# Patient Record
Sex: Male | Born: 1940 | Race: White | Hispanic: No | Marital: Single | Smoking: Former smoker
Health system: Southern US, Community
[De-identification: ages and names within clinical notes are randomized; demographics above are authoritative.]

## PROBLEM LIST (undated history)

## (undated) DIAGNOSIS — R1310 Dysphagia, unspecified: Secondary | ICD-10-CM

## (undated) DIAGNOSIS — H53039 Strabismic amblyopia, unspecified eye: Secondary | ICD-10-CM

## (undated) DIAGNOSIS — G2581 Restless legs syndrome: Secondary | ICD-10-CM

## (undated) DIAGNOSIS — G904 Autonomic dysreflexia: Secondary | ICD-10-CM

## (undated) DIAGNOSIS — Z8679 Personal history of other diseases of the circulatory system: Secondary | ICD-10-CM

## (undated) DIAGNOSIS — I209 Angina pectoris, unspecified: Secondary | ICD-10-CM

## (undated) DIAGNOSIS — E119 Type 2 diabetes mellitus without complications: Secondary | ICD-10-CM

## (undated) DIAGNOSIS — R609 Edema, unspecified: Secondary | ICD-10-CM

## (undated) DIAGNOSIS — N189 Chronic kidney disease, unspecified: Secondary | ICD-10-CM

## (undated) DIAGNOSIS — G20A1 Parkinson's disease without dyskinesia, without mention of fluctuations: Secondary | ICD-10-CM

## (undated) DIAGNOSIS — J309 Allergic rhinitis, unspecified: Secondary | ICD-10-CM

## (undated) DIAGNOSIS — I2510 Atherosclerotic heart disease of native coronary artery without angina pectoris: Secondary | ICD-10-CM

## (undated) DIAGNOSIS — R569 Unspecified convulsions: Secondary | ICD-10-CM

## (undated) DIAGNOSIS — J449 Chronic obstructive pulmonary disease, unspecified: Secondary | ICD-10-CM

## (undated) DIAGNOSIS — I739 Peripheral vascular disease, unspecified: Secondary | ICD-10-CM

## (undated) DIAGNOSIS — G4730 Sleep apnea, unspecified: Secondary | ICD-10-CM

## (undated) DIAGNOSIS — M549 Dorsalgia, unspecified: Secondary | ICD-10-CM

## (undated) DIAGNOSIS — I499 Cardiac arrhythmia, unspecified: Secondary | ICD-10-CM

## (undated) DIAGNOSIS — M1990 Unspecified osteoarthritis, unspecified site: Secondary | ICD-10-CM

## (undated) DIAGNOSIS — E7800 Pure hypercholesterolemia, unspecified: Secondary | ICD-10-CM

## (undated) DIAGNOSIS — G20 Parkinson's disease: Secondary | ICD-10-CM

## (undated) DIAGNOSIS — G629 Polyneuropathy, unspecified: Secondary | ICD-10-CM

## (undated) DIAGNOSIS — H409 Unspecified glaucoma: Secondary | ICD-10-CM

## (undated) DIAGNOSIS — F32A Depression, unspecified: Secondary | ICD-10-CM

## (undated) DIAGNOSIS — K219 Gastro-esophageal reflux disease without esophagitis: Secondary | ICD-10-CM

## (undated) DIAGNOSIS — M4800 Spinal stenosis, site unspecified: Secondary | ICD-10-CM

## (undated) DIAGNOSIS — F17200 Nicotine dependence, unspecified, uncomplicated: Secondary | ICD-10-CM

## (undated) DIAGNOSIS — H9319 Tinnitus, unspecified ear: Secondary | ICD-10-CM

## (undated) DIAGNOSIS — I219 Acute myocardial infarction, unspecified: Secondary | ICD-10-CM

## (undated) DIAGNOSIS — J189 Pneumonia, unspecified organism: Secondary | ICD-10-CM

## (undated) DIAGNOSIS — I509 Heart failure, unspecified: Secondary | ICD-10-CM

## (undated) DIAGNOSIS — F329 Major depressive disorder, single episode, unspecified: Secondary | ICD-10-CM

## (undated) HISTORY — PX: FRACTURE SURGERY: SHX138

## (undated) HISTORY — PX: OTHER SURGICAL HISTORY: SHX169

## (undated) HISTORY — PX: APPENDECTOMY: SHX54

## (undated) HISTORY — PX: EYE SURGERY: SHX253

## (undated) HISTORY — PX: BACK SURGERY: SHX140

## (undated) HISTORY — PX: CORONARY ANGIOPLASTY: SHX604

## (undated) HISTORY — PX: ANKLE SURGERY: SHX546

---

## 1992-10-23 DIAGNOSIS — L301 Dyshidrosis [pompholyx]: Secondary | ICD-10-CM | POA: Insufficient documentation

## 1998-05-19 DIAGNOSIS — I219 Acute myocardial infarction, unspecified: Secondary | ICD-10-CM

## 1998-05-19 HISTORY — DX: Acute myocardial infarction, unspecified: I21.9

## 2004-03-21 DIAGNOSIS — E7800 Pure hypercholesterolemia, unspecified: Secondary | ICD-10-CM | POA: Insufficient documentation

## 2005-01-17 DIAGNOSIS — G250 Essential tremor: Secondary | ICD-10-CM | POA: Insufficient documentation

## 2005-03-20 DIAGNOSIS — M4802 Spinal stenosis, cervical region: Secondary | ICD-10-CM | POA: Insufficient documentation

## 2008-05-05 DIAGNOSIS — E559 Vitamin D deficiency, unspecified: Secondary | ICD-10-CM | POA: Insufficient documentation

## 2009-08-01 ENCOUNTER — Encounter: Payer: Self-pay | Admitting: Internal Medicine

## 2009-08-13 ENCOUNTER — Inpatient Hospital Stay: Payer: Self-pay | Admitting: Student

## 2009-08-17 ENCOUNTER — Encounter: Payer: Self-pay | Admitting: Internal Medicine

## 2009-09-12 DIAGNOSIS — G9340 Encephalopathy, unspecified: Secondary | ICD-10-CM | POA: Insufficient documentation

## 2010-05-27 ENCOUNTER — Ambulatory Visit: Payer: Self-pay | Admitting: Family Medicine

## 2010-07-24 ENCOUNTER — Ambulatory Visit: Payer: Self-pay | Admitting: Ophthalmology

## 2010-09-15 ENCOUNTER — Inpatient Hospital Stay: Payer: Self-pay | Admitting: Specialist

## 2011-02-25 ENCOUNTER — Ambulatory Visit: Payer: Self-pay | Admitting: Pain Medicine

## 2011-03-05 ENCOUNTER — Ambulatory Visit: Payer: Self-pay | Admitting: Pain Medicine

## 2011-03-12 ENCOUNTER — Ambulatory Visit: Payer: Self-pay | Admitting: Pain Medicine

## 2011-04-08 ENCOUNTER — Ambulatory Visit: Payer: Self-pay | Admitting: Pain Medicine

## 2011-04-16 ENCOUNTER — Ambulatory Visit: Payer: Self-pay | Admitting: Pain Medicine

## 2011-04-28 ENCOUNTER — Inpatient Hospital Stay: Payer: Self-pay | Admitting: Internal Medicine

## 2011-05-27 ENCOUNTER — Ambulatory Visit: Payer: Self-pay | Admitting: Pain Medicine

## 2011-06-16 ENCOUNTER — Ambulatory Visit: Payer: Self-pay | Admitting: Pain Medicine

## 2011-07-15 ENCOUNTER — Ambulatory Visit: Payer: Self-pay | Admitting: Pain Medicine

## 2011-07-24 ENCOUNTER — Ambulatory Visit: Payer: Self-pay | Admitting: Pain Medicine

## 2011-08-11 ENCOUNTER — Ambulatory Visit: Payer: Self-pay | Admitting: Pain Medicine

## 2011-09-11 ENCOUNTER — Ambulatory Visit: Payer: Self-pay | Admitting: Pain Medicine

## 2011-09-29 ENCOUNTER — Ambulatory Visit: Payer: Self-pay | Admitting: Pain Medicine

## 2011-10-03 ENCOUNTER — Ambulatory Visit: Payer: Self-pay | Admitting: Orthopedic Surgery

## 2011-10-03 DIAGNOSIS — E119 Type 2 diabetes mellitus without complications: Secondary | ICD-10-CM

## 2011-10-03 LAB — CBC WITH DIFFERENTIAL/PLATELET
Basophil %: 0.8 %
HCT: 37.8 % — ABNORMAL LOW (ref 40.0–52.0)
HGB: 12.3 g/dL — ABNORMAL LOW (ref 13.0–18.0)
Lymphocyte #: 2.7 10*3/uL (ref 1.0–3.6)
MCH: 28.4 pg (ref 26.0–34.0)
MCV: 88 fL (ref 80–100)
Neutrophil #: 4.8 10*3/uL (ref 1.4–6.5)
Neutrophil %: 54.7 %
Platelet: 240 10*3/uL (ref 150–440)
RBC: 4.31 10*6/uL — ABNORMAL LOW (ref 4.40–5.90)
RDW: 15.4 % — ABNORMAL HIGH (ref 11.5–14.5)
WBC: 8.7 10*3/uL (ref 3.8–10.6)

## 2011-10-03 LAB — BASIC METABOLIC PANEL
BUN: 25 mg/dL — ABNORMAL HIGH (ref 7–18)
Calcium, Total: 8.3 mg/dL — ABNORMAL LOW (ref 8.5–10.1)
Co2: 26 mmol/L (ref 21–32)
Creatinine: 1.25 mg/dL (ref 0.60–1.30)
EGFR (African American): >60
EGFR (Non-African Amer.): 58 — ABNORMAL LOW
Osmolality: 286 (ref 275–301)
Potassium: 4.5 mmol/L (ref 3.5–5.1)

## 2011-10-28 ENCOUNTER — Ambulatory Visit: Payer: Self-pay | Admitting: Orthopedic Surgery

## 2011-11-18 ENCOUNTER — Inpatient Hospital Stay: Payer: Self-pay | Admitting: Internal Medicine

## 2011-11-18 LAB — BASIC METABOLIC PANEL
Anion Gap: 10 (ref 7–16)
Creatinine: 1.48 mg/dL — ABNORMAL HIGH (ref 0.60–1.30)
EGFR (African American): 54 — ABNORMAL LOW
EGFR (Non-African Amer.): 47 — ABNORMAL LOW
Glucose: 173 mg/dL — ABNORMAL HIGH (ref 65–99)
Sodium: 134 mmol/L — ABNORMAL LOW (ref 136–145)

## 2011-11-18 LAB — CBC
HCT: 39.1 % — ABNORMAL LOW (ref 40.0–52.0)
HGB: 12.6 g/dL — ABNORMAL LOW (ref 13.0–18.0)
MCH: 28.3 pg (ref 26.0–34.0)
MCV: 88 fL (ref 80–100)
RBC: 4.44 10*6/uL (ref 4.40–5.90)
WBC: 13.7 10*3/uL — ABNORMAL HIGH (ref 3.8–10.6)

## 2011-11-18 LAB — TROPONIN I: Troponin-I: <0.02 ng/mL

## 2011-11-19 LAB — LIPID PANEL
Cholesterol: 88 mg/dL (ref 0–200)
Ldl Cholesterol, Calc: 10 mg/dL (ref 0–100)
Triglycerides: 260 mg/dL — ABNORMAL HIGH (ref 0–200)
VLDL Cholesterol, Calc: 52 mg/dL — ABNORMAL HIGH (ref 5–40)

## 2011-11-19 LAB — BASIC METABOLIC PANEL
Anion Gap: 6 — ABNORMAL LOW (ref 7–16)
BUN: 16 mg/dL (ref 7–18)
Calcium, Total: 8.5 mg/dL (ref 8.5–10.1)
Co2: 30 mmol/L (ref 21–32)
Creatinine: 1.28 mg/dL (ref 0.60–1.30)
EGFR (Non-African Amer.): 56 — ABNORMAL LOW
Potassium: 3.9 mmol/L (ref 3.5–5.1)
Sodium: 138 mmol/L (ref 136–145)

## 2011-11-19 LAB — CBC WITH DIFFERENTIAL/PLATELET
Basophil %: 0.8 %
Eosinophil %: 1.8 %
Lymphocyte %: 27.6 %
MCH: 28.5 pg (ref 26.0–34.0)
MCV: 87 fL (ref 80–100)
Monocyte #: 1.3 x10 3/mm — ABNORMAL HIGH (ref 0.2–1.0)
Monocyte %: 15 %
Neutrophil %: 54.8 %
Platelet: 189 10*3/uL (ref 150–440)

## 2011-11-19 LAB — TSH: Thyroid Stimulating Horm: 2.65 u[IU]/mL

## 2011-11-20 LAB — BASIC METABOLIC PANEL
Anion Gap: 7 (ref 7–16)
BUN: 15 mg/dL (ref 7–18)
Calcium, Total: 8.6 mg/dL (ref 8.5–10.1)
Chloride: 105 mmol/L (ref 98–107)
Co2: 29 mmol/L (ref 21–32)
Creatinine: 1.31 mg/dL — ABNORMAL HIGH (ref 0.60–1.30)
EGFR (African American): >60
EGFR (Non-African Amer.): 54 — ABNORMAL LOW
Osmolality: 283 (ref 275–301)

## 2011-11-20 LAB — HEMOGLOBIN A1C: Hemoglobin A1C: 6.9 % — ABNORMAL HIGH (ref 4.2–6.3)

## 2011-11-20 LAB — HEMOGLOBIN: HGB: 11.6 g/dL — ABNORMAL LOW (ref 13.0–18.0)

## 2011-11-22 LAB — BASIC METABOLIC PANEL
Calcium, Total: 9.3 mg/dL (ref 8.5–10.1)
Co2: 24 mmol/L (ref 21–32)
EGFR (Non-African Amer.): 56 — ABNORMAL LOW
Glucose: 243 mg/dL — ABNORMAL HIGH (ref 65–99)
Osmolality: 286 (ref 275–301)
Potassium: 4.2 mmol/L (ref 3.5–5.1)

## 2011-11-22 LAB — EXPECTORATED SPUTUM ASSESSMENT W GRAM STAIN, RFLX TO RESP C

## 2011-11-24 LAB — CULTURE, BLOOD (SINGLE)

## 2011-11-27 ENCOUNTER — Ambulatory Visit: Payer: Self-pay | Admitting: Pain Medicine

## 2011-12-22 ENCOUNTER — Ambulatory Visit: Payer: Self-pay | Admitting: Pain Medicine

## 2012-01-22 ENCOUNTER — Ambulatory Visit: Payer: Self-pay | Admitting: Pain Medicine

## 2012-02-02 ENCOUNTER — Ambulatory Visit: Payer: Self-pay | Admitting: Pain Medicine

## 2012-10-01 ENCOUNTER — Ambulatory Visit: Payer: Self-pay | Admitting: Family

## 2013-03-08 ENCOUNTER — Ambulatory Visit: Payer: Self-pay | Admitting: Pain Medicine

## 2013-03-28 ENCOUNTER — Ambulatory Visit: Payer: Self-pay | Admitting: Pain Medicine

## 2013-04-05 ENCOUNTER — Observation Stay: Payer: Self-pay | Admitting: Internal Medicine

## 2013-04-05 LAB — TROPONIN I: Troponin-I: <0.02 ng/mL

## 2013-04-05 LAB — BASIC METABOLIC PANEL
Anion Gap: 5 — ABNORMAL LOW (ref 7–16)
BUN: 20 mg/dL — ABNORMAL HIGH (ref 7–18)
Calcium, Total: 8.3 mg/dL — ABNORMAL LOW (ref 8.5–10.1)
Chloride: 102 mmol/L (ref 98–107)
Co2: 27 mmol/L (ref 21–32)
Creatinine: 1.22 mg/dL (ref 0.60–1.30)
Osmolality: 276 (ref 275–301)

## 2013-04-05 LAB — CBC
HGB: 12.5 g/dL — ABNORMAL LOW (ref 13.0–18.0)
MCH: 27.3 pg (ref 26.0–34.0)
MCV: 83 fL (ref 80–100)
RBC: 4.58 10*6/uL (ref 4.40–5.90)

## 2013-04-05 LAB — PRO B NATRIURETIC PEPTIDE: B-Type Natriuretic Peptide: 82 pg/mL (ref 0–125)

## 2013-04-06 LAB — TROPONIN I: Troponin-I: <0.02 ng/mL

## 2013-04-22 ENCOUNTER — Emergency Department: Payer: Self-pay | Admitting: Emergency Medicine

## 2013-04-22 LAB — BASIC METABOLIC PANEL
BUN: 18 mg/dL (ref 7–18)
Calcium, Total: 8.8 mg/dL (ref 8.5–10.1)
Chloride: 102 mmol/L (ref 98–107)
Co2: 28 mmol/L (ref 21–32)
EGFR (African American): >60
Glucose: 215 mg/dL — ABNORMAL HIGH (ref 65–99)

## 2013-04-22 LAB — CBC
HCT: 39.7 % — ABNORMAL LOW (ref 40.0–52.0)
HGB: 13 g/dL (ref 13.0–18.0)
MCH: 27.3 pg (ref 26.0–34.0)
MCHC: 32.7 g/dL (ref 32.0–36.0)
MCV: 84 fL (ref 80–100)
Platelet: 218 10*3/uL (ref 150–440)
RBC: 4.75 10*6/uL (ref 4.40–5.90)
WBC: 8.6 10*3/uL (ref 3.8–10.6)

## 2013-04-22 LAB — CK: CK, Total: 105 U/L (ref 35–232)

## 2013-04-22 LAB — TROPONIN I: Troponin-I: <0.02 ng/mL

## 2013-04-28 ENCOUNTER — Ambulatory Visit: Payer: Self-pay | Admitting: Pain Medicine

## 2013-05-16 ENCOUNTER — Ambulatory Visit: Payer: Self-pay | Admitting: Pain Medicine

## 2013-07-11 DIAGNOSIS — Z955 Presence of coronary angioplasty implant and graft: Secondary | ICD-10-CM | POA: Insufficient documentation

## 2013-07-26 ENCOUNTER — Encounter: Payer: Self-pay | Admitting: Cardiology

## 2013-08-15 ENCOUNTER — Inpatient Hospital Stay: Payer: Self-pay | Admitting: Specialist

## 2013-08-15 LAB — CBC
HCT: 43.3 % (ref 40.0–52.0)
HGB: 13.6 g/dL (ref 13.0–18.0)
MCH: 26.5 pg (ref 26.0–34.0)
MCHC: 31.5 g/dL — AB (ref 32.0–36.0)
MCV: 84 fL (ref 80–100)
Platelet: 270 10*3/uL (ref 150–440)
RBC: 5.14 10*6/uL (ref 4.40–5.90)
RDW: 15.4 % — ABNORMAL HIGH (ref 11.5–14.5)
WBC: 11.7 10*3/uL — ABNORMAL HIGH (ref 3.8–10.6)

## 2013-08-15 LAB — BASIC METABOLIC PANEL
Anion Gap: 6 — ABNORMAL LOW (ref 7–16)
BUN: 14 mg/dL (ref 7–18)
CHLORIDE: 100 mmol/L (ref 98–107)
CO2: 28 mmol/L (ref 21–32)
CREATININE: 1.27 mg/dL (ref 0.60–1.30)
Calcium, Total: 9 mg/dL (ref 8.5–10.1)
EGFR (Non-African Amer.): 56 — ABNORMAL LOW
GLUCOSE: 297 mg/dL — AB (ref 65–99)
Osmolality: 280 (ref 275–301)
Potassium: 4.5 mmol/L (ref 3.5–5.1)
SODIUM: 134 mmol/L — AB (ref 136–145)

## 2013-08-15 LAB — TROPONIN I
TROPONIN-I: 0.05 ng/mL
Troponin-I: <0.02 ng/mL

## 2013-08-16 LAB — CBC WITH DIFFERENTIAL/PLATELET
BASOS ABS: 0.1 10*3/uL (ref 0.0–0.1)
Basophil %: 1.3 %
EOS PCT: 3.9 %
Eosinophil #: 0.4 10*3/uL (ref 0.0–0.7)
HCT: 35.6 % — AB (ref 40.0–52.0)
HGB: 11.6 g/dL — ABNORMAL LOW (ref 13.0–18.0)
Lymphocyte #: 3.8 10*3/uL — ABNORMAL HIGH (ref 1.0–3.6)
Lymphocyte %: 39.5 %
MCH: 27 pg (ref 26.0–34.0)
MCHC: 32.5 g/dL (ref 32.0–36.0)
MCV: 83 fL (ref 80–100)
MONO ABS: 1 x10 3/mm (ref 0.2–1.0)
Monocyte %: 10 %
NEUTROS ABS: 4.4 10*3/uL (ref 1.4–6.5)
Neutrophil %: 45.3 %
Platelet: 233 10*3/uL (ref 150–440)
RBC: 4.29 10*6/uL — ABNORMAL LOW (ref 4.40–5.90)
RDW: 15.5 % — AB (ref 11.5–14.5)
WBC: 9.6 10*3/uL (ref 3.8–10.6)

## 2013-08-16 LAB — BASIC METABOLIC PANEL
Anion Gap: 6 — ABNORMAL LOW (ref 7–16)
BUN: 18 mg/dL (ref 7–18)
CALCIUM: 8.3 mg/dL — AB (ref 8.5–10.1)
Chloride: 102 mmol/L (ref 98–107)
Co2: 27 mmol/L (ref 21–32)
Creatinine: 1.28 mg/dL (ref 0.60–1.30)
EGFR (African American): >60
EGFR (Non-African Amer.): 55 — ABNORMAL LOW
Glucose: 244 mg/dL — ABNORMAL HIGH (ref 65–99)
OSMOLALITY: 280 (ref 275–301)
POTASSIUM: 3.6 mmol/L (ref 3.5–5.1)
Sodium: 135 mmol/L — ABNORMAL LOW (ref 136–145)

## 2013-08-16 LAB — LIPID PANEL
Cholesterol: 131 mg/dL (ref 0–200)
HDL Cholesterol: 19 mg/dL — ABNORMAL LOW (ref 40–60)
TRIGLYCERIDES: 597 mg/dL — AB (ref 0–200)

## 2013-08-17 ENCOUNTER — Encounter: Payer: Self-pay | Admitting: Cardiology

## 2013-09-16 ENCOUNTER — Encounter: Payer: Self-pay | Admitting: Cardiology

## 2013-10-17 ENCOUNTER — Encounter: Payer: Self-pay | Admitting: Cardiology

## 2013-12-07 ENCOUNTER — Inpatient Hospital Stay: Payer: Self-pay | Admitting: Specialist

## 2013-12-07 LAB — URINALYSIS, COMPLETE
BACTERIA: NONE SEEN
BILIRUBIN, UR: NEGATIVE
BLOOD: NEGATIVE
Glucose,UR: >=500 mg/dL (ref 0–75)
LEUKOCYTE ESTERASE: NEGATIVE
Nitrite: NEGATIVE
Ph: 7 (ref 4.5–8.0)
Protein: NEGATIVE
RBC,UR: 1 /HPF (ref 0–5)
Specific Gravity: 1.015 (ref 1.003–1.030)
Squamous Epithelial: NONE SEEN

## 2013-12-07 LAB — CBC WITH DIFFERENTIAL/PLATELET
BASOS ABS: 0.1 10*3/uL (ref 0.0–0.1)
Basophil %: 0.5 %
Eosinophil #: 0.1 10*3/uL (ref 0.0–0.7)
Eosinophil %: 0.4 %
HCT: 35.5 % — AB (ref 40.0–52.0)
HGB: 11.1 g/dL — ABNORMAL LOW (ref 13.0–18.0)
LYMPHS ABS: 2.5 10*3/uL (ref 1.0–3.6)
Lymphocyte %: 17.5 %
MCH: 25.8 pg — ABNORMAL LOW (ref 26.0–34.0)
MCHC: 31.4 g/dL — AB (ref 32.0–36.0)
MCV: 82 fL (ref 80–100)
MONO ABS: 1.7 x10 3/mm — AB (ref 0.2–1.0)
MONOS PCT: 11.8 %
Neutrophil #: 10.1 10*3/uL — ABNORMAL HIGH (ref 1.4–6.5)
Neutrophil %: 69.8 %
PLATELETS: 218 10*3/uL (ref 150–440)
RBC: 4.32 10*6/uL — AB (ref 4.40–5.90)
RDW: 15.9 % — AB (ref 11.5–14.5)
WBC: 14.5 10*3/uL — ABNORMAL HIGH (ref 3.8–10.6)

## 2013-12-07 LAB — BASIC METABOLIC PANEL
Anion Gap: 8 (ref 7–16)
BUN: 8 mg/dL (ref 7–18)
CHLORIDE: 106 mmol/L (ref 98–107)
CO2: 26 mmol/L (ref 21–32)
Calcium, Total: 8.3 mg/dL — ABNORMAL LOW (ref 8.5–10.1)
Creatinine: 0.98 mg/dL (ref 0.60–1.30)
EGFR (African American): >60
EGFR (Non-African Amer.): >60
Glucose: 100 mg/dL — ABNORMAL HIGH (ref 65–99)
Osmolality: 278 (ref 275–301)
POTASSIUM: 3.7 mmol/L (ref 3.5–5.1)
SODIUM: 140 mmol/L (ref 136–145)

## 2013-12-07 LAB — HEPATIC FUNCTION PANEL A (ARMC)
ALK PHOS: 57 U/L
AST: 23 U/L (ref 15–37)
Albumin: 3.1 g/dL — ABNORMAL LOW (ref 3.4–5.0)
Bilirubin, Direct: 0.1 mg/dL (ref 0.00–0.20)
Bilirubin,Total: 0.7 mg/dL (ref 0.2–1.0)
SGPT (ALT): 28 U/L
TOTAL PROTEIN: 6.9 g/dL (ref 6.4–8.2)

## 2013-12-07 LAB — D-DIMER(ARMC): D-Dimer: 535 ng/ml

## 2013-12-07 LAB — TROPONIN I: Troponin-I: <0.02 ng/mL

## 2013-12-08 LAB — CBC WITH DIFFERENTIAL/PLATELET
Basophil #: 0.1 10*3/uL (ref 0.0–0.1)
Basophil %: 0.7 %
EOS PCT: 0.6 %
Eosinophil #: 0.1 10*3/uL (ref 0.0–0.7)
HCT: 35.4 % — ABNORMAL LOW (ref 40.0–52.0)
HGB: 11 g/dL — AB (ref 13.0–18.0)
Lymphocyte #: 3.1 10*3/uL (ref 1.0–3.6)
Lymphocyte %: 20.9 %
MCH: 25.5 pg — ABNORMAL LOW (ref 26.0–34.0)
MCHC: 31.1 g/dL — AB (ref 32.0–36.0)
MCV: 82 fL (ref 80–100)
MONOS PCT: 12.7 %
Monocyte #: 1.9 x10 3/mm — ABNORMAL HIGH (ref 0.2–1.0)
NEUTROS ABS: 9.6 10*3/uL — AB (ref 1.4–6.5)
Neutrophil %: 65.1 %
PLATELETS: 210 10*3/uL (ref 150–440)
RBC: 4.31 10*6/uL — ABNORMAL LOW (ref 4.40–5.90)
RDW: 16.2 % — ABNORMAL HIGH (ref 11.5–14.5)
WBC: 14.7 10*3/uL — AB (ref 3.8–10.6)

## 2013-12-08 LAB — BASIC METABOLIC PANEL
ANION GAP: 8 (ref 7–16)
BUN: 12 mg/dL (ref 7–18)
CHLORIDE: 103 mmol/L (ref 98–107)
CREATININE: 1.16 mg/dL (ref 0.60–1.30)
Calcium, Total: 8.2 mg/dL — ABNORMAL LOW (ref 8.5–10.1)
Co2: 27 mmol/L (ref 21–32)
EGFR (Non-African Amer.): >60
GLUCOSE: 203 mg/dL — AB (ref 65–99)
OSMOLALITY: 281 (ref 275–301)
Potassium: 3.9 mmol/L (ref 3.5–5.1)
Sodium: 138 mmol/L (ref 136–145)

## 2013-12-09 LAB — CBC WITH DIFFERENTIAL/PLATELET
BASOS PCT: 0.8 %
Basophil #: 0.1 10*3/uL (ref 0.0–0.1)
EOS ABS: 0.1 10*3/uL (ref 0.0–0.7)
Eosinophil %: 1.1 %
HCT: 35.9 % — ABNORMAL LOW (ref 40.0–52.0)
HGB: 11.2 g/dL — ABNORMAL LOW (ref 13.0–18.0)
LYMPHS ABS: 2.7 10*3/uL (ref 1.0–3.6)
Lymphocyte %: 22.1 %
MCH: 25.7 pg — ABNORMAL LOW (ref 26.0–34.0)
MCHC: 31.1 g/dL — AB (ref 32.0–36.0)
MCV: 83 fL (ref 80–100)
MONOS PCT: 13.2 %
Monocyte #: 1.6 x10 3/mm — ABNORMAL HIGH (ref 0.2–1.0)
NEUTROS ABS: 7.7 10*3/uL — AB (ref 1.4–6.5)
Neutrophil %: 62.8 %
Platelet: 221 10*3/uL (ref 150–440)
RBC: 4.34 10*6/uL — ABNORMAL LOW (ref 4.40–5.90)
RDW: 16.7 % — ABNORMAL HIGH (ref 11.5–14.5)
WBC: 12.3 10*3/uL — ABNORMAL HIGH (ref 3.8–10.6)

## 2013-12-12 LAB — CULTURE, BLOOD (SINGLE)

## 2014-06-13 ENCOUNTER — Ambulatory Visit: Payer: Self-pay | Admitting: Family

## 2014-08-07 ENCOUNTER — Ambulatory Visit: Payer: Self-pay | Admitting: Ophthalmology

## 2014-08-29 ENCOUNTER — Ambulatory Visit
Admit: 2014-08-29 | Disposition: A | Discharge status: Home / Self Care | Payer: Self-pay | Attending: Family | Admitting: Family

## 2014-09-08 NOTE — Discharge Summary (Signed)
PATIENT NAME:  Jimmy Hill, Jimmy MR#:  [id number] DATE OF BIRTH:  Jan 04, 1941  DATE OF ADMISSION:  04/05/2013 DATE OF DISCHARGE:  04/06/2013  DISCHARGE DIAGNOSIS: Chest pain, likely noncardiac with negative serial cardiac enzymes. It had more pleuritic nature and/or muscular etiology. Will require outpatient stress test if felt appropriate by his cardiologist.   SECONDARY DIAGNOSES: 1.  Coronary artery disease status post stenting. 2.  Diabetes.  3.  Hypertension. 4.  Chronic obstructive pulmonary disease.  5.  Parkinson disease.  6.  Depression. 7.  Hyperlipidemia.  8.  Glaucoma.  9.  Chronic back pain.   CONSULTATIONS: None.   PROCEDURES AND RADIOLOGY: CT scan of the chest with PE protocol on 11/18 showed no evidence of PE. Coronary calcification. Possible chronic scarring at the bases at the right lower lobe.   Chest x-ray on 11/18 showed no acute cardiopulmonary disease.   HISTORY AND SHORT HOSPITAL COURSE: The patient is a 74 year old male with the above-mentioned medical problems, who was admitted for chest pain. Please see Dr. Edward Jolly dictated history and physical for further details. The patient was ruled out with 3 negative sets of troponin. His chest pain was more consistent with more muscular or pleuritic etiology/pleurisy, and was instructed to follow up with his outpatient cardiology at [facility], when he could be considered for stress test. He was also instructed to use nonsteroidal anti-inflammatory medication if okay with his primary care physician, for his pain. The patient's chest pain was significantly improved on the date of discharge, and he was discharged home in stable condition. On the date of discharge, his vital signs were as follows: Temperature 97.7, heart rate 84 per minute, respirations 20 per minute, blood pressure 111/72 mmHg. He was saturating 93% to 94% on room air.   PERTINENT PHYSICAL EXAMINATION ON THE DATE OF DISCHARGE:  CARDIOVASCULAR: S1, S2 normal. No  murmurs, rubs or gallops.  LUNGS: Clear to auscultation bilaterally. No wheezing, rales, rhonchi or crepitation.  ABDOMEN: Soft, benign.  NEUROLOGIC: Nonfocal examination.  All other physical examination remained at baseline.   DISCHARGE MEDICATIONS: 1.  Glipizide 5 mg p.o. daily. 2.  Neurontin 800 mg p.o. 3 times a day.  3.  Lipitor 80 mg p.o. at bedtime.  4.  Hydrocortisone topical 2.5% to affected area twice a day as needed.  5.  Isosorbide dinitrate 10 mg 1/2 tablet p.o. b.i.d.  6.  Optive eye drops 1 to 2 drops to each eye 3 times a day as needed.  7.  Metformin 1000 mg 2 tablets p.o. daily.  8.  Wellbutrin 300 mg p.o. daily. 9.  Nystatin/triamcinolone topically to affected area twice a day.  10.  Chloraseptic spray to throat area every two hours as needed.  11.  Lumigan 0.01% ophthalmic solution to each eye at bedtime.  12.  Mucinex 600 mg p.o. b.i.d. as needed.  13.  DuoNeb inhaled 4 times a day.  14.  Sinemet 25/100, 1 tablet p.o. 4 times a day.  15.  Imodium 2 tablets p.o. after each loose stool and then 1 tablet every six hours as needed.  16.  Ibuprofen 400 mg p.o. every six hours as needed.   DISCHARGE DIET: Low sodium.   DISCHARGE ACTIVITY: As tolerated.   DISCHARGE INSTRUCTIONS AND FOLLOW-UP: The patient was instructed to follow up with his primary care physician at [facility] in one to two days. He will need follow-up with [facility] cardiology in 1 to 2 weeks.   I did discuss this discharge plan  with his primary care physician at [facility], and they were in agreement with the discharge plan.   TOTAL TIME DISCHARGING THIS PATIENT: 45 minutes.   ____________________________ Lucina Mellow. Manuella Ghazi, MD vss:cg D: 04/06/2013 21:51:10 ET T: 04/06/2013 22:22:15 ET JOB#: [id number]  cc: Adylynn Hertenstein S. Manuella Ghazi, MD, <Dictator> [facility] Lucina Mellow [facility] MD ELECTRONICALLY SIGNED 04/07/2013 16:24

## 2014-09-08 NOTE — H&P (Signed)
PATIENT NAME:  Jimmy Hill, Jimmy Hill MR#:  [id number] DATE OF BIRTH:  March 05, 1941  DATE OF ADMISSION:  04/05/2013  PRIMARY CARE PHYSICIAN: Danelle Berry. Derrek Monaco, MD, at [employer].   CHIEF COMPLAINT: Chest pain.   HISTORY OF PRESENT ILLNESS: This is a 74 year old male who presents to the hospital with substernal chest pain that began earlier this evening. The patient describes the pain as being sharp but pressure-like in nature, located in the left side of his chest, nonradiating, associated with some nausea and some shortness of breath and diaphoresis. The patient says his pain was very similar to when he had his heart attack about 10+ years ago. The patient, therefore, came to the ER for further evaluation. The patient does have a history of chronic pain, is followed by the pain clinic by Dr. Primus Bravo but that is more for his back and shoulder pain but not chest pain. The patient does have significant risk factors given previous history of coronary artery disease, diabetes, hyperlipidemia, history of long-term tobacco abuse. His symptoms were suspicious for angina and, therefore, hospitalist services were contacted for further treatment and evaluation.   REVIEW OF SYSTEMS:  CONSTITUTIONAL: No documented fever. No weight gain, no weight loss.  EYES: No blurred or double vision.  ENT: No tinnitus. No postnasal drip. No redness of the oropharynx.  RESPIRATORY: No cough, no wheeze, no hemoptysis, no dyspnea. Positive COPD.  CARDIOVASCULAR: Positive chest pain. No orthopnea. No palpitations. No syncope.  GASTROINTESTINAL: No nausea, no vomiting, no diarrhea. No abdominal pain. No melena or hematochezia.  GENITOURINARY: No dysuria or hematuria.  ENDOCRINE: No polyuria, nocturia, heat or cold intolerance.  HEMATOLOGIC: No anemia. No bruising. No bleeding.  INTEGUMENTARY: No rashes. No lesions.  MUSCULOSKELETAL: No arthritis, no swelling, no gout.  NEUROLOGIC: No numbness or tingling. No ataxia. No  seizure-type activity. Positive Parkinson's disease.  PSYCHIATRIC: Positive depression. No anxiety. No ADD.   PAST MEDICAL HISTORY: Consistent with a history of coronary artery disease, status post stent placement, diabetes, hypertension, COPD, history of Parkinson's disease, depression, hyperlipidemia, glaucoma, chronic back pain.   ALLERGIES: No known drug allergies.   SOCIAL HISTORY: Does have a long-term tobacco abuse history, about 40 to 50 years but quit about a year or so ago. No alcohol abuse. No illicit drug abuse. Lives at home with his wife.   FAMILY HISTORY: Both mother and father are deceased. Father died from complications of heart disease. Mother died from old age.   CURRENT MEDICATIONS: Advair 250/50 one puff b.i.d.,  cetirizine in 10 mg daily as needed, Chloraseptic spray 4 times daily as needed, DuoNebs 4 times daily as needed, Flonase 1 spray to each nostril daily, glipizide 5 mg daily, hydrocortisone topical 2.5% to be applied b.i.d. as needed, Imodium AD 2 tabs 4 times daily as needed for diarrhea, isosorbide dinitrate 10 mg 1/2 tab b.i.d., Lipitor 80 mg daily, Lumigan 0.01% ophthalmic solution at bedtime, metformin 2000 mg daily, Mucinex 600 mg b.i.d., Neurontin 800 mg t.i.d., nystatin/triamcinolone topical ointment to be applied b.i.d., Sinemet 25/100 one tab 4 times daily, Spiriva 1 puff daily and Wellbutrin 300 mg daily.   PHYSICAL EXAMINATION: Presently is as follows:  VITAL SIGNS:  Temperature is 97.8, pulse 83, respirations 16, blood pressure 140/79, sats 96% on room air.  GENERAL: He is a pleasant-appearing male in no apparent distress.  HEAD, EYES, EARS, NOSE THROAT: He is atraumatic, normocephalic. His extraocular muscles are intact. His pupils are equal and reactive and reactive to light. His sclerae  is anicteric. No conjunctival injection. No oropharyngeal erythema.  NECK: Supple. There is no jugular venous distention. No bruits. No lymphadenopathy or thyromegaly.   HEART: Regular rate and rhythm. No murmurs. No rubs. No clicks.  LUNGS: He has prolonged inspiratory and expiratory phase. Positive end-expiratory wheezing. No rales, no rhonchi. Negative use of accessory muscles.  ABDOMEN: Soft, flat, nontender, nondistended. Has good bowel sounds. No hepatosplenomegaly appreciated.  EXTREMITIES: No evidence of any cyanosis, clubbing or peripheral edema. Has +2 pedal and radial pulses bilaterally.  NEUROLOGICAL: The patient is alert, awake, and oriented x 3 with no focal motor or sensory deficits appreciated bilaterally.  SKIN: Moist and warm with no rashes appreciated.  LYMPHATIC: There is no cervical or axillary adenopathy.   LABORATORY DATA: Serum glucose 185, BUN 20, creatinine 1.2, sodium 134, potassium 4.2, chloride 102, bicarbonate 27. Troponin less than 0.02. White cell count 10.9, hemoglobin 12.5, hematocrit 38.2, platelet count 229.   IMAGING:  The patient did have a chest x-ray done which showed no acute cardiopulmonary disease. The patient also underwent a CT scan of the chest with contrast, showed no evidence of any acute pulmonary embolism. The patient also had a pleural-based opacity in the posterior aspect of the right lower lobe representing a small area of airspace consolidation, atherosclerosis and 3-vessel coronary disease.   ASSESSMENT AND PLAN: This is a 74 year old male with a history of coronary artery disease, status post stent, diabetes, hypertension, chronic obstructive pulmonary disease, history of Parkinson's disease, depression, hyperlipidemia, glaucoma and chronic back pain, who presents to the hospital with chest pain.  1.  Chest pain. The patient does have significant risk factors given his history of diabetes, hypertension, history of coronary artery disease and long-term tobacco abuse. I will, therefore, observe the patient overnight on telemetry, follow serial cardiac markers. His first set of markers are negative. His EKG is  nonspecific I will continue his aspirin, statin and isosorbide. The patient follows up with cardiology at [facility]. If his cardiac markers are negative and he is asymptomatic, he can get a stress test done as an outpatient through his cardiologist at [facility].  2.  Diabetes. The patient's blood sugars are stable. Continue his metformin and glipizide, and place him on a carb-controlled diet.  3.  Chronic obstructive pulmonary disease. No evidence of an acute chronic obstructive pulmonary disease exacerbation. Continue his Advair and Spiriva.  3.  History of Parkinson's. Continue Sinemet.   5.  Hyperlipidemia. Continue Lipitor.  6.  Depression. Continue Wellbutrin.  7.  Glaucoma. Continue Lumigan eye drops.  8.  Diabetic neuropathy. Continue Neurontin.  9.  CODE STATUS: The patient is a full code.   TIME SPENT ON ADMISSION: 45 minutes.     ____________________________ Belia Heman. Verdell Carmine, MD vjs:cs D: 04/05/2013 19:24:32 ET T: 04/05/2013 19:48:04 ET JOB#: [id number]  cc: Belia Heman. Verdell Carmine, MD, <Dictator> Henreitta Leber MD ELECTRONICALLY SIGNED 04/06/2013 13:49

## 2014-09-09 NOTE — Discharge Summary (Signed)
PATIENT NAME:  Jimmy Hill, Jimmy Hill MR#:  [id number] DATE OF BIRTH:  [birth year]  DATE OF ADMISSION:  12/07/2013 DATE OF DISCHARGE:  12/09/2013   For a detailed note, please look at the history and physical done on admission by Dr. Lunette Stands.   DIAGNOSES AT DISCHARGE: As follows: 1. Pneumonia, likely community acquired.  2. Generalized weakness and frequent falls.  3. Parkinson's disease.  4. Diabetes.  5. Hypertension.  6. Hyperlipidemia.  7. History of coronary artery disease.   DIET: The patient is being discharged on a low-sodium, low-fat, American Diabetic Association diet.   ACTIVITY: As tolerated.   FOLLOWUP: With Dr. Dorthea Cove at the [facility] in the next 1 to 2 weeks.   DISCHARGE MEDICATIONS:  1. Lipitor 80 mg at bedtime.  2. Metformin 1000 mg 2 tabs daily. 3. Wellbutrin 300 mg daily.  4. Sinemet 25/100 one tab 4 times daily.  5. Neurontin 800 mg in the morning and 1600 mg in the evening. 6. Protonix 40 mg daily. 7. Plavix 75 mg daily.  8. Vitamin B12 1000 mcg 1 tab daily.  9. Aspirin 81 mg daily. 10. Calcium with vitamin D 1 tab b.i.d. 11. Glipizide 10 mg b.i.d.  12. Levaquin 500 mg daily for 5 days.   PERTINENT STUDIES DONE DURING THE HOSPITAL COURSE: Are as follows:  A CT scan of the head done without contrast showing no acute intracranial process. 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  A chest x-ray done on admission showing minimal right basilar airspace opacity, may reflect atelectasis or possible mild pneumonia.  Blood cultures noted to be negative.   HOSPITAL COURSE: This is a 74 year old male who presented to the hospital due to generalized weakness, frequent falls and also noted to have a cough with suspected pneumonia.   1. Pneumonia. This was likely the cause of the patient's cough and generalized weakness. The patient was treated with IV ceftriaxone and Zithromax and has clinically improved. He is currently being  discharged on oral Levaquin. His blood cultures have remained negative, and he is clinically feeling much better.  2. Generalized weakness and frequent falls. This was likely secondary to underlying pneumonia combined with deconditioning and possible underlying Parkinson's disease. The patient has been treated adequately for his pneumonia, but he continues to be weak. Physical therapy consult was obtained. They thought he would benefit from short-term rehab, which is where presently he is being discharged.  3. History of coronary artery disease. The patient has no chest pain, no acute cardiac issues. He will continue his aspirin, Plavix and statin.  4. Diabetes. The patient was maintained on some sliding scale insulin, but will resume his glipizide and metformin upon discharge.  5. Diabetic neuropathy. The patient was maintained on his Neurontin. He will resume that upon discharge.   CODE STATUS: The patient is a full code.   TIME SPENT ON DISCHARGE: 40 minutes.   ____________________________ Belia Heman. Verdell Carmine, MD vjs:lb D: 12/09/2013 13:58:56 ET T: 12/09/2013 14:19:47 ET JOB#: [id number]  cc: Belia Heman. Verdell Carmine, MD, <Dictator> Danelle Berry. Derrek Monaco, MD Henreitta Leber MD ELECTRONICALLY SIGNED 12/15/2013 14:07

## 2014-09-09 NOTE — H&P (Signed)
PATIENT NAME:  Jimmy Hill, Jimmy Hill MR#:  [id number] DATE OF BIRTH:  Jan 11, 1941  DATE OF ADMISSION:  08/15/2013  PRIMARY CARE PHYSICIAN: Dr. Dorthea Cove at [facility].   REFERRING EMERGENCY ROOM PHYSICIAN: Dr. Karma Greaser.   CHIEF COMPLAINT: Chest pain.   HISTORY OF PRESENT ILLNESS: The patient is a 74 year old male who has past medical history of coronary artery disease and status post stent placement. He had some chest pain while working on snow in February 2015 and went to [facility] where his cardiologist is, and he had angiogram done over there and found one of his stents was collapsed and so it was replaced. Initially, he had 4 stents placed many years ago, and since then he has been following with cardiologist at [facility]. Other history: He has diabetes, hypertension, COPD, Parkinson disease, hyperlipidemia. He has been taking his medications regularly. Today morning, when he was doing some exercise moving some stuff from his truck, he started having chest pain, which was on the left side of his chest, pressure-like and tightness, was 8 out of 10, nonradiating,  so he came to the Emergency Room, received some nitroglycerin tablets and with that his pain came down to 3 out of 10, but that also dropped his blood pressure to 81/50, and so he was given some IV fluids by ER physician and given for admission to hospitalist team. Before I saw the patient,  his blood pressure responded to IV fluid bolus, and so the ER physician, because the pain was still 3 out of 10, started him on nitroglycerin drip. Did the nitroglycerin drip just for a few minutes, helped, and his pain totally resolved, so drip was discontinued, so when I saw the patient, the patient did not have any chest pain. On further questioning, he denied any complaint of cough or sputum production or any fever or palpitation.   REVIEW OF SYSTEMS:  CONSTITUTIONAL: No fever or weight gain, weight loss or fatigue.  EYES: No blurring  or double vision.  EARS, NOSE, THROAT: No tinnitus, ear pain or hearing loss.  RESPIRATORY: No cough, wheezing, hemoptysis or shortness of breath.  CARDIOVASCULAR: The patient had chest pain which responded to nitroglycerin tablet and then nitroglycerin IV drip. No palpitation or dizziness or vertigo. GASTROINTESTINAL: No nausea, vomiting, diarrhea or abdominal pain.  GENITOURINARY: No dysuria, hematuria or increased frequency.  ENDOCRINE: No heat or cold intolerance. No increased sweating.  SKIN: No acne, rashes or lesions.  MUSCULOSKELETAL: No pain or swelling in the joints.  NEUROLOGICAL: No numbness, weakness, tremor or vertigo.  PSYCHIATRIC: No anxiety, insomnia or bipolar disorder.   PAST MEDICAL HISTORY:   1.  Coronary artery disease and 4 stent placement many years ago, and then recently in February 2015, he had chest pain again and found having 1 stent was collapsed, which was replaced again with angioplasty at [facility].  2.  Diabetes.  3.  Hypertension.  4.  Chronic obstructive pulmonary disease.  5.  History of Parkinson disease.  6.  Depression.  7.  Hyperlipidemia.  8.  Glaucoma.  9.  Chronic back pain.   SOCIAL HISTORY: Long-term smoking history about 40 to 50 years, but quit more than a year ago. No alcohol abuse. No illicit drug use. Lives at home with his wife.   FAMILY HISTORY: Both parents are dead. His father died from complications of heart disease. Mother died from old age.   CURRENT MEDICATIONS AT HOME:   1.  Wellbutrin 300 mg 24-hour extended  release tablet once a day.  2.  Vitamin B12, 1000 mcg oral tablet once a day.  3.  Spiriva 18 mcg inhalation capsule 2 times a day.  4.  Sinemet 25/100 mg oral tablet 4 times a day.  5.  Plavix 75 mg oral once a day.  6.  Pantoprazole 40 mg oral once a day.  7.  Neurontin 800 mg oral tablet 1 in the morning and 2 in the afternoon.  8.  Metformin 1000 mg oral tablet extended-release 2 tablets once a day.  9.  Lipitor  80 mg oral tablet once a day.  10.  Isosorbide dinitrate 10 mg oral tablet 3 times a day.  11.  Ibuprofen 400 mg oral tablet every 6 hours as needed.  12.  Glipizide 10 mg oral tablet once a day.  13.  Combigan ophthalmic solution 1 drop each eye every 12 hours.  14.  Calcium 600 plus vitamin D 600, 2 times a day.  15.  Aspirin 81 mg once a day.  16.  Advair 1 puff inhalation 2 times a day.   PHYSICAL EXAMINATION: VITAL SIGNS: In the Emergency Room, temperature 97.6, pulse 90, respirations 18, blood pressure 166/78, pulse ox 95% on room air. Blood pressure dropped after receiving nitroglycerin tablet up to 81/54 and heart rate went up to 104 and gradually after IV fluid, blood pressure came up to 119/61 and heart rate was 104.  GENERAL: The patient is fully alert and oriented to time, place and person. Does not appear in any acute distress.  HEENT: Head and neck atraumatic. Conjunctivae pink. Oral mucosa moist.  NECK: Supple. No JVD.  RESPIRATORY: Bilateral clear and equal air entry.  CARDIOVASCULAR: S1, S2 present, regular. No murmur.  ABDOMEN: Soft, nontender. Bowel sounds present. No organomegaly.  SKIN: No rashes.  EXTREMITIES:  Legs: No edema.  NEUROLOGICAL: Power 5/5. Follows commands. No gross abnormality.  SKIN: No acne or rashes.  NEUROLOGICAL:  No tremor. Power 5/5. Follows commands. No gross abnormality.  MUSCULOSKELETAL  Legs:  No edema. Joints: No swelling or tenderness.  PSYCHIATRIC: Does not appear in any acute psychiatric illness.   IMPORTANT LABORATORY RESULTS: Glucose 297, BUN 14, creatinine 1.27, sodium 134, potassium 4.5, chloride 100, CO2 of 28. Calcium level is 9. Troponin less than 0.02. WBC 11.7, hemoglobin 13.6, platelet count 270. MCV is 80. Chest x-ray, portable single view, showed no acute cardiopulmonary disease.   ASSESSMENT AND PLAN: A 74 year old male who has past medical history of coronary artery disease and recently diagnosed having blockage in one of the  stents, and so in February 2015, had repeated angioplasty and stent placement was done, came after having chest pain while moving some heavy stuff from his truck, doing physical activity. The pain was 8 out of 10, which got relieved by nitroglycerin down to 3 out of 10, but developed hypotension after that. He was given IV fluid, blood pressure came up, and started on nitroglycerin IV drip for a few minutes, which helped to relieve the pain totally and pain free at time of admission.  1.  Unstable angina. The patient has strong cardiac history and the pain is very typical for having some blockages, so we will admit to telemetry. We will give nitroglycerin oral as-needed basis and we will continue cardiac medication for now and we will call cardiology consult. We will do serial troponin monitoring.  2.  Coronary artery disease. The patient is already taking aspirin, Plavix, statin, isosorbide dinitrate. We will continue  the same. He is not taking any beta blocker. I would like to start him on low dose carvedilol and we will call cardiology consult for further management of this issue. We will also get echocardiogram for this angina episode.  3.  Diabetes. We would like to hold his metformin at this time, but we will continue insulin sliding scale coverage after fingerstick. 4.  Hyperlipidemia, continue atorvastatin.  5. History of chronic obstructive pulmonary disease. Currently, there are no exacerbation symptoms. Continue his Spiriva and Advair.  6.  Parkinson disease. Continue carbidopa and levodopa.   CODE STATUS: FULL CODE.   Total time spent on this admission is 50 minutes.    ____________________________ Ceasar Lund Anselm Jungling, MD vgv:dmm D: 08/15/2013 21:43:26 ET T: 08/15/2013 22:09:38 ET JOB#: [id number]  cc: Ceasar Lund. Anselm Jungling, MD, <Dictator> Vaughan Basta MD ELECTRONICALLY SIGNED 08/16/2013 18:32

## 2014-09-09 NOTE — H&P (Signed)
PATIENT NAME:  Jimmy Hill, CROMWELL MR#:  [id number] DATE OF BIRTH:  01-Jun-1940  DATE OF ADMISSION:  12/07/2013  PRIMARY CARE PHYSICIAN:  Dr. Dorthea Cove at [facility].   REFERRING PHYSICIAN:  Dr. Reita Cliche.   CHIEF COMPLAINT:  Generalized weakness, cough.   HISTORY OF PRESENT ILLNESS:  Mr. Dilone is a 74 year old male with a history of coronary artery disease, status post stent placement, COPD, hypertension, hyperlipidemia, Parkinson's disease, was started to feel severe generalized weakness since this morning.  The patient has been having cough with productive sputum of yellowish sputum.  This afternoon around 12 noon the patient went to the bathroom and fell down to the floor, could not get up.  Since then, the patient remained until 4:00 when the patient's wife returned from the [facility] and found him on the floor.  Called EMS and was brought to the Emergency Department.  Work-up in the Emergency Department, the patient is found to have right basilar air space opacity, possibly mild pneumonia.  The patient is also found to have elevated white blood cell count of 14.5.  The patient states also had an episode of syncope.  The patient was orthostatic positive, initially came in with systolic blood pressure of 102, dropped to 70s.  The patient received Rocephin and Zithromax in the Emergency Department.  Denies having any sick contacts.   PAST MEDICAL HISTORY: 1.  Coronary artery disease, status post four stents placement many years back.  Recent heart catheterization in February 2015 found to have one of the stents collapsed which was replaced again with angioplasty at [facility].  2.  Diabetes mellitus.  3.  Hypertension.  4.  COPD.  5.  Parkinson's disease.  6.  Depression.  7.  Hyperlipidemia.  8.  Glaucoma.  9.  Chronic back pain.   PAST SURGICAL HISTORY: 1.  Right leg fracture.  2.  Laminectomy of the lumbar spine.  3.  Left leg surgery.   ALLERGIES:  No known drug  allergies.   HOME MEDICATIONS: 1.  Wellbutrin 300 mg 1 tablet once a day.  2.  Vitamin B12 1000 mcg once a day.  3.  Sinemet 25/500 mg 4 times a day.  4.  Plavix 75 mg once a day.  5.  Protonix 40 mg once a day.  6.  Neurontin 800 mg 1 tablet in the morning and two in the evening.  7.  Metformin 1000 mg 2 tablets once a day.  8.  Lipitor 80 mg once a day.  9.  Glipizide 10 mg 2 times a day.  10.  Calcarb with vitamin D 2 times a day.  11.  Aspirin 81 mg daily.   SOCIAL HISTORY:  Former smoker, has 40 to 50 year history of smoking, quit more than a year back.  Denies drinking alcohol or using illicit drugs.  Married, lives with his wife, both participate in the [employer] program.   FAMILY HISTORY:  Father died from heart disease.  Mother died from old age.   REVIEW OF SYSTEMS: CONSTITUTIONAL:  Experiencing generalized weakness.  EYES:  No change in vision.  EARS, NOSE, THROAT:  No change in hearing.  RESPIRATORY:  Has cough, shortness.  CARDIOVASCULAR:  No chest pain, palpitations.  GENITOURINARY:  No dysuria or hematuria.  HEMATOLOGIC:  No easy bruising or bleeding.  SKIN:  No rash or lesions.  MUSCULOSKELETAL:  Has been experiencing generalized joint pains and aches.  NEUROLOGIC:  No weakness or numbness in any part  of the body.   PHYSICAL EXAMINATION: GENERAL:  This is a well-built, well-nourished, age-appropriate male lying down in the bed, not in distress.  VITAL SIGNS:  Temperature 98.4, pulse 97, blood pressure 102/56, respiratory rate of 19, oxygen saturation is 97% on 2 liters of oxygen.  Initially the patient's oxygen saturations were 87% on room air.  HEENT:  Head normocephalic, atraumatic.  Eyes, no scleral icterus.  Conjunctivae normal.  Pupils equal and react to light.  Extraocular movements are intact.  Mucous membranes moist.  No pharyngeal erythema.  NECK:  Supple.  No lymphadenopathy.  No JVD.  No carotid bruit.  CHEST:  Has no focal tenderness.  Bilateral coarse  breath sounds, prolonged expiratory phase.  HEART:  S1, S2 regular.  Tachycardia.  ABDOMEN:  Bowel sounds plus.  Soft, nontender, nondistended.  No hepatosplenomegaly.  EXTREMITIES:  No pedal edema.  Pulses 2+.  SKIN:  No rash or lesions.  MUSCULOSKELETAL:  Good range of motion.   NEUROLOGIC:  The patient is alert, oriented to place, person, and time.  Cranial nerves II through XII intact.  Motor 5 by 5 in upper and lower extremities.   LABORATORY DATA:  UA negative for nitrites and leukocyte esterase.  BMP is completely within normal limits.  CBC:  WBC of 14.5, hemoglobin 11, platelet count of 218.  Troponin less than 0.02.   ASSESSMENT AND PLAN:  Mr. Weatherall is a 74 year old male who comes with pneumonia, an episode of syncope with orthostatic hypotension.  1.  Pneumonia.  We will treat it as a community-acquired pneumonia with Rocephin and Zithromax.  Continue with intravenous fluids.  2.  Orthostatic hypotension, could be from the sepsis.  Continue with intravenous fluids and follow up.  3.  Syncope.  No further work-up is needed.  Currently, the patient has a cause of orthostatic hypotension.  Continue with intravenous fluids.  4.  Diabetes mellitus.  Continue with the glipizide.  Hold the metformin.  5.  Keep the patient on deep vein thrombosis prophylaxis with Lovenox.   TIME SPENT:  50 minutes.     ____________________________ Monica Becton, MD pv:ea D: 12/07/2013 23:50:49 ET T: 12/08/2013 00:37:00 ET JOB#: [id number]  cc: Monica Becton, MD, <Dictator> Danelle Berry. Derrek Monaco, MD Grier Mitts Trenace Coughlin MD ELECTRONICALLY SIGNED 12/11/2013 21:14

## 2014-09-09 NOTE — Discharge Summary (Signed)
PATIENT NAME:  Jimmy Hill, CAAMANO MR#:  [id number] DATE OF BIRTH:  [birth year]  DATE OF ADMISSION:  08/15/2013 DATE OF DISCHARGE:  08/17/2013  For a detailed note, please see the history and physical done on admission by Dr. Anselm Jungling.   DIAGNOSES AT DISCHARGE: As follows:  1.  Chest pain, likely noncardiac in nature.  2.  History of coronary artery disease, status post stent placement. 3.  Diabetes. 4.  Hypertension. 5.  Hyperlipidemia. 6.  Parkinson disease.  7.  Glaucoma.   DIET: The patient is being discharged on a low-sodium, low-fat, carb-controlled diet.   ACTIVITY: As tolerated.  FOLLOWUP:  With Dr. Serafina Royals in the next 1 to 2 days. Also follow up with Dr. Derrek Monaco at the [facility] in the next 1 to 2 weeks.  DISCHARGE MEDICATIONS:  Lipitor 80 mg at bedtime, hydrocortisone topical to be applied b.i.d. as needed, metformin 1000 mg 2 tabs daily, Wellbutrin XL 300 mg daily, Chloraseptic spray to be applied q. 2 hours as needed, Sinemet 25/100, 1 tab q.i.d., ibuprofen 4 mg q. 6 hours as needed basis, isosorbide dinitrate 10 mg t.i.d., Neurontin 800 mg 1 tab in the morning and 2 tabs in the p.m., Refresh ophthalmic solution to be applied t.i.d. as needed, Protonix 40 mg daily, Plavix 75 mg daily, Spiriva 1 puff daily, vitamin B12 1000 mcg daily, Combivent 0.2/0.5% ophthalmic solution 1 drop to the affected eye b.i.d., aspirin 81 mg daily, calcium vitamin D 1 tab b.i.d., Advair 250 one puff b.i.d., glipizide 10 mg b.i.d.   CONSULTANTS DURING HOSPITAL COURSE:  Dr. Serafina Royals from cardiology.   PERTINENT STUDIES DONE DURING THE HOSPITAL COURSE: Are as follows: A chest x-ray done on admission showing no acute disease. A 2-dimensional echocardiogram done showing ejection fraction to be 55% to 60%, mild to moderate mitral valve regurgitation, severely increased left ventricular posterior wall thickness, mild tricuspid regurgitation.   HOSPITAL COURSE: This is a 74 year old  male with medical problems as mentioned above, presented to the hospital with chest pain.   PROBLEM: 1.  Chest pain. The patient had significant risk factors for having underlying angina and coronary artery disease given the fact that he had a recent catheter with a stent placement a month ago along with underlying diabetes and hypertension. The patient was, therefore, observed overnight on telemetry, had 3 sets of cardiac markers checked which were negative. A cardiology consult was obtained. The patient was seen by Dr. Nehemiah Massed. As per him, given the fact that his chest pain has now resolved and his markers are negative. We are to continue medical management with aspirin, Plavix, beta blocker, statin and the Isordil. The patient will have close followup  with cardiology as an outpatient. At this point, cardiology did not feel like he needed acute intervention, as he is asymptomatic.  2.  Diabetes as per the patient, the patient's blood sugars have been significantly uncontrolled. I did increase his glipizide from 10 mg daily to 10 mg b.i.d., maintain him on his metformin, further titrations to his diabetic medications should be done by his primary care physician.  3.  Hyperlipidemia. The patient was maintained on his atorvastatin. He will resume that.  4.  Parkinson disease. The patient was maintained on his Sinemet. He will resume that. 5.  History of chronic obstructive pulmonary disease. The patient had no acute exacerbation. He will continue his Spiriva. 6.  Depression. The patient was maintained on his Wellbutrin. He will also resume that upon discharge.  The  patient is a full code.   TIME SPENT: 35 minutes.  ____________________________ Belia Heman. Verdell Carmine, MD vjs:ce D: 08/17/2013 14:57:14 ET T: 08/17/2013 19:14:43 ET JOB#: [id number]  cc: Belia Heman. Verdell Carmine, MD, <Dictator> Danelle Berry. Derrek Monaco, MD Henreitta Leber MD ELECTRONICALLY SIGNED 08/28/2013 18:42

## 2014-09-09 NOTE — Consult Note (Signed)
PATIENT NAME:  Jimmy Hill, Jimmy Hill MR#:  [id number] DATE OF BIRTH:  April 01, 1941  DATE OF CONSULTATION:  08/16/2013  REFERRING PHYSICIAN:  Ceasar Lund. Anselm Jungling, MD CONSULTING PHYSICIAN:  Corey Skains, MD  REASON FOR CONSULTATION: Coronary artery disease status post stent placement, hypertension, hyperlipidemia, with chest pain responding to nitroglycerin.   CHIEF COMPLAINT: "I have chest pain."   HISTORY OF PRESENT ILLNESS: A 74 year old male with known coronary artery disease, recently having, in February, chest discomfort and multiple stents placed. At that time, the patient had some significant improvement of symptoms and was placed on appropriate medication management. Since then, he has done relatively well, with no evidence of heart failure or other issues. He has had acute onset of substernal chest discomfort radiating into the back that responded to nitroglycerin. Currently, stable at this time with a normal troponin and an EKG showing normal sinus rhythm with nonspecific ST changes. There is no evidence of significant myocardial infarction, and the patient has had a recent echocardiogram showing normal LV systolic function without evidence of significant valvular heart disease. Ejection fraction was 55%.   REVIEW OF SYSTEMS: The remainder of review of systems negative for weight gain, weight loss, syncope, dizziness, nausea, diaphoresis, abdominal pain, frequent urination, urination at night, muscle weakness, numbness, anxiety, depression, skin lesions or skin rashes.   PAST MEDICAL HISTORY:  1. Coronary artery disease.  2. Hypertension. 3. Hyperlipidemia. 4. Stent placements.   FAMILY HISTORY: No family members with early onset of cardiovascular disease or hypertension.   SOCIAL HISTORY: Currently denies alcohol or tobacco use.   ALLERGIES: As listed.   MEDICATIONS: As listed.   PHYSICAL EXAMINATION:  VITAL SIGNS: Blood pressure is 136/68 bilaterally, heart rate 72 upright,  reclining, and regular.  GENERAL: He is a well-appearing male in no acute distress.  HEAD, EYES, EARS, NOSE AND THROAT: No icterus, thyromegaly, ulcers, hemorrhage or xanthelasma.  CARDIOVASCULAR: Regular rate and rhythm, normal S1 and S2, without murmur, gallop or rub. PMI is diffuse. Carotid upstroke normal without bruit. Jugular venous pressure is normal.  LUNGS: Clear to auscultation with normal respirations.  ABDOMEN: Soft, nontender, without hepatosplenomegaly or masses. Abdominal aorta is normal size, without bruit.  EXTREMITIES: Showed 2+ radial, femoral and dorsal pedal pulses, with trace lower extremity edema. No cyanosis, clubbing or ulcers.  NEUROLOGIC: He is oriented to time, place and person, with normal mood and affect.   ASSESSMENT: A 74 year old male with coronary artery disease, hypertension and hyperlipidemia, with previous stent placement, with chest pain with no current evidence of acute myocardial infarction or congestive heart failure.   RECOMMENDATIONS:  1. Continue current medical regimen, including beta blocker, ACE inhibitor for hypertension control and risk reduction. 2. Nitrates, including isosorbide, for further chest discomfort.  3. Plavix and aspirin for further risk reduction and in-stent thrombosis.  4. Ambulate and follow for any further significant symptoms and need for adjustments of medications, including above, including the possibility of Ranexa as an outpatient. 5. If chest pain recurs, would further evaluate with stress test, although if no recurrence, may be able to ambulate, discharge to home. Will follow up in 1 week.   ____________________________ Corey Skains, MD bjk:lb D: 08/16/2013 13:56:09 ET T: 08/16/2013 14:05:18 ET JOB#: [id number]  cc: Corey Skains, MD, <Dictator> Corey Skains MD ELECTRONICALLY SIGNED 08/22/2013 13:00

## 2014-09-10 NOTE — H&P (Signed)
PATIENT NAME:  Jimmy Hill, Jimmy Hill MR#:  [id number] DATE OF BIRTH:  15-Jan-1941  DATE OF ADMISSION:  11/18/2011  PRIMARY CARE PHYSICIAN: Dorthea Cove, MD   ER REFERRING PHYSICIAN: Beaulah Dinning, MD     CHIEF COMPLAINT: Cough, sputum, shortness of breath for two days.   HISTORY OF PRESENT ILLNESS: The patient is a 74 year old Caucasian male with a history of chronic obstructive pulmonary disease, coronary artery disease status post stent, hypertension, diabetes, acute renal failure, gastroesophageal reflux disease, Parkinson's disease with dementia, depression, hyperlipidemia, presented to the ED with the above chief complaint. The patient is alert, awake, oriented. He said he has had a cough, sputum, with shortness of breath for about two days. He also has chest pains exacerbated by cough on both sides. In addition, he has weakness and a little bit of fever, but he denies any other symptoms. In the ED, Dr. Prince Rome did a chest CAT scan which showed right posterior pneumonia. He was treated with Zithromax and Rocephin and admitted for pneumonia.   PAST MEDICAL HISTORY: As mentioned above:  1. Hypertension. 2. Diabetes. 3. Coronary artery disease. 4. Chronic obstructive pulmonary disease. 5. Parkinson's disease.  6. Hyperlipidemia.  7. Gastroesophageal reflux disease.  8. Depression.   SOCIAL HISTORY: The patient is a long-time smoker. He decreased smoking to 1 to 2 cigarettes per day, but he has been smoking for about 50 years. No alcohol drinking or illicit drugs.  PAST SURGICAL HISTORY:  1. Back surgery.  2. Right ankle surgery.   FAMILY HISTORY: Hypertension, diabetes.   ALLERGIES: No known drug allergies.   MEDICATIONS:  1. Bupropion 100 mg p.o. t.i.d.  2. Caltrate with D 600 mg/400 international units, 1 tablet p.o. b.i.d.  3. Carbidopa/levodopa 25 mg/100 mg, 1 tablet p.o. t.i.d. at 12:00 p.m. and 4:30 p.m. and 10:00 p.m., 1.5 tablets once daily at 8:00 a.m.  4. Gabapentin 400  mg p.o. t.i.d.   5. Glipizide 5, 0.5 tablet p.o. daily.  6. Isosorbide mononitrate 30 mg p.o. once daily.   7. Lyrica 75 mg p.o. t.i.d.  8. Meloxicam 7.5 mg p.o. once daily.  9. Metformin 500 mg p.o. 2 tablets b.i.d.  10. Toprol-XL 25 mg p.o., 0.5 tablet p.o. daily.  11. Vitamin B12 1000 mcg p.o. daily.    REVIEW OF SYSTEMS: CONSTITUTIONAL: The patient has fever but no chills. HEENT: No headache, dizziness but has weakness. EYES: No double vision, double vision. ENT: No postnasal drip, slurred speech or dysphagia. No epistaxis. PULMONARY:  for cough, sputum, shortness of breath, but no hemoptysis. CARDIOVASCULAR: Positive for pain but no palpitations, orthopnea, nocturnal dyspnea. No leg edema. GASTROINTESTINAL: No abdominal pain, nausea, vomiting, or diarrhea. No melena, or bloody stool. GU: No dysuria, hematuria, or incontinence. SKIN: No rash or jaundice. NEUROLOGY: No syncope, loss of consciousness or seizure. HEMATOLOGY: No easy bruising or bleeding. ENDOCRINE: No polyuria, polydipsia, or heat or cold intolerance.   PHYSICAL EXAMINATION:  VITAL SIGNS: Temperature 99.5, blood pressure 100/56 and now blood pressure is 95/55, pulse 106, respirations 20, oxygen saturation 94% on room air.   GENERAL: The patient is alert, awake, oriented, in no acute distress.   HEENT: Pupils are round, equal, reactive to light, accommodation. Moist oral mucosa. Clear oropharynx.   NECK: Supple. No JVD or carotid bruit. No lymphadenopathy. No thyromegaly.   CARDIOVASCULAR: S1, S2 regular rate, rhythm. No murmurs, gallops.   PULMONARY: Bilateral air entry. Decreased lung sounds but no wheezing, rales, or crackles.    ABDOMEN: Soft, obese.  No distention. No tenderness. No organomegaly.  Bowel sounds are present.  EXTREMITIES: No edema, clubbing, or cyanosis. No calf tenderness. Strong bilateral pedal pulses.   SKIN: No rash or jaundice.   NEUROLOGY: Alert and oriented x3. No focal deficit. Power 5/5.  Sensation intact. Deep tendon reflexes 2+.   LABORATORY, DIAGNOSTIC AND RADIOLOGICAL DATA:  ABG showed pH 7.44, pCO2 39, pO2 76.  WBC 13.7, hemoglobin 12.6, platelets 211.  Glucose 173, BUN 19, creatinine 1.48, sodium 134, potassium 4.3, bicarbonate 25, chloride 99. Troponin less than 0.02.  Chest x-ray: No definite evidence of acute cardiopulmonary abnormality. CAT scan of the chest showed right posterior infiltrate.  EKG shows sinus tachycardia with 114 beats per minute.   IMPRESSION:  1. Community-acquired pneumonia.  2. Leukocytosis.  3. Acute renal failure.  4. Chronic obstructive pulmonary disease.  5. Coronary artery disease.  6. Diabetes.  7. Hypertension but with a low blood pressure.  8. Hyperlipidemia.  9. Obesity.  10. Tobacco use.   PLAN OF TREATMENT:  1. The patient will be admitted to Medical floor with telemonitor.  2. We will give O2 by nasal cannula, Levaquin IV, and give a nebulizer p.r.n. and follow-up CBC, blood culture, sputum culture.  3. For acute renal failure with renal insufficiency, we will give IV fluid normal saline and follow-up BMP.  4. For coronary artery disease, the patient said that he is taking aspirin 81 mg p.o. daily. Continue aspirin, and we will add Zocor and check lipid panel.  5. Since the patient's high blood pressure is lower side, we will hold Toprol and isosorbide mononitrate.   6. For diabetes, we will start a sliding scale but hold metformin and glipizide, and also we will hold Meloxicam due to renal insufficiency.  7. Smoking cessation was counseled.  8. GI and deep vein thrombosis prophylaxis.   I discussed the patient's situation and the plan of treatment with the patient and the patient's wife.    TIME SPENT: About 60 minutes.    ____________________________ Demetrios Loll, MD qc:cbb D: 11/18/2011 17:20:00 ET T: 11/18/2011 18:20:36 ET JOB#: [id number]  cc: Danelle Berry. Derrek Monaco, MD Demetrios Loll, MD, <Dictator>  Demetrios Loll  MD ELECTRONICALLY SIGNED 11/19/2011 14:50

## 2014-09-10 NOTE — Op Note (Signed)
PATIENT NAME:  Jimmy Hill, Jimmy Hill MR#:  [id number] DATE OF BIRTH:  1940/10/26  DATE OF PROCEDURE:  10/28/2011  PREOPERATIVE DIAGNOSIS:  1. Right knee medial and lateral meniscus tears.  2. Osteoarthritis.   POSTOPERATIVE DIAGNOSIS:  1. Right knee medial and lateral meniscus tears with loose body. 2. Osteoarthritis.   PROCEDURES:  1. Arthroscopy, right knee, with partial medial and lateral meniscectomy.  2. Removal of loose body.   SURGEON: Laurene Footman, MD   ANESTHESIA: General.    DESCRIPTION OF PROCEDURE: The patient was brought to the Operating Room, and after adequate anesthesia was obtained the right leg was prepped and draped in the usual sterile fashion. After application of a tourniquet and the arthroscopic legholder, the leg was prepped and draped in the usual sterile fashion. After prepping and draping in the usual sterile fashion, appropriate patient identification and timeout procedures were carried out. An inferolateral portal was made and the arthroscope was introduced. Initial inspection revealed some central patellar chondromalacia with good patellofemoral tracking. This was the most normal portion of the knee with minimal femoral trochlear wear, just some grade I changes. Next, coming around the medial compartment an inferomedial portal was made and a probe introduced. On probing, there was a small posterior horn tear involving 2 or 3 mm of the inner edge of the medial meniscus. There were extensive changes to the medial compartment on femoral and tibial sides with extensive partial thickness loss. There was no fissuring and no exposed bone. The anterior cruciate ligament was intact. Going to the notch, the anterior cruciate ligament was normal in appearance and to probing. Going to the lateral compartment, there was a significant posterior horn tear with a portion that would flip into the joint. There was also a small loose body approximately 5 mm in diameter which was removed  at this point. An ArthroCare wand was then used to address the posterior lateral and posterior medial meniscus tears with pre- and postprocedure pictures being obtained. The gutters were free of any loose bodies The knee was thoroughly irrigated, and then all instrumentation was withdrawn. The wounds were closed with a Dermabond and the wound dressed with Telfa, 4 x 4's, Webril, and Ace wrap. The patient was sent to recovery in stable condition.   ESTIMATED BLOOD LOSS: Minimal.   COMPLICATIONS: None.   SPECIMEN: None.   ____________________________ Laurene Footman, MD mjm:cbb D: 10/28/2011 21:19:03 ET T: 10/29/2011 09:37:58 ET JOB#: [id number]  cc: Laurene Footman, MD, <Dictator> Laurene Footman MD ELECTRONICALLY SIGNED 10/29/2011 13:03

## 2014-09-10 NOTE — Discharge Summary (Signed)
PATIENT NAME:  Jimmy Hill, Jimmy Hill MR#:  [id number] DATE OF BIRTH:  1941/03/13  DATE OF ADMISSION:  11/18/2011 DATE OF DISCHARGE:  11/22/2011  PRIMARY CARE PHYSICIAN:  Dorthea Cove, MD  DISCHARGE DIAGNOSES: 1. Right lower lobe community-acquired pneumonia. 2. Acute respiratory failure. 3. Chronic obstructive pulmonary disease exacerbation. 4. Chronic kidney disease stage III. 5. Well controlled diabetes. 6. Well controlled hypertension. 7. Parkinson's disease with high risk of falls.  CONSULTS:  None.  IMAGING STUDIES:   1. Chest x-ray showed no acute abnormalities. 2. CT of the chest done on 11/18/2011 showed right lower lobe airspace disease most concerning for pneumonia.  ADMITTING HISTORY AND PHYSICAL:  Please see detailed History and Physical dictated on 11/18/2011 by Dr. Bridgett Larsson.  In brief, 74 year old Caucasian male patient with history of chronic obstructive pulmonary disease, coronary artery disease, hypertension, and diabetes along with Parkinson's who presented to the emergency room complaining of cough, sputum production, and shortness of breath for two days.  The patient was found to have right lower lobe pneumonia along with acute respiratory failure and was admitted to the hospitalist service for further management.  HOSPITAL COURSE:  The patient was admitted on pneumonia core measures, started on IV antibiotics, blood and sputum cultures sent.  Oxygen for hypoxic respiratory failure.  The patient was started on Zithromax and Rocephin to start with.  This was later transitioned to levofloxacin.  His cultures have been negative to date.  The patient improved well with his pneumonia.  He was on oral steroids for his chronic obstructive pulmonary disease.  On the day of discharge he does not have any shortness of breath. He is saturating 96% on room air.  Lung examination shows minimal wheezing.  The patient is being discharged home in stable condition to follow up with his primary  care physician.  The patient will be on levofloxacin for five more days after discharge and steroid dose for one week.  The patient was added on albuterol, Advair, and Spiriva during the hospital stay for his chronic obstructive pulmonary disease.  DISCHARGE MEDICATIONS: 1. Levofloxacin 500 mg oral once a day for five days. 2. Bupropion 100 mg oral three times a day. 3. Caltrate with vitamin D 600/400 international units, 1 tablet oral two times a day. 4. Carbidopa/levodopa 25/100, 1 tablet oral three times a day at 12 p.m., 4:30 p.m., and 10 p.m., 1.5 tablets daily at 8 a.m. 5. Gabapentin 400 mg oral three times a day. 6. Glipizide 2.5 mg oral daily. 7. Isosorbide mononitrate 30 mg oral once a day. 8. Lyrica 75 mg oral three times a day. 9. Meloxicam 7.5 mg oral once a day. 10. Metformin 500 mg oral, 2 tablets twice a day. 11. Toprol XL 25 mg oral, 0.5 tablet oral daily. 12. Vitamin B12 1000 mcg oral daily. 13. Albuterol inhaler 2 puffs inhalation every 4 hours as needed. 14. Spiriva 18 mcg, one capsule inhalation daily. 15. Advair 250/50 inhalation twice a day.  DISCHARGE INSTRUCTIONS:    1. Follow up with primary care physician Dr. Dorthea Cove in one to two weeks.   2. The patient will be on a diabetic diet. 3. Activity as tolerated using a walker and with assistance at all times. 4. The patient will [facility]. 5. The patient is to return to the emergency room if he has any fever or worsening shortness of breath.  This plan was discussed with the patient who verbalized understanding and is okay with  the plan.        TIME SPENT:  40 minutes.  ____________________________ Leia Alf Kandas Oliveto, MD srs:bjt D: 11/22/2011 12:55:31 ET T: 11/24/2011 12:04:16 ET JOB#: [id number]  cc: Alveta Heimlich R. Lanyiah Brix, MD, <Dictator> Danelle Berry. Derrek Monaco, MD Neita Carp MD ELECTRONICALLY SIGNED 11/28/2011 14:12

## 2014-12-09 ENCOUNTER — Emergency Department: Payer: Medicare (Managed Care)

## 2014-12-09 ENCOUNTER — Observation Stay
Admission: EM | Admit: 2014-12-09 | Discharge: 2014-12-10 | Disposition: A | Discharge status: Home / Self Care | Payer: Medicare (Managed Care) | Attending: Specialist | Admitting: Specialist

## 2014-12-09 DIAGNOSIS — I10 Essential (primary) hypertension: Secondary | ICD-10-CM | POA: Diagnosis not present

## 2014-12-09 DIAGNOSIS — I340 Nonrheumatic mitral (valve) insufficiency: Secondary | ICD-10-CM | POA: Insufficient documentation

## 2014-12-09 DIAGNOSIS — R0789 Other chest pain: Secondary | ICD-10-CM | POA: Diagnosis not present

## 2014-12-09 DIAGNOSIS — M47814 Spondylosis without myelopathy or radiculopathy, thoracic region: Secondary | ICD-10-CM | POA: Diagnosis not present

## 2014-12-09 DIAGNOSIS — I071 Rheumatic tricuspid insufficiency: Secondary | ICD-10-CM | POA: Insufficient documentation

## 2014-12-09 DIAGNOSIS — K219 Gastro-esophageal reflux disease without esophagitis: Secondary | ICD-10-CM | POA: Insufficient documentation

## 2014-12-09 DIAGNOSIS — G20 Parkinson's disease: Secondary | ICD-10-CM | POA: Insufficient documentation

## 2014-12-09 DIAGNOSIS — Z7951 Long term (current) use of inhaled steroids: Secondary | ICD-10-CM | POA: Diagnosis not present

## 2014-12-09 DIAGNOSIS — I2510 Atherosclerotic heart disease of native coronary artery without angina pectoris: Secondary | ICD-10-CM | POA: Diagnosis not present

## 2014-12-09 DIAGNOSIS — Z833 Family history of diabetes mellitus: Secondary | ICD-10-CM | POA: Insufficient documentation

## 2014-12-09 DIAGNOSIS — R079 Chest pain, unspecified: Secondary | ICD-10-CM

## 2014-12-09 DIAGNOSIS — E1140 Type 2 diabetes mellitus with diabetic neuropathy, unspecified: Secondary | ICD-10-CM | POA: Diagnosis not present

## 2014-12-09 DIAGNOSIS — H409 Unspecified glaucoma: Secondary | ICD-10-CM | POA: Insufficient documentation

## 2014-12-09 DIAGNOSIS — I252 Old myocardial infarction: Secondary | ICD-10-CM | POA: Insufficient documentation

## 2014-12-09 DIAGNOSIS — M79602 Pain in left arm: Secondary | ICD-10-CM | POA: Diagnosis not present

## 2014-12-09 DIAGNOSIS — Z8249 Family history of ischemic heart disease and other diseases of the circulatory system: Secondary | ICD-10-CM | POA: Diagnosis not present

## 2014-12-09 DIAGNOSIS — F17200 Nicotine dependence, unspecified, uncomplicated: Secondary | ICD-10-CM | POA: Diagnosis not present

## 2014-12-09 DIAGNOSIS — Z7982 Long term (current) use of aspirin: Secondary | ICD-10-CM | POA: Insufficient documentation

## 2014-12-09 DIAGNOSIS — Z79899 Other long term (current) drug therapy: Secondary | ICD-10-CM | POA: Diagnosis not present

## 2014-12-09 DIAGNOSIS — Z794 Long term (current) use of insulin: Secondary | ICD-10-CM | POA: Diagnosis not present

## 2014-12-09 DIAGNOSIS — N179 Acute kidney failure, unspecified: Secondary | ICD-10-CM | POA: Insufficient documentation

## 2014-12-09 DIAGNOSIS — Z955 Presence of coronary angioplasty implant and graft: Secondary | ICD-10-CM | POA: Diagnosis not present

## 2014-12-09 HISTORY — DX: Acute myocardial infarction, unspecified: I21.9

## 2014-12-09 HISTORY — DX: Angina pectoris, unspecified: I20.9

## 2014-12-09 HISTORY — DX: Atherosclerotic heart disease of native coronary artery without angina pectoris: I25.10

## 2014-12-09 LAB — CBC WITH DIFFERENTIAL/PLATELET
Basophils Absolute: 0 10*3/uL (ref 0–0.1)
Basophils Relative: 1 %
EOS ABS: 0 10*3/uL (ref 0–0.7)
EOS PCT: 0 %
HCT: 41.4 % (ref 40.0–52.0)
Hemoglobin: 13.5 g/dL (ref 13.0–18.0)
LYMPHS PCT: 23 %
Lymphs Abs: 1.4 10*3/uL (ref 1.0–3.6)
MCH: 28.4 pg (ref 26.0–34.0)
MCHC: 32.7 g/dL (ref 32.0–36.0)
MCV: 86.8 fL (ref 80.0–100.0)
MONOS PCT: 2 %
Monocytes Absolute: 0.1 10*3/uL — ABNORMAL LOW (ref 0.2–1.0)
Neutro Abs: 4.5 10*3/uL (ref 1.4–6.5)
Neutrophils Relative %: 74 %
PLATELETS: 218 10*3/uL (ref 150–440)
RBC: 4.77 MIL/uL (ref 4.40–5.90)
RDW: 15.3 % — ABNORMAL HIGH (ref 11.5–14.5)
WBC: 6.1 10*3/uL (ref 3.8–10.6)

## 2014-12-09 LAB — BASIC METABOLIC PANEL
Anion gap: 14 (ref 5–15)
BUN: 20 mg/dL (ref 6–20)
CALCIUM: 8.4 mg/dL — AB (ref 8.9–10.3)
CO2: 21 mmol/L — AB (ref 22–32)
Chloride: 97 mmol/L — ABNORMAL LOW (ref 101–111)
Creatinine, Ser: 1.47 mg/dL — ABNORMAL HIGH (ref 0.61–1.24)
GFR calc Af Amer: 52 mL/min — ABNORMAL LOW (ref >60)
GFR calc non Af Amer: 45 mL/min — ABNORMAL LOW (ref >60)
GLUCOSE: 461 mg/dL — AB (ref 65–99)
POTASSIUM: 4.8 mmol/L (ref 3.5–5.1)
SODIUM: 132 mmol/L — AB (ref 135–145)

## 2014-12-09 LAB — TROPONIN I

## 2014-12-09 MED ORDER — IPRATROPIUM-ALBUTEROL 0.5-2.5 (3) MG/3ML IN SOLN
RESPIRATORY_TRACT | Status: AC
  Administered 2014-12-09: 3 mL via RESPIRATORY_TRACT
  Filled 2014-12-09: qty 3

## 2014-12-09 MED ORDER — IPRATROPIUM-ALBUTEROL 0.5-2.5 (3) MG/3ML IN SOLN
3.0000 mL | Freq: Once | RESPIRATORY_TRACT | Status: AC
  Administered 2014-12-09: 3 mL via RESPIRATORY_TRACT

## 2014-12-09 MED ORDER — ASPIRIN 81 MG PO CHEW
324.0000 mg | CHEWABLE_TABLET | Freq: Once | ORAL | Status: DC
  Filled 2014-12-09: qty 4

## 2014-12-09 NOTE — ED Notes (Signed)
Pt reports that he was sitting on his porch this evening and began to have chest pain in center of chest that radiated toward left arm. He states that he took 3 nitro before calling 911 and that EMS personnel gave him aspirin on the truck. Pt is tachycardic and shaky during assessment.

## 2014-12-09 NOTE — ED Notes (Addendum)
sats 91% on room air;  Pt placed on 2L oxygen via [place]; sats up to 95%

## 2014-12-09 NOTE — ED Provider Notes (Signed)
[facility] Emergency Department Provider Note    ____________________________________________  Time seen: 2015  I have reviewed the triage vital signs and the nursing notes.   HISTORY  Chief Complaint Chest Pain   History limited by: Not Limited   HPI Jimmy Hill is a 74 y.o. male who reports to the emergency department today because of chest pain. He states it started suddenly 2 hours ago lost T was sitting down. He describes it as sharp. It is located in the central and left chest. It is associated with some shortness of breath. He states that the pain does radiate into his left arm. He tried taking nitroglycerin without any relief. Denies any recent fevers. He does state that he has been recently treated for an upper respiratory infection with a Z-Pak and steroids.   No past medical history on file.  There are no active problems to display for this patient.   No past surgical history on file.  No current outpatient prescriptions on file.  Allergies Review of patient's allergies indicates not on file.  No family history on file.  Social History Former smoker Lives with wife  Review of Systems  Constitutional: Negative for fever. Cardiovascular: Positive for chest pain. Respiratory: Positive for shortness of breath. Gastrointestinal: Negative for abdominal pain, vomiting and diarrhea. Genitourinary: Negative for dysuria. Musculoskeletal: Negative for back pain. Skin: Negative for rash. Neurological: Negative for headaches, focal weakness or numbness.   10-point ROS otherwise negative.  ____________________________________________   PHYSICAL EXAM:  VITAL SIGNS: ED Triage Vitals  Enc Vitals Group     BP 12/09/14 2009 129/78 mmHg     Pulse --      Resp 12/09/14 2009 18     Temp --      Temp src --      SpO2 12/09/14 2009 91 %   Constitutional: Alert and oriented. Well appearing and in no distress. Eyes: Conjunctivae  are normal. PERRL. Normal extraocular movements. ENT   Head: Normocephalic and atraumatic.   Nose: No congestion/rhinnorhea.   Mouth/Throat: Mucous membranes are moist.   Neck: No stridor. Hematological/Lymphatic/Immunilogical: No cervical lymphadenopathy. Cardiovascular: Tachycardic, regular rhythm.  No murmurs, rubs, or gallops. Respiratory: Normal respiratory effort without tachypnea nor retractions. Diffuse expiratory wheeze Gastrointestinal: Soft and nontender. No distention. There is no CVA tenderness. Genitourinary: Deferred Musculoskeletal: Normal range of motion in all extremities. No joint effusions.  No lower extremity tenderness nor edema. Neurologic:  Normal speech and language. No gross focal neurologic deficits are appreciated. Speech is normal.  Skin:  Skin is warm, dry and intact. No rash noted. Psychiatric: Mood and affect are normal. Speech and behavior are normal. Patient exhibits appropriate insight and judgment.  ____________________________________________    LABS (pertinent positives/negatives)  Labs Reviewed  CBC WITH DIFFERENTIAL/PLATELET - Abnormal; Notable for the following:    RDW 15.3 (*)    Monocytes Absolute 0.1 (*)    All other components within normal limits  BASIC METABOLIC PANEL - Abnormal; Notable for the following:    Sodium 132 (*)    Chloride 97 (*)    CO2 21 (*)    Glucose, Bld 461 (*)    Creatinine, Ser 1.47 (*)    Calcium 8.4 (*)    GFR calc non Af Amer 45 (*)    GFR calc Af Amer 52 (*)    All other components within normal limits  TROPONIN I     ____________________________________________   EKG  I, Nance Pear, attending physician,  personally viewed and interpreted this EKG  EKG Time: 2013 Rate: 116 Rhythm: Accelerated junctional rhythm Axis: Normal Intervals: qtc 503 QRS: Narrow ST changes: No ST elevation ____________________________________________    RADIOLOGY  Chest x-ray IMPRESSION: No  active disease. ____________________________________________   PROCEDURES  Procedure(s) performed: None  Critical Care performed: No  ____________________________________________   INITIAL IMPRESSION / ASSESSMENT AND PLAN / ED COURSE  Pertinent labs & imaging results that were available during my care of the patient were reviewed by me and considered in my medical decision making (see chart for details).  Patient presented to the emergency department with acute onset of central chest pain. On exam patient in no acute distress. EKG shows an axillary junctional rhythm however no T-wave inversion or ST elevation. Chest x-ray was normal. Blood work with negative troponin. Given that patient is high risk given history of coronary artery disease will plan on admission for further cardiac observation and workup.  ____________________________________________   FINAL CLINICAL IMPRESSION(S) / ED DIAGNOSES  Final diagnoses:  Chest pain, unspecified chest pain type     Nance Pear, MD 12/09/14 2131

## 2014-12-09 NOTE — ED Notes (Signed)
MD at bedside. 

## 2014-12-09 NOTE — ED Notes (Signed)
Pt reports chest pain starting 2 hours ago. Pt received 3 nitros without any relief. Pt reports elevated bs starting today with new rx of prednisone for a cough.

## 2014-12-10 ENCOUNTER — Observation Stay
Admit: 2014-12-10 | Discharge: 2014-12-10 | Disposition: A | Discharge status: Home / Self Care | Payer: Medicare (Managed Care) | Attending: Internal Medicine | Admitting: Internal Medicine

## 2014-12-10 LAB — LIPID PANEL
Cholesterol: 96 mg/dL (ref 0–200)
HDL: 26 mg/dL — ABNORMAL LOW (ref >40)
LDL CALC: 31 mg/dL (ref 0–99)
Total CHOL/HDL Ratio: 3.7 RATIO
Triglycerides: 195 mg/dL — ABNORMAL HIGH (ref <150)
VLDL: 39 mg/dL (ref 0–40)

## 2014-12-10 LAB — COMPREHENSIVE METABOLIC PANEL
ALBUMIN: 3.1 g/dL — AB (ref 3.5–5.0)
ALT: 23 U/L (ref 17–63)
AST: 28 U/L (ref 15–41)
Alkaline Phosphatase: 55 U/L (ref 38–126)
Anion gap: 7 (ref 5–15)
BUN: 19 mg/dL (ref 6–20)
CHLORIDE: 108 mmol/L (ref 101–111)
CO2: 26 mmol/L (ref 22–32)
Calcium: 8.3 mg/dL — ABNORMAL LOW (ref 8.9–10.3)
Creatinine, Ser: 1.09 mg/dL (ref 0.61–1.24)
GFR calc Af Amer: >60 mL/min (ref >60)
GFR calc non Af Amer: >60 mL/min (ref >60)
Glucose, Bld: 182 mg/dL — ABNORMAL HIGH (ref 65–99)
POTASSIUM: 4 mmol/L (ref 3.5–5.1)
Sodium: 141 mmol/L (ref 135–145)
TOTAL PROTEIN: 6.3 g/dL — AB (ref 6.5–8.1)
Total Bilirubin: 0.6 mg/dL (ref 0.3–1.2)

## 2014-12-10 LAB — TROPONIN I: Troponin I: <0.03 ng/mL (ref <0.031)

## 2014-12-10 LAB — CBC
HCT: 38.5 % — ABNORMAL LOW (ref 40.0–52.0)
Hemoglobin: 12.5 g/dL — ABNORMAL LOW (ref 13.0–18.0)
MCH: 27.8 pg (ref 26.0–34.0)
MCHC: 32.5 g/dL (ref 32.0–36.0)
MCV: 85.6 fL (ref 80.0–100.0)
PLATELETS: 202 10*3/uL (ref 150–440)
RBC: 4.5 MIL/uL (ref 4.40–5.90)
RDW: 15.4 % — AB (ref 11.5–14.5)
WBC: 13.8 10*3/uL — ABNORMAL HIGH (ref 3.8–10.6)

## 2014-12-10 LAB — GLUCOSE, CAPILLARY
Glucose-Capillary: 165 mg/dL — ABNORMAL HIGH (ref 65–99)
Glucose-Capillary: 301 mg/dL — ABNORMAL HIGH (ref 65–99)

## 2014-12-10 MED ORDER — ALBUTEROL SULFATE (2.5 MG/3ML) 0.083% IN NEBU
2.5000 mg | INHALATION_SOLUTION | RESPIRATORY_TRACT | Status: DC
  Filled 2014-12-10: qty 3

## 2014-12-10 MED ORDER — ATORVASTATIN CALCIUM 20 MG PO TABS
80.0000 mg | ORAL_TABLET | Freq: Every day | ORAL | Status: DC
  Administered 2014-12-10: 80 mg via ORAL
  Filled 2014-12-10: qty 4

## 2014-12-10 MED ORDER — ONDANSETRON HCL 4 MG PO TABS: 4.0000 mg | ORAL_TABLET | Freq: Four times a day (QID) | ORAL | Status: DC | PRN

## 2014-12-10 MED ORDER — CLOPIDOGREL BISULFATE 75 MG PO TABS
75.0000 mg | ORAL_TABLET | Freq: Every day | ORAL | Status: DC
  Administered 2014-12-10: 75 mg via ORAL
  Filled 2014-12-10: qty 1

## 2014-12-10 MED ORDER — LIDOCAINE 5 % EX PTCH
1.0000 | MEDICATED_PATCH | Freq: Two times a day (BID) | CUTANEOUS | Status: DC
  Filled 2014-12-10 (×3): qty 1

## 2014-12-10 MED ORDER — ACETAMINOPHEN 325 MG PO TABS: 650.0000 mg | ORAL_TABLET | Freq: Four times a day (QID) | ORAL | Status: DC | PRN

## 2014-12-10 MED ORDER — NITROGLYCERIN 0.4 MG SL SUBL: 0.4000 mg | SUBLINGUAL_TABLET | SUBLINGUAL | Status: DC | PRN

## 2014-12-10 MED ORDER — ONDANSETRON HCL 4 MG/2ML IJ SOLN: 4.0000 mg | Freq: Four times a day (QID) | INTRAMUSCULAR | Status: DC | PRN

## 2014-12-10 MED ORDER — VITAMIN B-12 1000 MCG PO TABS
1000.0000 ug | ORAL_TABLET | Freq: Every day | ORAL | Status: DC
  Administered 2014-12-10: 1000 ug via ORAL
  Filled 2014-12-10: qty 1

## 2014-12-10 MED ORDER — ENOXAPARIN SODIUM 30 MG/0.3ML ~~LOC~~ SOLN: 30.0000 mg | SUBCUTANEOUS | Status: DC

## 2014-12-10 MED ORDER — GABAPENTIN 400 MG PO CAPS
800.0000 mg | ORAL_CAPSULE | Freq: Three times a day (TID) | ORAL | Status: DC
  Administered 2014-12-10: 800 mg via ORAL
  Filled 2014-12-10: qty 2

## 2014-12-10 MED ORDER — GUAIFENESIN ER 600 MG PO TB12
600.0000 mg | ORAL_TABLET | Freq: Every day | ORAL | Status: DC
  Administered 2014-12-10: 600 mg via ORAL
  Filled 2014-12-10: qty 1

## 2014-12-10 MED ORDER — ENOXAPARIN SODIUM 40 MG/0.4ML ~~LOC~~ SOLN: 40.0000 mg | SUBCUTANEOUS | Status: DC

## 2014-12-10 MED ORDER — ACETAMINOPHEN 650 MG RE SUPP: 650.0000 mg | Freq: Four times a day (QID) | RECTAL | Status: DC | PRN

## 2014-12-10 MED ORDER — SODIUM CHLORIDE 0.9 % IV SOLN
INTRAVENOUS | Status: DC
  Administered 2014-12-10: 04:00:00 via INTRAVENOUS

## 2014-12-10 MED ORDER — BUPROPION HCL ER (XL) 150 MG PO TB24
300.0000 mg | ORAL_TABLET | Freq: Every day | ORAL | Status: DC
  Administered 2014-12-10: 300 mg via ORAL
  Filled 2014-12-10: qty 2

## 2014-12-10 MED ORDER — METOPROLOL SUCCINATE ER 25 MG PO TB24
25.0000 mg | ORAL_TABLET | Freq: Every day | ORAL | Status: DC
  Administered 2014-12-10: 25 mg via ORAL
  Filled 2014-12-10: qty 1

## 2014-12-10 MED ORDER — ENOXAPARIN SODIUM 40 MG/0.4ML ~~LOC~~ SOLN
40.0000 mg | Freq: Once | SUBCUTANEOUS | Status: AC
  Administered 2014-12-10: 40 mg via SUBCUTANEOUS
  Filled 2014-12-10: qty 0.4

## 2014-12-10 MED ORDER — DOCUSATE SODIUM 100 MG PO CAPS
100.0000 mg | ORAL_CAPSULE | Freq: Two times a day (BID) | ORAL | Status: DC
  Administered 2014-12-10: 100 mg via ORAL
  Filled 2014-12-10: qty 1

## 2014-12-10 MED ORDER — PANTOPRAZOLE SODIUM 40 MG PO TBEC
40.0000 mg | DELAYED_RELEASE_TABLET | Freq: Every day | ORAL | Status: DC
  Administered 2014-12-10: 40 mg via ORAL
  Filled 2014-12-10: qty 1

## 2014-12-10 MED ORDER — FLUTICASONE PROPIONATE 50 MCG/ACT NA SUSP
2.0000 | Freq: Every day | NASAL | Status: DC
  Filled 2014-12-10: qty 16

## 2014-12-10 MED ORDER — PREDNISONE 10 MG PO TABS
10.0000 mg | ORAL_TABLET | Freq: Every day | ORAL | Status: DC
  Administered 2014-12-10: 10 mg via ORAL
  Filled 2014-12-10: qty 1

## 2014-12-10 MED ORDER — CARBIDOPA-LEVODOPA 25-100 MG PO TABS
1.0000 | ORAL_TABLET | Freq: Four times a day (QID) | ORAL | Status: DC
  Administered 2014-12-10: 1 via ORAL
  Filled 2014-12-10: qty 1

## 2014-12-10 MED ORDER — HYDROCODONE-ACETAMINOPHEN 5-325 MG PO TABS: 1.0000 | ORAL_TABLET | Freq: Every day | ORAL | Status: DC | PRN

## 2014-12-10 MED ORDER — ACETAMINOPHEN 325 MG PO TABS: 325.0000 mg | ORAL_TABLET | Freq: Every day | ORAL | Status: DC | PRN

## 2014-12-10 MED ORDER — SODIUM CHLORIDE 0.9 % IJ SOLN
3.0000 mL | Freq: Two times a day (BID) | INTRAMUSCULAR | Status: DC
  Administered 2014-12-10: 3 mL via INTRAVENOUS

## 2014-12-10 MED ORDER — TRAMADOL HCL 50 MG PO TABS
50.0000 mg | ORAL_TABLET | Freq: Every day | ORAL | Status: DC
  Administered 2014-12-10: 50 mg via ORAL
  Filled 2014-12-10: qty 1

## 2014-12-10 MED ORDER — MORPHINE SULFATE 2 MG/ML IJ SOLN
2.0000 mg | INTRAMUSCULAR | Status: DC | PRN
  Administered 2014-12-10: 2 mg via INTRAVENOUS
  Filled 2014-12-10: qty 1

## 2014-12-10 MED ORDER — GLIPIZIDE 5 MG PO TABS
10.0000 mg | ORAL_TABLET | Freq: Two times a day (BID) | ORAL | Status: DC
  Administered 2014-12-10: 10 mg via ORAL
  Filled 2014-12-10: qty 2

## 2014-12-10 MED ORDER — MOMETASONE FURO-FORMOTEROL FUM 100-5 MCG/ACT IN AERO
2.0000 | INHALATION_SPRAY | Freq: Two times a day (BID) | RESPIRATORY_TRACT | Status: DC
  Filled 2014-12-10: qty 8.8

## 2014-12-10 MED ORDER — PANTOPRAZOLE SODIUM 40 MG PO TBEC: 40.0000 mg | DELAYED_RELEASE_TABLET | Freq: Every day | ORAL | Status: DC

## 2014-12-10 MED ORDER — FAMOTIDINE 20 MG PO TABS: 20.0000 mg | ORAL_TABLET | Freq: Every day | ORAL | Status: DC

## 2014-12-10 MED ORDER — IPRATROPIUM-ALBUTEROL 0.5-2.5 (3) MG/3ML IN SOLN: 3.0000 mL | Freq: Four times a day (QID) | RESPIRATORY_TRACT | Status: DC | PRN

## 2014-12-10 MED ORDER — LORATADINE 10 MG PO TABS
10.0000 mg | ORAL_TABLET | Freq: Every day | ORAL | Status: DC
  Administered 2014-12-10: 10 mg via ORAL
  Filled 2014-12-10: qty 1

## 2014-12-10 MED ORDER — OXYCODONE HCL 5 MG PO TABS
5.0000 mg | ORAL_TABLET | ORAL | Status: DC | PRN
  Administered 2014-12-10: 5 mg via ORAL
  Filled 2014-12-10: qty 1

## 2014-12-10 MED ORDER — INSULIN GLARGINE 100 UNIT/ML ~~LOC~~ SOLN
42.0000 [IU] | Freq: Every day | SUBCUTANEOUS | Status: DC
  Filled 2014-12-10 (×2): qty 0.42

## 2014-12-10 MED ORDER — SENNOSIDES-DOCUSATE SODIUM 8.6-50 MG PO TABS: 1.0000 | ORAL_TABLET | Freq: Every evening | ORAL | Status: DC | PRN

## 2014-12-10 MED ORDER — INSULIN ASPART 100 UNIT/ML ~~LOC~~ SOLN
0.0000 [IU] | Freq: Three times a day (TID) | SUBCUTANEOUS | Status: DC
  Administered 2014-12-10: 2 [IU] via SUBCUTANEOUS
  Filled 2014-12-10: qty 2

## 2014-12-10 MED ORDER — BRIMONIDINE TARTRATE-TIMOLOL 0.2-0.5 % OP SOLN
1.0000 [drp] | Freq: Two times a day (BID) | OPHTHALMIC | Status: DC
  Filled 2014-12-10: qty 5

## 2014-12-10 NOTE — H&P (Addendum)
[place] [facility] [place] NAME: Jimmy Hill    MR#:  [id number]  DATE OF BIRTH:  Mar 16, 1941  DATE OF ADMISSION:  12/09/2014  PRIMARY CARE PHYSICIAN: Out of town Primary cardiologist-Dr. Sabra Heck at [place] Dr. Archie Balboa:   CHIEF COMPLAINT:   Chest pain HISTORY OF PRESENT ILLNESS:  Jimmy Hill  is a 74 y.o. male with a known history of coronary artery disease status post 5 stents, diabetes mellitus and Parkinson's disease is present into the ED with a chief complaint of chest pain which was started at 5:30 PM today. Patient was resting at the time and he started having chest pain on the left side of the chest. He called 911 and took sublingual nitroglycerin with no improvement. EMS gave him 4 tablets of 81 mg of aspirin and nitroglycerin spray following which he got some relief of his chest pain but not completely. Nitropaste was addressed to the anterior chest wall in the ED. He states that the pain does radiate into his left arm. Denies any recent fevers. He does state that he has been recently treated for an upper respiratory infection with a Z-Pak and steroids  PAST MEDICAL HISTORY:  Coronary artery disease status post 5 stents Parkinson's disease Insulin requiring diabetes mellitus  PAST SURGICAL HISTOIRY:  Coronary artery stents-5  SOCIAL HISTORY:   Smokes less than half pack a day. Occasional intake of alcohol but denies any illicit drug usage Lives with wife  FAMILY HISTORY:  Diabetes and heart disease runs in his family  DRUG ALLERGIES:  No known drug allergies  REVIEW OF SYSTEMS:  CONSTITUTIONAL: No fever, fatigue or weakness.  EYES: No blurred or double vision.  EARS, NOSE, AND THROAT: No tinnitus or ear pain.  RESPIRATORY: No cough, shortness of breath, wheezing or hemoptysis.  CARDIOVASCULAR: some chest pain on the left side of the chest , denies  orthopnea, edema.  GASTROINTESTINAL: No  nausea, vomiting, diarrhea or abdominal pain.  GENITOURINARY: No dysuria, hematuria.  ENDOCRINE: No polyuria, nocturia,  HEMATOLOGY: No anemia, easy bruising or bleeding SKIN: No rash or lesion. MUSCULOSKELETAL: No joint pain or arthritis.   NEUROLOGIC: No tingling, numbness, weakness.  PSYCHIATRY: No anxiety or depression.   MEDICATIONS AT HOME:   Prior to Admission medications   Medication Sig Start Date End Date Taking? Authorizing Provider  acetaminophen (TYLENOL) 325 MG tablet Take 325 mg by mouth daily as needed. 09/03/11  Yes Historical Provider, MD  albuterol (PROVENTIL) (2.5 MG/3ML) 0.083% nebulizer solution Inhale 2.5 mg into the lungs every 4 (four) hours.   Yes Historical Provider, MD  aspirin 81 MG tablet Take 81 mg by mouth daily. 09/03/11  Yes Historical Provider, MD  atorvastatin (LIPITOR) 80 MG tablet Take 80 mg by mouth daily.   Yes Historical Provider, MD  brimonidine-timolol (COMBIGAN) 0.2-0.5 % ophthalmic solution Place 1 drop into both eyes 2 (two) times daily.   Yes Historical Provider, MD  buPROPion (BUDEPRION XL) 300 MG 24 hr tablet Take 300 mg by mouth daily.   Yes Historical Provider, MD  Calcium Carb-Cholecalciferol (CALCIUM-VITAMIN D3) 600-400 MG-UNIT CAPS Take 1 capsule by mouth daily.   Yes Historical Provider, MD  carbidopa-levodopa (SINEMET IR) 25-100 MG per tablet Take 1 tablet by mouth 4 (four) times daily.   Yes Historical Provider, MD  Carboxymethylcellul-Glycerin (OPTIVE) 0.5-0.9 % SOLN Place 2 drops into both eyes 3 (three) times daily.   Yes Historical Provider, MD  cetirizine (ZYRTEC) 10 MG tablet  Take 10 mg by mouth daily.   Yes Historical Provider, MD  clopidogrel (PLAVIX) 75 MG tablet Take 75 mg by mouth daily.   Yes Historical Provider, MD  dextrose ([employer] GLUCOSE) 40 % GEL Take 15 g by mouth daily as needed.   Yes Historical Provider, MD  esomeprazole (NEXIUM) 40 MG capsule Take 40 mg by mouth daily. 06/13/10  Yes Historical Provider, MD  fluticasone  (FLONASE) 50 MCG/ACT nasal spray Place 2 sprays into both nostrils daily. 09/03/11  Yes Historical Provider, MD  Fluticasone-Salmeterol (ADVAIR DISKUS) 250-50 MCG/DOSE AEPB Inhale 1 puff into the lungs 2 (two) times daily. 02/05/12  Yes Historical Provider, MD  gabapentin (NEURONTIN) 800 MG tablet Take 800 mg by mouth 3 (three) times daily.   Yes Historical Provider, MD  glipiZIDE (GLUCOTROL) 10 MG tablet Take 10 mg by mouth 2 (two) times daily.   Yes Historical Provider, MD  guaiFENesin (MUCINEX) 600 MG 12 hr tablet Take 600 mg by mouth daily. 02/05/12  Yes Historical Provider, MD  HYDROcodone-acetaminophen (NORCO/VICODIN) 5-325 MG per tablet Take 1 tablet by mouth daily as needed.   Yes Historical Provider, MD  hydrocortisone 2.5 % ointment Apply 1 application topically daily as needed.   Yes Historical Provider, MD  insulin glargine (LANTUS) 100 UNIT/ML injection Inject 42 Units into the skin at bedtime.   Yes Historical Provider, MD  ipratropium-albuterol (DUONEB) 0.5-2.5 (3) MG/3ML SOLN Inhale 3 mLs into the lungs daily as needed. 02/05/12  Yes Historical Provider, MD  lidocaine (LIDODERM) 5 % Place 1 patch onto the skin every 12 (twelve) hours.   Yes Historical Provider, MD  metFORMIN (GLUCOPHAGE) 1000 MG tablet Take 2,000 mg by mouth every morning.   Yes Historical Provider, MD  metoprolol succinate (TOPROL-XL) 25 MG 24 hr tablet Take 25 mg by mouth daily. 10/15/13  Yes Historical Provider, MD  nitroGLYCERIN (NITROSTAT) 0.4 MG SL tablet Place 0.4 mg under the tongue as needed. 07/23/09  Yes Historical Provider, MD  pantoprazole (PROTONIX) 40 MG tablet Take 40 mg by mouth daily.   Yes Historical Provider, MD  predniSONE (STERAPRED UNI-PAK 21 TAB) 10 MG (21) TBPK tablet Take 10 mg by mouth daily.   Yes Historical Provider, MD  ranitidine (ZANTAC) 150 MG tablet Take 150 mg by mouth at bedtime.   Yes Historical Provider, MD  tiotropium (SPIRIVA HANDIHALER) 18 MCG inhalation capsule Place 1 puff into  inhaler and inhale daily. 09/03/11  Yes Historical Provider, MD  traMADol (ULTRAM) 50 MG tablet Take 50 mg by mouth daily. 02/14/12  Yes Historical Provider, MD  vitamin B-12 (CYANOCOBALAMIN) 1000 MCG tablet Take 1,000 mcg by mouth daily. 09/12/09  Yes Historical Provider, MD      VITAL SIGNS:  Blood pressure 129/76, pulse 105, temperature 97.7 F (36.5 C), temperature source Oral, resp. rate 19, height 5\' 8"  (1.727 m), weight 90.719 kg (200 lb), SpO2 94 %.  PHYSICAL EXAMINATION:  GENERAL:  74 y.o.-year-old patient lying in the bed with no acute distress.  EYES: Pupils equal, round, reactive to light and accommodation. No scleral icterus. Extraocular muscles intact.  HEENT: Head atraumatic, normocephalic. Oropharynx and nasopharynx clear.  NECK:  Supple, no jugular venous distention. No thyroid enlargement, no tenderness.  LUNGS: Normal breath sounds bilaterally, no wheezing, rales,rhonchi or crepitation. No use of accessory muscles of respiration.  CARDIOVASCULAR: S1, S2 normal. No murmurs, rubs, or gallops.  reproducible chest pain on the left anterior chest wall  ABDOMEN: Soft, nontender, nondistended. Bowel sounds present. No organomegaly or  mass.  EXTREMITIES: No pedal edema, cyanosis, or clubbing.  NEUROLOGIC: Cranial nerves II through XII are intact. Muscle strength 5/5 in all extremities. Sensation intact. Gait not checked.  resting tremors  PSYCHIATRIC: The patient is alert and oriented x 3.  SKIN: No obvious rash, lesion, or ulcer.   LABORATORY PANEL:   CBC  Recent Labs Lab 12/09/14 2013  WBC 6.1  HGB 13.5  HCT 41.4  PLT 218   ------------------------------------------------------------------------------------------------------------------  Chemistries   Recent Labs Lab 12/09/14 2013  NA 132*  K 4.8  CL 97*  CO2 21*  GLUCOSE 461*  BUN 20  CREATININE 1.47*  CALCIUM 8.4*    ------------------------------------------------------------------------------------------------------------------  Cardiac Enzymes  Recent Labs Lab 12/09/14 2013  TROPONINI <0.03   ------------------------------------------------------------------------------------------------------------------  EKG with accelerated junctional rhythm at 1 16 bpm RADIOLOGY:  Dg Chest Portable 1 View  12/09/2014   CLINICAL DATA:  Left-sided chest pain beginning 2 hr ago.  EXAM: PORTABLE CHEST - 1 VIEW  COMPARISON:  12/07/2013  FINDINGS: Cardiomediastinal silhouette is unchanged and within normal limits. The lungs are well inflated with interval clearing of minimal right basilar opacity described on the prior study. Minimal scarring or atelectasis is noted in the left lung base, likely not significantly changed. No segmental airspace consolidation, edema, pleural effusion, or pneumothorax is identified. Thoracic spondylosis is noted.  IMPRESSION: No active disease.   Electronically Signed   By: Logan Bores   On: 12/09/2014 21:13    EKG:   Orders placed or performed in visit on 12/07/13  . EKG 12-Lead    IMPRESSION AND PLAN:   Jimmy Hill  is a 74 y.o. male with a known history of coronary artery disease status post 5 stents, diabetes mellitus and Parkinson's disease is present into the ED with a chief complaint of chest pain which was started at 5:30 PM today,while he was resting at home. Patient's chest pain was somewhat relieved after Nitropaste was better after applying Nitropaste to  the anterior chest wall in the ED  1. Atypical chest pain with history of coronary artery disease and stents  chest pain is reproducible but patient has significant history of coronary artery disease status post 5 stents, diabetic mellitus and hypertension. Given his risk factors will admit him under observation status to rule out acute coronary syndrome Tele monitor Cycle cardiac biomarkers Provide aspirin, beta  blocker and statin. Cardiology consult is placed to Dr. Saralyn Pilar  2. History of Parkinson's disease Continue Sinemet  3. Insulin requiring diabetes mellitus Continue Lantus Hold oral antidiabetic agents Accu-Cheks and sliding scale insulin Diabetic diet  4. Chronic hypertension Continue home medications  metoprolol and titrate as needed basis  5. Acute kidney injury Gentle hydration with IV fluids Hold metformin BMP a.m.  6. Tobacco abuse disorder Consultation to quit smoking for 3-5 minutes. We will provide nicotine patch after he gets ruled out.  Provide GI and DVT prophylaxis   All the records are reviewed and case discussed with ED provider. Management plans discussed with the patient, family and they are in agreement.  CODE STATUS:   TOTAL TIME TAKING CARE OF THIS PATIENT, reviewing medical records, labs, history and physical, admission orders and coordination of care: 45 minutes.    Nicholes Mango M.D on 12/10/2014 at 12:23 AM  Between 7am to 6pm - Pager - [phone]  After 6pm go to [email or web address] - password [email or web address] [facility]  [facility]  Office  [phone]  CC: Primary care physician; No primary care provider on  file.

## 2014-12-10 NOTE — Discharge Instructions (Signed)

## 2014-12-10 NOTE — Clinical Social Work Note (Signed)
CSW consulted for transportation home at discharge. Pt is an [facility]. CSW called [facility] and they do not have transportation on the weekends. CSW provided cab vouchure as pt does not have transportation home and does not require EMS. CSW is signing off as no further needs identified.   Darden Dates, MSW, LCSW Clinical Social Worker  [phone]

## 2014-12-10 NOTE — Progress Notes (Signed)
Skin verified on admission with Cristopher Peru., RN

## 2014-12-10 NOTE — Discharge Summary (Signed)
[facility] NAME: Jimmy Hill    MR#:  [id number]  DATE OF BIRTH:  1940-10-10  DATE OF ADMISSION:  12/09/2014 ADMITTING PHYSICIAN: Nicholes Mango, MD  DATE OF DISCHARGE: 12/10/2014  1:35 PM  PRIMARY CARE PHYSICIAN: Gareth Morgan, MD    ADMISSION DIAGNOSIS:  Chest pain, unspecified chest pain type [R07.9]  DISCHARGE DIAGNOSIS:  Active Problems:   Chest pain   SECONDARY DIAGNOSIS:   Past Medical History  Diagnosis Date  . Myocardial infarction   . Coronary artery disease   . Anginal pain     HOSPITAL COURSE:   74 year old male with past medical history of diabetes, diabetic neuropathy, history of coronary disease status post stent, history of previous MI, GERD, depression, glaucoma who presented to the hospital with chest pain.  #1 chest pain-patient's chest pain was quite atypical and reproducible. Although given his risk factors he was observed overnight on telemetry. He had 3 sets of cardiac markers checked which are negative. -His pain is somewhat atypical and not consistent with anginal pain and likely musculoskeletal pain and therefore is being discharged home.  #2 diabetes without complication-patient had no evidence of hypoglycemia. -Patient will continue his glipizide, Lantus, metformin.  #3 diabetic neuropathy-patient will continue his Neurontin.  #4 history of chronic disease status post stent placement-he had 3 sets of cardiac markers checked which were negative for any acute ischemia. -He will continue his aspirin, Plavix, statin, beta blocker.  #5 GERD-patient will continue his ranitidine, Protonix.  #6 glaucoma-patient will continue his brimonidine and timolol eyedrops  #7 Parkinson's disease-patient will continue his Sinemet.  Patient is followed by the pace program and he will continue that.  DISCHARGE CONDITIONS:   Stable  CONSULTS OBTAINED:  Treatment Team:  Nicholes Mango, MD  DRUG  ALLERGIES:  No Known Allergies  DISCHARGE MEDICATIONS:   Discharge Medication List as of 12/10/2014 10:53 AM    CONTINUE these medications which have NOT CHANGED   Details  acetaminophen (TYLENOL) 325 MG tablet Take 325 mg by mouth daily as needed., Starting 09/03/2011, Until Discontinued, Historical Med    albuterol (PROVENTIL) (2.5 MG/3ML) 0.083% nebulizer solution Inhale 2.5 mg into the lungs every 4 (four) hours., Until Discontinued, Historical Med    aspirin 81 MG tablet Take 81 mg by mouth daily., Starting 09/03/2011, Until Discontinued, Historical Med    atorvastatin (LIPITOR) 80 MG tablet Take 80 mg by mouth daily., Until Discontinued, Historical Med    brimonidine-timolol (COMBIGAN) 0.2-0.5 % ophthalmic solution Place 1 drop into both eyes 2 (two) times daily., Until Discontinued, Historical Med    buPROPion (BUDEPRION XL) 300 MG 24 hr tablet Take 300 mg by mouth daily., Until Discontinued, Historical Med    Calcium Carb-Cholecalciferol (CALCIUM-VITAMIN D3) 600-400 MG-UNIT CAPS Take 1 capsule by mouth daily., Until Discontinued, Historical Med    carbidopa-levodopa (SINEMET IR) 25-100 MG per tablet Take 1 tablet by mouth 4 (four) times daily., Until Discontinued, Historical Med    Carboxymethylcellul-Glycerin (OPTIVE) 0.5-0.9 % SOLN Place 2 drops into both eyes 3 (three) times daily., Until Discontinued, Historical Med    cetirizine (ZYRTEC) 10 MG tablet Take 10 mg by mouth daily., Until Discontinued, Historical Med    clopidogrel (PLAVIX) 75 MG tablet Take 75 mg by mouth daily., Until Discontinued, Historical Med    dextrose ([employer] GLUCOSE) 40 % GEL Take 15 g by mouth daily as needed., Until Discontinued, Historical Med    esomeprazole (NEXIUM) 40 MG capsule  Take 40 mg by mouth daily., Starting 06/13/2010, Until Discontinued, Historical Med    fluticasone (FLONASE) 50 MCG/ACT nasal spray Place 2 sprays into both nostrils daily., Starting 09/03/2011, Until Discontinued,  Historical Med    Fluticasone-Salmeterol (ADVAIR DISKUS) 250-50 MCG/DOSE AEPB Inhale 1 puff into the lungs 2 (two) times daily., Starting 02/05/2012, Until Discontinued, Historical Med    gabapentin (NEURONTIN) 800 MG tablet Take 800 mg by mouth 3 (three) times daily., Until Discontinued, Historical Med    glipiZIDE (GLUCOTROL) 10 MG tablet Take 10 mg by mouth 2 (two) times daily., Until Discontinued, Historical Med    guaiFENesin (MUCINEX) 600 MG 12 hr tablet Take 600 mg by mouth daily., Starting 02/05/2012, Until Discontinued, Historical Med    HYDROcodone-acetaminophen (NORCO/VICODIN) 5-325 MG per tablet Take 1 tablet by mouth daily as needed., Until Discontinued, Historical Med    hydrocortisone 2.5 % ointment Apply 1 application topically daily as needed., Until Discontinued, Historical Med    insulin glargine (LANTUS) 100 UNIT/ML injection Inject 42 Units into the skin at bedtime., Until Discontinued, Historical Med    ipratropium-albuterol (DUONEB) 0.5-2.5 (3) MG/3ML SOLN Inhale 3 mLs into the lungs daily as needed., Starting 02/05/2012, Until Discontinued, Historical Med    lidocaine (LIDODERM) 5 % Place 1 patch onto the skin every 12 (twelve) hours., Until Discontinued, Historical Med    metFORMIN (GLUCOPHAGE) 1000 MG tablet Take 2,000 mg by mouth every morning., Until Discontinued, Historical Med    metoprolol succinate (TOPROL-XL) 25 MG 24 hr tablet Take 25 mg by mouth daily., Starting 10/15/2013, Until Discontinued, Historical Med    nitroGLYCERIN (NITROSTAT) 0.4 MG SL tablet Place 0.4 mg under the tongue as needed., Starting 07/23/2009, Until Discontinued, Historical Med    pantoprazole (PROTONIX) 40 MG tablet Take 40 mg by mouth daily., Until Discontinued, Historical Med    predniSONE (STERAPRED UNI-PAK 21 TAB) 10 MG (21) TBPK tablet Take 10 mg by mouth daily., Until Discontinued, Historical Med    ranitidine (ZANTAC) 150 MG tablet Take 150 mg by mouth at bedtime., Until  Discontinued, Historical Med    tiotropium (SPIRIVA HANDIHALER) 18 MCG inhalation capsule Place 1 puff into inhaler and inhale daily., Starting 09/03/2011, Until Discontinued, Historical Med    traMADol (ULTRAM) 50 MG tablet Take 50 mg by mouth daily., Starting 02/14/2012, Until Discontinued, Historical Med    vitamin B-12 (CYANOCOBALAMIN) 1000 MCG tablet Take 1,000 mcg by mouth daily., Starting 09/12/2009, Until Discontinued, Historical Med         DISCHARGE INSTRUCTIONS:   DIET:  Cardiac diet and Diabetic diet  DISCHARGE CONDITION:  Good  ACTIVITY:  Activity as tolerated  OXYGEN:  Home Oxygen: No.   Oxygen Delivery: room air  DISCHARGE LOCATION:  home   If you experience worsening of your admission symptoms, develop shortness of breath, life threatening emergency, suicidal or homicidal thoughts you must seek medical attention immediately by calling 911 or calling your MD immediately  if symptoms less severe.  You Must read complete instructions/literature along with all the possible adverse reactions/side effects for all the Medicines you take and that have been prescribed to you. Take any new Medicines after you have completely understood and accpet all the possible adverse reactions/side effects.   Please note  You were cared for by a hospitalist during your hospital stay. If you have any questions about your discharge medications or the care you received while you were in the hospital after you are discharged, you can call the unit and asked to speak  with the hospitalist on call if the hospitalist that took care of you is not available. Once you are discharged, your primary care physician will handle any further medical issues. Please note that NO REFILLS for any discharge medications will be authorized once you are discharged, as it is imperative that you return to your primary care physician (or establish a relationship with a primary care physician if you do not have one)  for your aftercare needs so that they can reassess your need for medications and monitor your lab values.     Today   Still has some chest pain but is reproducible. No nausea, vomiting. No other complaints presently.  VITAL SIGNS:  Blood pressure 126/67, pulse 91, temperature 97.6 F (36.4 C), temperature source Oral, resp. rate 20, height 5\' 8"  (1.727 m), weight 93.486 kg (206 lb 1.6 oz), SpO2 92 %.  I/O:   Intake/Output Summary (Last 24 hours) at 12/10/14 1414 Last data filed at 12/10/14 1050  Gross per 24 hour  Intake    703 ml  Output    350 ml  Net    353 ml    PHYSICAL EXAMINATION:  GENERAL:  74 y.o.-year-old patient lying in the bed with no acute distress.  EYES: Pupils equal, round, reactive to light and accommodation. No scleral icterus. Extraocular muscles intact.  HEENT: Head atraumatic, normocephalic. Oropharynx and nasopharynx clear.  NECK:  Supple, no jugular venous distention. No thyroid enlargement, no tenderness.  LUNGS: Normal breath sounds bilaterally, no wheezing, rales,rhonchi. No use of accessory muscles of respiration.  CARDIOVASCULAR: S1, S2 normal. No murmurs, rubs, or gallops.  ABDOMEN: Soft, non-tender, non-distended. Bowel sounds present. No organomegaly or mass.  EXTREMITIES: No pedal edema, cyanosis, or clubbing.  NEUROLOGIC: Cranial nerves II through XII are intact. No focal motor or sensory defecits b/l. Positive tremor consistent with Parkinson's disease PSYCHIATRIC: The patient is alert and oriented x 3. Good affect.  SKIN: No obvious rash, lesion, or ulcer.   DATA REVIEW:   CBC  Recent Labs Lab 12/10/14 0712  WBC 13.8*  HGB 12.5*  HCT 38.5*  PLT 202    Chemistries   Recent Labs Lab 12/10/14 0712  NA 141  K 4.0  CL 108  CO2 26  GLUCOSE 182*  BUN 19  CREATININE 1.09  CALCIUM 8.3*  AST 28  ALT 23  ALKPHOS 55  BILITOT 0.6    Cardiac Enzymes  Recent Labs Lab 12/10/14 0712  TROPONINI <0.03    Microbiology Results   Results for orders placed or performed in visit on 12/07/13  Culture, blood (single)     Status: None   Collection Time: 12/07/13 10:18 PM  Result Value Ref Range Status   Micro Text Report   Final       COMMENT                   NO GROWTH AEROBICALLY/ANAEROBICALLY IN 5 DAYS   ANTIBIOTIC                                                      Culture, blood (single)     Status: None   Collection Time: 12/07/13 10:18 PM  Result Value Ref Range Status   Micro Text Report   Final       COMMENT  NO GROWTH AEROBICALLY/ANAEROBICALLY IN 5 DAYS   ANTIBIOTIC                                                        RADIOLOGY:  Dg Chest Portable 1 View  12/09/2014   CLINICAL DATA:  Left-sided chest pain beginning 2 hr ago.  EXAM: PORTABLE CHEST - 1 VIEW  COMPARISON:  12/07/2013  FINDINGS: Cardiomediastinal silhouette is unchanged and within normal limits. The lungs are well inflated with interval clearing of minimal right basilar opacity described on the prior study. Minimal scarring or atelectasis is noted in the left lung base, likely not significantly changed. No segmental airspace consolidation, edema, pleural effusion, or pneumothorax is identified. Thoracic spondylosis is noted.  IMPRESSION: No active disease.   Electronically Signed   By: Logan Bores   On: 12/09/2014 21:13      Management plans discussed with the patient, family and they are in agreement.  CODE STATUS:     Code Status Orders        Start     Ordered   12/10/14 0047  Full code   Continuous     12/10/14 0047      TOTAL TIME TAKING CARE OF THIS PATIENT: 40 minutes.    Henreitta Leber M.D on 12/10/2014 at 2:14 PM  Between 7am to 6pm - Pager - [phone]  After 6pm go to [email or web address] - password [email or web address] [facility]  Office  [phone]  CC: Primary care physician; Gareth Morgan, MD

## 2014-12-10 NOTE — Progress Notes (Signed)
*  PRELIMINARY RESULTS* Echocardiogram 2D Echocardiogram has been performed.  Jimmy Hill 12/10/2014, 9:16 AM

## 2015-01-29 ENCOUNTER — Other Ambulatory Visit: Payer: Self-pay | Admitting: Family

## 2015-01-29 ENCOUNTER — Ambulatory Visit
Admission: RE | Admit: 2015-01-29 | Discharge: 2015-01-29 | Disposition: A | Discharge status: Home / Self Care | Payer: Medicare (Managed Care) | Source: Ambulatory Visit | Attending: Family | Admitting: Family

## 2015-01-29 DIAGNOSIS — M79605 Pain in left leg: Secondary | ICD-10-CM | POA: Diagnosis not present

## 2015-02-05 ENCOUNTER — Encounter: Payer: Self-pay | Admitting: Emergency Medicine

## 2015-02-05 ENCOUNTER — Emergency Department: Payer: Medicare (Managed Care)

## 2015-02-05 ENCOUNTER — Observation Stay
Admission: EM | Admit: 2015-02-05 | Discharge: 2015-02-06 | Disposition: A | Discharge status: Other Acute Inpatient Hospital | Payer: Medicare (Managed Care) | Attending: Internal Medicine | Admitting: Internal Medicine

## 2015-02-05 DIAGNOSIS — E1165 Type 2 diabetes mellitus with hyperglycemia: Secondary | ICD-10-CM | POA: Diagnosis not present

## 2015-02-05 DIAGNOSIS — R091 Pleurisy: Secondary | ICD-10-CM | POA: Insufficient documentation

## 2015-02-05 DIAGNOSIS — E785 Hyperlipidemia, unspecified: Secondary | ICD-10-CM | POA: Insufficient documentation

## 2015-02-05 DIAGNOSIS — I252 Old myocardial infarction: Secondary | ICD-10-CM | POA: Diagnosis not present

## 2015-02-05 DIAGNOSIS — E119 Type 2 diabetes mellitus without complications: Secondary | ICD-10-CM

## 2015-02-05 DIAGNOSIS — Z794 Long term (current) use of insulin: Secondary | ICD-10-CM | POA: Diagnosis not present

## 2015-02-05 DIAGNOSIS — Z7982 Long term (current) use of aspirin: Secondary | ICD-10-CM | POA: Diagnosis not present

## 2015-02-05 DIAGNOSIS — Z7902 Long term (current) use of antithrombotics/antiplatelets: Secondary | ICD-10-CM | POA: Diagnosis not present

## 2015-02-05 DIAGNOSIS — Z955 Presence of coronary angioplasty implant and graft: Secondary | ICD-10-CM | POA: Diagnosis not present

## 2015-02-05 DIAGNOSIS — G20 Parkinson's disease: Secondary | ICD-10-CM | POA: Insufficient documentation

## 2015-02-05 DIAGNOSIS — R079 Chest pain, unspecified: Secondary | ICD-10-CM | POA: Diagnosis present

## 2015-02-05 DIAGNOSIS — I25110 Atherosclerotic heart disease of native coronary artery with unstable angina pectoris: Secondary | ICD-10-CM | POA: Diagnosis not present

## 2015-02-05 DIAGNOSIS — Z79899 Other long term (current) drug therapy: Secondary | ICD-10-CM | POA: Insufficient documentation

## 2015-02-05 DIAGNOSIS — Z7951 Long term (current) use of inhaled steroids: Secondary | ICD-10-CM | POA: Insufficient documentation

## 2015-02-05 DIAGNOSIS — F17210 Nicotine dependence, cigarettes, uncomplicated: Secondary | ICD-10-CM | POA: Diagnosis not present

## 2015-02-05 DIAGNOSIS — H409 Unspecified glaucoma: Secondary | ICD-10-CM | POA: Insufficient documentation

## 2015-02-05 DIAGNOSIS — I10 Essential (primary) hypertension: Secondary | ICD-10-CM | POA: Insufficient documentation

## 2015-02-05 DIAGNOSIS — J441 Chronic obstructive pulmonary disease with (acute) exacerbation: Secondary | ICD-10-CM | POA: Insufficient documentation

## 2015-02-05 DIAGNOSIS — K219 Gastro-esophageal reflux disease without esophagitis: Secondary | ICD-10-CM | POA: Insufficient documentation

## 2015-02-05 DIAGNOSIS — J440 Chronic obstructive pulmonary disease with acute lower respiratory infection: Secondary | ICD-10-CM | POA: Diagnosis not present

## 2015-02-05 DIAGNOSIS — E1140 Type 2 diabetes mellitus with diabetic neuropathy, unspecified: Secondary | ICD-10-CM | POA: Insufficient documentation

## 2015-02-05 DIAGNOSIS — R0602 Shortness of breath: Secondary | ICD-10-CM | POA: Diagnosis not present

## 2015-02-05 DIAGNOSIS — J449 Chronic obstructive pulmonary disease, unspecified: Secondary | ICD-10-CM

## 2015-02-05 DIAGNOSIS — J209 Acute bronchitis, unspecified: Secondary | ICD-10-CM

## 2015-02-05 LAB — COMPREHENSIVE METABOLIC PANEL
ALK PHOS: 68 U/L (ref 38–126)
ALT: <5 U/L — ABNORMAL LOW (ref 17–63)
ANION GAP: 8 (ref 5–15)
AST: 31 U/L (ref 15–41)
Albumin: 3.4 g/dL — ABNORMAL LOW (ref 3.5–5.0)
BUN: 20 mg/dL (ref 6–20)
CO2: 28 mmol/L (ref 22–32)
Calcium: 8.9 mg/dL (ref 8.9–10.3)
Chloride: 101 mmol/L (ref 101–111)
Creatinine, Ser: 1.01 mg/dL (ref 0.61–1.24)
GFR calc non Af Amer: >60 mL/min (ref >60)
Glucose, Bld: 350 mg/dL — ABNORMAL HIGH (ref 65–99)
Potassium: 4.6 mmol/L (ref 3.5–5.1)
SODIUM: 137 mmol/L (ref 135–145)
Total Bilirubin: 0.5 mg/dL (ref 0.3–1.2)
Total Protein: 7.1 g/dL (ref 6.5–8.1)

## 2015-02-05 LAB — GLUCOSE, CAPILLARY
GLUCOSE-CAPILLARY: 249 mg/dL — AB (ref 65–99)
Glucose-Capillary: 289 mg/dL — ABNORMAL HIGH (ref 65–99)

## 2015-02-05 LAB — BRAIN NATRIURETIC PEPTIDE: B NATRIURETIC PEPTIDE 5: 64 pg/mL (ref 0.0–100.0)

## 2015-02-05 LAB — PROTIME-INR
INR: 1.09
PROTHROMBIN TIME: 14.3 s (ref 11.4–15.0)

## 2015-02-05 LAB — CBC WITH DIFFERENTIAL/PLATELET
Basophils Absolute: 0.1 10*3/uL (ref 0–0.1)
Basophils Relative: 1 %
Eosinophils Absolute: 0.2 10*3/uL (ref 0–0.7)
Eosinophils Relative: 3 %
HCT: 43.2 % (ref 40.0–52.0)
HEMOGLOBIN: 14.3 g/dL (ref 13.0–18.0)
LYMPHS ABS: 3 10*3/uL (ref 1.0–3.6)
Lymphocytes Relative: 38 %
MCH: 28.8 pg (ref 26.0–34.0)
MCHC: 33 g/dL (ref 32.0–36.0)
MCV: 87.1 fL (ref 80.0–100.0)
MONO ABS: 0.7 10*3/uL (ref 0.2–1.0)
MONOS PCT: 9 %
Neutro Abs: 3.8 10*3/uL (ref 1.4–6.5)
Neutrophils Relative %: 49 %
PLATELETS: 175 10*3/uL (ref 150–440)
RBC: 4.96 MIL/uL (ref 4.40–5.90)
RDW: 15.1 % — ABNORMAL HIGH (ref 11.5–14.5)
WBC: 7.8 10*3/uL (ref 3.8–10.6)

## 2015-02-05 LAB — APTT: aPTT: 38 seconds — ABNORMAL HIGH (ref 24–36)

## 2015-02-05 LAB — TROPONIN I
Troponin I: <0.03 ng/mL (ref <0.031)
Troponin I: <0.03 ng/mL (ref <0.031)

## 2015-02-05 MED ORDER — FLUTICASONE PROPIONATE 50 MCG/ACT NA SUSP
2.0000 | Freq: Every day | NASAL | Status: DC
  Administered 2015-02-05 – 2015-02-06 (×2): 2 via NASAL
  Filled 2015-02-05 (×2): qty 16

## 2015-02-05 MED ORDER — GUAIFENESIN ER 600 MG PO TB12
600.0000 mg | ORAL_TABLET | Freq: Every day | ORAL | Status: DC
  Administered 2015-02-05 – 2015-02-06 (×2): 600 mg via ORAL
  Filled 2015-02-05 (×2): qty 1

## 2015-02-05 MED ORDER — SODIUM CHLORIDE 0.9 % IJ SOLN
3.0000 mL | Freq: Two times a day (BID) | INTRAMUSCULAR | Status: DC
  Administered 2015-02-05 – 2015-02-06 (×2): 3 mL via INTRAVENOUS

## 2015-02-05 MED ORDER — INSULIN ASPART 100 UNIT/ML ~~LOC~~ SOLN
0.0000 [IU] | Freq: Three times a day (TID) | SUBCUTANEOUS | Status: DC
  Administered 2015-02-05: 8 [IU] via SUBCUTANEOUS
  Administered 2015-02-06: 11 [IU] via SUBCUTANEOUS
  Filled 2015-02-05: qty 8
  Filled 2015-02-05: qty 15

## 2015-02-05 MED ORDER — TIOTROPIUM BROMIDE MONOHYDRATE 18 MCG IN CAPS
18.0000 ug | ORAL_CAPSULE | Freq: Every day | RESPIRATORY_TRACT | Status: DC
  Administered 2015-02-05 – 2015-02-06 (×2): 18 ug via RESPIRATORY_TRACT
  Filled 2015-02-05: qty 5

## 2015-02-05 MED ORDER — NICOTINE 10 MG IN INHA: 1.0000 | RESPIRATORY_TRACT | Status: DC | PRN

## 2015-02-05 MED ORDER — ONDANSETRON HCL 4 MG PO TABS
4.0000 mg | ORAL_TABLET | Freq: Four times a day (QID) | ORAL | Status: DC | PRN
Start: 2015-02-05 — End: 2015-02-06

## 2015-02-05 MED ORDER — CARBIDOPA-LEVODOPA 25-100 MG PO TABS
1.0000 | ORAL_TABLET | Freq: Four times a day (QID) | ORAL | Status: DC
  Administered 2015-02-05 – 2015-02-06 (×3): 1 via ORAL
  Filled 2015-02-05 (×3): qty 1

## 2015-02-05 MED ORDER — INSULIN ASPART 100 UNIT/ML ~~LOC~~ SOLN
0.0000 [IU] | Freq: Every day | SUBCUTANEOUS | Status: DC
  Administered 2015-02-05: 2 [IU] via SUBCUTANEOUS
  Filled 2015-02-05 (×2): qty 2

## 2015-02-05 MED ORDER — SODIUM CHLORIDE 0.9 % WEIGHT BASED INFUSION: 3.0000 mL/kg/h | INTRAVENOUS | Status: AC

## 2015-02-05 MED ORDER — ASPIRIN 81 MG PO CHEW
CHEWABLE_TABLET | ORAL | Status: AC
  Filled 2015-02-05: qty 3

## 2015-02-05 MED ORDER — CLOPIDOGREL BISULFATE 75 MG PO TABS
75.0000 mg | ORAL_TABLET | Freq: Every day | ORAL | Status: DC
  Administered 2015-02-05 – 2015-02-06 (×2): 75 mg via ORAL
  Filled 2015-02-05 (×2): qty 1

## 2015-02-05 MED ORDER — SODIUM CHLORIDE 0.9 % IV SOLN: 250.0000 mL | INTRAVENOUS | Status: DC | PRN

## 2015-02-05 MED ORDER — LORATADINE 10 MG PO TABS
10.0000 mg | ORAL_TABLET | Freq: Every day | ORAL | Status: DC
  Administered 2015-02-05 – 2015-02-06 (×2): 10 mg via ORAL
  Filled 2015-02-05 (×2): qty 1

## 2015-02-05 MED ORDER — CARBOXYMETHYLCELLUL-GLYCERIN 0.5-0.9 % OP SOLN: 2.0000 [drp] | Freq: Three times a day (TID) | OPHTHALMIC | Status: DC

## 2015-02-05 MED ORDER — BRIMONIDINE TARTRATE 0.2 % OP SOLN
1.0000 [drp] | Freq: Two times a day (BID) | OPHTHALMIC | Status: DC
  Administered 2015-02-05 – 2015-02-06 (×2): 1 [drp] via OPHTHALMIC
  Filled 2015-02-05: qty 5

## 2015-02-05 MED ORDER — SODIUM CHLORIDE 0.9 % WEIGHT BASED INFUSION: 1.0000 mL/kg/h | INTRAVENOUS | Status: DC

## 2015-02-05 MED ORDER — INSULIN ASPART 100 UNIT/ML ~~LOC~~ SOLN
4.0000 [IU] | Freq: Three times a day (TID) | SUBCUTANEOUS | Status: DC
Start: 2015-02-05 — End: 2015-02-06
  Administered 2015-02-05 – 2015-02-06 (×2): 4 [IU] via SUBCUTANEOUS
  Filled 2015-02-05: qty 4

## 2015-02-05 MED ORDER — NITROGLYCERIN 0.4 MG SL SUBL
0.4000 mg | SUBLINGUAL_TABLET | Freq: Once | SUBLINGUAL | Status: AC
  Administered 2015-02-05: 0.4 mg via SUBLINGUAL
  Filled 2015-02-05: qty 1

## 2015-02-05 MED ORDER — NITROGLYCERIN 0.4 MG SL SUBL: 0.4000 mg | SUBLINGUAL_TABLET | SUBLINGUAL | Status: DC | PRN

## 2015-02-05 MED ORDER — NICOTINE 7 MG/24HR TD PT24
7.0000 mg | MEDICATED_PATCH | Freq: Every day | TRANSDERMAL | Status: DC
  Administered 2015-02-05 – 2015-02-06 (×2): 7 mg via TRANSDERMAL
  Filled 2015-02-05 (×2): qty 1

## 2015-02-05 MED ORDER — ACETAMINOPHEN 325 MG PO TABS: 650.0000 mg | ORAL_TABLET | Freq: Four times a day (QID) | ORAL | Status: DC | PRN

## 2015-02-05 MED ORDER — LIDOCAINE 5 % EX PTCH
1.0000 | MEDICATED_PATCH | Freq: Two times a day (BID) | CUTANEOUS | Status: DC
  Filled 2015-02-05 (×3): qty 1

## 2015-02-05 MED ORDER — METFORMIN HCL 500 MG PO TABS
2000.0000 mg | ORAL_TABLET | Freq: Every day | ORAL | Status: DC
  Administered 2015-02-06: 2000 mg via ORAL
  Filled 2015-02-05: qty 4

## 2015-02-05 MED ORDER — VITAMIN B-12 1000 MCG PO TABS
1000.0000 ug | ORAL_TABLET | Freq: Every day | ORAL | Status: DC
  Administered 2015-02-05 – 2015-02-06 (×2): 1000 ug via ORAL
  Filled 2015-02-05 (×2): qty 1

## 2015-02-05 MED ORDER — ACETAMINOPHEN 650 MG RE SUPP
650.0000 mg | Freq: Four times a day (QID) | RECTAL | Status: DC | PRN
Start: 2015-02-05 — End: 2015-02-06

## 2015-02-05 MED ORDER — POLYVINYL ALCOHOL 1.4 % OP SOLN
2.0000 [drp] | Freq: Three times a day (TID) | OPHTHALMIC | Status: DC
  Administered 2015-02-05 – 2015-02-06 (×3): 2 [drp] via OPHTHALMIC
  Filled 2015-02-05: qty 15

## 2015-02-05 MED ORDER — FAMOTIDINE 20 MG PO TABS
10.0000 mg | ORAL_TABLET | Freq: Every day | ORAL | Status: DC
  Administered 2015-02-05 – 2015-02-06 (×2): 10 mg via ORAL
  Filled 2015-02-05: qty 2
  Filled 2015-02-05: qty 1

## 2015-02-05 MED ORDER — ONDANSETRON HCL 4 MG/2ML IJ SOLN
4.0000 mg | Freq: Once | INTRAMUSCULAR | Status: AC
  Administered 2015-02-05: 4 mg via INTRAVENOUS
  Filled 2015-02-05: qty 2

## 2015-02-05 MED ORDER — TRAMADOL HCL 50 MG PO TABS
50.0000 mg | ORAL_TABLET | Freq: Every day | ORAL | Status: DC
  Administered 2015-02-05 – 2015-02-06 (×2): 50 mg via ORAL
  Filled 2015-02-05 (×2): qty 1

## 2015-02-05 MED ORDER — METHYLPREDNISOLONE SODIUM SUCC 125 MG IJ SOLR
60.0000 mg | Freq: Four times a day (QID) | INTRAMUSCULAR | Status: DC
  Administered 2015-02-05 – 2015-02-06 (×4): 60 mg via INTRAVENOUS
  Filled 2015-02-05 (×5): qty 2

## 2015-02-05 MED ORDER — ATORVASTATIN CALCIUM 20 MG PO TABS
80.0000 mg | ORAL_TABLET | Freq: Every day | ORAL | Status: DC
  Administered 2015-02-05 – 2015-02-06 (×2): 80 mg via ORAL
  Filled 2015-02-05 (×2): qty 4

## 2015-02-05 MED ORDER — PANTOPRAZOLE SODIUM 40 MG PO TBEC
40.0000 mg | DELAYED_RELEASE_TABLET | Freq: Every day | ORAL | Status: DC
  Administered 2015-02-05 – 2015-02-06 (×2): 40 mg via ORAL
  Filled 2015-02-05 (×2): qty 1

## 2015-02-05 MED ORDER — ASPIRIN 81 MG PO TABS: 81.0000 mg | ORAL_TABLET | Freq: Every day | ORAL | Status: DC

## 2015-02-05 MED ORDER — MOMETASONE FURO-FORMOTEROL FUM 100-5 MCG/ACT IN AERO
2.0000 | INHALATION_SPRAY | Freq: Two times a day (BID) | RESPIRATORY_TRACT | Status: DC
  Administered 2015-02-05 – 2015-02-06 (×2): 2 via RESPIRATORY_TRACT
  Filled 2015-02-05: qty 8.8

## 2015-02-05 MED ORDER — LEVOFLOXACIN IN D5W 500 MG/100ML IV SOLN
500.0000 mg | INTRAVENOUS | Status: DC
Start: 2015-02-05 — End: 2015-02-06
  Administered 2015-02-05: 500 mg via INTRAVENOUS
  Filled 2015-02-05 (×2): qty 100

## 2015-02-05 MED ORDER — GLIPIZIDE 5 MG PO TABS
10.0000 mg | ORAL_TABLET | Freq: Two times a day (BID) | ORAL | Status: DC
  Administered 2015-02-05 – 2015-02-06 (×2): 10 mg via ORAL
  Filled 2015-02-05 (×2): qty 2

## 2015-02-05 MED ORDER — MORPHINE SULFATE (PF) 4 MG/ML IV SOLN
4.0000 mg | Freq: Once | INTRAVENOUS | Status: AC
  Administered 2015-02-05: 4 mg via INTRAVENOUS
  Filled 2015-02-05: qty 1

## 2015-02-05 MED ORDER — METOPROLOL SUCCINATE ER 25 MG PO TB24
25.0000 mg | ORAL_TABLET | Freq: Every day | ORAL | Status: DC
  Administered 2015-02-05 – 2015-02-06 (×2): 25 mg via ORAL
  Filled 2015-02-05 (×2): qty 1

## 2015-02-05 MED ORDER — ASPIRIN 81 MG PO CHEW
324.0000 mg | CHEWABLE_TABLET | Freq: Once | ORAL | Status: AC
  Administered 2015-02-05: 324 mg via ORAL

## 2015-02-05 MED ORDER — INSULIN ASPART 100 UNIT/ML ~~LOC~~ SOLN
6.0000 [IU] | Freq: Once | SUBCUTANEOUS | Status: AC
  Administered 2015-02-05: 6 [IU] via INTRAVENOUS
  Filled 2015-02-05: qty 6

## 2015-02-05 MED ORDER — GABAPENTIN 800 MG PO TABS
800.0000 mg | ORAL_TABLET | Freq: Three times a day (TID) | ORAL | Status: DC
  Filled 2015-02-05 (×2): qty 1

## 2015-02-05 MED ORDER — INSULIN GLARGINE 100 UNIT/ML ~~LOC~~ SOLN
42.0000 [IU] | Freq: Every day | SUBCUTANEOUS | Status: DC
  Administered 2015-02-05: 42 [IU] via SUBCUTANEOUS
  Filled 2015-02-05 (×2): qty 0.42

## 2015-02-05 MED ORDER — TIMOLOL MALEATE 0.5 % OP SOLN
1.0000 [drp] | Freq: Two times a day (BID) | OPHTHALMIC | Status: DC
  Administered 2015-02-05 – 2015-02-06 (×2): 1 [drp] via OPHTHALMIC
  Filled 2015-02-05: qty 5

## 2015-02-05 MED ORDER — PANTOPRAZOLE SODIUM 40 MG PO TBEC: 40.0000 mg | DELAYED_RELEASE_TABLET | Freq: Every day | ORAL | Status: DC

## 2015-02-05 MED ORDER — ASPIRIN EC 325 MG PO TBEC
325.0000 mg | DELAYED_RELEASE_TABLET | Freq: Every day | ORAL | Status: DC
  Administered 2015-02-06: 325 mg via ORAL
  Filled 2015-02-05: qty 1

## 2015-02-05 MED ORDER — OXYCODONE HCL 5 MG PO TABS: 5.0000 mg | ORAL_TABLET | ORAL | Status: DC | PRN

## 2015-02-05 MED ORDER — SODIUM CHLORIDE 0.9 % IJ SOLN
3.0000 mL | Freq: Two times a day (BID) | INTRAMUSCULAR | Status: DC
  Administered 2015-02-05: 3 mL via INTRAVENOUS

## 2015-02-05 MED ORDER — ALBUTEROL SULFATE (2.5 MG/3ML) 0.083% IN NEBU
2.5000 mg | INHALATION_SOLUTION | RESPIRATORY_TRACT | Status: DC
  Administered 2015-02-05 – 2015-02-06 (×3): 2.5 mg via RESPIRATORY_TRACT
  Filled 2015-02-05 (×4): qty 3

## 2015-02-05 MED ORDER — ENOXAPARIN SODIUM 40 MG/0.4ML ~~LOC~~ SOLN
40.0000 mg | SUBCUTANEOUS | Status: DC
  Administered 2015-02-05: 40 mg via SUBCUTANEOUS
  Filled 2015-02-05: qty 0.4

## 2015-02-05 MED ORDER — ONDANSETRON HCL 4 MG/2ML IJ SOLN: 4.0000 mg | Freq: Four times a day (QID) | INTRAMUSCULAR | Status: DC | PRN

## 2015-02-05 MED ORDER — BUPROPION HCL ER (XL) 150 MG PO TB24
300.0000 mg | ORAL_TABLET | Freq: Every day | ORAL | Status: DC
  Administered 2015-02-05 – 2015-02-06 (×2): 300 mg via ORAL
  Filled 2015-02-05 (×2): qty 2

## 2015-02-05 MED ORDER — BRIMONIDINE TARTRATE-TIMOLOL 0.2-0.5 % OP SOLN: 1.0000 [drp] | Freq: Two times a day (BID) | OPHTHALMIC | Status: DC

## 2015-02-05 MED ORDER — GABAPENTIN 400 MG PO CAPS
800.0000 mg | ORAL_CAPSULE | Freq: Three times a day (TID) | ORAL | Status: DC
  Administered 2015-02-05 – 2015-02-06 (×3): 800 mg via ORAL
  Filled 2015-02-05 (×3): qty 2

## 2015-02-05 MED ORDER — IPRATROPIUM-ALBUTEROL 0.5-2.5 (3) MG/3ML IN SOLN: 3.0000 mL | RESPIRATORY_TRACT | Status: DC | PRN

## 2015-02-05 MED ORDER — HYDROCODONE-ACETAMINOPHEN 5-325 MG PO TABS: 1.0000 | ORAL_TABLET | Freq: Four times a day (QID) | ORAL | Status: DC | PRN

## 2015-02-05 MED ORDER — ASPIRIN 81 MG PO CHEW: 81.0000 mg | CHEWABLE_TABLET | ORAL | Status: DC

## 2015-02-05 MED ORDER — SODIUM CHLORIDE 0.9 % IJ SOLN: 3.0000 mL | INTRAMUSCULAR | Status: DC | PRN

## 2015-02-05 NOTE — ED Provider Notes (Signed)
[facility] Emergency Department Provider Note  ____________________________________________  Time seen: Approximately 12:29 PM  I have reviewed the triage vital signs and the nursing notes.   HISTORY  Chief Complaint Pleurisy    HPI Jimmy Hill is a 74 y.o. male with history of coronary artery disease status post multiple stents, the most recent of which DES to the RCA in May 2015, diabetes, COPD, GERD, glaucoma, parkinsonism who presents for evaluation of sharp left-sided chest pain, gradual onset, waxing and waning today since approximately 7 AM when he awoke from sleep. It is just short of breath and is slightly worse with exertion. He took 2 nitroglycerin at home and they only minimally helped his symptoms. Current severity of symptoms is moderate. He is also endorsing nonproductive cough. No nausea, vomiting, diarrhea, fevers or chills. The pain radiates into the left shoulder/left arm.   Past Medical History  Diagnosis Date  . Myocardial infarction   . Coronary artery disease   . Anginal pain     Patient Active Problem List   Diagnosis Date Noted  . Chest pain 12/09/2014    History reviewed. No pertinent past surgical history.  Current Outpatient Rx  Name  Route  Sig  Dispense  Refill  . acetaminophen (TYLENOL) 325 MG tablet   Oral   Take 325 mg by mouth daily as needed.         Marland Kitchen albuterol (PROVENTIL) (2.5 MG/3ML) 0.083% nebulizer solution   Inhalation   Inhale 2.5 mg into the lungs every 4 (four) hours.         Marland Kitchen aspirin 81 MG tablet   Oral   Take 81 mg by mouth daily.         Marland Kitchen atorvastatin (LIPITOR) 80 MG tablet   Oral   Take 80 mg by mouth daily.         . brimonidine-timolol (COMBIGAN) 0.2-0.5 % ophthalmic solution   Both Eyes   Place 1 drop into both eyes 2 (two) times daily.         Marland Kitchen buPROPion (BUDEPRION XL) 300 MG 24 hr tablet   Oral   Take 300 mg by mouth daily.         . Calcium Carb-Cholecalciferol  (CALCIUM-VITAMIN D3) 600-400 MG-UNIT CAPS   Oral   Take 1 capsule by mouth daily.         . carbidopa-levodopa (SINEMET IR) 25-100 MG per tablet   Oral   Take 1 tablet by mouth 4 (four) times daily.         . Carboxymethylcellul-Glycerin (OPTIVE) 0.5-0.9 % SOLN   Both Eyes   Place 2 drops into both eyes 3 (three) times daily.         . cetirizine (ZYRTEC) 10 MG tablet   Oral   Take 10 mg by mouth daily.         . clopidogrel (PLAVIX) 75 MG tablet   Oral   Take 75 mg by mouth daily.         Marland Kitchen dextrose ([employer]S GLUCOSE) 40 % GEL   Oral   Take 15 g by mouth daily as needed.         Marland Kitchen esomeprazole (NEXIUM) 40 MG capsule   Oral   Take 40 mg by mouth daily.         . fluticasone (FLONASE) 50 MCG/ACT nasal spray   Each Nare   Place 2 sprays into both nostrils daily.         Marland Kitchen  Fluticasone-Salmeterol (ADVAIR DISKUS) 250-50 MCG/DOSE AEPB   Inhalation   Inhale 1 puff into the lungs 2 (two) times daily.         Marland Kitchen gabapentin (NEURONTIN) 800 MG tablet   Oral   Take 800 mg by mouth 3 (three) times daily.         Marland Kitchen glipiZIDE (GLUCOTROL) 10 MG tablet   Oral   Take 10 mg by mouth 2 (two) times daily.         Marland Kitchen guaiFENesin (MUCINEX) 600 MG 12 hr tablet   Oral   Take 600 mg by mouth daily.         Marland Kitchen HYDROcodone-acetaminophen (NORCO/VICODIN) 5-325 MG per tablet   Oral   Take 1 tablet by mouth daily as needed.         . hydrocortisone 2.5 % ointment   Topical   Apply 1 application topically daily as needed.         . insulin glargine (LANTUS) 100 UNIT/ML injection   Subcutaneous   Inject 42 Units into the skin at bedtime.         Marland Kitchen ipratropium-albuterol (DUONEB) 0.5-2.5 (3) MG/3ML SOLN   Inhalation   Inhale 3 mLs into the lungs daily as needed.         . lidocaine (LIDODERM) 5 %   Transdermal   Place 1 patch onto the skin every 12 (twelve) hours.         . metFORMIN (GLUCOPHAGE) 1000 MG tablet   Oral   Take 2,000 mg by mouth every  morning.         . metoprolol succinate (TOPROL-XL) 25 MG 24 hr tablet   Oral   Take 25 mg by mouth daily.         . nitroGLYCERIN (NITROSTAT) 0.4 MG SL tablet   Sublingual   Place 0.4 mg under the tongue as needed.         . pantoprazole (PROTONIX) 40 MG tablet   Oral   Take 40 mg by mouth daily.         . predniSONE (STERAPRED UNI-PAK 21 TAB) 10 MG (21) TBPK tablet   Oral   Take 10 mg by mouth daily.         . ranitidine (ZANTAC) 150 MG tablet   Oral   Take 150 mg by mouth at bedtime.         Marland Kitchen tiotropium (SPIRIVA HANDIHALER) 18 MCG inhalation capsule   Inhalation   Place 1 puff into inhaler and inhale daily.         . traMADol (ULTRAM) 50 MG tablet   Oral   Take 50 mg by mouth daily.         . vitamin B-12 (CYANOCOBALAMIN) 1000 MCG tablet   Oral   Take 1,000 mcg by mouth daily.           Allergies Review of patient's allergies indicates no known allergies.  No family history on file.  Social History Social History  Substance Use Topics  . Smoking status: Current Some Day Smoker -- 0.50 packs/day    Types: Cigarettes  . Smokeless tobacco: Never Used  . Alcohol Use: No    Review of Systems Constitutional: No fever/chills Eyes: No visual changes. ENT: No sore throat. Cardiovascular: + chest pain. Respiratory: + shortness of breath. Gastrointestinal: No abdominal pain.  No nausea, no vomiting.  No diarrhea.  No constipation. Genitourinary: Negative for dysuria. Musculoskeletal: Negative for back pain. Skin: Negative for  rash. Neurological: Negative for headaches, focal weakness or numbness.  10-point ROS otherwise negative.  ____________________________________________   PHYSICAL EXAM:  VITAL SIGNS: ED Triage Vitals  Enc Vitals Group     BP 02/05/15 1224 138/93 mmHg     Pulse Rate 02/05/15 1224 94     Resp 02/05/15 1224 18     Temp 02/05/15 1224 98.2 F (36.8 C)     Temp Source 02/05/15 1224 Oral     SpO2 02/05/15 1224 97  %     Weight 02/05/15 1224 200 lb (90.719 kg)     Height 02/05/15 1224 5\' 8"  (1.727 m)     Head Cir --      Peak Flow --      Pain Score 02/05/15 1226 7     Pain Loc --      Pain Edu? --      Excl. in [place]? --     Constitutional: Alert and oriented. Well appearing and in no acute distress. Eyes: Conjunctivae are normal. PERRL. EOMI. Head: Atraumatic. Nose: No congestion/rhinnorhea. Mouth/Throat: Mucous membranes are moist.  Oropharynx non-erythematous. Neck: No stridor.  Cardiovascular: Normal rate, regular rhythm. Grossly normal heart sounds.  Good peripheral circulation. Respiratory: Normal respiratory effort.  No retractions. Faint expiratory wheeze bilaterally, good air movement throughout. Gastrointestinal: Soft and nontender. No distention. No abdominal bruits. No CVA tenderness. Genitourinary: Deferred Musculoskeletal: No lower extremity tenderness nor edema.  No joint effusions. Neurologic:  Normal speech and language. No gross focal neurologic deficits are appreciated. No gait instability. Skin:  Skin is warm, dry and intact. No rash noted. Psychiatric: Mood and affect are normal. Speech and behavior are normal.  ____________________________________________   LABS (all labs ordered are listed, but only abnormal results are displayed)  Labs Reviewed  CBC WITH DIFFERENTIAL/PLATELET - Abnormal; Notable for the following:    RDW 15.1 (*)    All other components within normal limits  COMPREHENSIVE METABOLIC PANEL - Abnormal; Notable for the following:    Glucose, Bld 350 (*)    Albumin 3.4 (*)    ALT <5 (*)    All other components within normal limits  APTT - Abnormal; Notable for the following:    aPTT 38 (*)    All other components within normal limits  TROPONIN I  BRAIN NATRIURETIC PEPTIDE  PROTIME-INR   ____________________________________________  EKG  ED ECG REPORT I, Joanne Gavel, the attending physician, personally viewed and interpreted this ECG.    Date: 02/05/2015  EKG Time: 12:23  Rate: 96  Rhythm:  sinus rhythm with first-degree AV block  Axis: Normal axis  Intervals: First-degree AV block  ST&T Change: No acute ST elevation. T-wave flattening in leads 3, aVF  ____________________________________________  RADIOLOGY  CXR  FINDINGS: Heart size upper normal and unchanged. Vascular pattern normal. Lungs clear. No pleural effusion.  IMPRESSION: No active cardiopulmonary disease. ____________________________________________   PROCEDURES  Procedure(s) performed: None  Critical Care performed: No  ____________________________________________   INITIAL IMPRESSION / ASSESSMENT AND PLAN / ED COURSE  Pertinent labs & imaging results that were available during my care of the patient were reviewed by me and considered in my medical decision making (see chart for details).  Jimmy Hill is a 74 y.o. male with history of coronary artery disease status post multiple stents, the most recent of which DES to the RCA in May 2015, diabetes, GERD, glaucoma, parkinsonism who presents for evaluation of sharp left-sided chest pain. On exam, he is nontoxic appearing and  in no acute distress. Vital signs are stable. He is afebrile. His exam is benign with the exception of a faint expiratory wheeze but he has excellent air movement, no increased work of breathing, he is not hypoxic or tachypnea. EKG with nonspecific T-wave abnormality in the inferior leads. Concern for ACS given  extensive cardiac history with recent stenting last year. Aspirin ordered. First troponin negative. Pain resolved with morphine and supplemental nitroglycerin at this time. He appears much more comfortable. Doubt acute aortic dissection or PE. Case discussed with hospitalist at this time for admission. ____________________________________________   FINAL CLINICAL IMPRESSION(S) / ED DIAGNOSES  Final diagnoses:  Chest pain, unspecified chest pain type       Joanne Gavel, MD 02/05/15 1459

## 2015-02-05 NOTE — H&P (Addendum)
[name] [place] NAME: Jimmy Hill    MR#:  [id number]  DATE OF BIRTH:  1940-09-29  DATE OF ADMISSION:  02/05/2015  PRIMARY CARE PHYSICIAN: Gareth Morgan, MD   REQUESTING/REFERRING PHYSICIAN:   CHIEF COMPLAINT:   Chief Complaint  Patient presents with  . Pleurisy    HISTORY OF PRESENT ILLNESS: Jimmy Hill  is a 74 y.o. male with a known history of diabetes, hyperlipidemia, coronary artery disease status post stent to RCA at [facility] about a year ago who presents to the hospital with complaints of midsternal chest pains. According to the patient, he was doing well up until today morning when he started having midsternal chest pain radiating to left arm. It happened when he woke up in the morning and this pain was as bad as 7 out of 10 by intensity, accompanied by shortness of breath and tightness in the chest.  Pain was described as sharp, intermittent, lasting 7 hours. On arrival to emergency room, his troponin is negative. EKG was unremarkable with nonspecific T-wave abnormality. Hospitalist services where contacted for admission.   PAST MEDICAL HISTORY:   Past Medical History  Diagnosis Date  . Myocardial infarction   . Coronary artery disease   . Anginal pain     PAST SURGICAL HISTORY: History reviewed. No pertinent past surgical history.  SOCIAL HISTORY:  Social History  Substance Use Topics  . Smoking status: Current Some Day Smoker -- 0.50 packs/day    Types: Cigarettes  . Smokeless tobacco: Never Used  . Alcohol Use: No    FAMILY HISTORY: No family history on file.  DRUG ALLERGIES: No Known Allergies  Review of Systems  Constitutional: Positive for malaise/fatigue. Negative for fever, chills and weight loss.  HENT: Negative for congestion.   Eyes: Negative for blurred vision and double vision.  Respiratory: Positive for cough, sputum production, shortness of breath and wheezing.   Cardiovascular: Positive  for chest pain. Negative for palpitations, orthopnea, leg swelling and PND.  Gastrointestinal: Negative for nausea, vomiting, abdominal pain, diarrhea, constipation, blood in stool and melena.  Genitourinary: Negative for dysuria, urgency, frequency and hematuria.  Musculoskeletal: Negative for falls.  Skin: Negative for rash.  Neurological: Negative for dizziness and weakness.  Psychiatric/Behavioral: Negative for depression and memory loss. The patient is not nervous/anxious.     MEDICATIONS AT HOME:  Prior to Admission medications   Medication Sig Start Date End Date Taking? Authorizing Provider  acetaminophen (TYLENOL) 325 MG tablet Take 325 mg by mouth daily as needed. 09/03/11   Historical Provider, MD  albuterol (PROVENTIL) (2.5 MG/3ML) 0.083% nebulizer solution Inhale 2.5 mg into the lungs every 4 (four) hours.    Historical Provider, MD  aspirin 81 MG tablet Take 81 mg by mouth daily. 09/03/11   Historical Provider, MD  atorvastatin (LIPITOR) 80 MG tablet Take 80 mg by mouth daily.    Historical Provider, MD  brimonidine-timolol (COMBIGAN) 0.2-0.5 % ophthalmic solution Place 1 drop into both eyes 2 (two) times daily.    Historical Provider, MD  buPROPion (BUDEPRION XL) 300 MG 24 hr tablet Take 300 mg by mouth daily.    Historical Provider, MD  Calcium Carb-Cholecalciferol (CALCIUM-VITAMIN D3) 600-400 MG-UNIT CAPS Take 1 capsule by mouth daily.    Historical Provider, MD  carbidopa-levodopa (SINEMET IR) 25-100 MG per tablet Take 1 tablet by mouth 4 (four) times daily.    Historical Provider, MD  Carboxymethylcellul-Glycerin (OPTIVE) 0.5-0.9 % SOLN Place 2 drops  into both eyes 3 (three) times daily.    Historical Provider, MD  cetirizine (ZYRTEC) 10 MG tablet Take 10 mg by mouth daily.    Historical Provider, MD  clopidogrel (PLAVIX) 75 MG tablet Take 75 mg by mouth daily.    Historical Provider, MD  dextrose ([employer] GLUCOSE) 40 % GEL Take 15 g by mouth daily as needed.    Historical  Provider, MD  esomeprazole (NEXIUM) 40 MG capsule Take 40 mg by mouth daily. 06/13/10   Historical Provider, MD  fluticasone (FLONASE) 50 MCG/ACT nasal spray Place 2 sprays into both nostrils daily. 09/03/11   Historical Provider, MD  Fluticasone-Salmeterol (ADVAIR DISKUS) 250-50 MCG/DOSE AEPB Inhale 1 puff into the lungs 2 (two) times daily. 02/05/12   Historical Provider, MD  gabapentin (NEURONTIN) 800 MG tablet Take 800 mg by mouth 3 (three) times daily.    Historical Provider, MD  glipiZIDE (GLUCOTROL) 10 MG tablet Take 10 mg by mouth 2 (two) times daily.    Historical Provider, MD  guaiFENesin (MUCINEX) 600 MG 12 hr tablet Take 600 mg by mouth daily. 02/05/12   Historical Provider, MD  HYDROcodone-acetaminophen (NORCO/VICODIN) 5-325 MG per tablet Take 1 tablet by mouth daily as needed.    Historical Provider, MD  hydrocortisone 2.5 % ointment Apply 1 application topically daily as needed.    Historical Provider, MD  insulin glargine (LANTUS) 100 UNIT/ML injection Inject 42 Units into the skin at bedtime.    Historical Provider, MD  ipratropium-albuterol (DUONEB) 0.5-2.5 (3) MG/3ML SOLN Inhale 3 mLs into the lungs daily as needed. 02/05/12   Historical Provider, MD  lidocaine (LIDODERM) 5 % Place 1 patch onto the skin every 12 (twelve) hours.    Historical Provider, MD  metFORMIN (GLUCOPHAGE) 1000 MG tablet Take 2,000 mg by mouth every morning.    Historical Provider, MD  metoprolol succinate (TOPROL-XL) 25 MG 24 hr tablet Take 25 mg by mouth daily. 10/15/13   Historical Provider, MD  nitroGLYCERIN (NITROSTAT) 0.4 MG SL tablet Place 0.4 mg under the tongue as needed. 07/23/09   Historical Provider, MD  pantoprazole (PROTONIX) 40 MG tablet Take 40 mg by mouth daily.    Historical Provider, MD  predniSONE (STERAPRED UNI-PAK 21 TAB) 10 MG (21) TBPK tablet Take 10 mg by mouth daily.    Historical Provider, MD  ranitidine (ZANTAC) 150 MG tablet Take 150 mg by mouth at bedtime.    Historical Provider, MD   tiotropium (SPIRIVA HANDIHALER) 18 MCG inhalation capsule Place 1 puff into inhaler and inhale daily. 09/03/11   Historical Provider, MD  traMADol (ULTRAM) 50 MG tablet Take 50 mg by mouth daily. 02/14/12   Historical Provider, MD  vitamin B-12 (CYANOCOBALAMIN) 1000 MCG tablet Take 1,000 mcg by mouth daily. 09/12/09   Historical Provider, MD      PHYSICAL EXAMINATION:   VITAL SIGNS: Blood pressure 138/93, pulse 94, temperature 98.2 F (36.8 C), temperature source Oral, resp. rate 18, height 5\' 8"  (1.727 m), weight 90.719 kg (200 lb), SpO2 97 %.  GENERAL:  74 y.o.-year-old patient lying in the bed with no acute distress.  EYES: Pupils equal, round, reactive to light and accommodation. No scleral icterus. Extraocular muscles intact.  HEENT: Head atraumatic, normocephalic. Oropharynx and nasopharynx clear.  NECK:  Supple, no jugular venous distention. No thyroid enlargement, no tenderness.  LUNGS: Diminished breath sounds bilaterally, bilateral inspiratory as well as expiratory wheezing, some rhonchi , no crepitation. Using  accessory muscles of respiration on exertion.  CARDIOVASCULAR: S1, S2  normal. No murmurs, rubs, or gallops.  ABDOMEN: Soft, nontender, nondistended. Bowel sounds present. No organomegaly or mass.  EXTREMITIES: No pedal edema, cyanosis, or clubbing.  NEUROLOGIC: Cranial nerves II through XII are intact. Muscle strength 5/5 in all extremities. Sensation intact. Gait not checked.  PSYCHIATRIC: The patient is alert and oriented x 3.  SKIN: No obvious rash, lesion, or ulcer. Dorsalis pedis pulses are diminished, poorly palpable  LABORATORY PANEL:   CBC  Recent Labs Lab 02/05/15 1233  WBC 7.8  HGB 14.3  HCT 43.2  PLT 175  MCV 87.1  MCH 28.8  MCHC 33.0  RDW 15.1*  LYMPHSABS 3.0  MONOABS 0.7  EOSABS 0.2  BASOSABS 0.1   ------------------------------------------------------------------------------------------------------------------  Chemistries   Recent  Labs Lab 02/05/15 1233  NA 137  K 4.6  CL 101  CO2 28  GLUCOSE 350*  BUN 20  CREATININE 1.01  CALCIUM 8.9  AST 31  ALT <5*  ALKPHOS 68  BILITOT 0.5   ------------------------------------------------------------------------------------------------------------------  Cardiac Enzymes  Recent Labs Lab 02/05/15 1233  TROPONINI <0.03   ------------------------------------------------------------------------------------------------------------------  RADIOLOGY: Dg Chest 2 View  02/05/2015   CLINICAL DATA:  Acute onset left side chest pain, hx of MI and CAD, smoker  EXAM: CHEST  2 VIEW  COMPARISON:  12/09/2014  FINDINGS: Heart size upper normal and unchanged. Vascular pattern normal. Lungs clear. No pleural effusion.  IMPRESSION: No active cardiopulmonary disease.   Electronically Signed   By: Skipper Cliche M.D.   On: 02/05/2015 13:33    EKG: Orders placed or performed during the hospital encounter of 02/05/15  . EKG 12-Lead  . EKG 12-Lead  . ED EKG  . ED EKG    IMPRESSION AND PLAN:  Active Problems:   Chest pain   COPD exacerbation   Acute bronchitis   Diabetes mellitus type 2, uncontrolled 1. Chest pain. The patient, medical floor. Check cardiac enzymes 3. Myoview stress test as outpatient 2. COPD exacerbation due to acute bronchitis. Initiate patient on steroids, nebulizers, inhalers, follow clinically 3. Acute bronchitis, start patient on levofloxacin. Get sputum culture 4. Diabetes mellitus type 2, poorly controlled. Continue outpatient medications. Sliding scale insulin, getting hemoglobin A1c 5. Tobacco abuse. Discussed this patient for approximately 4 minutes. Nicotine replacement therapy will be initiated  All the records are reviewed and case discussed with ED provider. Management plans discussed with the patient, family and they are in agreement.  CODE STATUS: Full code  TOTAL TIME TAKING CARE OF THIS PATIENT: 55 minutes.    Theodoro Grist M.D on  02/05/2015 at 3:03 PM  Between 7am to 6pm - Pager - [phone] After 6pm go to [email or web address] - password [email or web address] [facility]  Office  [phone]  CC: Primary care physician; Gareth Morgan, MD

## 2015-02-05 NOTE — ED Notes (Signed)
Pt to ED via EMS from home with c/o left sided chest pain, travels to his left elbow that began this am. Pt appears in no distress at this time.

## 2015-02-05 NOTE — ED Notes (Signed)
Patient is resting comfortably. 

## 2015-02-05 NOTE — ED Notes (Signed)
O2 sats on RA 86-89%. Will notify Dr Edd Fabian. O2 at 2L applied per [facility].

## 2015-02-05 NOTE — Progress Notes (Signed)
Communicated with patient about his procedure in the morning(cardiac cath). Patient stated that he does not feel comfortable having procedure here at [facility]. Patient stated that his cardiologist is at [facility]. Nurse explained that NT will be in to prep groin. Patient stated that he would like to communicate with the doctor before signing the consent form. Nurse respect patient wishes.

## 2015-02-05 NOTE — Consult Note (Signed)
Jimmy Hill  CARDIOLOGY CONSULT NOTE  Patient ID: Jimmy Hill MRN: [id number] DOB/AGE: [birth year] 74 y.o.  Admit date: 02/05/2015 Referring Physician [place] Primary Physician [facility] Primary Cardiologist Chong Sicilian, [facility] Reason for Consultation  Chest pain  HPI:  Jimmy Hill is a 74 y.o. gentleman with past medical history of known coronary artery disease, type 2 diabetes, stable angina and early Parkinson's disease. He has history of multiple stent placements at [facility]. He had  A cath in 2/15 which revealed a 106 percent lad stnosis, 50 percent lcx stenosis and restenosis of mid rca stent. He had a promus 3.5 x 20 mm stent in the lad. In 4/15 had poba or mid rca , in  5/15, had a pci of ostial rca with xine 2.75 x 12 mm stent. Now is admitted with complaints of chest pain similar to his angina pattern that he had prior to his stents. He has ruled out for an mi but continues to have chest pain similar to his angina. He reports compliance with his meds.    ROS Review of Systems - History obtained from chart review and the patient General ROS: positive for  - malaise Respiratory ROS: positive for - shortness of breath Cardiovascular ROS: positive for - chest pain Gastrointestinal ROS: no abdominal pain, change in bowel habits, or black or bloody stools Musculoskeletal ROS: negative Neurological ROS: no TIA or stroke symptoms   Past Medical History  Diagnosis Date  . Myocardial infarction   . Coronary artery disease   . Anginal pain     No family history on file.  Social History   Social History  . Marital Status: Married    Spouse Name: N/A  . Number of Children: N/A  . Years of Education: N/A   Occupational History  . Not on file.   Social History Main Topics  . Smoking status: Current Some Day Smoker -- 0.50 packs/day    Types: Cigarettes  . Smokeless tobacco: Never Used  . Alcohol Use: No  . Drug Use: No  . Sexual  Activity: No   Other Topics Concern  . Not on file   Social History Narrative    History reviewed. No pertinent past surgical history.   Prescriptions prior to admission  Medication Sig Dispense Refill Last Dose  . acetaminophen (TYLENOL) 325 MG tablet Take 650 mg by mouth every 6 (six) hours as needed for mild pain or headache.   02/04/2015 at 2300  . albuterol (PROVENTIL) (2.5 MG/3ML) 0.083% nebulizer solution Take 2.5 mg by nebulization every 4 (four) hours as needed for wheezing or shortness of breath.   02/04/2015 at Unknown time  . aspirin EC 81 MG tablet Take 81 mg by mouth daily.   02/05/2015 at 0900  . atorvastatin (LIPITOR) 80 MG tablet Take 80 mg by mouth at bedtime.   02/04/2015 at Unknown time  . brimonidine-timolol (COMBIGAN) 0.2-0.5 % ophthalmic solution Place 1 drop into both eyes 2 (two) times daily.   02/05/2015 at Unknown time  . buPROPion (WELLBUTRIN XL) 300 MG 24 hr tablet Take 300 mg by mouth daily.   02/05/2015 at Unknown time  . Calcium Carbonate-Vitamin D (CALCIUM 600+D) 600-400 MG-UNIT per tablet Take 1 tablet by mouth daily.   02/05/2015 at Unknown time  . carbidopa-levodopa (SINEMET IR) 25-100 MG per tablet Take 1 tablet by mouth 4 (four) times daily.   02/05/2015 at Unknown time  . Carboxymethylcellul-Glycerin (OPTIVE) 0.5-0.9 % SOLN Apply  1-2 drops to eye 3 (three) times daily as needed (for dry eyes).   02/05/2015 at Unknown time  . cetirizine (ZYRTEC) 10 MG tablet Take 10 mg by mouth at bedtime.   02/04/2015 at Unknown time  . clopidogrel (PLAVIX) 75 MG tablet Take 75 mg by mouth daily.   02/05/2015 at 0900  . esomeprazole (NEXIUM) 40 MG capsule Take 40 mg by mouth daily.   02/05/2015 at Unknown time  . fluticasone (FLONASE) 50 MCG/ACT nasal spray Place 2 sprays into both nostrils daily as needed for rhinitis.   02/04/2015 at Unknown time  . Fluticasone-Salmeterol (ADVAIR) 250-50 MCG/DOSE AEPB Inhale 1 puff into the lungs 2 (two) times daily.   02/05/2015 at Unknown time  .  gabapentin (NEURONTIN) 800 MG tablet Take 800 mg by mouth 3 (three) times daily.   02/05/2015 at Unknown time  . glipiZIDE (GLUCOTROL) 10 MG tablet Take 10 mg by mouth 2 (two) times daily.   02/05/2015 at Unknown time  . guaiFENesin (MUCINEX) 600 MG 12 hr tablet Take 600 mg by mouth daily.   02/05/2015 at 0900  . HYDROcodone-acetaminophen (NORCO/VICODIN) 5-325 MG per tablet Take 1 tablet by mouth every 6 (six) hours as needed for moderate pain.   02/05/2015 at 0900  . insulin glargine (LANTUS) 100 UNIT/ML injection Inject 42 Units into the skin at bedtime.   02/04/2015 at Unknown time  . lidocaine (LIDODERM) 5 % Place 1 patch onto the skin daily. Remove & Discard patch within 12 hours or as directed by MD   unknown at unknown  . metFORMIN (GLUCOPHAGE) 1000 MG tablet Take 2,000 mg by mouth daily.   02/05/2015 at Unknown time  . metoprolol succinate (TOPROL-XL) 25 MG 24 hr tablet Take 25 mg by mouth daily.   02/05/2015 at 0900  . nitroGLYCERIN (NITROSTAT) 0.4 MG SL tablet Place 0.4 mg under the tongue every 5 (five) minutes as needed for chest pain.   PRN at PRN  . pantoprazole (PROTONIX) 40 MG tablet Take 40 mg by mouth daily.   02/05/2015 at Unknown time  . ranitidine (ZANTAC) 150 MG tablet Take 150 mg by mouth at bedtime.   02/04/2015 at Unknown time  . tiotropium (SPIRIVA) 18 MCG inhalation capsule Place 18 mcg into inhaler and inhale daily.   02/05/2015 at Unknown time  . traMADol (ULTRAM) 50 MG tablet Take 50 mg by mouth every 6 (six) hours as needed for moderate pain.   02/05/2015 at 0900  . vitamin B-12 (CYANOCOBALAMIN) 1000 MCG tablet Take 1,000 mcg by mouth daily.   02/05/2015 at Unknown time    Physical Exam: Blood pressure 118/70, pulse 88, temperature 98.3 F (36.8 C), temperature source Oral, resp. rate 20, height 5\' 8"  (1.727 m), weight 90.719 kg (200 lb), SpO2 93 %.    General appearance: cooperative Head: Normocephalic, without obvious abnormality, atraumatic Resp: clear to auscultation  bilaterally Chest wall: no tenderness Cardio: regular rate and rhythm, S1, S2 normal, no murmur, click, rub or gallop GI: soft, non-tender; bowel sounds normal; no masses,  no organomegaly Extremities: extremities normal, atraumatic, no cyanosis or edema Pulses: 2+ and symmetric Neurologic: Grossly normal Labs:   Lab Results  Component Value Date   WBC 7.8 02/05/2015   HGB 14.3 02/05/2015   HCT 43.2 02/05/2015   MCV 87.1 02/05/2015   PLT 175 02/05/2015    Recent Labs Lab 02/05/15 1233  NA 137  K 4.6  CL 101  CO2 28  BUN 20  CREATININE 1.01  CALCIUM 8.9  PROT 7.1  BILITOT 0.5  ALKPHOS 68  ALT <5*  AST 31  GLUCOSE 350*   Lab Results  Component Value Date   TROPONINI <0.03 02/05/2015      Radiology: no acute process EKG: nsr with no ischemia ASSESSMENT AND PLAN:  Pt with history of multiple stents with restenosis on several occasions with admission with chest pain with rest similar to his angina. Ruled out for mi but symtpoms similar to his angina. Currently treated with duap and hih intensity statin as well as beta blockers. Will need consideration for cardiac cath with rest pain and typical angina and history of restenosis on several occasions. Pt defers cardiac cath at [facility] and wishes it to be done at [facility]. Will cancel cardiac cath. Pt can be transferred to [facility] for consideraiton for cath.    Signed: Teodoro Spray MD, [facility] 02/05/2015, 9:19 PM

## 2015-02-06 ENCOUNTER — Encounter
Admission: EM | Disposition: A | Discharge status: Other Acute Inpatient Hospital | Payer: Self-pay | Source: Home / Self Care | Attending: Student

## 2015-02-06 LAB — CBC
HEMATOCRIT: 43.4 % (ref 40.0–52.0)
HEMOGLOBIN: 14.2 g/dL (ref 13.0–18.0)
MCH: 28.5 pg (ref 26.0–34.0)
MCHC: 32.7 g/dL (ref 32.0–36.0)
MCV: 87.2 fL (ref 80.0–100.0)
Platelets: 167 10*3/uL (ref 150–440)
RBC: 4.98 MIL/uL (ref 4.40–5.90)
RDW: 15.3 % — ABNORMAL HIGH (ref 11.5–14.5)
WBC: 5.5 10*3/uL (ref 3.8–10.6)

## 2015-02-06 LAB — BASIC METABOLIC PANEL
ANION GAP: 11 (ref 5–15)
BUN: 22 mg/dL — ABNORMAL HIGH (ref 6–20)
CHLORIDE: 101 mmol/L (ref 101–111)
CO2: 23 mmol/L (ref 22–32)
Calcium: 8.8 mg/dL — ABNORMAL LOW (ref 8.9–10.3)
Creatinine, Ser: 0.99 mg/dL (ref 0.61–1.24)
GFR calc Af Amer: >60 mL/min (ref >60)
Glucose, Bld: 329 mg/dL — ABNORMAL HIGH (ref 65–99)
POTASSIUM: 4.7 mmol/L (ref 3.5–5.1)
SODIUM: 135 mmol/L (ref 135–145)

## 2015-02-06 LAB — GLUCOSE, CAPILLARY: Glucose-Capillary: 348 mg/dL — ABNORMAL HIGH (ref 65–99)

## 2015-02-06 LAB — TROPONIN I

## 2015-02-06 LAB — HEMOGLOBIN A1C: Hgb A1c MFr Bld: 10.7 % — ABNORMAL HIGH (ref 4.0–6.0)

## 2015-02-06 SURGERY — LEFT HEART CATH AND CORONARY ANGIOGRAPHY
Anesthesia: Moderate Sedation

## 2015-02-06 MED ORDER — METHYLPREDNISOLONE SODIUM SUCC 125 MG IJ SOLR: 60.0000 mg | Freq: Four times a day (QID) | INTRAMUSCULAR | Status: DC

## 2015-02-06 MED ORDER — LEVOFLOXACIN IN D5W 500 MG/100ML IV SOLN: 500.0000 mg | INTRAVENOUS | Status: DC

## 2015-02-06 MED ORDER — INSULIN ASPART 100 UNIT/ML ~~LOC~~ SOLN: 0.0000 [IU] | Freq: Every day | SUBCUTANEOUS | Status: DC

## 2015-02-06 MED ORDER — INSULIN ASPART 100 UNIT/ML ~~LOC~~ SOLN: 0.0000 [IU] | Freq: Three times a day (TID) | SUBCUTANEOUS | Status: DC

## 2015-02-06 NOTE — Care Management (Signed)
Contacted by Burman Freestone at [facility] and informed that patient is to be transferred to [facility] for continuity of care.  Patient's cardiologist practices at [facility] and it is thought that patient's chest pain is cardiac in nature.

## 2015-02-06 NOTE — Discharge Summary (Signed)
[facility] [place] NAME: Jimmy Hill    MR#:  [id number]  DATE OF BIRTH:  April 07, 1941  DATE OF ADMISSION:  02/05/2015 ADMITTING PHYSICIAN: Theodoro Grist, MD  DATE OF DISCHARGE: 02/06/2015  PRIMARY CARE PHYSICIAN: Gareth Morgan, MD    ADMISSION DIAGNOSIS:  Chest pain, unspecified chest pain type [R07.9]  DISCHARGE DIAGNOSIS:  Active Problems:   Chest pain   COPD exacerbation   Acute bronchitis   Diabetes mellitus type 2, uncontrolled   SECONDARY DIAGNOSIS:   Past Medical History  Diagnosis Date  . Myocardial infarction   . Coronary artery disease   . Anginal pain     HOSPITAL COURSE:   1. Chest pain, unstable angina with chest pain at rest. Cardiology recommended a cardiac catheter. The patient refused to have a cardiac catheterization here at this hospital. He wanted to go to [facility] where his physicians are. He was accepted at [facility] for transfer. Patient on Toprol aspirin and Plavix. 2. COPD exacerbation- Solu-Medrol and Levaquin and nebulizer treatments prescribed. 3. Type 2 diabetes with hyperglycemia- sugars will be elevated while on steroids. Hold metformin for cardiac catheter. 4. Essential hypertension- continue usual medications. 5. Hyperlipidemia unspecified- on high-dose atorvastatin 6. Neuropathy continue gabapentin 7. Glaucoma unspecified continue eyedrops. 8. Gastroesophageal reflux disease without esophagitis- patient on numerous medications for this. Follow-up [facility] 1 day.  DISCHARGE CONDITIONS:   Stable for transfer  CONSULTS OBTAINED:  Treatment Team:  Teodoro Spray, MD  DRUG ALLERGIES:  No Known Allergies  DISCHARGE MEDICATIONS:   Current Discharge Medication List    START taking these medications   Details  !! insulin aspart (NOVOLOG) 100 UNIT/ML injection Inject 0-15 Units into the skin 3 (three) times daily with meals. Qty: 10 mL, Refills: 11    !! insulin aspart (NOVOLOG) 100  UNIT/ML injection Inject 0-5 Units into the skin at bedtime. Qty: 10 mL, Refills: 11    levofloxacin (LEVAQUIN) 500 MG/100ML SOLN Inject 100 mLs (500 mg total) into the vein daily. Qty: 1000 mL    methylPREDNISolone sodium succinate (SOLU-MEDROL) 125 mg/2 mL injection Inject 0.96 mLs (60 mg total) into the vein every 6 (six) hours. Qty: 1 each, Refills: 0     !! - Potential duplicate medications found. Please discuss with provider.    CONTINUE these medications which have NOT CHANGED   Details  acetaminophen (TYLENOL) 325 MG tablet Take 650 mg by mouth every 6 (six) hours as needed for mild pain or headache.    albuterol (PROVENTIL) (2.5 MG/3ML) 0.083% nebulizer solution Take 2.5 mg by nebulization every 4 (four) hours as needed for wheezing or shortness of breath.    aspirin EC 81 MG tablet Take 81 mg by mouth daily.    atorvastatin (LIPITOR) 80 MG tablet Take 80 mg by mouth at bedtime.    brimonidine-timolol (COMBIGAN) 0.2-0.5 % ophthalmic solution Place 1 drop into both eyes 2 (two) times daily.    buPROPion (WELLBUTRIN XL) 300 MG 24 hr tablet Take 300 mg by mouth daily.    Calcium Carbonate-Vitamin D (CALCIUM 600+D) 600-400 MG-UNIT per tablet Take 1 tablet by mouth daily.    carbidopa-levodopa (SINEMET IR) 25-100 MG per tablet Take 1 tablet by mouth 4 (four) times daily.    Carboxymethylcellul-Glycerin (OPTIVE) 0.5-0.9 % SOLN Apply 1-2 drops to eye 3 (three) times daily as needed (for dry eyes).    cetirizine (ZYRTEC) 10 MG tablet Take 10 mg by mouth at bedtime.  clopidogrel (PLAVIX) 75 MG tablet Take 75 mg by mouth daily.    esomeprazole (NEXIUM) 40 MG capsule Take 40 mg by mouth daily.    fluticasone (FLONASE) 50 MCG/ACT nasal spray Place 2 sprays into both nostrils daily as needed for rhinitis.    Fluticasone-Salmeterol (ADVAIR) 250-50 MCG/DOSE AEPB Inhale 1 puff into the lungs 2 (two) times daily.    gabapentin (NEURONTIN) 800 MG tablet Take 800 mg by mouth 3  (three) times daily.    glipiZIDE (GLUCOTROL) 10 MG tablet Take 10 mg by mouth 2 (two) times daily.    guaiFENesin (MUCINEX) 600 MG 12 hr tablet Take 600 mg by mouth daily.    HYDROcodone-acetaminophen (NORCO/VICODIN) 5-325 MG per tablet Take 1 tablet by mouth every 6 (six) hours as needed for moderate pain.    insulin glargine (LANTUS) 100 UNIT/ML injection Inject 42 Units into the skin at bedtime.    lidocaine (LIDODERM) 5 % Place 1 patch onto the skin daily. Remove & Discard patch within 12 hours or as directed by MD    metoprolol succinate (TOPROL-XL) 25 MG 24 hr tablet Take 25 mg by mouth daily.    nitroGLYCERIN (NITROSTAT) 0.4 MG SL tablet Place 0.4 mg under the tongue every 5 (five) minutes as needed for chest pain.    pantoprazole (PROTONIX) 40 MG tablet Take 40 mg by mouth daily.    ranitidine (ZANTAC) 150 MG tablet Take 150 mg by mouth at bedtime.    tiotropium (SPIRIVA) 18 MCG inhalation capsule Place 18 mcg into inhaler and inhale daily.    traMADol (ULTRAM) 50 MG tablet Take 50 mg by mouth every 6 (six) hours as needed for moderate pain.    vitamin B-12 (CYANOCOBALAMIN) 1000 MCG tablet Take 1,000 mcg by mouth daily.      STOP taking these medications     metFORMIN (GLUCOPHAGE) 1000 MG tablet      metFORMIN (GLUCOPHAGE) 1000 MG tablet          DISCHARGE INSTRUCTIONS:   Follow-up at [facility] 1 day.  If you experience worsening of your admission symptoms, develop shortness of breath, life threatening emergency, suicidal or homicidal thoughts you must seek medical attention immediately by calling 911 or calling your MD immediately  if symptoms less severe.  You Must read complete instructions/literature along with all the possible adverse reactions/side effects for all the Medicines you take and that have been prescribed to you. Take any new Medicines after you have completely understood and accept all the possible adverse reactions/side effects.   Please note  You  were cared for by a hospitalist during your hospital stay. If you have any questions about your discharge medications or the care you received while you were in the hospital after you are discharged, you can call the unit and asked to speak with the hospitalist on call if the hospitalist that took care of you is not available. Once you are discharged, your primary care physician will handle any further medical issues. Please note that NO REFILLS for any discharge medications will be authorized once you are discharged, as it is imperative that you return to your primary care physician (or establish a relationship with a primary care physician if you do not have one) for your aftercare needs so that they can reassess your need for medications and monitor your lab values.    Today   CHIEF COMPLAINT:   Chief Complaint  Patient presents with  . Pleurisy    HISTORY OF PRESENT ILLNESS:  Jimmy Hill  is a 74 y.o. male with a known history of coronary artery disease. Presented with chest pain and shortness of breath.   VITAL SIGNS:  Blood pressure 126/76, pulse 83, temperature 98.4 F (36.9 C), temperature source Oral, resp. rate 18, height 5\' 8"  (1.727 m), weight 90.901 kg (200 lb 6.4 oz), SpO2 96 %.  I/O:   Intake/Output Summary (Last 24 hours) at 02/06/15 0947 Last data filed at 02/06/15 0800  Gross per 24 hour  Intake    343 ml  Output   1400 ml  Net  -1057 ml    PHYSICAL EXAMINATION:  GENERAL:  74 y.o.-year-old patient lying in the bed with no acute distress.  EYES: Pupils equal, round, reactive to light and accommodation. No scleral icterus. Extraocular muscles intact.  HEENT: Head atraumatic, normocephalic. Oropharynx and nasopharynx clear.  NECK:  Supple, no jugular venous distention. No thyroid enlargement, no tenderness.  LUNGS:   decreased breath sounds bilaterally,  w bilateral heezing,  no rales,rhonchi or crepitation. No use of accessory muscles of respiration.   CARDIOVASCULAR: S1, S2 normal. 2/6 systolic murmurs,  no rubs, or gallops.  ABDOMEN: Soft, non-tender, non-distended. Bowel sounds present. No organomegaly or mass.  EXTREMITIES: trace  edema, no cyanosis, or clubbing.  NEUROLOGIC: Cranial nerves II through XII are intact. Muscle strength 5/5 in all extremities. Sensation intact. Gait not checked.  PSYCHIATRIC: The patient is alert and oriented x 3.  SKIN: No obvious rash, lesion, or ulcer.   DATA REVIEW:   CBC  Recent Labs Lab 02/06/15 0353  WBC 5.5  HGB 14.2  HCT 43.4  PLT 167    Chemistries   Recent Labs Lab 02/05/15 1233 02/06/15 0353  NA 137 135  K 4.6 4.7  CL 101 101  CO2 28 23  GLUCOSE 350* 329*  BUN 20 22*  CREATININE 1.01 0.99  CALCIUM 8.9 8.8*  AST 31  --   ALT <5*  --   ALKPHOS 68  --   BILITOT 0.5  --     Cardiac Enzymes  Recent Labs Lab 02/06/15 0353  TROPONINI <0.03    Microbiology Results  Results for orders placed or performed in visit on 12/07/13  Culture, blood (single)     Status: None   Collection Time: 12/07/13 10:18 PM  Result Value Ref Range Status   Micro Text Report   Final       COMMENT                   NO GROWTH AEROBICALLY/ANAEROBICALLY IN 5 DAYS   ANTIBIOTIC                                                      Culture, blood (single)     Status: None   Collection Time: 12/07/13 10:18 PM  Result Value Ref Range Status   Micro Text Report   Final       COMMENT                   NO GROWTH AEROBICALLY/ANAEROBICALLY IN 5 DAYS   ANTIBIOTIC  RADIOLOGY:  Dg Chest 2 View  02/05/2015   CLINICAL DATA:  Acute onset left side chest pain, hx of MI and CAD, smoker  EXAM: CHEST  2 VIEW  COMPARISON:  12/09/2014  FINDINGS: Heart size upper normal and unchanged. Vascular pattern normal. Lungs clear. No pleural effusion.  IMPRESSION: No active cardiopulmonary disease.   Electronically Signed   By: Skipper Cliche M.D.   On:  02/05/2015 13:33   Management plans discussed with the patient, family and they are in agreement.  CODE STATUS:     Code Status Orders        Start     Ordered   02/05/15 1615  Full code   Continuous     02/05/15 1614    Advance Directive Documentation        Most Recent Value   Type of Advance Directive  Healthcare Power of Attorney, Living will   Pre-existing out of facility DNR order (yellow form or pink MOST form)     "MOST" Form in Place?        TOTAL TIME TAKING CARE OF THIS PATIENT: 35  minutes.    Loletha Grayer M.D on 02/06/2015 at 9:47 AM  Between 7am to 6pm - Pager - [phone]  After 6pm go to [email or web address] - password [email or web address] [facility]  Office  [phone]  CC: Primary care physician; Gareth Morgan, MD

## 2015-02-06 NOTE — Progress Notes (Signed)
Inpatient Diabetes Program Recommendations  AACE/ADA: New Consensus Statement on Inpatient Glycemic Control (2015)  Target Ranges:  Prepandial:   less than 140 mg/dL      Peak postprandial:   less than 180 mg/dL (1-2 hours)      Critically ill patients:  140 - 180 mg/dL   Results for Jimmy Hill, Jimmy Hill (MRN [id number]) as of 02/06/2015 08:02  Ref. Range 12/10/2014 00:48 12/10/2014 07:41 02/05/2015 17:10 02/05/2015 20:34 02/06/2015 07:25  Glucose-Capillary Latest Ref Range: 65-99 mg/dL 301 (H) 165 (H) 289 (H) 249 (H) 348 (H)    Review of Glycemic Control  Diabetes history: Type 2, A1C pending Outpatient Diabetes medications: Glipizide 10mg  bid, Lantus 42 units qhs, Metformin 2000mg  /day Current orders for Inpatient glycemic control: Metformin 2000mg /day, Glipizide 10mg  bid, Lantus 45 units qhs, Novolog 0-5 units qhs, Novolog 0-15 units tid, Novolog 4 units tid  Inpatient Diabetes Program Recommendations:  Please consider increasing the Novolog correction to the resistant scale (0-20 units) tid since patient is on steroids.   If the patient remains at [facility] for cardiac cath, may consider putting him on an insulin drip (IV insulin / Glucostabilizer   Gentry Fitz, RN, [place], [place], CDE Diabetes Coordinator Inpatient Diabetes Program  [phone] (Team Pager) [phone] ([place]) 02/06/2015 8:10 AM

## 2015-02-06 NOTE — Progress Notes (Signed)
Pt discharger via EMS to [facility] for cardiac lab.  Osgood is Dr Sharion Balloon.  Talked with Nuri to give report of the pt.  Pt is in no pain or distress.  Pt was given all morning meds.  Pt on the phone with wife and daughter to tell them of the transportation to [facility].

## 2015-05-14 DIAGNOSIS — I200 Unstable angina: Secondary | ICD-10-CM | POA: Insufficient documentation

## 2015-05-29 ENCOUNTER — Emergency Department
Admission: EM | Admit: 2015-05-29 | Discharge: 2015-05-29 | Disposition: A | Discharge status: Home / Self Care | Payer: Medicare (Managed Care) | Attending: Emergency Medicine | Admitting: Emergency Medicine

## 2015-05-29 ENCOUNTER — Encounter: Payer: Self-pay | Admitting: Emergency Medicine

## 2015-05-29 DIAGNOSIS — E119 Type 2 diabetes mellitus without complications: Secondary | ICD-10-CM | POA: Diagnosis not present

## 2015-05-29 DIAGNOSIS — R197 Diarrhea, unspecified: Secondary | ICD-10-CM | POA: Diagnosis not present

## 2015-05-29 DIAGNOSIS — F17210 Nicotine dependence, cigarettes, uncomplicated: Secondary | ICD-10-CM | POA: Diagnosis not present

## 2015-05-29 HISTORY — DX: Parkinson's disease without dyskinesia, without mention of fluctuations: G20.A1

## 2015-05-29 HISTORY — DX: Parkinson's disease: G20

## 2015-05-29 HISTORY — DX: Type 2 diabetes mellitus without complications: E11.9

## 2015-05-29 LAB — COMPREHENSIVE METABOLIC PANEL
ALK PHOS: 63 U/L (ref 38–126)
ALT: 10 U/L — AB (ref 17–63)
AST: 18 U/L (ref 15–41)
Albumin: 3.3 g/dL — ABNORMAL LOW (ref 3.5–5.0)
Anion gap: 8 (ref 5–15)
BILIRUBIN TOTAL: 1.1 mg/dL (ref 0.3–1.2)
BUN: 16 mg/dL (ref 6–20)
CALCIUM: 8.6 mg/dL — AB (ref 8.9–10.3)
CHLORIDE: 105 mmol/L (ref 101–111)
CO2: 26 mmol/L (ref 22–32)
CREATININE: 1.1 mg/dL (ref 0.61–1.24)
Glucose, Bld: 271 mg/dL — ABNORMAL HIGH (ref 65–99)
Potassium: 4 mmol/L (ref 3.5–5.1)
Sodium: 139 mmol/L (ref 135–145)
TOTAL PROTEIN: 6 g/dL — AB (ref 6.5–8.1)

## 2015-05-29 LAB — CBC WITH DIFFERENTIAL/PLATELET
BASOS ABS: 0.1 10*3/uL (ref 0–0.1)
Basophils Relative: 1 %
EOS PCT: 4 %
Eosinophils Absolute: 0.4 10*3/uL (ref 0–0.7)
HEMATOCRIT: 41.9 % (ref 40.0–52.0)
HEMOGLOBIN: 13.8 g/dL (ref 13.0–18.0)
LYMPHS ABS: 2.7 10*3/uL (ref 1.0–3.6)
LYMPHS PCT: 32 %
MCH: 29.7 pg (ref 26.0–34.0)
MCHC: 33 g/dL (ref 32.0–36.0)
MCV: 90.2 fL (ref 80.0–100.0)
Monocytes Absolute: 0.8 10*3/uL (ref 0.2–1.0)
Monocytes Relative: 10 %
NEUTROS ABS: 4.6 10*3/uL (ref 1.4–6.5)
Neutrophils Relative %: 53 %
Platelets: 196 10*3/uL (ref 150–440)
RBC: 4.65 MIL/uL (ref 4.40–5.90)
RDW: 14.5 % (ref 11.5–14.5)
WBC: 8.6 10*3/uL (ref 3.8–10.6)

## 2015-05-29 LAB — URINALYSIS COMPLETE WITH MICROSCOPIC (ARMC ONLY)
Bacteria, UA: NONE SEEN
Bilirubin Urine: NEGATIVE
GLUCOSE, UA: 150 mg/dL — AB
Hgb urine dipstick: NEGATIVE
NITRITE: NEGATIVE
PROTEIN: NEGATIVE mg/dL
Specific Gravity, Urine: 1.016 (ref 1.005–1.030)
pH: 5 (ref 5.0–8.0)

## 2015-05-29 MED ORDER — LOPERAMIDE HCL 2 MG PO CAPS
4.0000 mg | ORAL_CAPSULE | Freq: Once | ORAL | Status: AC
  Administered 2015-05-29: 4 mg via ORAL
  Filled 2015-05-29: qty 2

## 2015-05-29 MED ORDER — SODIUM CHLORIDE 0.9 % IV SOLN
Freq: Once | INTRAVENOUS | Status: AC
  Administered 2015-05-29: 11:00:00 via INTRAVENOUS
  Filled 2015-05-29: qty 1000

## 2015-05-29 NOTE — ED Provider Notes (Signed)
[facility] Emergency Department Provider Note     Time seen: ----------------------------------------- 10:38 AM on 05/29/2015 -----------------------------------------    I have reviewed the triage vital signs and the nursing notes.   HISTORY  Chief Complaint Diarrhea    HPI Jimmy Hill is a 75 y.o. male who presents to ER for diarrhea for the last 48 hours. Patient's been having liquid stools a proximal 4 times a day for the last 2 days and now feels like his blood pressure is low and he is weak. Patient has had this happen before but never this severe, is concerned may be dehydrated. He denies fevers, chills, chest pain, shortness of breath, nausea vomiting. Patient denies any abdominal pain. Patient was ill several weeks ago and received oral antibiotics for COPD.   Past Medical History  Diagnosis Date  . Myocardial infarction ([place])   . Coronary artery disease   . Anginal pain ([place])   . Parkinson's disease ([place])   . Diabetes mellitus without complication [facility])     Patient Active Problem List   Diagnosis Date Noted  . COPD exacerbation ([place]) 02/05/2015  . Acute bronchitis 02/05/2015  . Diabetes mellitus type 2, uncontrolled ([place]) 02/05/2015  . Chest pain 12/09/2014    History reviewed. No pertinent past surgical history.  Allergies Review of patient's allergies indicates no known allergies.  Social History Social History  Substance Use Topics  . Smoking status: Current Some Day Smoker -- 0.50 packs/day    Types: Cigarettes  . Smokeless tobacco: Never Used  . Alcohol Use: No    Review of Systems Constitutional: Negative for fever. Eyes: Negative for visual changes. ENT: Negative for sore throat. Cardiovascular: Negative for chest pain. Respiratory: Negative for shortness of breath. Gastrointestinal: Negative for abdominal pain, positive for diarrhea Genitourinary: Negative for dysuria. Musculoskeletal: Negative for back  pain. Skin: Negative for rash. Neurological: Negative for headaches, focal weakness or numbness.  10-point ROS otherwise negative.  ____________________________________________   PHYSICAL EXAM:  VITAL SIGNS: ED Triage Vitals  Enc Vitals Group     BP 05/29/15 1020 119/67 mmHg     Pulse Rate 05/29/15 1020 88     Resp 05/29/15 1020 18     Temp 05/29/15 1020 98 F (36.7 C)     Temp Source 05/29/15 1020 Oral     SpO2 05/29/15 1020 100 %     Weight 05/29/15 1020 200 lb (90.719 kg)     Height 05/29/15 1020 5\' 8"  (1.727 m)     Head Cir --      Peak Flow --      Pain Score 05/29/15 1020 9     Pain Loc --      Pain Edu? --      Excl. in [place]? --     Constitutional: Alert and oriented. Well appearing and in no distress. Eyes: Conjunctivae are normal. PERRL. Normal extraocular movements. ENT   Head: Normocephalic and atraumatic.   Nose: No congestion/rhinnorhea.   Mouth/Throat: Mucous membranes are moist.   Neck: No stridor. Cardiovascular: Normal rate, regular rhythm. Normal and symmetric distal pulses are present in all extremities. No murmurs, rubs, or gallops. Respiratory: Normal respiratory effort without tachypnea nor retractions. Breath sounds are clear and equal bilaterally. No wheezes/rales/rhonchi. Gastrointestinal: Soft and nontender. No distention. No abdominal bruits.  Musculoskeletal: Nontender with normal range of motion in all extremities. No joint effusions.  No lower extremity tenderness nor edema. Neurologic:  Normal speech and language. No gross focal neurologic  deficits are appreciated. Speech is normal.  Skin:  Skin is warm, dry and intact. No rash noted. Psychiatric: Mood and affect are normal. Speech and behavior are normal. Patient exhibits appropriate insight and judgment. ____________________________________________  EKG: Interpreted by me. Normal sinus rhythm with a rate of 89 bpm, first degree AV block, normal QRS with, normal QT interval.  Nonspecific ST and T-wave changes.  ____________________________________________  ED COURSE:  Pertinent labs & imaging results that were available during my care of the patient were reviewed by me and considered in my medical decision making (see chart for details). Patient is in no acute distress, likely dehydrated. Will receive IV fluid bolus and basic labs. We will attempt to collect a stool specimen. ____________________________________________    LABS (pertinent positives/negatives)  Labs Reviewed  COMPREHENSIVE METABOLIC PANEL - Abnormal; Notable for the following:    Glucose, Bld 271 (*)    Calcium 8.6 (*)    Total Protein 6.0 (*)    Albumin 3.3 (*)    ALT 10 (*)    All other components within normal limits  GASTROINTESTINAL PANEL BY PCR, STOOL (REPLACES STOOL CULTURE)  C DIFFICILE QUICK SCREEN W PCR REFLEX  CBC WITH DIFFERENTIAL/PLATELET  URINALYSIS COMPLETEWITH MICROSCOPIC ([facility] ONLY)   ____________________________________________  FINAL ASSESSMENT AND PLAN  Diarrhea  Plan: Patient with labs as dictated above. Patient did received saline bolus here. Patient is feeling better after IV fluid bolus, currently he is in no acute distress and has not had any further diarrhea here. He stable for outpatient follow-up with his doctor for recheck.   Earleen Newport, MD   Earleen Newport, MD 05/29/15 [phone]

## 2015-05-29 NOTE — Discharge Instructions (Signed)

## 2015-05-29 NOTE — ED Notes (Signed)
Pt to ed via ems from home with family with c/o diarrhea x 2 days.  Pt states he has been having liquids stools approximately 4 times a day x 2 days and now feels like his blood pressure is low and he feels weak.

## 2015-11-28 ENCOUNTER — Encounter: Payer: Self-pay | Admitting: *Deleted

## 2015-12-03 ENCOUNTER — Emergency Department: Payer: Medicare (Managed Care)

## 2015-12-03 ENCOUNTER — Inpatient Hospital Stay
Admission: EM | Admit: 2015-12-03 | Discharge: 2015-12-06 | DRG: 481 | Disposition: A | Discharge status: Skilled Nursing Facility | Payer: Medicare (Managed Care) | Attending: Specialist | Admitting: Specialist

## 2015-12-03 ENCOUNTER — Encounter: Payer: Self-pay | Admitting: *Deleted

## 2015-12-03 DIAGNOSIS — E1122 Type 2 diabetes mellitus with diabetic chronic kidney disease: Secondary | ICD-10-CM | POA: Diagnosis present

## 2015-12-03 DIAGNOSIS — S72012A Unspecified intracapsular fracture of left femur, initial encounter for closed fracture: Principal | ICD-10-CM | POA: Diagnosis present

## 2015-12-03 DIAGNOSIS — S72002A Fracture of unspecified part of neck of left femur, initial encounter for closed fracture: Secondary | ICD-10-CM | POA: Diagnosis present

## 2015-12-03 DIAGNOSIS — Z955 Presence of coronary angioplasty implant and graft: Secondary | ICD-10-CM | POA: Diagnosis not present

## 2015-12-03 DIAGNOSIS — I1310 Hypertensive heart and chronic kidney disease without heart failure, with stage 1 through stage 4 chronic kidney disease, or unspecified chronic kidney disease: Secondary | ICD-10-CM | POA: Diagnosis present

## 2015-12-03 DIAGNOSIS — Z419 Encounter for procedure for purposes other than remedying health state, unspecified: Secondary | ICD-10-CM

## 2015-12-03 DIAGNOSIS — Y929 Unspecified place or not applicable: Secondary | ICD-10-CM

## 2015-12-03 DIAGNOSIS — G4730 Sleep apnea, unspecified: Secondary | ICD-10-CM | POA: Diagnosis present

## 2015-12-03 DIAGNOSIS — N183 Chronic kidney disease, stage 3 (moderate): Secondary | ICD-10-CM | POA: Diagnosis present

## 2015-12-03 DIAGNOSIS — J9811 Atelectasis: Secondary | ICD-10-CM | POA: Diagnosis not present

## 2015-12-03 DIAGNOSIS — R509 Fever, unspecified: Secondary | ICD-10-CM

## 2015-12-03 DIAGNOSIS — Z794 Long term (current) use of insulin: Secondary | ICD-10-CM

## 2015-12-03 DIAGNOSIS — I2510 Atherosclerotic heart disease of native coronary artery without angina pectoris: Secondary | ICD-10-CM | POA: Diagnosis present

## 2015-12-03 DIAGNOSIS — F329 Major depressive disorder, single episode, unspecified: Secondary | ICD-10-CM | POA: Diagnosis present

## 2015-12-03 DIAGNOSIS — F17210 Nicotine dependence, cigarettes, uncomplicated: Secondary | ICD-10-CM | POA: Diagnosis present

## 2015-12-03 DIAGNOSIS — E785 Hyperlipidemia, unspecified: Secondary | ICD-10-CM | POA: Diagnosis present

## 2015-12-03 DIAGNOSIS — M8588 Other specified disorders of bone density and structure, other site: Secondary | ICD-10-CM | POA: Diagnosis present

## 2015-12-03 DIAGNOSIS — Z8249 Family history of ischemic heart disease and other diseases of the circulatory system: Secondary | ICD-10-CM

## 2015-12-03 DIAGNOSIS — H409 Unspecified glaucoma: Secondary | ICD-10-CM | POA: Diagnosis present

## 2015-12-03 DIAGNOSIS — J449 Chronic obstructive pulmonary disease, unspecified: Secondary | ICD-10-CM | POA: Diagnosis present

## 2015-12-03 DIAGNOSIS — Z7982 Long term (current) use of aspirin: Secondary | ICD-10-CM

## 2015-12-03 DIAGNOSIS — K219 Gastro-esophageal reflux disease without esophagitis: Secondary | ICD-10-CM | POA: Diagnosis present

## 2015-12-03 DIAGNOSIS — M79605 Pain in left leg: Secondary | ICD-10-CM | POA: Diagnosis present

## 2015-12-03 DIAGNOSIS — Y939 Activity, unspecified: Secondary | ICD-10-CM | POA: Diagnosis not present

## 2015-12-03 DIAGNOSIS — E119 Type 2 diabetes mellitus without complications: Secondary | ICD-10-CM

## 2015-12-03 DIAGNOSIS — Z7902 Long term (current) use of antithrombotics/antiplatelets: Secondary | ICD-10-CM

## 2015-12-03 DIAGNOSIS — W19XXXA Unspecified fall, initial encounter: Secondary | ICD-10-CM | POA: Diagnosis present

## 2015-12-03 DIAGNOSIS — M8580 Other specified disorders of bone density and structure, unspecified site: Secondary | ICD-10-CM | POA: Diagnosis present

## 2015-12-03 DIAGNOSIS — I252 Old myocardial infarction: Secondary | ICD-10-CM

## 2015-12-03 DIAGNOSIS — Z79899 Other long term (current) drug therapy: Secondary | ICD-10-CM | POA: Diagnosis not present

## 2015-12-03 DIAGNOSIS — E1142 Type 2 diabetes mellitus with diabetic polyneuropathy: Secondary | ICD-10-CM | POA: Diagnosis present

## 2015-12-03 DIAGNOSIS — S72009A Fracture of unspecified part of neck of unspecified femur, initial encounter for closed fracture: Secondary | ICD-10-CM

## 2015-12-03 DIAGNOSIS — R52 Pain, unspecified: Secondary | ICD-10-CM

## 2015-12-03 DIAGNOSIS — G20 Parkinson's disease: Secondary | ICD-10-CM | POA: Diagnosis present

## 2015-12-03 DIAGNOSIS — I25118 Atherosclerotic heart disease of native coronary artery with other forms of angina pectoris: Secondary | ICD-10-CM | POA: Diagnosis present

## 2015-12-03 HISTORY — DX: Type 2 diabetes mellitus without complications: E11.9

## 2015-12-03 LAB — CBC WITH DIFFERENTIAL/PLATELET
Basophils Absolute: 0.1 10*3/uL (ref 0–0.1)
Basophils Relative: 1 %
EOS ABS: 0.3 10*3/uL (ref 0–0.7)
EOS PCT: 2 %
HCT: 49.7 % (ref 40.0–52.0)
Hemoglobin: 17.3 g/dL (ref 13.0–18.0)
LYMPHS ABS: 3.5 10*3/uL (ref 1.0–3.6)
LYMPHS PCT: 20 %
MCH: 32.2 pg (ref 26.0–34.0)
MCHC: 34.8 g/dL (ref 32.0–36.0)
MCV: 92.4 fL (ref 80.0–100.0)
MONO ABS: 1.2 10*3/uL — AB (ref 0.2–1.0)
Monocytes Relative: 7 %
Neutro Abs: 12.2 10*3/uL — ABNORMAL HIGH (ref 1.4–6.5)
Neutrophils Relative %: 70 %
PLATELETS: 188 10*3/uL (ref 150–440)
RBC: 5.38 MIL/uL (ref 4.40–5.90)
RDW: 13.7 % (ref 11.5–14.5)
WBC: 17.3 10*3/uL — AB (ref 3.8–10.6)

## 2015-12-03 LAB — BASIC METABOLIC PANEL
Anion gap: 7 (ref 5–15)
BUN: 20 mg/dL (ref 6–20)
CO2: 28 mmol/L (ref 22–32)
CREATININE: 1.05 mg/dL (ref 0.61–1.24)
Calcium: 8.8 mg/dL — ABNORMAL LOW (ref 8.9–10.3)
Chloride: 103 mmol/L (ref 101–111)
Glucose, Bld: 161 mg/dL — ABNORMAL HIGH (ref 65–99)
POTASSIUM: 3.7 mmol/L (ref 3.5–5.1)
Sodium: 138 mmol/L (ref 135–145)

## 2015-12-03 LAB — APTT: APTT: 33 s (ref 24–36)

## 2015-12-03 LAB — ALBUMIN: ALBUMIN: 3.6 g/dL (ref 3.5–5.0)

## 2015-12-03 LAB — PROTIME-INR
INR: 1.1
PROTHROMBIN TIME: 14.4 s (ref 11.4–15.0)

## 2015-12-03 MED ORDER — CARBOXYMETHYLCELLUL-GLYCERIN 0.5-0.9 % OP SOLN: 1.0000 [drp] | Freq: Three times a day (TID) | OPHTHALMIC | Status: DC | PRN

## 2015-12-03 MED ORDER — MORPHINE SULFATE (PF) 2 MG/ML IV SOLN: 2.0000 mg | INTRAVENOUS | Status: DC | PRN

## 2015-12-03 MED ORDER — POLYVINYL ALCOHOL 1.4 % OP SOLN
1.0000 [drp] | Freq: Three times a day (TID) | OPHTHALMIC | Status: DC | PRN
  Administered 2015-12-05: 1 [drp] via OPHTHALMIC
  Administered 2015-12-06: 2 [drp] via OPHTHALMIC
  Filled 2015-12-03: qty 15

## 2015-12-03 MED ORDER — MOMETASONE FURO-FORMOTEROL FUM 200-5 MCG/ACT IN AERO
2.0000 | INHALATION_SPRAY | Freq: Two times a day (BID) | RESPIRATORY_TRACT | Status: DC
  Administered 2015-12-04 – 2015-12-06 (×5): 2 via RESPIRATORY_TRACT
  Filled 2015-12-03: qty 8.8

## 2015-12-03 MED ORDER — ASPIRIN EC 81 MG PO TBEC: 81.0000 mg | DELAYED_RELEASE_TABLET | Freq: Every day | ORAL | Status: DC

## 2015-12-03 MED ORDER — CEFAZOLIN SODIUM-DEXTROSE 2-4 GM/100ML-% IV SOLN
2.0000 g | INTRAVENOUS | Status: AC
  Administered 2015-12-04: 2 g via INTRAVENOUS
  Filled 2015-12-03: qty 100

## 2015-12-03 MED ORDER — ROFLUMILAST 500 MCG PO TABS
500.0000 ug | ORAL_TABLET | Freq: Every day | ORAL | Status: DC
  Administered 2015-12-04 – 2015-12-06 (×3): 500 ug via ORAL
  Filled 2015-12-03 (×4): qty 1

## 2015-12-03 MED ORDER — CLOPIDOGREL BISULFATE 75 MG PO TABS
75.0000 mg | ORAL_TABLET | Freq: Every day | ORAL | Status: DC
  Administered 2015-12-05 – 2015-12-06 (×2): 75 mg via ORAL
  Filled 2015-12-03 (×2): qty 1

## 2015-12-03 MED ORDER — MORPHINE SULFATE (PF) 2 MG/ML IV SOLN
INTRAVENOUS | Status: AC
  Administered 2015-12-03: 2 mg via INTRAVENOUS
  Filled 2015-12-03: qty 1

## 2015-12-03 MED ORDER — METHOCARBAMOL 1000 MG/10ML IJ SOLN
500.0000 mg | Freq: Four times a day (QID) | INTRAVENOUS | Status: DC | PRN
  Filled 2015-12-03: qty 5

## 2015-12-03 MED ORDER — OXYCODONE HCL 5 MG PO TABS
10.0000 mg | ORAL_TABLET | ORAL | Status: DC | PRN
  Administered 2015-12-04 (×2): 10 mg via ORAL
  Filled 2015-12-03 (×2): qty 2

## 2015-12-03 MED ORDER — TIOTROPIUM BROMIDE MONOHYDRATE 18 MCG IN CAPS
18.0000 ug | ORAL_CAPSULE | Freq: Every day | RESPIRATORY_TRACT | Status: DC
Start: 2015-12-04 — End: 2015-12-06
  Administered 2015-12-04 – 2015-12-06 (×3): 18 ug via RESPIRATORY_TRACT
  Filled 2015-12-03: qty 5

## 2015-12-03 MED ORDER — TIMOLOL MALEATE 0.5 % OP SOLN
1.0000 [drp] | Freq: Two times a day (BID) | OPHTHALMIC | Status: DC
  Administered 2015-12-04 – 2015-12-06 (×5): 1 [drp] via OPHTHALMIC
  Filled 2015-12-03: qty 5

## 2015-12-03 MED ORDER — BUPROPION HCL ER (XL) 150 MG PO TB24
300.0000 mg | ORAL_TABLET | Freq: Every day | ORAL | Status: DC
  Administered 2015-12-04 – 2015-12-06 (×3): 300 mg via ORAL
  Filled 2015-12-03 (×4): qty 2

## 2015-12-03 MED ORDER — METHOCARBAMOL 500 MG PO TABS
500.0000 mg | ORAL_TABLET | Freq: Four times a day (QID) | ORAL | Status: DC | PRN
  Administered 2015-12-04: 500 mg via ORAL
  Filled 2015-12-03: qty 1

## 2015-12-03 MED ORDER — INSULIN ASPART 100 UNIT/ML ~~LOC~~ SOLN
0.0000 [IU] | Freq: Four times a day (QID) | SUBCUTANEOUS | Status: DC
  Administered 2015-12-04 (×2): 2 [IU] via SUBCUTANEOUS
  Administered 2015-12-04: 3 [IU] via SUBCUTANEOUS
  Administered 2015-12-05 (×2): 2 [IU] via SUBCUTANEOUS
  Administered 2015-12-05 (×2): 1 [IU] via SUBCUTANEOUS
  Administered 2015-12-06: 2 [IU] via SUBCUTANEOUS
  Filled 2015-12-03: qty 1
  Filled 2015-12-03: qty 3
  Filled 2015-12-03: qty 1
  Filled 2015-12-03: qty 2
  Filled 2015-12-03: qty 3
  Filled 2015-12-03 (×4): qty 2

## 2015-12-03 MED ORDER — QUETIAPINE FUMARATE 25 MG PO TABS
25.0000 mg | ORAL_TABLET | Freq: Every day | ORAL | Status: DC
Start: 2015-12-03 — End: 2015-12-06
  Administered 2015-12-03 – 2015-12-05 (×3): 25 mg via ORAL
  Filled 2015-12-03 (×3): qty 1

## 2015-12-03 MED ORDER — FAMOTIDINE 20 MG PO TABS
20.0000 mg | ORAL_TABLET | Freq: Every day | ORAL | Status: DC
Start: 2015-12-03 — End: 2015-12-06
  Administered 2015-12-03 – 2015-12-05 (×3): 20 mg via ORAL
  Filled 2015-12-03 (×4): qty 1

## 2015-12-03 MED ORDER — ATORVASTATIN CALCIUM 20 MG PO TABS
80.0000 mg | ORAL_TABLET | Freq: Every day | ORAL | Status: DC
Start: 2015-12-03 — End: 2015-12-06
  Administered 2015-12-03 – 2015-12-05 (×3): 80 mg via ORAL
  Filled 2015-12-03 (×3): qty 4

## 2015-12-03 MED ORDER — BRIMONIDINE TARTRATE-TIMOLOL 0.2-0.5 % OP SOLN: 1.0000 [drp] | Freq: Two times a day (BID) | OPHTHALMIC | Status: DC

## 2015-12-03 MED ORDER — BRIMONIDINE TARTRATE 0.2 % OP SOLN
1.0000 [drp] | Freq: Two times a day (BID) | OPHTHALMIC | Status: DC
  Administered 2015-12-04 – 2015-12-06 (×5): 1 [drp] via OPHTHALMIC
  Filled 2015-12-03: qty 5

## 2015-12-03 MED ORDER — MORPHINE SULFATE (PF) 2 MG/ML IV SOLN
2.0000 mg | Freq: Once | INTRAVENOUS | Status: AC
  Administered 2015-12-03: 2 mg via INTRAVENOUS

## 2015-12-03 MED ORDER — CARBIDOPA-LEVODOPA 25-250 MG PO TABS
1.0000 | ORAL_TABLET | Freq: Four times a day (QID) | ORAL | Status: DC
  Administered 2015-12-04 – 2015-12-06 (×7): 1 via ORAL
  Filled 2015-12-03 (×12): qty 1

## 2015-12-03 NOTE — ED Notes (Signed)
Patient transported to X-ray 

## 2015-12-03 NOTE — ED Notes (Signed)
Lab called and pt needs a repeat green top tube due to hemolysis of the tube that was sent - attempted x1 without success to draw blood - also IV will not draw back blood - will have other RN Ander Purpura) to attempt and draw

## 2015-12-03 NOTE — ED Notes (Signed)
MD Willis at bedside. 

## 2015-12-03 NOTE — ED Notes (Signed)
Pt returned from Xray at this time  

## 2015-12-03 NOTE — ED Notes (Addendum)
Pt to ED from home via EMS after mechanical fall. Per pt, "left knee gave out" causing pt to fall, c/c of left leg pain and left knee pain. Spasm-ing noted to left thigh area with slight deformity noted to left femur. Pedal pulses present and intact bilaterally. Pain 9/10. Pt AAOx4, vitals stable at this time. Md Archie Balboa at bedside upon arrival.

## 2015-12-03 NOTE — ED Notes (Signed)
MD at bedside. 

## 2015-12-03 NOTE — ED Provider Notes (Signed)
[facility] Emergency Department Provider Note   ____________________________________________  Time seen: On EMS arrival  I have reviewed the triage vital signs and the nursing notes.   HISTORY  Chief Complaint Fall and Leg Pain   History limited by: Not Limited   HPI Jimmy Hill is a 75 y.o. male who presents to the emergency department today because of concerns for left leg pain. Patient fell on his knee roughly 3 hours ago. He states that his left leg gave out. This does happen occasionally to the patient. He fell straight onto his knee. The pain continued to get worse throughout the day. Pain is located at his left knee. He denies any new numbness or tingling in his leg although has a history of neuropathy in that foot. Denies any chest pain or shortness of breath today. Denies any recent fevers.   Past Medical History  Diagnosis Date  . Myocardial infarction ([place])   . Coronary artery disease   . Anginal pain ([place])   . Parkinson's disease ([place])   . Diabetes mellitus without complication ([place])   . Tinnitus   . Peripheral vascular disease ([place])   . Dysphagia   . Glaucoma   . Autonomic dysreflexia   . H/O orthostatic hypotension   . RLS (restless legs syndrome)   . Depression   . Chronic kidney disease     STAGE 3  . Strabismic amblyopia   . Smoker   . Polyneuropathy ([place])   . Arthritis   . DJD (degenerative joint disease)   . Back pain     CHRONIC LBP,STENOSIS  . COPD (chronic obstructive pulmonary disease) ([place])   . Allergic rhinitis   . Sleep apnea   . Pneumonia   . CHF (congestive heart failure) ([place])   . GERD (gastroesophageal reflux disease)   . Edema     FEET/ANKLES    Patient Active Problem List   Diagnosis Date Noted  . COPD exacerbation ([place]) 02/05/2015  . Acute bronchitis 02/05/2015  . Diabetes mellitus type 2, uncontrolled ([place]) 02/05/2015  . Chest pain 12/09/2014    Past Surgical History  Procedure Laterality  Date  . Coronary angioplasty      STENT  . Back surgery    . Eye surgery    . Appendectomy    . Ankle surgery    . Ctr      Current Outpatient Rx  Name  Route  Sig  Dispense  Refill  . acetaminophen (TYLENOL) 325 MG tablet   Oral   Take 650 mg by mouth every 6 (six) hours as needed for mild pain or headache.         . albuterol (PROVENTIL) (2.5 MG/3ML) 0.083% nebulizer solution   Nebulization   Take 2.5 mg by nebulization every 4 (four) hours as needed for wheezing or shortness of breath.         Marland Kitchen aspirin EC 81 MG tablet   Oral   Take 81 mg by mouth daily.         Marland Kitchen atorvastatin (LIPITOR) 80 MG tablet   Oral   Take 80 mg by mouth at bedtime.         . brimonidine-timolol (COMBIGAN) 0.2-0.5 % ophthalmic solution   Both Eyes   Place 1 drop into both eyes 2 (two) times daily.         Marland Kitchen buPROPion (WELLBUTRIN XL) 300 MG 24 hr tablet   Oral   Take 300 mg by mouth daily.         Marland Kitchen  Calcium Carbonate-Vitamin D (CALCIUM 600+D) 600-400 MG-UNIT per tablet   Oral   Take 1 tablet by mouth daily.         . carbidopa-levodopa (SINEMET IR) 25-250 MG tablet   Oral   Take 1 tablet by mouth 4 (four) times daily.         . Carboxymethylcellul-Glycerin (OPTIVE) 0.5-0.9 % SOLN   Ophthalmic   Apply 1-2 drops to eye 3 (three) times daily as needed (for dry eyes).         . cetirizine (ZYRTEC) 10 MG tablet   Oral   Take 10 mg by mouth at bedtime.         . clopidogrel (PLAVIX) 75 MG tablet   Oral   Take 75 mg by mouth daily.         . fluticasone (FLONASE) 50 MCG/ACT nasal spray   Each Nare   Place 2 sprays into both nostrils daily as needed for rhinitis.         . Fluticasone-Salmeterol (ADVAIR) 500-50 MCG/DOSE AEPB   Inhalation   Inhale into the lungs 2 (two) times daily.         Marland Kitchen gabapentin (NEURONTIN) 800 MG tablet   Oral   Take 600 mg by mouth. 1 AM 1 SUPPERTIME 2 HS         . guaiFENesin (MUCINEX) 600 MG 12 hr tablet   Oral   Take 600  mg by mouth daily.         Marland Kitchen HYDROcodone-acetaminophen (NORCO/VICODIN) 5-325 MG per tablet   Oral   Take 1 tablet by mouth every 6 (six) hours as needed for moderate pain.         Marland Kitchen insulin glargine (LANTUS) 100 UNIT/ML injection   Subcutaneous   Inject into the skin 2 (two) times daily. 45 UNITS AM 40 UNITS PM         . lidocaine (XYLOCAINE) 5 % ointment   Topical   Apply 1 application topically 2 (two) times daily as needed. TO FEET         . metFORMIN (GLUCOPHAGE) 1000 MG tablet   Oral   Take 1,000 mg by mouth.         . nicotine (NICODERM CQ - DOSED IN MG/24 HOURS) 21 mg/24hr patch   Transdermal   Place 21 mg onto the skin daily. X 6 WEEKS         . nitroGLYCERIN (NITROSTAT) 0.4 MG SL tablet   Sublingual   Place 0.4 mg under the tongue every 5 (five) minutes as needed for chest pain.         Marland Kitchen QUEtiapine (SEROQUEL) 25 MG tablet   Oral   Take 25 mg by mouth at bedtime.         . ranitidine (ZANTAC) 150 MG tablet   Oral   Take 150 mg by mouth at bedtime.         . roflumilast (DALIRESP) 500 MCG TABS tablet   Oral   Take 500 mcg by mouth daily.         . sitaGLIPtin (JANUVIA) 100 MG tablet   Oral   Take 100 mg by mouth daily.         Marland Kitchen tiotropium (SPIRIVA) 18 MCG inhalation capsule   Inhalation   Place 18 mcg into inhaler and inhale daily. 2 PUFFS         . vitamin B-12 (CYANOCOBALAMIN) 1000 MCG tablet   Oral   Take 1,000 mcg by  mouth daily.           Allergies Review of patient's allergies indicates no known allergies.  No family history on file.  Social History Social History  Substance Use Topics  . Smoking status: Current Some Day Smoker -- 0.50 packs/day    Types: Cigarettes  . Smokeless tobacco: Never Used  . Alcohol Use: No    Review of Systems  Constitutional: Negative for fever. Cardiovascular: Negative for chest pain. Respiratory: Negative for shortness of breath. Gastrointestinal: Negative for abdominal  pain, vomiting and diarrhea. Genitourinary: Negative for dysuria. Musculoskeletal: Positive for left leg. Skin: Negative for rash. Neurological: Negative for headaches, focal weakness or numbness.   10-point ROS otherwise negative.  ____________________________________________   PHYSICAL EXAM:  VITAL SIGNS:    98.1 F (36.7 C)  90   22  117/72 mmHg  95 %   Constitutional: Alert and oriented. Appears uncomfortable. Eyes: Conjunctivae are normal. PERRL. Normal extraocular movements. ENT   Head: Normocephalic and atraumatic.   Nose: No congestion/rhinnorhea.   Mouth/Throat: Mucous membranes are moist.   Neck: No stridor. Hematological/Lymphatic/Immunilogical: No cervical lymphadenopathy. Cardiovascular: Normal rate, regular rhythm.  No murmurs, rubs, or gallops. Respiratory: Normal respiratory effort without tachypnea nor retractions. Breath sounds are clear and equal bilaterally. No wheezes/rales/rhonchi. Gastrointestinal: Soft and nontender. No distention.  Genitourinary: Deferred Musculoskeletal: Left thigh with muscle spasms, hip and femur tender to palpation and manipulation. Neurologic:  Normal speech and language. No gross focal neurologic deficits are appreciated.  Skin:  Skin is warm, dry and intact. No rash noted. Psychiatric: Mood and affect are normal. Speech and behavior are normal. Patient exhibits appropriate insight and judgment.  ____________________________________________    LABS (pertinent positives/negatives)  Labs Reviewed  CBC WITH DIFFERENTIAL/PLATELET - Abnormal; Notable for the following:    WBC 17.3 (*)    Neutro Abs 12.2 (*)    Monocytes Absolute 1.2 (*)    All other components within normal limits  URINE CULTURE  PROTIME-INR  CBC  BASIC METABOLIC PANEL  APTT  ALBUMIN  TYPE AND SCREEN     ____________________________________________   EKG  Apolonio Schneiders, attending physician, personally viewed and interpreted this  EKG  EKG Time: 2033 Rate: 96 Rhythm: accelerated junctional rhythm? No clear p waves Axis: normal Intervals: qtc 411 QRS: narrow ST changes: no st elevation Impression: abnormal ekg   ____________________________________________    RADIOLOGY  Left femur  IMPRESSION: Acute left hip subcapital femoral neck fracture.  Osteopenia  Peripheral atherosclerosis  I, Md Smola, personally viewed and evaluated these images (plain radiographs) as part of my medical decision making. ____________________________________________   PROCEDURES  Procedure(s) performed: None  Critical Care performed: No  ____________________________________________   INITIAL IMPRESSION / ASSESSMENT AND PLAN / ED COURSE  Pertinent labs & imaging results that were available during my care of the patient were reviewed by me and considered in my medical decision making (see chart for details).  Patient presented to the emergency department today after a fall onto his left knee and subsequent left leg pain. Exam was concerning for tenderness about the femur. X-ray did show subcapital fracture. Will plan on admission to the hospitalist service.  ____________________________________________   FINAL CLINICAL IMPRESSION(S) / ED DIAGNOSES  Final diagnoses:  Pain  Hip fracture, left, closed, initial encounter [facility])     Note: This dictation was prepared with Dragon dictation. Any transcriptional errors that result from this process are unintentional    Nance Pear, MD 12/03/15 2132

## 2015-12-03 NOTE — ED Notes (Signed)
Pt transported to room 158. 

## 2015-12-03 NOTE — H&P (Signed)
[facility] [place] NAME: Jimmy Hill    MR#:  [id number]  DATE OF BIRTH:  1940/10/26  DATE OF ADMISSION:  12/03/2015  PRIMARY CARE PHYSICIAN: Gareth Morgan, MD   REQUESTING/REFERRING PHYSICIAN: Archie Balboa, MD  CHIEF COMPLAINT:   Chief Complaint  Patient presents with  . Fall  . Leg Pain    HISTORY OF PRESENT ILLNESS:  Jimmy Hill  is a 75 y.o. male who presents with Fall at home and subsequent left hip fracture. Orthopedic surgery recommended surgical repair, and requests hospitalists admit.  PAST MEDICAL HISTORY:   Past Medical History  Diagnosis Date  . Myocardial infarction ([place])   . Coronary artery disease   . Anginal pain ([place])   . Parkinson's disease ([place])   . Diabetes mellitus without complication ([place])   . Tinnitus   . Peripheral vascular disease ([place])   . Dysphagia   . Glaucoma   . Autonomic dysreflexia   . H/O orthostatic hypotension   . RLS (restless legs syndrome)   . Depression   . Chronic kidney disease     STAGE 3  . Strabismic amblyopia   . Smoker   . Polyneuropathy ([place])   . Arthritis   . DJD (degenerative joint disease)   . Back pain     CHRONIC LBP,STENOSIS  . COPD (chronic obstructive pulmonary disease) ([place])   . Allergic rhinitis   . Sleep apnea   . CHF (congestive heart failure) ([place])   . GERD (gastroesophageal reflux disease)   . Edema     FEET/ANKLES  . Diabetes ([place])     PAST SURGICAL HISTORY:   Past Surgical History  Procedure Laterality Date  . Coronary angioplasty      STENT  . Back surgery    . Eye surgery    . Appendectomy    . Ankle surgery    . Ctr      SOCIAL HISTORY:   Social History  Substance Use Topics  . Smoking status: Current Some Day Smoker -- 0.50 packs/day    Types: Cigarettes  . Smokeless tobacco: Never Used  . Alcohol Use: No    FAMILY HISTORY:   Family History  Problem Relation Age of Onset  . Heart disease Father     DRUG  ALLERGIES:  No Known Allergies  MEDICATIONS AT HOME:   Prior to Admission medications   Medication Sig Start Date End Date Taking? Authorizing Provider  acetaminophen (TYLENOL) 325 MG tablet Take 650 mg by mouth every 6 (six) hours as needed for mild pain or headache.    Historical Provider, MD  albuterol (PROVENTIL) (2.5 MG/3ML) 0.083% nebulizer solution Take 2.5 mg by nebulization every 4 (four) hours as needed for wheezing or shortness of breath.    Historical Provider, MD  aspirin EC 81 MG tablet Take 81 mg by mouth daily.    Historical Provider, MD  atorvastatin (LIPITOR) 80 MG tablet Take 80 mg by mouth at bedtime.    Historical Provider, MD  brimonidine-timolol (COMBIGAN) 0.2-0.5 % ophthalmic solution Place 1 drop into both eyes 2 (two) times daily.    Historical Provider, MD  buPROPion (WELLBUTRIN XL) 300 MG 24 hr tablet Take 300 mg by mouth daily.    Historical Provider, MD  Calcium Carbonate-Vitamin D (CALCIUM 600+D) 600-400 MG-UNIT per tablet Take 1 tablet by mouth daily.    Historical Provider, MD  carbidopa-levodopa (SINEMET IR) 25-250 MG tablet Take 1 tablet by mouth 4 (four)  times daily.    Historical Provider, MD  Carboxymethylcellul-Glycerin (OPTIVE) 0.5-0.9 % SOLN Apply 1-2 drops to eye 3 (three) times daily as needed (for dry eyes).    Historical Provider, MD  cetirizine (ZYRTEC) 10 MG tablet Take 10 mg by mouth at bedtime.    Historical Provider, MD  clopidogrel (PLAVIX) 75 MG tablet Take 75 mg by mouth daily.    Historical Provider, MD  fluticasone (FLONASE) 50 MCG/ACT nasal spray Place 2 sprays into both nostrils daily as needed for rhinitis.    Historical Provider, MD  Fluticasone-Salmeterol (ADVAIR) 500-50 MCG/DOSE AEPB Inhale into the lungs 2 (two) times daily.    Historical Provider, MD  gabapentin (NEURONTIN) 800 MG tablet Take 600 mg by mouth. 1 AM 1 SUPPERTIME 2 HS    Historical Provider, MD  guaiFENesin (MUCINEX) 600 MG 12 hr tablet Take 600 mg by mouth daily.     Historical Provider, MD  HYDROcodone-acetaminophen (NORCO/VICODIN) 5-325 MG per tablet Take 1 tablet by mouth every 6 (six) hours as needed for moderate pain.    Historical Provider, MD  insulin glargine (LANTUS) 100 UNIT/ML injection Inject into the skin 2 (two) times daily. 45 UNITS AM 40 UNITS PM    Historical Provider, MD  lidocaine (XYLOCAINE) 5 % ointment Apply 1 application topically 2 (two) times daily as needed. TO FEET    Historical Provider, MD  metFORMIN (GLUCOPHAGE) 1000 MG tablet Take 1,000 mg by mouth.    Historical Provider, MD  nicotine (NICODERM CQ - DOSED IN MG/24 HOURS) 21 mg/24hr patch Place 21 mg onto the skin daily. X 6 WEEKS    Historical Provider, MD  nitroGLYCERIN (NITROSTAT) 0.4 MG SL tablet Place 0.4 mg under the tongue every 5 (five) minutes as needed for chest pain.    Historical Provider, MD  QUEtiapine (SEROQUEL) 25 MG tablet Take 25 mg by mouth at bedtime. 04/05/15   Historical Provider, MD  ranitidine (ZANTAC) 150 MG tablet Take 150 mg by mouth at bedtime.    Historical Provider, MD  roflumilast (DALIRESP) 500 MCG TABS tablet Take 500 mcg by mouth daily.    Historical Provider, MD  sitaGLIPtin (JANUVIA) 100 MG tablet Take 100 mg by mouth daily.    Historical Provider, MD  tiotropium (SPIRIVA) 18 MCG inhalation capsule Place 18 mcg into inhaler and inhale daily. 2 PUFFS    Historical Provider, MD  vitamin B-12 (CYANOCOBALAMIN) 1000 MCG tablet Take 1,000 mcg by mouth daily.    Historical Provider, MD    REVIEW OF SYSTEMS:  Review of Systems  Constitutional: Negative for fever, chills, weight loss and malaise/fatigue.  HENT: Negative for ear pain, hearing loss and tinnitus.   Eyes: Negative for blurred vision, double vision, pain and redness.  Respiratory: Negative for cough, hemoptysis and shortness of breath.   Cardiovascular: Negative for chest pain, palpitations, orthopnea and leg swelling.  Gastrointestinal: Negative for nausea, vomiting, abdominal pain,  diarrhea and constipation.  Genitourinary: Negative for dysuria, frequency and hematuria.  Musculoskeletal: Positive for joint pain (left hip) and falls. Negative for back pain and neck pain.  Skin:       No acne, rash, or lesions  Neurological: Negative for dizziness, tremors, focal weakness and weakness.  Endo/Heme/Allergies: Negative for polydipsia. Does not bruise/bleed easily.  Psychiatric/Behavioral: Negative for depression. The patient is not nervous/anxious and does not have insomnia.      VITAL SIGNS:   Filed Vitals:   12/03/15 1936  BP: 117/72  Pulse: 90  Temp: 98.1 F (  36.7 C)  TempSrc: Oral  Resp: 22  Height: 5\' 8"  (1.727 m)  Weight: 82.146 kg (181 lb 1.6 oz)  SpO2: 95%   Wt Readings from Last 3 Encounters:  12/03/15 82.146 kg (181 lb 1.6 oz)  11/28/15 83.462 kg (184 lb)  05/29/15 90.719 kg (200 lb)    PHYSICAL EXAMINATION:  Physical Exam  Vitals reviewed. Constitutional: He is oriented to person, place, and time. He appears well-developed and well-nourished. No distress.  HENT:  Head: Normocephalic and atraumatic.  Mouth/Throat: Oropharynx is clear and moist.  Eyes: Conjunctivae and EOM are normal. Pupils are equal, round, and reactive to light. No scleral icterus.  Neck: Normal range of motion. Neck supple. No JVD present. No thyromegaly present.  Cardiovascular: Normal rate, regular rhythm and intact distal pulses.  Exam reveals no gallop and no friction rub.   No murmur heard. Respiratory: Effort normal and breath sounds normal. No respiratory distress. He has no wheezes. He has no rales.  GI: Soft. Bowel sounds are normal. He exhibits no distension. There is no tenderness.  Musculoskeletal: He exhibits no edema.  No arthritis, no gout.  Left hip tenderness  Lymphadenopathy:    He has no cervical adenopathy.  Neurological: He is alert and oriented to person, place, and time. No cranial nerve deficit.  No dysarthria, no aphasia  Skin: Skin is warm and  dry. No rash noted. No erythema.  Psychiatric: He has a normal mood and affect. His behavior is normal. Judgment and thought content normal.    LABORATORY PANEL:   CBC  Recent Labs Lab 12/03/15 2039  WBC 17.3*  HGB 17.3  HCT 49.7  PLT 188   ------------------------------------------------------------------------------------------------------------------  Chemistries  No results for input(s): NA, K, CL, CO2, GLUCOSE, BUN, CREATININE, CALCIUM, MG, AST, ALT, ALKPHOS, BILITOT in the last 168 hours.  Invalid input(s): GFRCGP ------------------------------------------------------------------------------------------------------------------  Cardiac Enzymes No results for input(s): TROPONINI in the last 168 hours. ------------------------------------------------------------------------------------------------------------------  RADIOLOGY:  Dg Chest 1 View  12/03/2015  CLINICAL DATA:  Preop evaluation for left hip surgery. EXAM: CHEST 1 VIEW COMPARISON:  02/05/2015 FINDINGS: Normal heart size and vascularity. Mild hyperinflation without focal pneumonia, collapse or consolidation. No edema, effusion or pneumothorax. Trachea is midline. Atherosclerosis of the aorta. IMPRESSION: Stable chest exam.  No acute process. Thoracic aortic atherosclerosis Electronically Signed   By: Jerilynn Mages.  Shick M.D.   On: 12/03/2015 20:48   Dg Femur Min 2 Views Left  12/03/2015  CLINICAL DATA:  Recent fall, left lower extremity pain. EXAM: LEFT FEMUR 2 VIEWS COMPARISON:  None available FINDINGS: Bones are osteopenic. Cortical buckling noted of the left proximal femur subcapital neck compatible with an acute hip fracture. No associated malalignment. Distal femur appears intact. Visualized bony pelvis unremarkable. Lower lumbar fusion hardware partially imaged. Peripheral atherosclerosis noted. IMPRESSION: Acute left hip subcapital femoral neck fracture. Osteopenia Peripheral atherosclerosis Electronically Signed   By: Jerilynn Mages.   Shick M.D.   On: 12/03/2015 20:21    EKG:   Orders placed or performed during the hospital encounter of 12/03/15  . ED EKG  . ED EKG    IMPRESSION AND PLAN:  Principal Problem:   Fracture of femoral neck, left (HCC) - patient states that he fell after his leg "gave out." He states that it is done this in the past, but today he fell onto the left hip and suffered a fracture. Orthopedics placed orders and plans for surgical repair. Patient has significant cardiac history, with multiple stents in the past (he states  5 stents), and today an EKG is in a junctional rhythm. Due to the same we will defer cardiac risk stratification cardiology, consult placed. Active Problems:   COPD (chronic obstructive pulmonary disease) ([place]) - continue home inhalers   Type 2 diabetes mellitus (HCC) - sliding scale insulin with corresponding glucose checks   CAD (coronary artery disease) - continue home meds, cardiology consult as above   Sleep apnea - CPAP daily at bedtime  All the records are reviewed and case discussed with ED provider. Management plans discussed with the patient and/or family.  DVT PROPHYLAXIS: Mechanical only  GI PROPHYLAXIS: H2 blocker  ADMISSION STATUS: Inpatient  CODE STATUS:     Code Status Orders        Start     Ordered   12/03/15 2057  Full code   Continuous     12/03/15 2059    Code Status History    Date Active Date Inactive Code Status Order ID Comments User Context   02/05/2015  4:14 PM 02/06/2015  2:04 PM Full Code [id number]  Theodoro Grist, MD Inpatient   12/10/2014 12:47 AM 12/10/2014  4:36 PM Full Code [id number]  Nicholes Mango, MD Inpatient    Full Code  TOTAL TIME TAKING CARE OF THIS PATIENT: 45 minutes.    Zalaya Astarita Mentor 12/03/2015, 9:21 PM  [facility]  Office  [phone]  CC: Primary care physician; Gareth Morgan, MD

## 2015-12-03 NOTE — ED Notes (Signed)
This RN attempted to call report to floor, RN unable to take report at this time, will call this RN back.

## 2015-12-04 ENCOUNTER — Inpatient Hospital Stay: Payer: Medicare (Managed Care) | Admitting: Certified Registered"

## 2015-12-04 ENCOUNTER — Encounter: Payer: Self-pay | Admitting: Anesthesiology

## 2015-12-04 ENCOUNTER — Ambulatory Visit: Admission: RE | Admit: 2015-12-04 | Payer: Medicare (Managed Care) | Source: Ambulatory Visit | Admitting: Ophthalmology

## 2015-12-04 ENCOUNTER — Inpatient Hospital Stay: Payer: Medicare (Managed Care)

## 2015-12-04 ENCOUNTER — Encounter
Admission: EM | Disposition: A | Discharge status: Skilled Nursing Facility | Payer: Self-pay | Source: Home / Self Care | Attending: Specialist

## 2015-12-04 HISTORY — DX: Chronic kidney disease, unspecified: N18.9

## 2015-12-04 HISTORY — DX: Sleep apnea, unspecified: G47.30

## 2015-12-04 HISTORY — PX: HIP PINNING,CANNULATED: SHX1758

## 2015-12-04 HISTORY — DX: Strabismic amblyopia, unspecified eye: H53.039

## 2015-12-04 HISTORY — DX: Restless legs syndrome: G25.81

## 2015-12-04 HISTORY — DX: Chronic obstructive pulmonary disease, unspecified: J44.9

## 2015-12-04 HISTORY — DX: Depression, unspecified: F32.A

## 2015-12-04 HISTORY — DX: Unspecified osteoarthritis, unspecified site: M19.90

## 2015-12-04 HISTORY — DX: Gastro-esophageal reflux disease without esophagitis: K21.9

## 2015-12-04 HISTORY — DX: Dysphagia, unspecified: R13.10

## 2015-12-04 HISTORY — DX: Tinnitus, unspecified ear: H93.19

## 2015-12-04 HISTORY — DX: Unspecified glaucoma: H40.9

## 2015-12-04 HISTORY — DX: Nicotine dependence, unspecified, uncomplicated: F17.200

## 2015-12-04 HISTORY — DX: Autonomic dysreflexia: G90.4

## 2015-12-04 HISTORY — DX: Major depressive disorder, single episode, unspecified: F32.9

## 2015-12-04 HISTORY — DX: Allergic rhinitis, unspecified: J30.9

## 2015-12-04 HISTORY — DX: Heart failure, unspecified: I50.9

## 2015-12-04 HISTORY — DX: Personal history of other diseases of the circulatory system: Z86.79

## 2015-12-04 HISTORY — DX: Edema, unspecified: R60.9

## 2015-12-04 HISTORY — DX: Peripheral vascular disease, unspecified: I73.9

## 2015-12-04 HISTORY — DX: Polyneuropathy, unspecified: G62.9

## 2015-12-04 HISTORY — DX: Dorsalgia, unspecified: M54.9

## 2015-12-04 HISTORY — DX: Pneumonia, unspecified organism: J18.9

## 2015-12-04 LAB — CBC
HCT: 45 % (ref 40.0–52.0)
HEMOGLOBIN: 15.8 g/dL (ref 13.0–18.0)
MCH: 32 pg (ref 26.0–34.0)
MCHC: 35.1 g/dL (ref 32.0–36.0)
MCV: 91.1 fL (ref 80.0–100.0)
Platelets: 169 10*3/uL (ref 150–440)
RBC: 4.93 MIL/uL (ref 4.40–5.90)
RDW: 14.1 % (ref 11.5–14.5)
WBC: 14.7 10*3/uL — AB (ref 3.8–10.6)

## 2015-12-04 LAB — TYPE AND SCREEN
ABO/RH(D): A POS
ANTIBODY SCREEN: NEGATIVE

## 2015-12-04 LAB — SURGICAL PCR SCREEN
MRSA, PCR: NEGATIVE
STAPHYLOCOCCUS AUREUS: NEGATIVE

## 2015-12-04 LAB — GLUCOSE, CAPILLARY
GLUCOSE-CAPILLARY: 123 mg/dL — AB (ref 65–99)
GLUCOSE-CAPILLARY: 132 mg/dL — AB (ref 65–99)
GLUCOSE-CAPILLARY: 227 mg/dL — AB (ref 65–99)
Glucose-Capillary: 196 mg/dL — ABNORMAL HIGH (ref 65–99)
Glucose-Capillary: 146 mg/dL — ABNORMAL HIGH (ref 65–99)

## 2015-12-04 SURGERY — PHACOEMULSIFICATION, CATARACT, WITH IOL INSERTION
Anesthesia: Choice | Laterality: Left

## 2015-12-04 SURGERY — FIXATION, FEMUR, NECK, PERCUTANEOUS, USING SCREW
Anesthesia: General | Laterality: Left

## 2015-12-04 MED ORDER — DOCUSATE SODIUM 100 MG PO CAPS
100.0000 mg | ORAL_CAPSULE | Freq: Two times a day (BID) | ORAL | Status: DC
  Administered 2015-12-04 – 2015-12-06 (×4): 100 mg via ORAL
  Filled 2015-12-04 (×4): qty 1

## 2015-12-04 MED ORDER — SODIUM CHLORIDE 0.9 % IV SOLN
INTRAVENOUS | Status: DC
  Administered 2015-12-04 (×2): via INTRAVENOUS

## 2015-12-04 MED ORDER — ONDANSETRON HCL 4 MG/2ML IJ SOLN: 4.0000 mg | Freq: Once | INTRAMUSCULAR | Status: DC | PRN

## 2015-12-04 MED ORDER — ONDANSETRON HCL 4 MG PO TABS: 4.0000 mg | ORAL_TABLET | Freq: Four times a day (QID) | ORAL | Status: DC | PRN

## 2015-12-04 MED ORDER — ALUM & MAG HYDROXIDE-SIMETH 200-200-20 MG/5ML PO SUSP: 30.0000 mL | ORAL | Status: DC | PRN

## 2015-12-04 MED ORDER — POLYETHYLENE GLYCOL 3350 17 G PO PACK: 17.0000 g | PACK | Freq: Every day | ORAL | Status: DC | PRN

## 2015-12-04 MED ORDER — SENNA 8.6 MG PO TABS
1.0000 | ORAL_TABLET | Freq: Two times a day (BID) | ORAL | Status: DC
  Administered 2015-12-04 – 2015-12-06 (×4): 8.6 mg via ORAL
  Filled 2015-12-04 (×4): qty 1

## 2015-12-04 MED ORDER — ACETAMINOPHEN 650 MG RE SUPP: 650.0000 mg | Freq: Four times a day (QID) | RECTAL | Status: DC | PRN

## 2015-12-04 MED ORDER — BUPIVACAINE HCL 0.25 % IJ SOLN
INTRAMUSCULAR | Status: DC | PRN
  Administered 2015-12-04: 30 mL

## 2015-12-04 MED ORDER — KETAMINE HCL 50 MG/ML IJ SOLN
INTRAMUSCULAR | Status: DC | PRN
  Administered 2015-12-04: 25 mg via INTRAVENOUS
  Administered 2015-12-04: 25 mg via INTRAMUSCULAR

## 2015-12-04 MED ORDER — MAGNESIUM CITRATE PO SOLN
1.0000 | Freq: Once | ORAL | Status: DC | PRN
  Filled 2015-12-04: qty 296

## 2015-12-04 MED ORDER — CEFAZOLIN SODIUM-DEXTROSE 2-4 GM/100ML-% IV SOLN
2.0000 g | Freq: Four times a day (QID) | INTRAVENOUS | Status: AC
Start: 2015-12-04 — End: 2015-12-04
  Administered 2015-12-04 (×2): 2 g via INTRAVENOUS
  Filled 2015-12-04 (×2): qty 100

## 2015-12-04 MED ORDER — FENTANYL CITRATE (PF) 100 MCG/2ML IJ SOLN
INTRAMUSCULAR | Status: DC | PRN
  Administered 2015-12-04 (×2): 25 ug via INTRAVENOUS
  Administered 2015-12-04: 50 ug via INTRAVENOUS

## 2015-12-04 MED ORDER — PHENYLEPHRINE HCL 10 MG/ML IJ SOLN
INTRAMUSCULAR | Status: DC | PRN
  Administered 2015-12-04 (×2): 100 ug via INTRAVENOUS
  Administered 2015-12-04: 200 ug via INTRAVENOUS
  Administered 2015-12-04: 100 ug via INTRAVENOUS
  Administered 2015-12-04: 200 ug via INTRAVENOUS
  Administered 2015-12-04 (×3): 100 ug via INTRAVENOUS
  Administered 2015-12-04: 200 ug via INTRAVENOUS
  Administered 2015-12-04: 100 ug via INTRAVENOUS

## 2015-12-04 MED ORDER — FENTANYL CITRATE (PF) 100 MCG/2ML IJ SOLN: 25.0000 ug | INTRAMUSCULAR | Status: DC | PRN

## 2015-12-04 MED ORDER — LABETALOL HCL 5 MG/ML IV SOLN
10.0000 mg | INTRAVENOUS | Status: DC | PRN
  Administered 2015-12-04: 10 mg via INTRAVENOUS
  Filled 2015-12-04 (×2): qty 4

## 2015-12-04 MED ORDER — GLYCOPYRROLATE 0.2 MG/ML IJ SOLN
INTRAMUSCULAR | Status: DC | PRN
  Administered 2015-12-04: 0.2 mg via INTRAVENOUS

## 2015-12-04 MED ORDER — MORPHINE SULFATE (PF) 2 MG/ML IV SOLN: 2.0000 mg | INTRAVENOUS | Status: DC | PRN

## 2015-12-04 MED ORDER — SODIUM CHLORIDE 0.9 % IV SOLN
75.0000 mL/h | INTRAVENOUS | Status: DC
  Administered 2015-12-05: 75 mL/h via INTRAVENOUS

## 2015-12-04 MED ORDER — BISACODYL 10 MG RE SUPP: 10.0000 mg | Freq: Every day | RECTAL | Status: DC | PRN

## 2015-12-04 MED ORDER — ONDANSETRON HCL 4 MG/2ML IJ SOLN: 4.0000 mg | Freq: Four times a day (QID) | INTRAMUSCULAR | Status: DC | PRN

## 2015-12-04 MED ORDER — PROPOFOL 10 MG/ML IV BOLUS
INTRAVENOUS | Status: DC | PRN
  Administered 2015-12-04: 100 mg via INTRAVENOUS

## 2015-12-04 MED ORDER — NEOMYCIN-POLYMYXIN B GU 40-200000 IR SOLN
Status: DC | PRN
Start: 2015-12-04 — End: 2015-12-04
  Administered 2015-12-04: 2 mL

## 2015-12-04 MED ORDER — LIDOCAINE HCL (PF) 2 % IJ SOLN
INTRAMUSCULAR | Status: DC | PRN
  Administered 2015-12-04: 50 mg

## 2015-12-04 MED ORDER — ACETAMINOPHEN 325 MG PO TABS
650.0000 mg | ORAL_TABLET | Freq: Four times a day (QID) | ORAL | Status: DC | PRN
  Administered 2015-12-05: 650 mg via ORAL
  Filled 2015-12-04: qty 2

## 2015-12-04 MED ORDER — OXYCODONE HCL 5 MG PO TABS
10.0000 mg | ORAL_TABLET | ORAL | Status: DC | PRN
  Administered 2015-12-04 – 2015-12-06 (×5): 10 mg via ORAL
  Filled 2015-12-04 (×5): qty 2

## 2015-12-04 MED ORDER — ASPIRIN EC 325 MG PO TBEC
325.0000 mg | DELAYED_RELEASE_TABLET | Freq: Two times a day (BID) | ORAL | Status: DC
  Administered 2015-12-04 – 2015-12-06 (×4): 325 mg via ORAL
  Filled 2015-12-04 (×4): qty 1

## 2015-12-04 MED ORDER — MENTHOL 3 MG MT LOZG
1.0000 | LOZENGE | OROMUCOSAL | Status: DC | PRN
  Filled 2015-12-04: qty 9

## 2015-12-04 MED ORDER — ONDANSETRON HCL 4 MG/2ML IJ SOLN
INTRAMUSCULAR | Status: DC | PRN
  Administered 2015-12-04: 4 mg via INTRAVENOUS

## 2015-12-04 MED ORDER — CEFAZOLIN SODIUM-DEXTROSE 2-4 GM/100ML-% IV SOLN
INTRAVENOUS | Status: AC
  Filled 2015-12-04: qty 100

## 2015-12-04 MED ORDER — PHENOL 1.4 % MT LIQD
1.0000 | OROMUCOSAL | Status: DC | PRN
  Filled 2015-12-04: qty 177

## 2015-12-04 SURGICAL SUPPLY — 36 items
BLADE SURG SZ11 CARB STEEL (BLADE) ×3 IMPLANT
BNDG COHESIVE 6X5 TAN STRL LF (GAUZE/BANDAGES/DRESSINGS) ×3 IMPLANT
CANISTER SUCT 1200ML W/VALVE (MISCELLANEOUS) ×3 IMPLANT
CATH TRAY 16F METER LATEX (MISCELLANEOUS) IMPLANT
DRAPE SURG 17X11 SM STRL (DRAPES) ×3 IMPLANT
DRAPE U-SHAPE 47X51 STRL (DRAPES) ×3 IMPLANT
DRSG OPSITE POSTOP 3X4 (GAUZE/BANDAGES/DRESSINGS) IMPLANT
DRSG OPSITE POSTOP 4X6 (GAUZE/BANDAGES/DRESSINGS) ×3 IMPLANT
DURAPREP 26ML APPLICATOR (WOUND CARE) ×6 IMPLANT
ELECT REM PT RETURN 9FT ADLT (ELECTROSURGICAL) ×3
ELECTRODE REM PT RTRN 9FT ADLT (ELECTROSURGICAL) ×1 IMPLANT
GAUZE SPONGE 4X4 12PLY STRL (GAUZE/BANDAGES/DRESSINGS) ×3 IMPLANT
GLOVE BIOGEL PI IND STRL 9 (GLOVE) ×1 IMPLANT
GLOVE BIOGEL PI INDICATOR 9 (GLOVE) ×2
GLOVE SURG 9.0 ORTHO LTXF (GLOVE) ×6 IMPLANT
GOWN STRL REUS TWL 2XL XL LVL4 (GOWN DISPOSABLE) ×3 IMPLANT
GOWN STRL REUS W/ TWL LRG LVL3 (GOWN DISPOSABLE) ×1 IMPLANT
GOWN STRL REUS W/TWL LRG LVL3 (GOWN DISPOSABLE) ×2
GUIDEWIRE THREADED 2.8MM (WIRE) ×9 IMPLANT
HOLDER FOLEY CATH W/STRAP (MISCELLANEOUS) IMPLANT
MAT BLUE FLOOR 46X72 FLO (MISCELLANEOUS) ×3 IMPLANT
NEEDLE HYPO 22GX1.5 SAFETY (NEEDLE) ×3 IMPLANT
NS IRRIG 1000ML POUR BTL (IV SOLUTION) IMPLANT
NS IRRIG 500ML POUR BTL (IV SOLUTION) ×3 IMPLANT
PACK HIP COMPR (MISCELLANEOUS) ×3 IMPLANT
SCREW CANN 16 THRD/105 7.3 (Screw) ×3 IMPLANT
SCREW CANN 16 THRD/85 7.3 (Screw) ×3 IMPLANT
SCREW CANN 32 THRD/90 7.3 (Screw) ×3 IMPLANT
SCREW CANN 32 THRD/95 7.3 (Screw) ×3 IMPLANT
STAPLER SKIN PROX 35W (STAPLE) ×3 IMPLANT
STRAP SAFETY BODY (MISCELLANEOUS) ×3 IMPLANT
SUT VIC AB 0 CT1 36 (SUTURE) ×3 IMPLANT
SUT VIC AB 2-0 CT2 27 (SUTURE) ×3 IMPLANT
SUT VICRYL 0 AB UR-6 (SUTURE) ×6 IMPLANT
SYR 30ML LL (SYRINGE) ×3 IMPLANT
Washer for 7.3 Synthes ×3 IMPLANT

## 2015-12-04 NOTE — Progress Notes (Signed)
[place] at [facility]                                                                                                                                                                                            Patient Demographics   Jimmy Hill, is a 75 y.o. male, DOB - [birth year], [id number]  Admit date - 12/03/2015   Admitting Physician Jimmy Coon, MD  Outpatient Primary MD for the patient is Jimmy Morgan, MD   LOS - 1  Subjective:  Patient seen in the PACU after surgery complains of pain Denies any chest pains or shortness of breath   Review of Systems:   CONSTITUTIONAL: No documented fever. No fatigue, weakness. No weight gain, no weight loss.  EYES: No blurry or double vision.  ENT: No tinnitus. No postnasal drip. No redness of the oropharynx.  RESPIRATORY: No cough, no wheeze, no hemoptysis. No dyspnea.  CARDIOVASCULAR: No chest pain. No orthopnea. No palpitations. No syncope.  GASTROINTESTINAL: No nausea, no vomiting or diarrhea. No abdominal pain. No melena or hematochezia.  GENITOURINARY: No dysuria or hematuria.  ENDOCRINE: No polyuria or nocturia. No heat or cold intolerance.  HEMATOLOGY: No anemia. No bruising. No bleeding.  INTEGUMENTARY: No rashes. No lesions.  MUSCULOSKELETAL: Hip pain positive NEUROLOGIC: No numbness, tingling, or ataxia. No seizure-type activity.  PSYCHIATRIC: No anxiety. No insomnia. No ADD.    Vitals:   Filed Vitals:   12/04/15 1037 12/04/15 1110 12/04/15 1336 12/04/15 1351  BP: 88/68  84/64 97/72  Pulse: 107  102 104  Temp: 97.8 F (36.6 C)  98.5 F (36.9 C)   TempSrc: Tympanic     Resp: 18  13 12   Height: 5\' 8"  (1.727 m)     Weight: 82.101 kg (181 lb)     SpO2: 92% 98% 98% 100%    Wt Readings from Last 3 Encounters:  12/04/15 82.101 kg (181 lb)  11/28/15 83.462 kg (184 lb)  05/29/15 90.719 kg (200 lb)     Intake/Output Summary (Last 24 hours) at 12/04/15 1412 Last data filed  at 12/04/15 1312  Gross per 24 hour  Intake   1000 ml  Output    675 ml  Net    325 ml    Physical Exam:   GENERAL: Pleasant-appearing in no apparent distress.  HEAD, EYES, EARS, NOSE AND THROAT: Atraumatic, normocephalic. Extraocular muscles are intact. Pupils equal and reactive to light. Sclerae anicteric. No conjunctival injection. No oro-pharyngeal erythema.  NECK: Supple. There is no jugular venous distention. No bruits, no lymphadenopathy, no thyromegaly.  HEART: Regular rate and rhythm,. No  murmurs, no rubs, no clicks.  LUNGS: Clear to auscultation bilaterally. No rales or rhonchi. No wheezes.  ABDOMEN: Soft, flat, nontender, nondistended. Has good bowel sounds. No hepatosplenomegaly appreciated.  EXTREMITIES: No evidence of any cyanosis, clubbing, or peripheral edema.  +2 pedal and radial pulses bilaterally.  NEUROLOGIC: The patient is alert, awake, and oriented x3 with no focal motor or sensory deficits appreciated bilaterally.  SKIN: Moist and warm with no rashes appreciated.  Psych: Not anxious, depressed LN: No inguinal LN enlargement    Antibiotics   Anti-infectives    Start     Dose/Rate Route Frequency Ordered Stop   12/04/15 1050  ceFAZolin (ANCEF) 2-4 GM/100ML-% IVPB    Comments:  Jimmy Hill: cabinet override      12/04/15 1050 12/04/15 2259   12/03/15 2203  ceFAZolin (ANCEF) IVPB 2g/100 mL premix     2 g 200 mL/hr over 30 Minutes Intravenous 30 min pre-op 12/03/15 2203 12/04/15 1149      Medications   Scheduled Meds: . [MAR Hold] aspirin EC  81 mg Oral Daily  . [MAR Hold] atorvastatin  80 mg Oral QHS  . [MAR Hold] brimonidine  1 drop Both Eyes BID   And  . [MAR Hold] timolol  1 drop Both Eyes BID  . [MAR Hold] buPROPion  300 mg Oral Daily  . [MAR Hold] carbidopa-levodopa  1 tablet Oral QID  . ceFAZolin      . [MAR Hold] clopidogrel  75 mg Oral Daily  . [MAR Hold] famotidine  20 mg Oral QHS  . [MAR Hold] insulin aspart  0-9 Units Subcutaneous Q6H   . [MAR Hold] mometasone-formoterol  2 puff Inhalation BID  . [MAR Hold] QUEtiapine  25 mg Oral QHS  . [MAR Hold] roflumilast  500 mcg Oral Daily  . [MAR Hold] tiotropium  18 mcg Inhalation Daily   Continuous Infusions: . sodium chloride     PRN Meds:.fentaNYL (SUBLIMAZE) injection, [MAR Hold] labetalol, [MAR Hold] methocarbamol **OR** [MAR Hold] methocarbamol (ROBAXIN)  IV, [MAR Hold]  morphine injection, ondansetron (ZOFRAN) IV, [MAR Hold] oxyCODONE, [MAR Hold] polyvinyl alcohol   Data Review:   Micro Results Recent Results (from the past 240 hour(s))  Surgical pcr screen     Status: None   Collection Time: 12/03/15 10:26 PM  Result Value Ref Range Status   MRSA, PCR NEGATIVE NEGATIVE Final   Staphylococcus aureus NEGATIVE NEGATIVE Final    Comment:        The Xpert SA Assay (FDA approved for NASAL specimens in patients over 79 years of age), is one component of a comprehensive surveillance program.  Test performance has been validated by [facility] for patients greater than or equal to 43 year old. It is not intended to diagnose infection nor to guide or monitor treatment.     Radiology Reports Dg Chest 1 View  12/03/2015  CLINICAL DATA:  Preop evaluation for left hip surgery. EXAM: CHEST 1 VIEW COMPARISON:  02/05/2015 FINDINGS: Normal heart size and vascularity. Mild hyperinflation without focal pneumonia, collapse or consolidation. No edema, effusion or pneumothorax. Trachea is midline. Atherosclerosis of the aorta. IMPRESSION: Stable chest exam.  No acute process. Thoracic aortic atherosclerosis Electronically Signed   By: Jimmy Mages.  Hill M.D.   On: 12/03/2015 20:48   Dg Femur Min 2 Views Left  12/03/2015  CLINICAL DATA:  Recent fall, left lower extremity pain. EXAM: LEFT FEMUR 2 VIEWS COMPARISON:  None available FINDINGS: Bones are osteopenic. Cortical buckling noted of the left proximal  femur subcapital neck compatible with an acute hip fracture. No associated  malalignment. Distal femur appears intact. Visualized bony pelvis unremarkable. Lower lumbar fusion hardware partially imaged. Peripheral atherosclerosis noted. IMPRESSION: Acute left hip subcapital femoral neck fracture. Osteopenia Peripheral atherosclerosis Electronically Signed   By: Jimmy Mages.  Hill M.D.   On: 12/03/2015 20:21     CBC  Recent Labs Lab 12/03/15 2039 12/04/15 0109  WBC 17.3* 14.7*  HGB 17.3 15.8  HCT 49.7 45.0  PLT 188 169  MCV 92.4 91.1  MCH 32.2 32.0  MCHC 34.8 35.1  RDW 13.7 14.1  LYMPHSABS 3.5  --   MONOABS 1.2*  --   EOSABS 0.3  --   BASOSABS 0.1  --     Chemistries   Recent Labs Lab 12/03/15 2111  NA 138  K 3.7  CL 103  CO2 28  GLUCOSE 161*  BUN 20  CREATININE 1.05  CALCIUM 8.8*   ------------------------------------------------------------------------------------------------------------------ estimated creatinine clearance is 63.5 mL/min (by C-G formula based on Cr of 1.05). ------------------------------------------------------------------------------------------------------------------ No results for input(s): HGBA1C in the last 72 hours. ------------------------------------------------------------------------------------------------------------------ No results for input(s): CHOL, HDL, LDLCALC, TRIG, CHOLHDL, LDLDIRECT in the last 72 hours. ------------------------------------------------------------------------------------------------------------------ No results for input(s): TSH, T4TOTAL, T3FREE, THYROIDAB in the last 72 hours.  Invalid input(s): FREET3 ------------------------------------------------------------------------------------------------------------------ No results for input(s): VITAMINB12, FOLATE, FERRITIN, TIBC, IRON, RETICCTPCT in the last 72 hours.  Coagulation profile  Recent Labs Lab 12/03/15 2039  INR 1.10    No results for input(s): DDIMER in the last 72 hours.  Cardiac Enzymes No results for input(s): CKMB,  TROPONINI, MYOGLOBIN in the last 168 hours.  Invalid input(s): CK ------------------------------------------------------------------------------------------------------------------ Invalid input(s): [place]   Patient is a 75 year old status post fall with a hip fracture 1.  Fracture of femoral neck, left (HCC) - status post repair  Ambulate as per orthopedics  2.  COPD (chronic obstructive pulmonary disease) ([place]) without evidence of acute exasperation- continue Spiriva and daliresp I will add Nebulizer prn  3.  Type 2 diabetes mellitus (HCC) - sliding scale insulin with corresponding glucose checks since due to unpredictable by mouth intake will continue to hold his insulin resumed tomorrow 4.  CAD (coronary artery disease) - seen by cardiology yesterday resume Plavix continue labetalol 5.  Sleep apnea - CPAP daily at bedtime 6. DVT prophylaxis per orthopedics recommends Lovenox    Code Status Orders        Start     Ordered   12/03/15 2057  Full code   Continuous     12/03/15 2059    Code Status History    Date Active Date Inactive Code Status Order ID Comments User Context   02/05/2015  4:14 PM 02/06/2015  2:04 PM Full Code [id number]  Theodoro Grist, MD Inpatient   12/10/2014 12:47 AM 12/10/2014  4:36 PM Full Code [id number]  Nicholes Mango, MD Inpatient           Consults Orthopedic   DVT Prophylaxis SCDs for now  Lab Results  Component Value Date   PLT 169 12/04/2015     Time Spent in minutes 59min    Dustin Flock M.D on 12/04/2015 at 2:12 PM  Between 7am to 6pm - Pager - [phone]  After 6pm go to [email or web address] - password [email or web address] [facility]   Office  [phone]

## 2015-12-04 NOTE — Anesthesia Postprocedure Evaluation (Signed)
Anesthesia Post Note  Patient: Jimmy Hill  Procedure(s) Performed: Procedure(s) (LRB): CANNULATED HIP PINNING (Left)  Patient location during evaluation: PACU Anesthesia Type: General Level of consciousness: awake and alert Pain management: pain level controlled Vital Signs Assessment: post-procedure vital signs reviewed and stable Respiratory status: spontaneous breathing, nonlabored ventilation, respiratory function stable and patient connected to nasal cannula oxygen Cardiovascular status: blood pressure returned to baseline and stable Postop Assessment: no signs of nausea or vomiting Anesthetic complications: no    Last Vitals:  Filed Vitals:   12/04/15 1436 12/04/15 1513  BP: 101/64 103/63  Pulse: 103 107  Temp: 36.8 C 36.8 C  Resp: 12 18    Last Pain:  Filed Vitals:   12/04/15 1540  PainSc: Asleep                 Martha Clan

## 2015-12-04 NOTE — Progress Notes (Signed)
Spoke with Mallory Shirk, SW for pace.

## 2015-12-04 NOTE — Progress Notes (Signed)
Spoke with pts Daughter, [name]. Informed her that surgery would be this morning.

## 2015-12-04 NOTE — Consult Note (Signed)
ORTHOPAEDIC CONSULTATION  REQUESTING PHYSICIAN: Dustin Flock, MD  Chief Complaint: Left hip pain  HPI: Jimmy Hill is a 75 y.o. male who complains of left hip pain after a fall.  Patient was brought to the [facility] emergency Department where he was diagnosed with a left femoral neck hip fracture. He was admitted to [facility] service and cleared for surgery. Orthopedics consult for management of his hip fracture. Patient has peripheral of her lower extremity neuropathy but denies any new neurologic deficits.  Past Medical History  Diagnosis Date  . Myocardial infarction ([place])   . Coronary artery disease   . Anginal pain ([place])   . Parkinson's disease ([place])   . Diabetes mellitus without complication ([place])   . Tinnitus   . Peripheral vascular disease ([place])   . Dysphagia   . Glaucoma   . Autonomic dysreflexia   . H/O orthostatic hypotension   . RLS (restless legs syndrome)   . Depression   . Chronic kidney disease     STAGE 3  . Strabismic amblyopia   . Smoker   . Polyneuropathy ([place])   . Arthritis   . DJD (degenerative joint disease)   . Back pain     CHRONIC LBP,STENOSIS  . COPD (chronic obstructive pulmonary disease) ([place])   . Allergic rhinitis   . Sleep apnea   . CHF (congestive heart failure) ([place])   . GERD (gastroesophageal reflux disease)   . Edema     FEET/ANKLES  . Diabetes [facility])    Past Surgical History  Procedure Laterality Date  . Coronary angioplasty      STENT  . Back surgery    . Eye surgery    . Appendectomy    . Ankle surgery    . Ctr     Social History   Social History  . Marital Status: Married    Spouse Name: N/A  . Number of Children: N/A  . Years of Education: N/A   Social History Main Topics  . Smoking status: Current Some Day Smoker -- 0.50 packs/day    Types: Cigarettes  . Smokeless tobacco: Never Used  . Alcohol Use: No  . Drug Use: No  . Sexual Activity: No   Other Topics Concern  . None   Social History  Narrative   Family History  Problem Relation Age of Onset  . Heart disease Father    No Known Allergies Prior to Admission medications   Medication Sig Start Date End Date Taking? Authorizing Provider  acetaminophen (TYLENOL) 325 MG tablet Take 650 mg by mouth every 6 (six) hours as needed for mild pain or headache.   Yes Historical Provider, MD  albuterol (PROVENTIL) (2.5 MG/3ML) 0.083% nebulizer solution Take 2.5 mg by nebulization every 4 (four) hours as needed for wheezing or shortness of breath.   Yes Historical Provider, MD  aspirin EC 81 MG tablet Take 81 mg by mouth daily.   Yes Historical Provider, MD  atorvastatin (LIPITOR) 80 MG tablet Take 80 mg by mouth at bedtime.   Yes Historical Provider, MD  brimonidine-timolol (COMBIGAN) 0.2-0.5 % ophthalmic solution Place 1 drop into both eyes 2 (two) times daily.   Yes Historical Provider, MD  buPROPion (WELLBUTRIN XL) 300 MG 24 hr tablet Take 300 mg by mouth daily.   Yes Historical Provider, MD  Calcium Carbonate-Vitamin D (CALCIUM 600+D) 600-400 MG-UNIT per tablet Take 1 tablet by mouth daily.   Yes Historical Provider, MD  carbidopa-levodopa (SINEMET IR) 25-250 MG tablet Take  1 tablet by mouth 4 (four) times daily.   Yes Historical Provider, MD  Carboxymethylcellul-Glycerin (OPTIVE) 0.5-0.9 % SOLN Apply 1-2 drops to eye 3 (three) times daily as needed (for dry eyes).   Yes Historical Provider, MD  cetirizine (ZYRTEC) 10 MG tablet Take 10 mg by mouth at bedtime.   Yes Historical Provider, MD  clopidogrel (PLAVIX) 75 MG tablet Take 75 mg by mouth daily.   Yes Historical Provider, MD  fluticasone (FLONASE) 50 MCG/ACT nasal spray Place 2 sprays into both nostrils daily as needed for rhinitis.   Yes Historical Provider, MD  Fluticasone-Salmeterol (ADVAIR) 500-50 MCG/DOSE AEPB Inhale into the lungs 2 (two) times daily.   Yes Historical Provider, MD  gabapentin (NEURONTIN) 800 MG tablet Take 600 mg by mouth. 1 AM 1 SUPPERTIME 2 HS   Yes  Historical Provider, MD  guaiFENesin (MUCINEX) 600 MG 12 hr tablet Take 600 mg by mouth daily.   Yes Historical Provider, MD  HYDROcodone-acetaminophen (NORCO/VICODIN) 5-325 MG per tablet Take 1 tablet by mouth every 6 (six) hours as needed for moderate pain.   Yes Historical Provider, MD  insulin glargine (LANTUS) 100 UNIT/ML injection Inject into the skin 2 (two) times daily. 45 UNITS AM 40 UNITS PM   Yes Historical Provider, MD  lidocaine (XYLOCAINE) 5 % ointment Apply 1 application topically 2 (two) times daily as needed. TO FEET   Yes Historical Provider, MD  metFORMIN (GLUCOPHAGE) 1000 MG tablet Take 1,000 mg by mouth.   Yes Historical Provider, MD  nicotine (NICODERM CQ - DOSED IN MG/24 HOURS) 21 mg/24hr patch Place 21 mg onto the skin daily. X 6 WEEKS   Yes Historical Provider, MD  nitroGLYCERIN (NITROSTAT) 0.4 MG SL tablet Place 0.4 mg under the tongue every 5 (five) minutes as needed for chest pain.   Yes Historical Provider, MD  QUEtiapine (SEROQUEL) 25 MG tablet Take 25 mg by mouth at bedtime. 04/05/15  Yes Historical Provider, MD  ranitidine (ZANTAC) 150 MG tablet Take 150 mg by mouth at bedtime.   Yes Historical Provider, MD  roflumilast (DALIRESP) 500 MCG TABS tablet Take 500 mcg by mouth daily.   Yes Historical Provider, MD  sitaGLIPtin (JANUVIA) 100 MG tablet Take 100 mg by mouth daily.   Yes Historical Provider, MD  tiotropium (SPIRIVA) 18 MCG inhalation capsule Place 18 mcg into inhaler and inhale daily. 2 PUFFS   Yes Historical Provider, MD  vitamin B-12 (CYANOCOBALAMIN) 1000 MCG tablet Take 1,000 mcg by mouth daily.   Yes Historical Provider, MD   Dg Chest 1 View  12/03/2015  CLINICAL DATA:  Preop evaluation for left hip surgery. EXAM: CHEST 1 VIEW COMPARISON:  02/05/2015 FINDINGS: Normal heart size and vascularity. Mild hyperinflation without focal pneumonia, collapse or consolidation. No edema, effusion or pneumothorax. Trachea is midline. Atherosclerosis of the aorta.  IMPRESSION: Stable chest exam.  No acute process. Thoracic aortic atherosclerosis Electronically Signed   By: Jerilynn Mages.  Shick M.D.   On: 12/03/2015 20:48   Dg Femur Min 2 Views Left  12/03/2015  CLINICAL DATA:  Recent fall, left lower extremity pain. EXAM: LEFT FEMUR 2 VIEWS COMPARISON:  None available FINDINGS: Bones are osteopenic. Cortical buckling noted of the left proximal femur subcapital neck compatible with an acute hip fracture. No associated malalignment. Distal femur appears intact. Visualized bony pelvis unremarkable. Lower lumbar fusion hardware partially imaged. Peripheral atherosclerosis noted. IMPRESSION: Acute left hip subcapital femoral neck fracture. Osteopenia Peripheral atherosclerosis Electronically Signed   By: Jerilynn Mages.  Shick M.D.  On: 12/03/2015 20:21    Positive ROS: All other systems have been reviewed and were otherwise negative with the exception of those mentioned in the HPI and as above.  Physical Exam: General: Alert, no acute distress  MUSCULOSKELETAL: Left lower: patient has minimal shortening and external rotation. Patient has equal sensation to light touch in both lower extremities. He has palpable pedal pulses. He is intact motor function although his range of motion in the left lower extremity is limited secondary to pain today.  Assessment: Impacted left femoral neck hip fracture  Plan: I cemented the patient the nature of his injury. I'm recommending percutaneous fixation for this fracture. I explained the details of the operation with the patient as well as the risks of surgery. [place] risks include but are not limited to infection, bleeding, nerve or blood vessel injury, malunion, nonunion, avascular necrosis, persistent hip pain and the need for further surgery including conversion to a hemi-or total hip arthroplasty. He understood the medical risks include but are not limited to DVT and pulmonary embolism,: Collection, stroke, pneumonia, respiratory failure and  death. He has been seen and cleared for surgery by Dr. Towanda Malkin from cardiology. Patient has a history of 5 cardiac stents and has cardiac risk factors for surgery. I reviewed the patient's laboratories and regular studies in preparation for this case. I answered all the patient's questions.    Thornton Park, MD    12/04/2015 11:23 AM

## 2015-12-04 NOTE — Transfer of Care (Signed)
Immediate Anesthesia Transfer of Care Note  Patient: Jimmy Hill  Procedure(s) Performed: Procedure(s): CANNULATED HIP PINNING (Left)  Patient Location: PACU  Anesthesia Type:General  Level of Consciousness: sedated  Airway & Oxygen Therapy: Patient Spontanous Breathing and Patient connected to face mask oxygen  Post-op Assessment: Report given to RN and Post -op Vital signs reviewed and stable  Post vital signs: Reviewed  Last Vitals:  Filed Vitals:   12/04/15 1037 12/04/15 1336  BP: 88/68 84/64  Pulse: 107   Temp: 36.6 C 36.9 C  Resp: 18 13    Last Pain:  Filed Vitals:   12/04/15 1337  PainSc: 8       Patients Stated Pain Goal: 2 ([id number])  Complications: No apparent anesthesia complications

## 2015-12-04 NOTE — Progress Notes (Signed)
Ancef 2 2gm sent to OR with patient Pt arrived with 02 sat 85-86%, Manning applied @2  and sats up to 92% after same

## 2015-12-04 NOTE — Progress Notes (Signed)
Patient ID: Jimmy Hill, male   DOB: [birth year], 75 y.o.   MRN: [id number] Pt was seen by me and records reviewed Known CAD with PCI- stent on plavix which may cause increased bleeding IMP Acceptable Mild/Mod surgical risk Full note to follow  Reeanna Acri/ Cardiology

## 2015-12-04 NOTE — Anesthesia Procedure Notes (Signed)
Procedure Name: LMA Insertion Performed by: Rebie Peale Pre-anesthesia Checklist: Patient identified, Patient being monitored, Timeout performed, Emergency Drugs available and Suction available Patient Re-evaluated:Patient Re-evaluated prior to inductionOxygen Delivery Method: Circle system utilized Preoxygenation: Pre-oxygenation with 100% oxygen Intubation Type: IV induction Ventilation: Mask ventilation without difficulty LMA: LMA inserted LMA Size: 4.0 Tube type: Oral Number of attempts: 1 Placement Confirmation: positive ETCO2 and breath sounds checked- equal and bilateral Tube secured with: Tape Dental Injury: Teeth and Oropharynx as per pre-operative assessment      

## 2015-12-04 NOTE — Progress Notes (Signed)
Mallory Shirk, SW for pace [phone]

## 2015-12-04 NOTE — Progress Notes (Signed)
Pt heart rate is elevated in 130's since admission to the floor. Dr Marcille Blanco notified. No new order received at this time

## 2015-12-04 NOTE — Progress Notes (Signed)
Pt refused to have ice packs applied to his left lower extremity and does not want to use CPAP during this hospitalization

## 2015-12-04 NOTE — NC FL2 (Signed)
[place] LEVEL OF CARE SCREENING TOOL     IDENTIFICATION  Patient Name: Jimmy Hill Birthdate: December 19, 1940 Sex: male Admission Date (Current Location): 12/03/2015  [facility] and [place] Number:  Jimmy Hill  ([id number] [facility]) Facility and Address:  [facility], [address], [place], [place] [address]      Provider Number: [id number]  Attending Physician Name and Address:  Dustin Flock, MD  Relative Name and Phone Number:       Current Level of Care: Hospital Recommended Level of Care: [place] Prior Approval Number:    Date Approved/Denied:   PASRR Number:  ([id number])  Discharge Plan: SNF    Current Diagnoses: Patient Active Problem List   Diagnosis Date Noted  . Fracture of femoral neck, left ([place]) 12/03/2015  . CAD (coronary artery disease) 12/03/2015  . Sleep apnea 12/03/2015  . Femoral neck fracture, left, closed, initial encounter 12/03/2015  . COPD (chronic obstructive pulmonary disease) ([place]) 02/05/2015  . Acute bronchitis 02/05/2015  . Type 2 diabetes mellitus ([place]) 02/05/2015  . Chest pain 12/09/2014    Orientation RESPIRATION BLADDER Height & Weight     Self, Time, Situation, Place  O2 (3 Liters Oxygen ) Continent Weight: 181 lb (82.101 kg) Height:  5\' 8"  (172.7 cm)  BEHAVIORAL SYMPTOMS/MOOD NEUROLOGICAL BOWEL NUTRITION STATUS   (none )  (none ) Continent Diet (Diet: Carb Modified )  AMBULATORY STATUS COMMUNICATION OF NEEDS Skin   Extensive Assist Verbally Surgical wounds (Incision: Left Hip. )                       Personal Care Assistance Level of Assistance  Bathing, Feeding, Dressing Bathing Assistance: Limited assistance Feeding assistance: Independent Dressing Assistance: Limited assistance     Functional Limitations Info  Sight, Hearing, Speech Sight Info: Adequate Hearing Info: Adequate Speech Info: Adequate    SPECIAL CARE FACTORS FREQUENCY  PT (By licensed PT), OT (By  licensed OT)     PT Frequency:  (5) OT Frequency:  (5)            Contractures      Additional Factors Info  Code Status, Allergies, Insulin Sliding Scale Code Status Info:  (Full Code. ) Allergies Info:  (No Known Allergies. )   Insulin Sliding Scale Info:  (NovoLog Insulin Injections 3 times per day. )       Current Medications (12/04/2015):  This is the current hospital active medication list Current Facility-Administered Medications  Medication Dose Route Frequency Provider Last Rate Last Dose  . 0.9 %  sodium chloride infusion  75 mL/hr Intravenous Continuous Thornton Park, MD 75 mL/hr at 12/04/15 1513 75 mL/hr at 12/04/15 1513  . acetaminophen (TYLENOL) tablet 650 mg  650 mg Oral Q6H PRN Thornton Park, MD       Or  . acetaminophen (TYLENOL) suppository 650 mg  650 mg Rectal Q6H PRN Thornton Park, MD      . alum & mag hydroxide-simeth (MAALOX/MYLANTA) 200-200-20 MG/5ML suspension 30 mL  30 mL Oral Q4H PRN Thornton Park, MD      . aspirin EC tablet 325 mg  325 mg Oral BID Thornton Park, MD      . atorvastatin (LIPITOR) tablet 80 mg  80 mg Oral QHS Lance Coon, MD   80 mg at 12/03/15 2302  . bisacodyl (DULCOLAX) suppository 10 mg  10 mg Rectal Daily PRN Thornton Park, MD      . brimonidine (ALPHAGAN) 0.2 % ophthalmic solution  1 drop  1 drop Both Eyes BID Lance Coon, MD   1 drop at 12/04/15 1014   And  . timolol (TIMOPTIC) 0.5 % ophthalmic solution 1 drop  1 drop Both Eyes BID Lance Coon, MD   1 drop at 12/04/15 1011  . buPROPion (WELLBUTRIN XL) 24 hr tablet 300 mg  300 mg Oral Daily Lance Coon, MD   300 mg at 12/04/15 1754  . carbidopa-levodopa (SINEMET IR) 25-250 MG per tablet immediate release 1 tablet  1 tablet Oral QID Lance Coon, MD   1 tablet at 12/04/15 0049  . ceFAZolin (ANCEF) 2-4 GM/100ML-% IVPB           . ceFAZolin (ANCEF) IVPB 2g/100 mL premix  2 g Intravenous Q6H Thornton Park, MD   2 g at 12/04/15 1747  . clopidogrel (PLAVIX) tablet 75 mg   75 mg Oral Daily Lance Coon, MD   75 mg at 12/04/15 1000  . docusate sodium (COLACE) capsule 100 mg  100 mg Oral BID Thornton Park, MD      . famotidine (PEPCID) tablet 20 mg  20 mg Oral QHS Lance Coon, MD   20 mg at 12/03/15 2302  . insulin aspart (novoLOG) injection 0-9 Units  0-9 Units Subcutaneous Q6H Lance Coon, MD   2 Units at 12/04/15 1754  . labetalol (NORMODYNE,TRANDATE) injection 10-20 mg  10-20 mg Intravenous Q2H PRN Harrie Foreman, MD   10 mg at 12/04/15 [id number]  . magnesium citrate solution 1 Bottle  1 Bottle Oral Once PRN Thornton Park, MD      . menthol-cetylpyridinium (CEPACOL) lozenge 3 mg  1 lozenge Oral PRN Thornton Park, MD       Or  . phenol (CHLORASEPTIC) mouth spray 1 spray  1 spray Mouth/Throat PRN Thornton Park, MD      . methocarbamol (ROBAXIN) tablet 500 mg  500 mg Oral Q6H PRN Thornton Park, MD       Or  . methocarbamol (ROBAXIN) 500 mg in dextrose 5 % 50 mL IVPB  500 mg Intravenous Q6H PRN Thornton Park, MD      . mometasone-formoterol [facility]) 200-5 MCG/ACT inhaler 2 puff  2 puff Inhalation BID Lance Coon, MD   2 puff at 12/04/15 1010  . morphine 2 MG/ML injection 2 mg  2 mg Intravenous Q2H PRN Thornton Park, MD      . ondansetron [facility]) tablet 4 mg  4 mg Oral Q6H PRN Thornton Park, MD       Or  . ondansetron [facility]) injection 4 mg  4 mg Intravenous Q6H PRN Thornton Park, MD      . oxyCODONE (Oxy IR/ROXICODONE) immediate release tablet 10-15 mg  10-15 mg Oral Q4H PRN Thornton Park, MD   10 mg at 12/04/15 1747  . polyethylene glycol (MIRALAX / GLYCOLAX) packet 17 g  17 g Oral Daily PRN Thornton Park, MD      . polyvinyl alcohol (LIQUIFILM TEARS) 1.4 % ophthalmic solution 1-2 drop  1-2 drop Both Eyes TID PRN Lance Coon, MD      . QUEtiapine (SEROQUEL) tablet 25 mg  25 mg Oral QHS Lance Coon, MD   25 mg at 12/03/15 2302  . roflumilast (DALIRESP) tablet 500 mcg  500 mcg Oral Daily Lance Coon, MD   500 mcg at 12/04/15 1754  .  senna (SENOKOT) tablet 8.6 mg  1 tablet Oral BID Thornton Park, MD      . tiotropium [facility]) inhalation capsule 18 mcg  18 mcg  Inhalation Daily Lance Coon, MD   18 mcg at 12/04/15 1008     Discharge Medications: Please see discharge summary for a list of discharge medications.  Relevant Imaging Results:  Relevant Lab Results:   Additional Information  (SSN: [id number])  Aahana Elza, Bronwen Betters, LCSW

## 2015-12-04 NOTE — Op Note (Signed)
12/03/2015 - 12/04/2015  5:19 PM  PATIENT:  Jimmy Hill    PRE-OPERATIVE DIAGNOSIS:  left hip fracture  POST-OPERATIVE DIAGNOSIS:  Same  PROCEDURE:  LEFT CANNULATED HIP PINNING  SURGEON:  Thornton Park, MD  ANESTHESIA:   General  PREOPERATIVE INDICATIONS:  KYLIL CHIARI is a  75 y.o. male who fell and was found to have a diagnosis of left impacted left femoral neck hip fracture.  I recommended surgical management for this injury.  I discussed the details of a percutaneous fixation surgery with him.  The risks benefits and alternatives were discussed with the patient preoperatively including but not limited to infection, bleeding, nerve or blood vessel injury, persistent hip pain,  malunion, nonunion, avascular necrosis, change in lower extremity rotation, leg length discrepancy, failure of the hardware and the need for revision surgery, including the potential for conversion to hemi or total hip arthroplasty. Medical risks include but are not limited to: DVT and pulmonary embolism, myocardial infarction, stroke, pneumonia, respiratory failure and death.  The patient understood and agreed with the plan for surgery.  Patient had the left hip marked with the word "yes" and my initials according the hospitals correct site of surgery protocol.  OPERATIVE IMPLANTS: 7.3 mm cannulated screws x 4  OPERATIVE PROCEDURE: The patient was brought to the operating room and patient underwent general anesthesia as he came in on Plavix.  He was placed supine on the fracture table. IV kefzol 2 g was administered. The left lower extremity was positioned in a leg holder, without any significant reduction maneuver other than mild internal rotation.  The well leg was placed in hemi-lithotomy position. The hip was prepped and draped in usual sterile fashion.  A time out was performed to verify the patient's name, date of birth, medical record number, correct site of surgery and correct surger to be performed.  The  timeout was also used to verify the patient had received antibiotics that all appropriate instruments, implants and radiographic studies were available in the room. Once all in attendance were in agreement case began..  A lateral incision was made in line with the proximal femur, distal to the greater trochanter, and 4 threaded guidewires were introduced Into the lateral cortex of the femur, across the fracture site and into the humeral head in a diamond configuration. The lengths of these guidepins were measured with a depth gauge. The lateral cortex was then opened with a cannulated drill, and then the cannulated screws were advanced into position and tightened by hand. Solid fixation was achieved.  One of the screws was placed with a washer. The guide pins were then removed and final C-arm images were taken of the fracture fixation. The fracture was well reduced and the hardware in good position.  The wound was irrigated copiously, and the deep and subcutaneous tissues were repaired with 0 and 2-0 Vicryl suture respectively and the skin was approximated with staples.  I was scrubbed and present the entire case and all sharp and instrument counts were correct at the conclusion of the case.  I spoke with the patient's stepdaughter by phone from the PACU to let her know the case was completed without complication and the patient was stable in the recovery room.    Timoteo Gaul, MD

## 2015-12-04 NOTE — Progress Notes (Signed)
Subjective:  POST-OP CHECK:  Patient seen in his hospital room following surgery. Patient reports left hip pain as mild.  He is no other complaints. Patient is resting comfortably.  Objective:   VITALS:   Filed Vitals:   12/04/15 1436 12/04/15 1513 12/04/15 1549 12/04/15 1652  BP: 101/64 103/63 93/42 98/47   Pulse: 103 107 93 94  Temp: 98.3 F (36.8 C) 98.3 F (36.8 C) 98.9 F (37.2 C) 97.4 F (36.3 C)  TempSrc:  Axillary Axillary Oral  Resp: 12 18 18 18   Height:      Weight:      SpO2: 95% 96% 96% 96%    PHYSICAL EXAM:  Left lower extremity: Patient's sensation is at baseline. 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 Neurovascular intact Intact pulses distally Dorsiflexion/Plantar flexion intact Incision: dressing C/D/I No cellulitis present Compartment soft  LABS  Results for orders placed or performed during the hospital encounter of 12/03/15 (from the past 24 hour(s))  CBC with Differential     Status: Abnormal   Collection Time: 12/03/15  8:39 PM  Result Value Ref Range   WBC 17.3 (H) 3.8 - 10.6 K/uL   RBC 5.38 4.40 - 5.90 MIL/uL   Hemoglobin 17.3 13.0 - 18.0 g/dL   HCT 49.7 40.0 - 52.0 %   MCV 92.4 80.0 - 100.0 fL   MCH 32.2 26.0 - 34.0 pg   MCHC 34.8 32.0 - 36.0 g/dL   RDW 13.7 11.5 - 14.5 %   Platelets 188 150 - 440 K/uL   Neutrophils Relative % 70 %   Neutro Abs 12.2 (H) 1.4 - 6.5 K/uL   Lymphocytes Relative 20 %   Lymphs Abs 3.5 1.0 - 3.6 K/uL   Monocytes Relative 7 %   Monocytes Absolute 1.2 (H) 0.2 - 1.0 K/uL   Eosinophils Relative 2 %   Eosinophils Absolute 0.3 0 - 0.7 K/uL   Basophils Relative 1 %   Basophils Absolute 0.1 0 - 0.1 K/uL  Protime-INR     Status: None   Collection Time: 12/03/15  8:39 PM  Result Value Ref Range   Prothrombin Time 14.4 11.4 - 15.0 seconds   INR [id number]   Basic metabolic panel     Status: Abnormal   Collection Time: 12/03/15  9:11 PM  Result Value Ref Range   Sodium 138 135 - 145  mmol/L   Potassium 3.7 3.5 - 5.1 mmol/L   Chloride 103 101 - 111 mmol/L   CO2 28 22 - 32 mmol/L   Glucose, Bld 161 (H) 65 - 99 mg/dL   BUN 20 6 - 20 mg/dL   Creatinine, Ser 1.05 0.61 - 1.24 mg/dL   Calcium 8.8 (L) 8.9 - 10.3 mg/dL   GFR calc non Af Amer >60 >60 mL/min   GFR calc Af Amer >60 >60 mL/min   Anion gap 7 5 - 15  APTT     Status: None   Collection Time: 12/03/15  9:11 PM  Result Value Ref Range   aPTT 33 24 - 36 seconds  Albumin     Status: None   Collection Time: 12/03/15  9:11 PM  Result Value Ref Range   Albumin 3.6 3.5 - 5.0 g/dL  Surgical pcr screen     Status: None   Collection Time: 12/03/15 10:26 PM  Result Value Ref Range   MRSA, PCR NEGATIVE NEGATIVE   Staphylococcus aureus NEGATIVE NEGATIVE  Glucose, capillary     Status: Abnormal  Collection Time: 12/04/15 12:29 AM  Result Value Ref Range   Glucose-Capillary 227 (H) 65 - 99 mg/dL  CBC     Status: Abnormal   Collection Time: 12/04/15  1:09 AM  Result Value Ref Range   WBC 14.7 (H) 3.8 - 10.6 K/uL   RBC 4.93 4.40 - 5.90 MIL/uL   Hemoglobin 15.8 13.0 - 18.0 g/dL   HCT 45.0 40.0 - 52.0 %   MCV 91.1 80.0 - 100.0 fL   MCH 32.0 26.0 - 34.0 pg   MCHC 35.1 32.0 - 36.0 g/dL   RDW 14.1 11.5 - 14.5 %   Platelets 169 150 - 440 K/uL  Type and screen If not already done in ED     Status: None   Collection Time: 12/04/15  1:09 AM  Result Value Ref Range   ABO/RH(D) A POS    Antibody Screen NEG    Sample Expiration 12/07/2015   Glucose, capillary     Status: Abnormal   Collection Time: 12/04/15  6:06 AM  Result Value Ref Range   Glucose-Capillary 196 (H) 65 - 99 mg/dL  Glucose, capillary     Status: Abnormal   Collection Time: 12/04/15 11:01 AM  Result Value Ref Range   Glucose-Capillary 132 (H) 65 - 99 mg/dL  Glucose, capillary     Status: Abnormal   Collection Time: 12/04/15  2:29 PM  Result Value Ref Range   Glucose-Capillary 123 (H) 65 - 99 mg/dL    Dg Chest 1 View  12/03/2015  CLINICAL DATA:   Preop evaluation for left hip surgery. EXAM: CHEST 1 VIEW COMPARISON:  02/05/2015 FINDINGS: Normal heart size and vascularity. Mild hyperinflation without focal pneumonia, collapse or consolidation. No edema, effusion or pneumothorax. Trachea is midline. Atherosclerosis of the aorta. IMPRESSION: Stable chest exam.  No acute process. Thoracic aortic atherosclerosis Electronically Signed   By: Jerilynn Mages.  Shick M.D.   On: 12/03/2015 20:48   Dg C-arm 1-60 Min  12/04/2015  CLINICAL DATA:  Patient status post ORIF left hip fracture. EXAM: LEFT FEMUR PORTABLE 2 VIEWS; DG C-ARM 61-120 MIN COMPARISON:  Radiograph 12/03/2015 FINDINGS: Two intraoperative fluoroscopic images were submitted demonstrating ORIF left femoral subcapital fracture. Improved anatomic alignment. IMPRESSION: Patient status post ORIF left femoral neck fracture. Electronically Signed   By: Lovey Newcomer M.D.   On: 12/04/2015 14:39   Dg Hip Port Unilat With Pelvis 1v Left  12/04/2015  CLINICAL DATA:  75 year old male post hip pinning. Subsequent encounter. EXAM: DG HIP (WITH OR WITHOUT PELVIS) 1V PORT LEFT COMPARISON:  Intraoperative 12/04/2015 exam. Preoperative 11/23/2015 exam. FINDINGS: Four pins placed across left subcapital femoral fracture. On the frontal view, pins appear contained within confines of the femoral head. Lateral view limited. Intraoperative lateral view reveals pins are located within the confines of the femoral head. Post remote lumbar spine surgery. IMPRESSION: Post open reduction left femoral neck fracture as noted above. Electronically Signed   By: Genia Del M.D.   On: 12/04/2015 15:11   Dg Femur Min 2 Views Left  12/03/2015  CLINICAL DATA:  Recent fall, left lower extremity pain. EXAM: LEFT FEMUR 2 VIEWS COMPARISON:  None available FINDINGS: Bones are osteopenic. Cortical buckling noted of the left proximal femur subcapital neck compatible with an acute hip fracture. No associated malalignment. Distal femur appears intact.  Visualized bony pelvis unremarkable. Lower lumbar fusion hardware partially imaged. Peripheral atherosclerosis noted. IMPRESSION: Acute left hip subcapital femoral neck fracture. Osteopenia Peripheral atherosclerosis Electronically Signed   By: Jerilynn Mages.  Shick M.D.   On: 12/03/2015 20:21   Dg Femur Port Min 2 Views Left  12/04/2015  CLINICAL DATA:  Patient status post ORIF left hip fracture. EXAM: LEFT FEMUR PORTABLE 2 VIEWS; DG C-ARM 61-120 MIN COMPARISON:  Radiograph 12/03/2015 FINDINGS: Two intraoperative fluoroscopic images were submitted demonstrating ORIF left femoral subcapital fracture. Improved anatomic alignment. IMPRESSION: Patient status post ORIF left femoral neck fracture. Electronically Signed   By: Lovey Newcomer M.D.   On: 12/04/2015 14:39    Assessment/Plan: Day of Surgery   Principal Problem:   Fracture of femoral neck, left (HCC) Active Problems:   COPD (chronic obstructive pulmonary disease) (HCC)   Type 2 diabetes mellitus (HCC)   CAD (coronary artery disease)   Sleep apnea   Femoral neck fracture, left, closed, initial encounter  Patient is stable postop. He'll receive 24 hours postop antibiotics. His Foley catheter will be removed tomorrow. Continue current pain management. Patient began physical occupational therapy tomorrow. Social work consult was then placed for skilled nursing facility placement following discharge. Labs will be rechecked in the morning.    Thornton Park , MD 12/04/2015, 5:27 PM

## 2015-12-04 NOTE — Anesthesia Preprocedure Evaluation (Addendum)
Anesthesia Evaluation  Patient identified by MRN, date of birth, ID band Patient awake    Reviewed: Allergy & Precautions, H&P , NPO status , Patient's Chart, lab work & pertinent test results, reviewed documented beta blocker date and time   History of Anesthesia Complications Negative for: history of anesthetic complications  Airway Mallampati: II  TM Distance: >3 FB Neck ROM: full    Dental no notable dental hx. (+) Edentulous Upper, Edentulous Lower   Pulmonary neg shortness of breath, sleep apnea , COPD, neg recent URI, Current Smoker,    breath sounds clear to auscultation + decreased breath sounds      Cardiovascular Exercise Tolerance: Good (-) angina+ CAD, + Past MI, + Cardiac Stents, + Peripheral Vascular Disease and +CHF  (-) CABG (-) dysrhythmias (-) Valvular Problems/Murmurs Rhythm:regular Rate:Normal     Neuro/Psych neg Seizures PSYCHIATRIC DISORDERS (Depression)  Neuromuscular disease (Polyneuropathy) negative psych ROS   GI/Hepatic Neg liver ROS, GERD  ,  Endo/Other  diabetes  Renal/GU CRFRenal disease  negative genitourinary   Musculoskeletal   Abdominal   Peds  Hematology negative hematology ROS (+)   Anesthesia Other Findings Past Medical History:   Myocardial infarction ([place])                                  Coronary artery disease                                      Anginal pain (HCC)                                           Parkinson's disease ([place])                                    Diabetes mellitus without complication (HCC)                 Tinnitus                                                     Peripheral vascular disease (HCC)                            Dysphagia                                                    Glaucoma                                                     Autonomic dysreflexia  H/O orthostatic hypotension                                   RLS (restless legs syndrome)                                 Depression                                                   Chronic kidney disease                                         Comment:STAGE 3   Strabismic amblyopia                                         Smoker                                                       Polyneuropathy (HCC)                                         Arthritis                                                    DJD (degenerative joint disease)                             Back pain                                                      Comment:CHRONIC LBP,STENOSIS   COPD (chronic obstructive pulmonary disease) (*              Allergic rhinitis                                            Sleep apnea                                                  CHF (congestive heart failure) (HCC)                         GERD (gastroesophageal reflux  disease)                       Edema                                                          Comment:FEET/ANKLES   Diabetes ([place])                                               Reproductive/Obstetrics negative OB ROS                            Anesthesia Physical Anesthesia Plan  ASA: III  Anesthesia Plan: General   Post-op Pain Management:    Induction:   Airway Management Planned:   Additional Equipment:   Intra-op Plan:   Post-operative Plan:   Informed Consent: I have reviewed the patients History and Physical, chart, labs and discussed the procedure including the risks, benefits and alternatives for the proposed anesthesia with the patient or authorized representative who has indicated his/her understanding and acceptance.   Dental Advisory Given  Plan Discussed with: Anesthesiologist, CRNA and Surgeon  Anesthesia Plan Comments:        Anesthesia Quick Evaluation

## 2015-12-05 ENCOUNTER — Inpatient Hospital Stay: Payer: Medicare (Managed Care)

## 2015-12-05 ENCOUNTER — Encounter: Payer: Self-pay | Admitting: Orthopedic Surgery

## 2015-12-05 LAB — URINALYSIS COMPLETE WITH MICROSCOPIC (ARMC ONLY)
BILIRUBIN URINE: NEGATIVE
GLUCOSE, UA: NEGATIVE mg/dL
Hgb urine dipstick: NEGATIVE
KETONES UR: NEGATIVE mg/dL
NITRITE: NEGATIVE
PH: 5 (ref 5.0–8.0)
Protein, ur: NEGATIVE mg/dL
Specific Gravity, Urine: 1.019 (ref 1.005–1.030)
Squamous Epithelial / LPF: NONE SEEN

## 2015-12-05 LAB — BASIC METABOLIC PANEL
Anion gap: 5 (ref 5–15)
BUN: 13 mg/dL (ref 6–20)
CHLORIDE: 106 mmol/L (ref 101–111)
CO2: 24 mmol/L (ref 22–32)
Calcium: 8 mg/dL — ABNORMAL LOW (ref 8.9–10.3)
Creatinine, Ser: 1 mg/dL (ref 0.61–1.24)
GFR calc Af Amer: >60 mL/min (ref >60)
GFR calc non Af Amer: >60 mL/min (ref >60)
GLUCOSE: 135 mg/dL — AB (ref 65–99)
POTASSIUM: 3.9 mmol/L (ref 3.5–5.1)
Sodium: 135 mmol/L (ref 135–145)

## 2015-12-05 LAB — GLUCOSE, CAPILLARY
GLUCOSE-CAPILLARY: 155 mg/dL — AB (ref 65–99)
GLUCOSE-CAPILLARY: 157 mg/dL — AB (ref 65–99)
Glucose-Capillary: 132 mg/dL — ABNORMAL HIGH (ref 65–99)
Glucose-Capillary: 143 mg/dL — ABNORMAL HIGH (ref 65–99)
Glucose-Capillary: 196 mg/dL — ABNORMAL HIGH (ref 65–99)

## 2015-12-05 LAB — URINE CULTURE: Culture: NO GROWTH

## 2015-12-05 LAB — CBC
HCT: 40.2 % (ref 40.0–52.0)
HEMOGLOBIN: 14.2 g/dL (ref 13.0–18.0)
MCH: 32.3 pg (ref 26.0–34.0)
MCHC: 35.2 g/dL (ref 32.0–36.0)
MCV: 91.6 fL (ref 80.0–100.0)
Platelets: 140 10*3/uL — ABNORMAL LOW (ref 150–440)
RBC: 4.39 MIL/uL — ABNORMAL LOW (ref 4.40–5.90)
RDW: 13.8 % (ref 11.5–14.5)
WBC: 12.9 10*3/uL — ABNORMAL HIGH (ref 3.8–10.6)

## 2015-12-05 MED ORDER — ENOXAPARIN SODIUM 40 MG/0.4ML ~~LOC~~ SOLN: 40.0000 mg | SUBCUTANEOUS | Status: DC

## 2015-12-05 NOTE — Consult Note (Signed)
Reason for Consult: Preoperative clearance known coronary disease Referring Physician: Dr. Mack Guise orthopedics, hospitalist  Jimmy Hill is an 75 y.o. male.  HPI: 75 year old male known disease multiple medical problems diabetes hyperlipidemia chronic disease PCI and stent RCA value ago followed at [facility] now status post fall needs preoperative clearance for hip surgery. Patient denies any recent chest pain angina or shortness of breath denies unusual he has COPD with chronic dyspnea and uses inhalers. Patient has Parkinson's disease and has had trouble with his balance he said he tripped he didn't get these E well denies syncope and fell and injured himself he's had falls before. Patient denies previous orthopedic problems but has been relatively stable from a cardiac standpoint on aspirin Plavix and statins. Patient feels reasonably well and now is ready for orthopedic procedure.  Past Medical History  Diagnosis Date  . Myocardial infarction ([place])   . Coronary artery disease   . Anginal pain ([place])   . Parkinson's disease ([place])   . Diabetes mellitus without complication ([place])   . Tinnitus   . Peripheral vascular disease ([place])   . Dysphagia   . Glaucoma   . Autonomic dysreflexia   . H/O orthostatic hypotension   . RLS (restless legs syndrome)   . Depression   . Chronic kidney disease     STAGE 3  . Strabismic amblyopia   . Smoker   . Polyneuropathy ([place])   . Arthritis   . DJD (degenerative joint disease)   . Back pain     CHRONIC LBP,STENOSIS  . COPD (chronic obstructive pulmonary disease) ([place])   . Allergic rhinitis   . Sleep apnea   . CHF (congestive heart failure) ([place])   . GERD (gastroesophageal reflux disease)   . Edema     FEET/ANKLES  . Diabetes [facility])     Past Surgical History  Procedure Laterality Date  . Coronary angioplasty      STENT  . Back surgery    . Eye surgery    . Appendectomy    . Ankle surgery    . Ctr      Family History  Problem Relation  Age of Onset  . Heart disease Father     Social History:  reports that he has been smoking Cigarettes.  He has been smoking about 0.50 packs per day. He has never used smokeless tobacco. He reports that he does not drink alcohol or use illicit drugs.  Allergies: No Known Allergies  Medications: I have reviewed the patient's current medications.  Results for orders placed or performed during the hospital encounter of 12/03/15 (from the past 48 hour(s))  CBC with Differential     Status: Abnormal   Collection Time: 12/03/15  8:39 PM  Result Value Ref Range   WBC 17.3 (H) 3.8 - 10.6 K/uL   RBC 5.38 4.40 - 5.90 MIL/uL   Hemoglobin 17.3 13.0 - 18.0 g/dL   HCT 49.7 40.0 - 52.0 %   MCV 92.4 80.0 - 100.0 fL   MCH 32.2 26.0 - 34.0 pg   MCHC 34.8 32.0 - 36.0 g/dL   RDW 13.7 11.5 - 14.5 %   Platelets 188 150 - 440 K/uL   Neutrophils Relative % 70 %   Neutro Abs 12.2 (H) 1.4 - 6.5 K/uL   Lymphocytes Relative 20 %   Lymphs Abs 3.5 1.0 - 3.6 K/uL   Monocytes Relative 7 %   Monocytes Absolute 1.2 (H) 0.2 - 1.0 K/uL   Eosinophils Relative 2 %  Eosinophils Absolute 0.3 0 - 0.7 K/uL   Basophils Relative 1 %   Basophils Absolute 0.1 0 - 0.1 K/uL  Protime-INR     Status: None   Collection Time: 12/03/15  8:39 PM  Result Value Ref Range   Prothrombin Time 14.4 11.4 - 15.0 seconds   INR 2.91   Basic metabolic panel     Status: Abnormal   Collection Time: 12/03/15  9:11 PM  Result Value Ref Range   Sodium 138 135 - 145 mmol/L   Potassium 3.7 3.5 - 5.1 mmol/L   Chloride 103 101 - 111 mmol/L   CO2 28 22 - 32 mmol/L   Glucose, Bld 161 (H) 65 - 99 mg/dL   BUN 20 6 - 20 mg/dL   Creatinine, Ser 1.05 0.61 - 1.24 mg/dL   Calcium 8.8 (L) 8.9 - 10.3 mg/dL   GFR calc non Af Amer >60 >60 mL/min   GFR calc Af Amer >60 >60 mL/min    Comment: (NOTE) The eGFR has been calculated using the CKD EPI equation. This calculation has not been validated in all clinical situations. eGFR's persistently <60  mL/min signify possible Chronic Kidney Disease.    Anion gap 7 5 - 15  APTT     Status: None   Collection Time: 12/03/15  9:11 PM  Result Value Ref Range   aPTT 33 24 - 36 seconds  Albumin     Status: None   Collection Time: 12/03/15  9:11 PM  Result Value Ref Range   Albumin 3.6 3.5 - 5.0 g/dL  Surgical pcr screen     Status: None   Collection Time: 12/03/15 10:26 PM  Result Value Ref Range   MRSA, PCR NEGATIVE NEGATIVE   Staphylococcus aureus NEGATIVE NEGATIVE    Comment:        The Xpert SA Assay (FDA approved for NASAL specimens in patients over 41 years of age), is one component of a comprehensive surveillance program.  Test performance has been validated by [facility] for patients greater than or equal to 72 year old. It is not intended to diagnose infection nor to guide or monitor treatment.   Glucose, capillary     Status: Abnormal   Collection Time: 12/04/15 12:29 AM  Result Value Ref Range   Glucose-Capillary 227 (H) 65 - 99 mg/dL  CBC     Status: Abnormal   Collection Time: 12/04/15  1:09 AM  Result Value Ref Range   WBC 14.7 (H) 3.8 - 10.6 K/uL   RBC 4.93 4.40 - 5.90 MIL/uL   Hemoglobin 15.8 13.0 - 18.0 g/dL   HCT 45.0 40.0 - 52.0 %   MCV 91.1 80.0 - 100.0 fL   MCH 32.0 26.0 - 34.0 pg   MCHC 35.1 32.0 - 36.0 g/dL   RDW 14.1 11.5 - 14.5 %   Platelets 169 150 - 440 K/uL  Type and screen If not already done in ED     Status: None   Collection Time: 12/04/15  1:09 AM  Result Value Ref Range   ABO/RH(D) A POS    Antibody Screen NEG    Sample Expiration 12/07/2015   Glucose, capillary     Status: Abnormal   Collection Time: 12/04/15  6:06 AM  Result Value Ref Range   Glucose-Capillary 196 (H) 65 - 99 mg/dL  Glucose, capillary     Status: Abnormal   Collection Time: 12/04/15 11:01 AM  Result Value Ref Range   Glucose-Capillary 132 (H) 65 -  99 mg/dL  Glucose, capillary     Status: Abnormal   Collection Time: 12/04/15  2:29 PM  Result Value Ref Range    Glucose-Capillary 123 (H) 65 - 99 mg/dL  Glucose, capillary     Status: Abnormal   Collection Time: 12/04/15  5:41 PM  Result Value Ref Range   Glucose-Capillary 146 (H) 65 - 99 mg/dL  Glucose, capillary     Status: Abnormal   Collection Time: 12/05/15 12:07 AM  Result Value Ref Range   Glucose-Capillary 157 (H) 65 - 99 mg/dL  Glucose, capillary     Status: Abnormal   Collection Time: 12/05/15  6:08 AM  Result Value Ref Range   Glucose-Capillary 143 (H) 65 - 99 mg/dL    Dg Chest 1 View  12/03/2015  CLINICAL DATA:  Preop evaluation for left hip surgery. EXAM: CHEST 1 VIEW COMPARISON:  02/05/2015 FINDINGS: Normal heart size and vascularity. Mild hyperinflation without focal pneumonia, collapse or consolidation. No edema, effusion or pneumothorax. Trachea is midline. Atherosclerosis of the aorta. IMPRESSION: Stable chest exam.  No acute process. Thoracic aortic atherosclerosis Electronically Signed   By: Jerilynn Mages.  Shick M.D.   On: 12/03/2015 20:48   Dg C-arm 1-60 Min  12/04/2015  CLINICAL DATA:  Patient status post ORIF left hip fracture. EXAM: LEFT FEMUR PORTABLE 2 VIEWS; DG C-ARM 61-120 MIN COMPARISON:  Radiograph 12/03/2015 FINDINGS: Two intraoperative fluoroscopic images were submitted demonstrating ORIF left femoral subcapital fracture. Improved anatomic alignment. IMPRESSION: Patient status post ORIF left femoral neck fracture. Electronically Signed   By: Lovey Newcomer M.D.   On: 12/04/2015 14:39   Dg Hip Port Unilat With Pelvis 1v Left  12/04/2015  CLINICAL DATA:  75 year old male post hip pinning. Subsequent encounter. EXAM: DG HIP (WITH OR WITHOUT PELVIS) 1V PORT LEFT COMPARISON:  Intraoperative 12/04/2015 exam. Preoperative 11/23/2015 exam. FINDINGS: Four pins placed across left subcapital femoral fracture. On the frontal view, pins appear contained within confines of the femoral head. Lateral view limited. Intraoperative lateral view reveals pins are located within the confines of the  femoral head. Post remote lumbar spine surgery. IMPRESSION: Post open reduction left femoral neck fracture as noted above. Electronically Signed   By: Genia Del M.D.   On: 12/04/2015 15:11   Dg Femur Min 2 Views Left  12/03/2015  CLINICAL DATA:  Recent fall, left lower extremity pain. EXAM: LEFT FEMUR 2 VIEWS COMPARISON:  None available FINDINGS: Bones are osteopenic. Cortical buckling noted of the left proximal femur subcapital neck compatible with an acute hip fracture. No associated malalignment. Distal femur appears intact. Visualized bony pelvis unremarkable. Lower lumbar fusion hardware partially imaged. Peripheral atherosclerosis noted. IMPRESSION: Acute left hip subcapital femoral neck fracture. Osteopenia Peripheral atherosclerosis Electronically Signed   By: Jerilynn Mages.  Shick M.D.   On: 12/03/2015 20:21   Dg Femur Port Min 2 Views Left  12/04/2015  CLINICAL DATA:  Patient status post ORIF left hip fracture. EXAM: LEFT FEMUR PORTABLE 2 VIEWS; DG C-ARM 61-120 MIN COMPARISON:  Radiograph 12/03/2015 FINDINGS: Two intraoperative fluoroscopic images were submitted demonstrating ORIF left femoral subcapital fracture. Improved anatomic alignment. IMPRESSION: Patient status post ORIF left femoral neck fracture. Electronically Signed   By: Lovey Newcomer M.D.   On: 12/04/2015 14:39    Review of Systems  Constitutional: Positive for malaise/fatigue.  HENT: Positive for hearing loss.   Eyes: Negative.   Respiratory: Negative.   Cardiovascular: Negative.   Gastrointestinal: Negative.   Genitourinary: Negative.   Musculoskeletal: Positive for joint pain.  Skin: Negative.   Endo/Heme/Allergies: Negative.   Psychiatric/Behavioral: Negative.    Blood pressure 98/55, pulse 92, temperature 99.2 F (37.3 C), temperature source Oral, resp. rate 20, height '5\' 8"'  (1.727 m), weight 82.101 kg (181 lb), SpO2 96 %. Physical Exam  Constitutional: He is oriented to person, place, and time. He appears well-developed  and well-nourished.  HENT:  Head: Normocephalic and atraumatic.  Eyes: Conjunctivae and EOM are normal. Pupils are equal, round, and reactive to light.  Neck: Normal range of motion. Neck supple.  Cardiovascular: Normal rate and regular rhythm.   Respiratory: Effort normal and breath sounds normal.  GI: Soft. Bowel sounds are normal.  Musculoskeletal: Normal range of motion.  Neurological: He is alert and oriented to person, place, and time. He has normal reflexes.  Skin: Skin is warm and dry.  Psychiatric: He has a normal mood and affect.    Assessment/Plan: Preop clearance for orthopedic Coronary disease Stable angina COPD exacerbation Diabetes type 2 uncomplicated Acute bronchitis Tobacco abuse GERD Parkinson's disease Hyperlipidemia Smoking . Plan Patient appears to be an acceptable surgical risk mild to moderate Agree with proceeding with procedure Continue diabetes management and control Agree with inhalers as necessary for COPD CPAP weight loss since study Coronary artery disease known PCI and stent to RCA Continue Protonix therapy for GERD control Agree with Parkinson's therapy be continued with Sinemet Hypertension control with labetalol metoprolol Continue inhalers as necessary for COPD and shortness of breath symptoms Maintain Plavix since PCI and stent of RCA/ASA Have the patient follow-up with cardiology one to 2 weeks Advised to refrain from tobacco abuse  CALLWOOD,DWAYNE D. 12/05/2015, 8:41 AM

## 2015-12-05 NOTE — Progress Notes (Signed)
Subjective:  Postoperative day 1 and is status post percutaneous fixation of left femoral neck hip fracture. Patient reports left hip pain as mild to moderate.  Patient is sitting up in bed eating lunch. His nurse states that he has been drowsy since surgery. Patient reports sitting on the bedside to perform physical therapy exercises. Hip motion didn't cause pain. This was confirmed when I spoke with the physical therapist personally.  Objective:   VITALS:   Filed Vitals:   12/04/15 2338 12/05/15 0348 12/05/15 1200 12/05/15 1209  BP: 90/58 98/55  93/53  Pulse: 89 92  93  Temp: 97.3 F (36.3 C) 99.2 F (37.3 C) 100.9 F (38.3 C) 100.9 F (38.3 C)  TempSrc: Oral Oral Axillary Axillary  Resp: 16 20    Height:      Weight:      SpO2: 94% 96%  94%    PHYSICAL EXAM:  Left lower extremity:  Dressings clean dry and intact. Patient's thigh and leg compartments are soft and compressible. He has baseline sensation in the bilateral lower extremities which is diminished secondary to  peripheral neuropathy. Patient has intact motor function in bilateral lower extremities and palpable pedal pulses.   LABS  Results for orders placed or performed during the hospital encounter of 12/03/15 (from the past 24 hour(s))  Glucose, capillary     Status: Abnormal   Collection Time: 12/04/15  2:29 PM  Result Value Ref Range   Glucose-Capillary 123 (H) 65 - 99 mg/dL  Glucose, capillary     Status: Abnormal   Collection Time: 12/04/15  5:41 PM  Result Value Ref Range   Glucose-Capillary 146 (H) 65 - 99 mg/dL  Glucose, capillary     Status: Abnormal   Collection Time: 12/05/15 12:07 AM  Result Value Ref Range   Glucose-Capillary 157 (H) 65 - 99 mg/dL  Glucose, capillary     Status: Abnormal   Collection Time: 12/05/15  6:08 AM  Result Value Ref Range   Glucose-Capillary 143 (H) 65 - 99 mg/dL  CBC     Status: Abnormal   Collection Time: 12/05/15  9:57 AM  Result Value Ref Range   WBC 12.9 (H)  3.8 - 10.6 K/uL   RBC 4.39 (L) 4.40 - 5.90 MIL/uL   Hemoglobin 14.2 13.0 - 18.0 g/dL   HCT 40.2 40.0 - 52.0 %   MCV 91.6 80.0 - 100.0 fL   MCH 32.3 26.0 - 34.0 pg   MCHC 35.2 32.0 - 36.0 g/dL   RDW 13.8 11.5 - 14.5 %   Platelets 140 (L) 150 - 440 K/uL  Basic metabolic panel     Status: Abnormal   Collection Time: 12/05/15  9:57 AM  Result Value Ref Range   Sodium 135 135 - 145 mmol/L   Potassium 3.9 3.5 - 5.1 mmol/L   Chloride 106 101 - 111 mmol/L   CO2 24 22 - 32 mmol/L   Glucose, Bld 135 (H) 65 - 99 mg/dL   BUN 13 6 - 20 mg/dL   Creatinine, Ser 1.00 0.61 - 1.24 mg/dL   Calcium 8.0 (L) 8.9 - 10.3 mg/dL   GFR calc non Af Amer >60 >60 mL/min   GFR calc Af Amer >60 >60 mL/min   Anion gap 5 5 - 15  Glucose, capillary     Status: Abnormal   Collection Time: 12/05/15 11:48 AM  Result Value Ref Range   Glucose-Capillary 155 (H) 65 - 99 mg/dL    Dg Chest 1  View  12/05/2015  CLINICAL DATA:  Fever, coronary artery disease, congestive heart failure EXAM: CHEST 1 VIEW COMPARISON:  Chest radiograph 12/03/2015 FINDINGS: The lungs are well inflated. Cardiomediastinal contours are unchanged with mild atherosclerotic calcification in the aortic arch. No focal airspace consolidation or pulmonary edema. No pleural effusion or pneumothorax. IMPRESSION: 1. Unchanged examination.  No focal airspace disease. 2. Aortic atherosclerosis. Electronically Signed   By: Ulyses Jarred M.D.   On: 12/05/2015 13:25   Dg Chest 1 View  12/03/2015  CLINICAL DATA:  Preop evaluation for left hip surgery. EXAM: CHEST 1 VIEW COMPARISON:  02/05/2015 FINDINGS: Normal heart size and vascularity. Mild hyperinflation without focal pneumonia, collapse or consolidation. No edema, effusion or pneumothorax. Trachea is midline. Atherosclerosis of the aorta. IMPRESSION: Stable chest exam.  No acute process. Thoracic aortic atherosclerosis Electronically Signed   By: Jerilynn Mages.  Shick M.D.   On: 12/03/2015 20:48   Dg C-arm 1-60  Min  12/04/2015  CLINICAL DATA:  Patient status post ORIF left hip fracture. EXAM: LEFT FEMUR PORTABLE 2 VIEWS; DG C-ARM 61-120 MIN COMPARISON:  Radiograph 12/03/2015 FINDINGS: Two intraoperative fluoroscopic images were submitted demonstrating ORIF left femoral subcapital fracture. Improved anatomic alignment. IMPRESSION: Patient status post ORIF left femoral neck fracture. Electronically Signed   By: Lovey Newcomer M.D.   On: 12/04/2015 14:39   Dg Hip Port Unilat With Pelvis 1v Left  12/04/2015  CLINICAL DATA:  75 year old male post hip pinning. Subsequent encounter. EXAM: DG HIP (WITH OR WITHOUT PELVIS) 1V PORT LEFT COMPARISON:  Intraoperative 12/04/2015 exam. Preoperative 11/23/2015 exam. FINDINGS: Four pins placed across left subcapital femoral fracture. On the frontal view, pins appear contained within confines of the femoral head. Lateral view limited. Intraoperative lateral view reveals pins are located within the confines of the femoral head. Post remote lumbar spine surgery. IMPRESSION: Post open reduction left femoral neck fracture as noted above. Electronically Signed   By: Genia Del M.D.   On: 12/04/2015 15:11   Dg Femur Min 2 Views Left  12/03/2015  CLINICAL DATA:  Recent fall, left lower extremity pain. EXAM: LEFT FEMUR 2 VIEWS COMPARISON:  None available FINDINGS: Bones are osteopenic. Cortical buckling noted of the left proximal femur subcapital neck compatible with an acute hip fracture. No associated malalignment. Distal femur appears intact. Visualized bony pelvis unremarkable. Lower lumbar fusion hardware partially imaged. Peripheral atherosclerosis noted. IMPRESSION: Acute left hip subcapital femoral neck fracture. Osteopenia Peripheral atherosclerosis Electronically Signed   By: Jerilynn Mages.  Shick M.D.   On: 12/03/2015 20:21   Dg Femur Port Min 2 Views Left  12/04/2015  CLINICAL DATA:  Patient status post ORIF left hip fracture. EXAM: LEFT FEMUR PORTABLE 2 VIEWS; DG C-ARM 61-120 MIN  COMPARISON:  Radiograph 12/03/2015 FINDINGS: Two intraoperative fluoroscopic images were submitted demonstrating ORIF left femoral subcapital fracture. Improved anatomic alignment. IMPRESSION: Patient status post ORIF left femoral neck fracture. Electronically Signed   By: Lovey Newcomer M.D.   On: 12/04/2015 14:39    Assessment/Plan: 1 Day Post-Op   Principal Problem:   Fracture of femoral neck, left (HCC) Active Problems:   COPD (chronic obstructive pulmonary disease) (HCC)   Type 2 diabetes mellitus (HCC)   CAD (coronary artery disease)   Sleep apnea   Femoral neck fracture, left, closed, initial encounter  Patient making the expected progress postop. He will continue with physical and occupational therapy. Foley catheter has been removed. He will complete 24 hours postop antibiotics. Labs be rechecked in the morning. Patient is nonweightbearing on  the left lower extremity for 6 weeks postop to prevent fracture displacement or femoral head collapse. Patient will require skilled nursing facility upon discharge.    [place] , MD 12/05/2015, 2:05 PM

## 2015-12-05 NOTE — Clinical Social Work Note (Signed)
Clinical Social Work Assessment  Patient Details  Name: Jimmy Hill MRN: [id number] Date of Birth: 04/15/1941  Date of referral:  12/05/15               Reason for consult:  Facility Placement                Permission sought to share information with:  Chartered certified accountant granted to share information::  Yes, Verbal Permission Granted  Name::      [place]::   [place]   Relationship::     Contact Information:     Housing/Transportation Living arrangements for the past 2 months:  [place] of Information:  Patient, Adult Children Patient Interpreter Needed:  None Criminal Activity/Legal Involvement Pertinent to Current Situation/Hospitalization:  No - Comment as needed Significant Relationships:  Adult Children Lives with:  Self Do you feel safe going back to the place where you live?  Yes Need for family participation in patient care:  Yes (Comment)  Care giving concerns:  Patient lives in [place] alone and is followed by [facility].    Social Worker assessment / plan:  Holiday representative (CSW) received SNF consult. PT is recommending SNF. CSW met with patient alone at bedside to address consult. Patient was alert and oriented and was laying in the bed. CSW introduced self and explained role of CSW department. Patient reported that he is followed by [facility] and lives alone in [name]. Per patient his wife recently [name] for long term care. CSW explained that PT is recommending SNF. Patient reported that he would be agreeable to go to SNF for short term.   FL2 complete and faxed out. CSW presented bed offers to patient he chose [facility]. [name] liaison is aware of accepted bed offer. CSW contacted patient's daughter Jimmy Hill and made him aware of above. Jimmy Hill reported that patient will likely need long term care after rehab. CSW explained that [facility] and [facility] will work on a plan after rehab. CSW  explained that [facility] is patient's payer. Daughter verbalized her understanding. CSW also spoke with [facility] social worker Jimmy Hill and made him aware of above. CSW will continue to follow and assist as needed.   Employment status:  Disabled (Comment on whether or not currently receiving Disability), Retired Forensic scientist:  Other (Comment Required), Medicaid In [employer] ([place] ) PT Recommendations:  [place] / Referral to community resources:  [name]  Patient/Family's Response to care:  Patient and daughter are agreeable for patient to go to [facility].   Patient/Family's Understanding of and Emotional Response to Diagnosis, Current Treatment, and Prognosis:  Patient was pleasant and thanked CSW for visit.   Emotional Assessment Appearance:  Appears stated age Attitude/Demeanor/Rapport:    Affect (typically observed):  Accepting, Adaptable, Pleasant Orientation:  Oriented to Self, Oriented to Place, Oriented to  Time, Oriented to Situation Alcohol / Substance use:  Not Applicable Psych involvement (Current and /or in the community):  No (Comment)  Discharge Needs  Concerns to be addressed:  Discharge Planning Concerns Readmission within the last 30 days:  No Current discharge risk:  Dependent with Mobility Barriers to Discharge:  Continued Medical Work up   [employer], Jimmy Betters, LCSW 12/05/2015, 5:03 PM

## 2015-12-05 NOTE — Progress Notes (Signed)
[facility] [place] NAME: Jimmy Hill    MR#:  [id number]  DATE OF BIRTH:  1940/09/26  SUBJECTIVE:   Patient here with fall with left hip fracture. Status post open reduction internal fixation of the left hip postop day #1. Several have a significant pain in the left hip. Noted to have a low-grade fever of 100.9 today.  REVIEW OF SYSTEMS:    Review of Systems  Constitutional: Positive for fever. Negative for chills.  HENT: Negative for congestion and tinnitus.   Eyes: Negative for blurred vision and double vision.  Respiratory: Negative for cough, shortness of breath and wheezing.   Cardiovascular: Negative for chest pain, orthopnea and PND.  Gastrointestinal: Negative for nausea, vomiting, abdominal pain and diarrhea.  Genitourinary: Negative for dysuria and hematuria.  Musculoskeletal: Positive for joint pain (Left hip).  Neurological: Negative for dizziness, sensory change and focal weakness.  All other systems reviewed and are negative.   Nutrition: Carb control Tolerating Diet: Yes Tolerating PT: Yes   DRUG ALLERGIES:  No Known Allergies  VITALS:  Blood pressure 93/53, pulse 93, temperature 100.9 F (38.3 C), temperature source Axillary, resp. rate 20, height 5\' 8"  (1.727 m), weight 82.101 kg (181 lb), SpO2 94 %.  PHYSICAL EXAMINATION:   Physical Exam  GENERAL:  75 y.o.-year-old patient lying in the bed in no acute distress.  EYES: Pupils equal, round, reactive to light and accommodation. No scleral icterus. Extraocular muscles intact.  HEENT: Head atraumatic, normocephalic. Oropharynx and nasopharynx clear.  NECK:  Supple, no jugular venous distention. No thyroid enlargement, no tenderness.  LUNGS: Normal breath sounds bilaterally, no wheezing, rales, rhonchi. No use of accessory muscles of respiration.  CARDIOVASCULAR: S1, S2 normal. II/VI SEM at LSB, No rubs, or gallops.  ABDOMEN: Soft, nontender, nondistended. Bowel  sounds present. No organomegaly or mass.  EXTREMITIES: No cyanosis, clubbing or edema b/l. Left hip dressing in place.     NEUROLOGIC: Cranial nerves II through XII are intact. No focal Motor or sensory deficits b/l. Globally weak  PSYCHIATRIC: The patient is alert and oriented x 3.  SKIN: No obvious rash, lesion, or ulcer.    LABORATORY PANEL:   CBC  Recent Labs Lab 12/05/15 0957  WBC 12.9*  HGB 14.2  HCT 40.2  PLT 140*   ------------------------------------------------------------------------------------------------------------------  Chemistries   Recent Labs Lab 12/05/15 0957  NA 135  K 3.9  CL 106  CO2 24  GLUCOSE 135*  BUN 13  CREATININE 1.00  CALCIUM 8.0*   ------------------------------------------------------------------------------------------------------------------  Cardiac Enzymes No results for input(s): TROPONINI in the last 168 hours. ------------------------------------------------------------------------------------------------------------------  RADIOLOGY:  Dg Chest 1 View  12/05/2015  CLINICAL DATA:  Fever, coronary artery disease, congestive heart failure EXAM: CHEST 1 VIEW COMPARISON:  Chest radiograph 12/03/2015 FINDINGS: The lungs are well inflated. Cardiomediastinal contours are unchanged with mild atherosclerotic calcification in the aortic arch. No focal airspace consolidation or pulmonary edema. No pleural effusion or pneumothorax. IMPRESSION: 1. Unchanged examination.  No focal airspace disease. 2. Aortic atherosclerosis. Electronically Signed   By: Ulyses Jarred M.D.   On: 12/05/2015 13:25   Dg Chest 1 View  12/03/2015  CLINICAL DATA:  Preop evaluation for left hip surgery. EXAM: CHEST 1 VIEW COMPARISON:  02/05/2015 FINDINGS: Normal heart size and vascularity. Mild hyperinflation without focal pneumonia, collapse or consolidation. No edema, effusion or pneumothorax. Trachea is midline. Atherosclerosis of the aorta. IMPRESSION: Stable chest  exam.  No acute process. Thoracic aortic atherosclerosis Electronically  Signed   By: Jerilynn Mages.  Shick M.D.   On: 12/03/2015 20:48   Dg C-arm 1-60 Min  12/04/2015  CLINICAL DATA:  Patient status post ORIF left hip fracture. EXAM: LEFT FEMUR PORTABLE 2 VIEWS; DG C-ARM 61-120 MIN COMPARISON:  Radiograph 12/03/2015 FINDINGS: Two intraoperative fluoroscopic images were submitted demonstrating ORIF left femoral subcapital fracture. Improved anatomic alignment. IMPRESSION: Patient status post ORIF left femoral neck fracture. Electronically Signed   By: Lovey Newcomer M.D.   On: 12/04/2015 14:39   Dg Hip Port Unilat With Pelvis 1v Left  12/04/2015  CLINICAL DATA:  75 year old male post hip pinning. Subsequent encounter. EXAM: DG HIP (WITH OR WITHOUT PELVIS) 1V PORT LEFT COMPARISON:  Intraoperative 12/04/2015 exam. Preoperative 11/23/2015 exam. FINDINGS: Four pins placed across left subcapital femoral fracture. On the frontal view, pins appear contained within confines of the femoral head. Lateral view limited. Intraoperative lateral view reveals pins are located within the confines of the femoral head. Post remote lumbar spine surgery. IMPRESSION: Post open reduction left femoral neck fracture as noted above. Electronically Signed   By: Genia Del M.D.   On: 12/04/2015 15:11   Dg Femur Min 2 Views Left  12/03/2015  CLINICAL DATA:  Recent fall, left lower extremity pain. EXAM: LEFT FEMUR 2 VIEWS COMPARISON:  None available FINDINGS: Bones are osteopenic. Cortical buckling noted of the left proximal femur subcapital neck compatible with an acute hip fracture. No associated malalignment. Distal femur appears intact. Visualized bony pelvis unremarkable. Lower lumbar fusion hardware partially imaged. Peripheral atherosclerosis noted. IMPRESSION: Acute left hip subcapital femoral neck fracture. Osteopenia Peripheral atherosclerosis Electronically Signed   By: Jerilynn Mages.  Shick M.D.   On: 12/03/2015 20:21   Dg Femur Port Min 2 Views  Left  12/04/2015  CLINICAL DATA:  Patient status post ORIF left hip fracture. EXAM: LEFT FEMUR PORTABLE 2 VIEWS; DG C-ARM 61-120 MIN COMPARISON:  Radiograph 12/03/2015 FINDINGS: Two intraoperative fluoroscopic images were submitted demonstrating ORIF left femoral subcapital fracture. Improved anatomic alignment. IMPRESSION: Patient status post ORIF left femoral neck fracture. Electronically Signed   By: Lovey Newcomer M.D.   On: 12/04/2015 14:39     ASSESSMENT AND PLAN:   75 year old male with past medical history of peripheral vascular disease, COPD, CHF, GERD, osteoarthritis, depression, chronic kidney disease, diabetes who presented to the hospital M mechanical fall and noted to have a left hip fracture.  1. Left hip fracture-patient is status post open reduction or internal fixation of the left hip. Postop day #1. -Tolerating physical therapy, will DC Foley catheter today. -Continue pain control and further care as per orthopedics. Patient will likely need short-term rehabilitation upon discharge.  2. Fever of unknown origin-patient had low-grade fever of 100.9 today. Source unclear. -Chest x-ray negative for acute pathology, urinalysis is still pending. Continue incentive spirometry, when necessary Tylenol for the fever and follow fever curve.  3. Diabetes type 2 without complication-continue sliding scale insulin.  4. Glaucoma - cont. Timolol, alphagan eye drops.   5. Parkinson's disease - cont. Sinemet  6. Hyperlipidemia - cont. Atorvastatin  7. COPD - no acute exacerbation .  - cont. Dalisresp, Eads.   8. Depression - cont. Wellbutrin.   All the records are reviewed and case discussed with Care Management/Social Workerr. Management plans discussed with the patient, family and they are in agreement.  CODE STATUS: Full  DVT Prophylaxis: ASa, plavix.   TOTAL TIME TAKING CARE OF THIS PATIENT: 30 minutes.   POSSIBLE D/C IN 2-3 DAYS, DEPENDING ON CLINICAL  CONDITION.   Henreitta Leber M.D on 12/05/2015 at 1:59 PM  Between 7am to 6pm - Pager - [phone]  After 6pm go to [email or web address] - password [email or web address] [facility]  Office  [phone]  CC: Primary care physician; Gareth Morgan, MD

## 2015-12-05 NOTE — Progress Notes (Signed)
pts temp 100.9, has been very sleepy since returing from surg yesterday at 1430.  arousable but falls asleep while eating. Congested cough. Notified md. Orders recd. Will cont to monitor.

## 2015-12-05 NOTE — Care Management Note (Signed)
Case Management Note  Patient Details  Name: Jimmy Hill MRN: [id number] Date of Birth: September 13, 1940  Subjective/Objective:                  Met with patient to discuss discharge planning. He wants to return home where he stays alone. He states [facility] [phone] provides him with transportation. [facility] provides personal care services 1 hour in AM and 3 hours PM daily per patient.  His is not on home O2. PT pending. [facility] will assist with discharge planning.  Action/Plan: RNCM will continue to follow.   Expected Discharge Date:                  Expected Discharge Plan:     In-House Referral:     Discharge planning Services  CM Consult  Post Acute Care Choice:    Choice offered to:  Patient  DME Arranged:    DME Agency:     HH Arranged:    [place] Agency:     Status of Service:  In process, will continue to follow  If discussed at Long Length of Stay Meetings, dates discussed:    Additional Comments:  Marshell Garfinkel, RN 12/05/2015, 1:06 PM

## 2015-12-05 NOTE — Progress Notes (Signed)
Physical Therapy Treatment Patient Details Name: Jimmy Hill MRN: [id number] DOB: 02/03/1941 Today's Date: 12/05/2015    History of Present Illness Patient is a 75 y/o male that presents s/p fall at home with ORIF to repair L femoral neck fx. He has a history of Parkinson's, peripheral neuropathy, autonomic dysreflexia.     PT Comments    Pt required encouragement to participate in therapy.  Pt initially refusal for any repositioning however more accepting as session progressed.  Pt participated in supine therex as noted in chart with increased pain/spasm in L hip during heel slides.  At end of session nsg entered room and assisted with repositioning pt to [facility].   Follow Up Recommendations  SNF     Equipment Recommendations       Recommendations for Other Services OT consult     Precautions / Restrictions Precautions Precautions: Fall Restrictions Weight Bearing Restrictions: Yes LLE Weight Bearing: Non weight bearing    Mobility  Bed Mobility Overal bed mobility: Needs Assistance Bed Mobility: Rolling Rolling: Mod assist   Supine to sit: Mod assist;Max assist Sit to supine: Mod assist;Max assist   General bed mobility comments: Repositioned pt for pain mgmt  Transfers                 General transfer comment: Deferred secondary to pain/NWB status.   Ambulation/Gait                 Stairs            Wheelchair Mobility    Modified Rankin (Stroke Patients Only)       Balance Overall balance assessment: Needs assistance Sitting-balance support: Bilateral upper extremity supported Sitting balance-Leahy Scale: Fair Sitting balance - Comments: Patient requires bilateral UEs to maintain balance.  Postural control: Right lateral lean                          Cognition Arousal/Alertness: Awake/alert Behavior During Therapy: Flat affect Overall Cognitive Status: No family/caregiver present to determine baseline cognitive  functioning                      Exercises Total Joint Exercises Ankle Circles/Pumps: AROM;Left;10 reps;Supine Quad Sets: AROM;10 reps;Supine Gluteal Sets: AROM;Left;10 reps Heel Slides: PROM;Left;5 reps;Supine Straight Leg Raises: PROM;Left;AROM;Right;10 reps Long Arc Quad: AROM;Right;5 reps    General Comments        Pertinent Vitals/Pain Pain Assessment: 0-10 Pain Score: 6  Pain Location: L hip  Pain Descriptors / Indicators: Aching;Guarding;Spasm Pain Intervention(s): Limited activity within patient's tolerance    Home Living Family/patient expects to be discharged to:: Skilled nursing facility Living Arrangements: Alone             Additional Comments: his wife is at  [facility] as of 4 months ago    Prior Function Level of Independence: Independent with assistive device(s)      Comments: Patient reported that he has a standard walker, FWW and was using a FWW most recently.   PT Goals (current goals can now be found in the care plan section) Acute Rehab PT Goals Patient Stated Goal: To get stronger  PT Goal Formulation: With patient Time For Goal Achievement: 12/19/15 Potential to Achieve Goals: Fair    Frequency  BID    PT Plan      Co-evaluation             End of Session   Activity Tolerance: Patient  limited by pain Patient left: in bed;with call bell/phone within reach;with nursing/sitter in room     Time: 1445-1500 PT Time Calculation (min) (ACUTE ONLY): 15 min  Charges:  $Therapeutic Exercise: 8-22 mins                    G Codes:      Jimmy Hill 2015-12-08, 3:16 PM Kiera Hussey, PTA

## 2015-12-05 NOTE — Clinical Social Work Placement (Signed)
   CLINICAL SOCIAL WORK PLACEMENT  NOTE  Date:  12/05/2015  Patient Details  Name: Jimmy Hill MRN: [id number] Date of Birth: [birth year]  Clinical Social Work is seeking post-discharge placement for this patient at the [place] level of care (*CSW will initial, date and re-position this form in  chart as items are completed):  Yes   Patient/family provided with [facility] Work Department's list of facilities offering this level of care within the geographic area requested by the patient (or if unable, by the patient's family).  Yes   Patient/family informed of their freedom to choose among providers that offer the needed level of care, that participate in Medicare, Medicaid or managed care program needed by the patient, have an available bed and are willing to accept the patient.  Yes   Patient/family informed of [facility]'s ownership interest in [facility] and [facility], as well as of the fact that they are under no obligation to receive care at these facilities.  PASRR submitted to EDS on       PASRR number received on       Existing PASRR number confirmed on 12/05/15     FL2 transmitted to all facilities in geographic area requested by pt/family on 12/05/15     FL2 transmitted to all facilities within larger geographic area on       Patient informed that his/her managed care company has contracts with or will negotiate with certain facilities, including the following:        Yes   Patient/family informed of bed offers received.  Patient chooses bed at  ([facility] )     Physician recommends and patient chooses bed at      Patient to be transferred to   on  .  Patient to be transferred to facility by       Patient family notified on   of transfer.  Name of family member notified:        PHYSICIAN       Additional Comment:    _______________________________________________ Lenis Nettleton, Bronwen Betters, LCSW 12/05/2015, 5:02 PM

## 2015-12-05 NOTE — Clinical Documentation Improvement (Signed)
Internal Medicine  Based on the clinical indicators below please clarify if any of the following conditions is clinically supported.     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 A specific answer is not anticipated or expected.  Thank You,  Norm Parcel RN, BSN, [place] Clinical Documentation Specialist [facility] [phone]; cell [phone]

## 2015-12-05 NOTE — Evaluation (Signed)
Occupational Therapy Evaluation Patient Details Name: Jimmy Hill MRN: [id number] DOB: Mar 01, 1941 Today's Date: 12/05/2015    History of Present Illness Patient is a 75 y/o male that presents s/p fall at home with ORIF to repair L femoral neck fx. He has a history of Parkinson's, peripheral neuropathy, autonomic dysreflexia.    Clinical Impression     Pt. Is a 75 y.o. male who was admitted to [facility] for a ORIF for L hip and non WB. Patient presents with high pain level, limited ROM, weakness, limited activity tolerance, and impaired functional mobility for ADLs. Pt. Could benefit from skilled OT services for ADL and A/E retraining, and functional mobility for ADLs in order to improve overall ADL functioning, and return to PLOF. Rec SNF for continued rehab since he will be returning home alone (his wife is at [facility]).     Follow Up Recommendations  SNF    Equipment Recommendations       Recommendations for Other Services       Precautions / Restrictions Precautions Precautions: Fall Restrictions Weight Bearing Restrictions: Yes LLE Weight Bearing: Non weight bearing      Mobility Bed Mobility Overal bed mobility: Needs Assistance Bed Mobility: Supine to Sit;Sit to Supine     Supine to sit: Mod assist;Max assist Sit to supine: Mod assist;Max assist   General bed mobility comments: Patient requires significant assistance to bring LLE to EOB, has pain throughout transfer but tolerates sitting at EOB for roughly 5 minutes   Transfers                 General transfer comment: Deferred secondary to pain/NWB status.     Balance Overall balance assessment: Needs assistance Sitting-balance support: Bilateral upper extremity supported Sitting balance-Leahy Scale: Fair Sitting balance - Comments: Patient requires bilateral UEs to maintain balance.  Postural control: Right lateral lean                                  ADL Overall ADL's :  Needs assistance/impaired Eating/Feeding: Independent;Set up   Grooming: Wash/dry hands;Wash/dry face;Oral care;Applying deodorant;Brushing hair;Independent;Set up;Bed level           Upper Body Dressing : Independent;Set up   Lower Body Dressing: Total assistance;Set up;Adhering to hip precautions Lower Body Dressing Details (indicate cue type and reason): Rec use of AD for LB dresing and pt has a reacher but no sock aid, LH shoe horn, HH shower                General ADL Comments: Pt is total assist for LB dressing and bathing due to pain (4-5/10 at rest and 10/10 during mobility)and minimal mobility at this time.  He is non WB on  LLE.  He has hx of Parkinsons disease and lives alone since his wife went to [employer] 4 months ago. He is refusing to go to [facility] and Lydia from SW indicated this may be his only choice if [facility] does not have a bed per insurance.        Vision     Perception     Praxis      Pertinent Vitals/Pain Pain Assessment: 0-10 Pain Score: 5  Pain Location: L hip Pain Descriptors / Indicators: Aching;Constant Pain Intervention(s): Limited activity within patient's tolerance;Monitored during session;Premedicated before session;Repositioned;Ice applied     Hand Dominance Right (ambidextrous)   Extremity/Trunk Assessment Upper Extremity Assessment Upper Extremity  Assessment: Overall WFL for tasks assessed   Lower Extremity Assessment Lower Extremity Assessment: Defer to PT evaluation LLE Deficits / Details: NWB, unable to actively move in any direction secondary to pain.        Communication Communication Communication: No difficulties (flat affect)   Cognition Arousal/Alertness: Awake/alert Behavior During Therapy: Flat affect;WFL for tasks assessed/performed Overall Cognitive Status: No family/caregiver present to determine baseline cognitive functioning                     General Comments       Exercises        Shoulder Instructions      Home Living Family/patient expects to be discharged to:: Skilled nursing facility Living Arrangements: Alone                               Additional Comments: his wife is at  [facility] as of 4 months ago      Prior Functioning/Environment Level of Independence: Independent with assistive device(s)        Comments: Patient reported that he has a standard walker, FWW and was using a FWW most recently.    OT Diagnosis: Generalized weakness;Acute pain   OT Problem List: Decreased strength;Decreased range of motion;Decreased activity tolerance;Impaired balance (sitting and/or standing);Decreased knowledge of use of DME or AE;Decreased safety awareness;Pain   OT Treatment/Interventions: Self-care/ADL training;DME and/or AE instruction;Therapeutic activities    OT Goals(Current goals can be found in the care plan section) Acute Rehab OT Goals Patient Stated Goal: To get stronger  OT Goal Formulation: With patient Time For Goal Achievement: 12/19/15 Potential to Achieve Goals: Good ADL Goals Pt Will Perform Lower Body Dressing: with mod assist;with adaptive equipment;sit to/from stand (while boserving Non WB LLE) Pt Will Transfer to Toilet: with max assist;stand pivot transfer;bedside commode (BSC over toilet to simulate height at home. Non WB LLE)  OT Frequency: Min 1X/week   Barriers to D/C:    lives at home alone since his wife went to [employer] 4 months ago       Co-evaluation              End of Session    Activity Tolerance: Patient limited by pain Patient left: in bed;with call bell/phone within reach;with bed alarm set   Time: [id number] OT Time Calculation (min): 30 min Charges:  OT General Charges $OT Visit: 1 Procedure OT Evaluation $OT Eval Moderate Complexity: 1 Procedure OT Treatments $Self Care/Home Management : 8-22 mins G-Codes:     Chrys Racer, OTR/L ascom [phone] 12/05/2015, 3:02  PM

## 2015-12-05 NOTE — Evaluation (Signed)
Physical Therapy Evaluation Patient Details Name: Jimmy Hill MRN: [id number] DOB: 14-Mar-1941 Today's Date: 12/05/2015   History of Present Illness  Patient is a 75 y/o male that presents s/p fall at home with ORIF to repair L femoral neck fx. He has a history of Parkinson's, peripheral neuropathy, autonomic dysreflexia.   Clinical Impression  Patient has been living at home alone, per discussion with OT his wife is in ALF. Patient does not provide much of a history, he does appear to have been managing living alone, unclear if any AD. He is quite limited by pain, requiring significant assistance to complete bed mobility, maintain sitting balance via UEs. He is only able to tolerate short bout of sitting prior to asking to lay back down for pain control. He is unable to WB on LLE currently given surgical recommendations. He would benefit from SNF to increase his independence with mobility.     Follow Up Recommendations SNF    Equipment Recommendations       Recommendations for Other Services OT consult     Precautions / Restrictions Precautions Precautions: Fall Restrictions Weight Bearing Restrictions: Yes LLE Weight Bearing: Non weight bearing      Mobility  Bed Mobility Overal bed mobility: Needs Assistance Bed Mobility: Supine to Sit;Sit to Supine     Supine to sit: Mod assist;Max assist Sit to supine: Mod assist;Max assist   General bed mobility comments: Patient requires significant assistance to bring LLE to EOB, has pain throughout transfer but tolerates sitting at EOB for roughly 5 minutes   Transfers                 General transfer comment: Deferred secondary to pain/NWB status.   Ambulation/Gait                Stairs            Wheelchair Mobility    Modified Rankin (Stroke Patients Only)       Balance Overall balance assessment: Needs assistance Sitting-balance support: Bilateral upper extremity supported Sitting  balance-Leahy Scale: Fair Sitting balance - Comments: Patient requires bilateral UEs to maintain balance.  Postural control: Right lateral lean                                   Pertinent Vitals/Pain Pain Assessment: 0-10 Pain Score: 5  Pain Location: L hip Pain Descriptors / Indicators: Aching;Constant Pain Intervention(s): Limited activity within patient's tolerance;Monitored during session;Premedicated before session;Repositioned;Ice applied    Home Living Family/patient expects to be discharged to:: Skilled nursing facility Living Arrangements: Alone               Additional Comments: his wife is at  [facility] as of 4 months ago    Prior Function Level of Independence: Independent with assistive device(s)         Comments: Patient reported that he has a standard walker, FWW and was using a FWW most recently.     Hand Dominance   Dominant Hand: Right (ambidextrous)    Extremity/Trunk Assessment   Upper Extremity Assessment: Overall WFL for tasks assessed           Lower Extremity Assessment: Defer to PT evaluation   LLE Deficits / Details: NWB, unable to actively move in any direction secondary to pain.      Communication   Communication: No difficulties (flat affect)  Cognition Arousal/Alertness: Awake/alert Behavior During Therapy:  Flat affect;WFL for tasks assessed/performed Overall Cognitive Status: No family/caregiver present to determine baseline cognitive functioning                      General Comments      Exercises Total Joint Exercises Ankle Circles/Pumps: PROM;Both;10 reps Heel Slides: PROM;Left;AROM;Right;10 reps Straight Leg Raises: PROM;Left;AROM;Right;10 reps Long Arc Quad: AROM;Right;5 reps      Assessment/Plan    PT Assessment Patient needs continued PT services  PT Diagnosis Difficulty walking;Abnormality of gait;Generalized weakness   PT Problem List Decreased strength;Decreased knowledge of  use of DME;Decreased safety awareness;Decreased activity tolerance;Decreased knowledge of precautions;Decreased balance;Decreased mobility  PT Treatment Interventions DME instruction;Gait training;Stair training;Therapeutic activities;Therapeutic exercise;Balance training;Manual techniques;Wheelchair mobility training   PT Goals (Current goals can be found in the Care Plan section) Acute Rehab PT Goals Patient Stated Goal: To get stronger  PT Goal Formulation: With patient Time For Goal Achievement: 12/19/15 Potential to Achieve Goals: Fair    Frequency BID   Barriers to discharge Inaccessible home environment Patient lives alone.     Co-evaluation               End of Session   Activity Tolerance: Patient limited by pain Patient left: in bed;with call bell/phone within reach;with bed alarm set Nurse Communication: Mobility status         Time: 1036-1101 PT Time Calculation (min) (ACUTE ONLY): 25 min   Charges:   PT Evaluation $PT Eval Moderate Complexity: 1 Procedure PT Treatments $Therapeutic Exercise: 8-22 mins   PT G Codes:       Kerman Passey, PT, DPT    12/05/2015, 3:06 PM

## 2015-12-06 LAB — BASIC METABOLIC PANEL
Anion gap: 5 (ref 5–15)
BUN: 14 mg/dL (ref 6–20)
CHLORIDE: 104 mmol/L (ref 101–111)
CO2: 26 mmol/L (ref 22–32)
Calcium: 8.1 mg/dL — ABNORMAL LOW (ref 8.9–10.3)
Creatinine, Ser: 0.92 mg/dL (ref 0.61–1.24)
GFR calc non Af Amer: >60 mL/min (ref >60)
Glucose, Bld: 128 mg/dL — ABNORMAL HIGH (ref 65–99)
POTASSIUM: 3.5 mmol/L (ref 3.5–5.1)
SODIUM: 135 mmol/L (ref 135–145)

## 2015-12-06 LAB — CBC
HEMATOCRIT: 38.8 % — AB (ref 40.0–52.0)
HEMOGLOBIN: 13.5 g/dL (ref 13.0–18.0)
MCH: 31.9 pg (ref 26.0–34.0)
MCHC: 34.8 g/dL (ref 32.0–36.0)
MCV: 91.9 fL (ref 80.0–100.0)
Platelets: 129 10*3/uL — ABNORMAL LOW (ref 150–440)
RBC: 4.22 MIL/uL — AB (ref 4.40–5.90)
RDW: 13.5 % (ref 11.5–14.5)
WBC: 12 10*3/uL — ABNORMAL HIGH (ref 3.8–10.6)

## 2015-12-06 LAB — GLUCOSE, CAPILLARY
GLUCOSE-CAPILLARY: 123 mg/dL — AB (ref 65–99)
GLUCOSE-CAPILLARY: 132 mg/dL — AB (ref 65–99)

## 2015-12-06 MED ORDER — ASPIRIN 325 MG PO TBEC: 325.0000 mg | DELAYED_RELEASE_TABLET | Freq: Two times a day (BID) | ORAL | Status: DC

## 2015-12-06 MED ORDER — HYDROCODONE-ACETAMINOPHEN 5-325 MG PO TABS: 1.0000 | ORAL_TABLET | Freq: Four times a day (QID) | ORAL | Status: DC | PRN

## 2015-12-06 NOTE — Clinical Social Work Placement (Signed)
   CLINICAL SOCIAL WORK PLACEMENT  NOTE  Date:  12/06/2015  Patient Details  Name: Jimmy Hill MRN: [id number] Date of Birth: Mar 18, 1941  Clinical Social Work is seeking post-discharge placement for this patient at the [place] level of care (*CSW will initial, date and re-position this form in  chart as items are completed):  Yes   Patient/family provided with [facility] Work Department's list of facilities offering this level of care within the geographic area requested by the patient (or if unable, by the patient's family).  Yes   Patient/family informed of their freedom to choose among providers that offer the needed level of care, that participate in Medicare, Medicaid or managed care program needed by the patient, have an available bed and are willing to accept the patient.  Yes   Patient/family informed of [facility]'s ownership interest in [facility] and [facility], as well as of the fact that they are under no obligation to receive care at these facilities.  PASRR submitted to EDS on       PASRR number received on       Existing PASRR number confirmed on 12/05/15     FL2 transmitted to all facilities in geographic area requested by pt/family on 12/05/15     FL2 transmitted to all facilities within larger geographic area on       Patient informed that his/her managed care company has contracts with or will negotiate with certain facilities, including the following:        Yes   Patient/family informed of bed offers received.  [facility]     Physician recommends and patient chooses bed at      Patient to be transferred to  ([facility] ) on 12/06/15.  Patient to be transferred to facility by  ([facility] )     Patient family notified on 12/06/15 of transfer.  Name of family member notified:   (Patient's daughter [name] is aware of D/C today. )     PHYSICIAN       Additional Comment:     _______________________________________________ Oluwatamilore Starnes, Bronwen Betters, LCSW 12/06/2015, 12:00 PM

## 2015-12-06 NOTE — Progress Notes (Signed)
Patient discharging to [facility] today. [facility] to provide transport, plan to pick up at 230pm. Patient agreeable with discharge.

## 2015-12-06 NOTE — Discharge Summary (Signed)
[facility] [place] NAME: Jimmy Hill    MR#:  [id number]  [place] OF BIRTH:  17-Sep-1940  DATE OF ADMISSION:  12/03/2015 ADMITTING PHYSICIAN: Lance Coon, MD  DATE OF DISCHARGE: 12/06/2015  PRIMARY CARE PHYSICIAN: Gareth Morgan, MD    ADMISSION DIAGNOSIS:  Pain [R52] Hip fracture, left, closed, initial encounter ([place]) [S72.002A]  DISCHARGE DIAGNOSIS:  Principal Problem:   Fracture of femoral neck, left ([place]) Active Problems:   COPD (chronic obstructive pulmonary disease) (HCC)   Type 2 diabetes mellitus (HCC)   CAD (coronary artery disease)   Sleep apnea   Femoral neck fracture, left, closed, initial encounter   SECONDARY DIAGNOSIS:   Past Medical History  Diagnosis Date  . Myocardial infarction ([place])   . Coronary artery disease   . Anginal pain ([place])   . Parkinson's disease ([place])   . Diabetes mellitus without complication ([place])   . Tinnitus   . Peripheral vascular disease ([place])   . Dysphagia   . Glaucoma   . Autonomic dysreflexia   . H/O orthostatic hypotension   . RLS (restless legs syndrome)   . Depression   . Chronic kidney disease     STAGE 3  . Strabismic amblyopia   . Smoker   . Polyneuropathy ([place])   . Arthritis   . DJD (degenerative joint disease)   . Back pain     CHRONIC LBP,STENOSIS  . COPD (chronic obstructive pulmonary disease) ([place])   . Allergic rhinitis   . Sleep apnea   . CHF (congestive heart failure) ([place])   . GERD (gastroesophageal reflux disease)   . Edema     FEET/ANKLES  . Diabetes [facility])     HOSPITAL COURSE:   75 year old male with past medical history of peripheral vascular disease, COPD, CHF, GERD, osteoarthritis, depression, chronic kidney disease, diabetes who presented to the hospital M mechanical fall and noted to have a left hip fracture.  1. Left hip fracture- secondary to a mechanical fall.  patient is status post open reduction or internal fixation of the left hip. He is  Postop day #2. - he is Tolerating physical therapy and his pain is controlled on oral meds.  - he is being discharge to a SNF for ongoing rehab and follow up with Orthopedics as outpatient.   2. Fever of unknown origin-patient had low-grade fever of 100.9 on 12/05/15.  Now resolved.  - UA, CXR (-) and left hip wound looks good.  Related to atelectasis and now resolved. Cont. Incentive spirometry.  - fever resolved and hemodynamically stable.   3. Diabetes type 2 without complication-while in the hospital pt. Was on SSI and now being discharged back on his Lantus, Januvia, Metformin.    4. Glaucoma - he will cont. Timolol, alphagan eye drops.   5. Parkinson's disease - he will cont. Sinemet  6. Hyperlipidemia - he will  cont. Atorvastatin  7. COPD - no acute exacerbation .  - he will cont. Advair, Spiriva, Daliresp.    8. Depression - he will cont. Wellbutrin.   9. Hx of CAD - no acute chest pain while in the hospital.  - pt. Will cont. ASA, Plavix, Statin.   Pt. Is being discharged to SNF for further rehab. Pt. In agreement with this plan.   DISCHARGE CONDITIONS:   Stable.   CONSULTS OBTAINED:  Treatment Team:  Yolonda Kida, MD  DRUG ALLERGIES:  No Known Allergies  DISCHARGE MEDICATIONS:   Current Discharge  Medication List    CONTINUE these medications which have CHANGED   Details  aspirin EC 325 MG EC tablet Take 1 tablet (325 mg total) by mouth 2 (two) times daily. Qty: 30 tablet, Refills: 0    HYDROcodone-acetaminophen (NORCO/VICODIN) 5-325 MG tablet Take 1 tablet by mouth every 6 (six) hours as needed for moderate pain. Qty: 30 tablet, Refills: 0      CONTINUE these medications which have NOT CHANGED   Details  acetaminophen (TYLENOL) 325 MG tablet Take 650 mg by mouth every 6 (six) hours as needed for mild pain or headache.    albuterol (PROVENTIL) (2.5 MG/3ML) 0.083% nebulizer solution Take 2.5 mg by nebulization every 4 (four) hours as needed for  wheezing or shortness of breath.    atorvastatin (LIPITOR) 80 MG tablet Take 80 mg by mouth at bedtime.    brimonidine-timolol (COMBIGAN) 0.2-0.5 % ophthalmic solution Place 1 drop into both eyes 2 (two) times daily.    buPROPion (WELLBUTRIN XL) 300 MG 24 hr tablet Take 300 mg by mouth daily.    Calcium Carbonate-Vitamin D (CALCIUM 600+D) 600-400 MG-UNIT per tablet Take 1 tablet by mouth daily.    carbidopa-levodopa (SINEMET IR) 25-250 MG tablet Take 1 tablet by mouth 4 (four) times daily.    Carboxymethylcellul-Glycerin (OPTIVE) 0.5-0.9 % SOLN Apply 1-2 drops to eye 3 (three) times daily as needed (for dry eyes).    cetirizine (ZYRTEC) 10 MG tablet Take 10 mg by mouth at bedtime.    clopidogrel (PLAVIX) 75 MG tablet Take 75 mg by mouth daily.    fluticasone (FLONASE) 50 MCG/ACT nasal spray Place 2 sprays into both nostrils daily as needed for rhinitis.    Fluticasone-Salmeterol (ADVAIR) 500-50 MCG/DOSE AEPB Inhale into the lungs 2 (two) times daily.    gabapentin (NEURONTIN) 800 MG tablet Take 600 mg by mouth. 1 AM 1 SUPPERTIME 2 HS    guaiFENesin (MUCINEX) 600 MG 12 hr tablet Take 600 mg by mouth daily.    insulin glargine (LANTUS) 100 UNIT/ML injection Inject into the skin 2 (two) times daily. 45 UNITS AM 40 UNITS PM    lidocaine (XYLOCAINE) 5 % ointment Apply 1 application topically 2 (two) times daily as needed. TO FEET    metFORMIN (GLUCOPHAGE) 1000 MG tablet Take 1,000 mg by mouth.    nicotine (NICODERM CQ - DOSED IN MG/24 HOURS) 21 mg/24hr patch Place 21 mg onto the skin daily. X 6 WEEKS    nitroGLYCERIN (NITROSTAT) 0.4 MG SL tablet Place 0.4 mg under the tongue every 5 (five) minutes as needed for chest pain.    QUEtiapine (SEROQUEL) 25 MG tablet Take 25 mg by mouth at bedtime.    ranitidine (ZANTAC) 150 MG tablet Take 150 mg by mouth at bedtime.    roflumilast (DALIRESP) 500 MCG TABS tablet Take 500 mcg by mouth daily.    sitaGLIPtin (JANUVIA) 100 MG tablet Take  100 mg by mouth daily.    tiotropium (SPIRIVA) 18 MCG inhalation capsule Place 18 mcg into inhaler and inhale daily. 2 PUFFS    vitamin B-12 (CYANOCOBALAMIN) 1000 MCG tablet Take 1,000 mcg by mouth daily.         DISCHARGE INSTRUCTIONS:   DIET:  Cardiac diet and Diabetic diet  DISCHARGE CONDITION:  Stable  ACTIVITY:  Activity as tolerated  OXYGEN:  Home Oxygen: No.   Oxygen Delivery: room air  DISCHARGE LOCATION:  nursing home   If you experience worsening of your admission symptoms, develop shortness of breath, life  threatening emergency, suicidal or homicidal thoughts you must seek medical attention immediately by calling 911 or calling your MD immediately  if symptoms less severe.  You Must read complete instructions/literature along with all the possible adverse reactions/side effects for all the Medicines you take and that have been prescribed to you. Take any new Medicines after you have completely understood and accpet all the possible adverse reactions/side effects.   Please note  You were cared for by a hospitalist during your hospital stay. If you have any questions about your discharge medications or the care you received while you were in the hospital after you are discharged, you can call the unit and asked to speak with the hospitalist on call if the hospitalist that took care of you is not available. Once you are discharged, your primary care physician will handle any further medical issues. Please note that NO REFILLS for any discharge medications will be authorized once you are discharged, as it is imperative that you return to your primary care physician (or establish a relationship with a primary care physician if you do not have one) for your aftercare needs so that they can reassess your need for medications and monitor your lab values.     Today   Still having some left hip pain.  No SOB, CP, N/V.  Afebrile overnight.    VITAL SIGNS:  Blood pressure  97/62, pulse 89, temperature 98.6 F (37 C), temperature source Oral, resp. rate 18, height 5\' 8"  (1.727 m), weight 82.101 kg (181 lb), SpO2 97 %.  I/O:   Intake/Output Summary (Last 24 hours) at 12/06/15 1129 Last data filed at 12/06/15 0814  Gross per 24 hour  Intake    120 ml  Output    700 ml  Net   -580 ml    PHYSICAL EXAMINATION:   GENERAL: 75 y.o.-year-old patient lying in the bed in no acute distress.  EYES: Pupils equal, round, reactive to light and accommodation. No scleral icterus. Extraocular muscles intact.  HEENT: Head atraumatic, normocephalic. Oropharynx and nasopharynx clear.  NECK: Supple, no jugular venous distention. No thyroid enlargement, no tenderness.  LUNGS: Normal breath sounds bilaterally, no wheezing, rales, rhonchi. No use of accessory muscles of respiration.  CARDIOVASCULAR: S1, S2 normal. II/VI SEM at LSB, No rubs, or gallops.  ABDOMEN: Soft, nontender, nondistended. Bowel sounds present. No organomegaly or mass.  EXTREMITIES: No cyanosis, clubbing or edema b/l. Left hip dressing in place with no acute drainage.    NEUROLOGIC: Cranial nerves II through XII are intact. No focal Motor or sensory deficits b/l. Globally weak  PSYCHIATRIC: The patient is alert and oriented x 3.  SKIN: No obvious rash, lesion, or ulcer.   DATA REVIEW:   CBC  Recent Labs Lab 12/06/15 0443  WBC 12.0*  HGB 13.5  HCT 38.8*  PLT 129*    Chemistries   Recent Labs Lab 12/06/15 0443  NA 135  K 3.5  CL 104  CO2 26  GLUCOSE 128*  BUN 14  CREATININE 0.92  CALCIUM 8.1*    Cardiac Enzymes No results for input(s): TROPONINI in the last 168 hours.  Microbiology Results  Results for orders placed or performed during the hospital encounter of 12/03/15  Urine culture     Status: None   Collection Time: 12/03/15 10:26 PM  Result Value Ref Range Status   Specimen Description URINE, RANDOM  Final   Special Requests NONE  Final   Culture NO  GROWTH Performed at [facility]  Hospital   Final   Report Status 12/05/2015 FINAL  Final  Surgical pcr screen     Status: None   Collection Time: 12/03/15 10:26 PM  Result Value Ref Range Status   MRSA, PCR NEGATIVE NEGATIVE Final   Staphylococcus aureus NEGATIVE NEGATIVE Final    Comment:        The Xpert SA Assay (FDA approved for NASAL specimens in patients over 84 years of age), is one component of a comprehensive surveillance program.  Test performance has been validated by [facility] for patients greater than or equal to 36 year old. It is not intended to diagnose infection nor to guide or monitor treatment.     RADIOLOGY:  Dg Chest 1 View  12/05/2015  CLINICAL DATA:  Fever, coronary artery disease, congestive heart failure EXAM: CHEST 1 VIEW COMPARISON:  Chest radiograph 12/03/2015 FINDINGS: The lungs are well inflated. Cardiomediastinal contours are unchanged with mild atherosclerotic calcification in the aortic arch. No focal airspace consolidation or pulmonary edema. No pleural effusion or pneumothorax. IMPRESSION: 1. Unchanged examination.  No focal airspace disease. 2. Aortic atherosclerosis. Electronically Signed   By: Ulyses Jarred M.D.   On: 12/05/2015 13:25   Dg C-arm 1-60 Min  12/04/2015  CLINICAL DATA:  Patient status post ORIF left hip fracture. EXAM: LEFT FEMUR PORTABLE 2 VIEWS; DG C-ARM 61-120 MIN COMPARISON:  Radiograph 12/03/2015 FINDINGS: Two intraoperative fluoroscopic images were submitted demonstrating ORIF left femoral subcapital fracture. Improved anatomic alignment. IMPRESSION: Patient status post ORIF left femoral neck fracture. Electronically Signed   By: Lovey Newcomer M.D.   On: 12/04/2015 14:39   Dg Hip Port Unilat With Pelvis 1v Left  12/04/2015  CLINICAL DATA:  75 year old male post hip pinning. Subsequent encounter. EXAM: DG HIP (WITH OR WITHOUT PELVIS) 1V PORT LEFT COMPARISON:  Intraoperative 12/04/2015 exam. Preoperative 11/23/2015 exam. FINDINGS:  Four pins placed across left subcapital femoral fracture. On the frontal view, pins appear contained within confines of the femoral head. Lateral view limited. Intraoperative lateral view reveals pins are located within the confines of the femoral head. Post remote lumbar spine surgery. IMPRESSION: Post open reduction left femoral neck fracture as noted above. Electronically Signed   By: Genia Del M.D.   On: 12/04/2015 15:11   Dg Femur Port Min 2 Views Left  12/04/2015  CLINICAL DATA:  Patient status post ORIF left hip fracture. EXAM: LEFT FEMUR PORTABLE 2 VIEWS; DG C-ARM 61-120 MIN COMPARISON:  Radiograph 12/03/2015 FINDINGS: Two intraoperative fluoroscopic images were submitted demonstrating ORIF left femoral subcapital fracture. Improved anatomic alignment. IMPRESSION: Patient status post ORIF left femoral neck fracture. Electronically Signed   By: Lovey Newcomer M.D.   On: 12/04/2015 14:39      Management plans discussed with the patient, family and they are in agreement.  CODE STATUS:  Code Status History    Date Active Date Inactive Code Status Order ID Comments User Context   12/03/2015  8:59 PM 12/04/2015  3:30 PM Full Code [id number]  Thornton Park, MD ED   02/05/2015  4:14 PM 02/06/2015  2:04 PM Full Code [id number]  Theodoro Grist, MD Inpatient   12/10/2014 12:47 AM 12/10/2014  4:36 PM Full Code [id number]  Nicholes Mango, MD Inpatient      TOTAL TIME TAKING CARE OF THIS PATIENT: 40 minutes.    Henreitta Leber M.D on 12/06/2015 at 11:29 AM  Between 7am to 6pm - Pager - [phone]  After 6pm go to [email or web address] - password [email or web address] [facility]  Hospitalists  Office  [phone]  CC: Primary care physician; Gareth Morgan, MD

## 2015-12-06 NOTE — Progress Notes (Signed)
Physical Therapy Treatment Patient Details Name: Jimmy Hill MRN: [id number] DOB: 08-31-1940 Today's Date: 12/06/2015    History of Present Illness Patient is a 75 y/o male that presents s/p fall at home with ORIF to repair L femoral neck fx. He has a history of Parkinson's, peripheral neuropathy, autonomic dysreflexia.     PT Comments    Pt agreeable to therapy with encouragement.  Pt attempted supine to sit transfer on L.  Pt would not allow PTA assist for LLE placement.  PTA recommended attempt transfer to R which pt was agreeable to.  D/t pt requesting to perform independently PTA recommended pt use RLE to assist movement of LLE. Pt required multiple rest breaks 2/2 fatigue. Pt allowed PTA to provide recommendations on sequencing and ultimately allowing support to LLE as pt was very sensitive to passive movement.  With extended time pt achieved supine to sit at EOB where pt was able to sit at EOB x5 min however request to return to bed. Pt too fatigued to successfully return to supine as PTA provided assist for LLE placement however pt becoming agitated with PTA assist. Once supine pt able to boost self to [facility] with use of bed rails and RLE.   Follow Up Recommendations  SNF     Equipment Recommendations       Recommendations for Other Services       Precautions / Restrictions Precautions Precautions: Fall Restrictions Weight Bearing Restrictions: Yes LLE Weight Bearing: Non weight bearing    Mobility  Bed Mobility Overal bed mobility: Needs Assistance Bed Mobility: Supine to Sit     Supine to sit: Max assist Sit to supine: Max assist   General bed mobility comments: Pt very particlar with assist provided, pt performed transfer to R with extensive time, max cues and all features  Transfers                    Ambulation/Gait                 Stairs            Wheelchair Mobility    Modified Rankin (Stroke Patients Only)       Balance  Overall balance assessment: Needs assistance Sitting-balance support: Bilateral upper extremity supported Sitting balance-Leahy Scale: Fair                              Cognition Arousal/Alertness: Awake/alert Behavior During Therapy: WFL for tasks assessed/performed Overall Cognitive Status: No family/caregiver present to determine baseline cognitive functioning                      Exercises Total Joint Exercises Ankle Circles/Pumps: AROM;Both;10 reps    General Comments        Pertinent Vitals/Pain Pain Assessment: 0-10 Pain Score: 9  Pain Location: L hip  Pain Descriptors / Indicators: Aching;Spasm Pain Intervention(s): Limited activity within patient's tolerance;RN gave pain meds during session    Home Living                      Prior Function            PT Goals (current goals can now be found in the care plan section)      Frequency  BID    PT Plan      Co-evaluation  End of Session   Activity Tolerance: Patient limited by pain Patient left: in bed;with call bell/phone within reach;with bed alarm set     Time: 1005-1055 PT Time Calculation (min) (ACUTE ONLY): 50 min  Charges:  $Therapeutic Activity: 38-52 mins                    G Codes:      Royce Stegman 12-31-15, 11:38 AM  Rocklyn Mayberry, PTA

## 2015-12-06 NOTE — Progress Notes (Signed)
Report called to [facility]. Pt transported by [facility] via wheelchair & Lucianne Lei. Belongings sent with patient.

## 2015-12-06 NOTE — Progress Notes (Signed)
Patient is medically stable for D/C to [facility] today. Per [name] liaison patient will go to room 106. RN will call report to RN Elmyra Ricks. [facility] will transport patient at 2:30 today. [facility] driver and RN aide will come to patient's room with a [facility] wheel chair. Clinical Education officer, museum (CSW) sent D/C Summary, FL2 and D/C Packet to [facility] via [facility] and [facility]. Patient is aware of above. CSW contacted patient's daughter [name] and made her aware of above. Please reconsult if future social work needs arise. CSW signing off.   [employer], LCSW [phone]

## 2015-12-06 NOTE — Progress Notes (Signed)
Jimmy Hill with [facility] came and picked up patient's keys to his apartment with his permission to get some thing he needs from his apartment.

## 2015-12-06 NOTE — Discharge Instructions (Signed)
Patient will be non weightbearing on the left lower extremity with the assistance of a walker. Patient should continue to receive physical therapy for hip and lower extremity range of motion, strengthening and gait training. He should elevate the left lower extremity whenever possible. He may have ice applied to the surgical site to help reduce swelling. Dressing should be changed when necessary if drainage is observed. Continue DVT prophylaxis for 4-6 weeks postop with plavix and aspirin. Patient will follow with Dr, Mack Guise in his office in 10-14 days. Staples will be removed in the orthopedic office.  [facility], Dr. Mack Guise, [phone]

## 2015-12-06 NOTE — Progress Notes (Signed)
Subjective:  Postoperative #2 status post percutaneous fixation for left femoral neck hip fracture. Patient reports pain as mild at rest but marked with movement of the left hip..  Patient is seen with Magda Paganini, his nurse at the bedside.  Objective:   VITALS:   Filed Vitals:   12/05/15 1543 12/05/15 1937 12/06/15 0355 12/06/15 0754  BP: 90/55 97/56 98/56  97/62  Pulse: 87 84 91 89  Temp:  98.9 F (37.2 C) 98.3 F (36.8 C) 98.6 F (37 C)  TempSrc:  Oral Oral Oral  Resp:  18 18   Height:      Weight:      SpO2:  94% 94% 97%    PHYSICAL EXAM:  Left lower extremity: Patient's dressing is clean dry and intact. His thigh and leg compartments are soft and compressible. He has palpable pedal pulses and intact motor function distally. 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  LABS  Results for orders placed or performed during the hospital encounter of 12/03/15 (from the past 24 hour(s))  Urinalysis complete, with microscopic ([facility] only)     Status: Abnormal   Collection Time: 12/05/15  2:40 PM  Result Value Ref Range   Color, Urine YELLOW (A) YELLOW   APPearance CLEAR (A) CLEAR   Glucose, UA NEGATIVE NEGATIVE mg/dL   Bilirubin Urine NEGATIVE NEGATIVE   Ketones, ur NEGATIVE NEGATIVE mg/dL   Specific Gravity, Urine 1.019 1.005 - 1.030   Hgb urine dipstick NEGATIVE NEGATIVE   pH 5.0 5.0 - 8.0   Protein, ur NEGATIVE NEGATIVE mg/dL   Nitrite NEGATIVE NEGATIVE   Leukocytes, UA TRACE (A) NEGATIVE   RBC / HPF 0-5 0 - 5 RBC/hpf   WBC, UA 0-5 0 - 5 WBC/hpf   Bacteria, UA RARE (A) NONE SEEN   Squamous Epithelial / LPF NONE SEEN NONE SEEN   Mucous PRESENT    Hyaline Casts, UA PRESENT   Glucose, capillary     Status: Abnormal   Collection Time: 12/05/15  4:49 PM  Result Value Ref Range   Glucose-Capillary 132 (H) 65 - 99 mg/dL  Glucose, capillary     Status: Abnormal   Collection Time: 12/05/15 11:52 PM  Result Value Ref Range   Glucose-Capillary 196 (H) 65 - 99 mg/dL  CBC     Status: Abnormal   Collection Time: 12/06/15  4:43 AM  Result Value Ref Range   WBC 12.0 (H) 3.8 - 10.6 K/uL   RBC 4.22 (L) 4.40 - 5.90 MIL/uL   Hemoglobin 13.5 13.0 - 18.0 g/dL   HCT 38.8 (L) 40.0 - 52.0 %   MCV 91.9 80.0 - 100.0 fL   MCH 31.9 26.0 - 34.0 pg   MCHC 34.8 32.0 - 36.0 g/dL   RDW 13.5 11.5 - 14.5 %   Platelets 129 (L) 150 - 440 K/uL  Basic metabolic panel     Status: Abnormal   Collection Time: 12/06/15  4:43 AM  Result Value Ref Range   Sodium 135 135 - 145 mmol/L   Potassium 3.5 3.5 - 5.1 mmol/L   Chloride 104 101 - 111 mmol/L   CO2 26 22 - 32 mmol/L   Glucose, Bld 128 (H) 65 - 99 mg/dL   BUN 14 6 - 20 mg/dL   Creatinine, Ser 0.92 0.61 - 1.24 mg/dL   Calcium 8.1 (L) 8.9 - 10.3 mg/dL   GFR calc non Af Amer >60 >60 mL/min   GFR calc Af Amer >60 >60  mL/min   Anion gap 5 5 - 15  Glucose, capillary     Status: Abnormal   Collection Time: 12/06/15  5:47 AM  Result Value Ref Range   Glucose-Capillary 123 (H) 65 - 99 mg/dL    Dg Chest 1 View  12/05/2015  CLINICAL DATA:  Fever, coronary artery disease, congestive heart failure EXAM: CHEST 1 VIEW COMPARISON:  Chest radiograph 12/03/2015 FINDINGS: The lungs are well inflated. Cardiomediastinal contours are unchanged with mild atherosclerotic calcification in the aortic arch. No focal airspace consolidation or pulmonary edema. No pleural effusion or pneumothorax. IMPRESSION: 1. Unchanged examination.  No focal airspace disease. 2. Aortic atherosclerosis. Electronically Signed   By: Ulyses Jarred M.D.   On: 12/05/2015 13:25   Dg C-arm 1-60 Min  12/04/2015  CLINICAL DATA:  Patient status post ORIF left hip fracture. EXAM: LEFT FEMUR PORTABLE 2 VIEWS; DG C-ARM 61-120 MIN COMPARISON:  Radiograph 12/03/2015 FINDINGS: Two intraoperative fluoroscopic images were submitted demonstrating ORIF left femoral subcapital fracture. Improved anatomic alignment. IMPRESSION: Patient status post  ORIF left femoral neck fracture. Electronically Signed   By: Lovey Newcomer M.D.   On: 12/04/2015 14:39   Dg Hip Port Unilat With Pelvis 1v Left  12/04/2015  CLINICAL DATA:  75 year old male post hip pinning. Subsequent encounter. EXAM: DG HIP (WITH OR WITHOUT PELVIS) 1V PORT LEFT COMPARISON:  Intraoperative 12/04/2015 exam. Preoperative 11/23/2015 exam. FINDINGS: Four pins placed across left subcapital femoral fracture. On the frontal view, pins appear contained within confines of the femoral head. Lateral view limited. Intraoperative lateral view reveals pins are located within the confines of the femoral head. Post remote lumbar spine surgery. IMPRESSION: Post open reduction left femoral neck fracture as noted above. Electronically Signed   By: Genia Del M.D.   On: 12/04/2015 15:11   Dg Femur Port Min 2 Views Left  12/04/2015  CLINICAL DATA:  Patient status post ORIF left hip fracture. EXAM: LEFT FEMUR PORTABLE 2 VIEWS; DG C-ARM 61-120 MIN COMPARISON:  Radiograph 12/03/2015 FINDINGS: Two intraoperative fluoroscopic images were submitted demonstrating ORIF left femoral subcapital fracture. Improved anatomic alignment. IMPRESSION: Patient status post ORIF left femoral neck fracture. Electronically Signed   By: Lovey Newcomer M.D.   On: 12/04/2015 14:39    Assessment/Plan: 2 Days Post-Op   Principal Problem:   Fracture of femoral neck, left (HCC) Active Problems:   COPD (chronic obstructive pulmonary disease) (HCC)   Type 2 diabetes mellitus (HCC)   CAD (coronary artery disease)   Sleep apnea   Femoral neck fracture, left, closed, initial encounter  Patient has done well postop from an orthopedic standpoint. He has been cleared for discharge to a skilled nursing facility by the hospitalist service. I agree with the plan for discharge and will see him back in my office in 10 days. Patient will remain nonweightbearing on the left lower extremity until follow-up in my office. His staples were being  removed in my office. He will continue his Plavix and will also be on enteric-coated aspirin 325 mg twice a day for DVT prophylaxis.    Thornton Park , MD 12/06/2015, 11:48 AM

## 2015-12-28 ENCOUNTER — Emergency Department: Payer: Medicare (Managed Care)

## 2015-12-28 ENCOUNTER — Observation Stay
Admission: EM | Admit: 2015-12-28 | Discharge: 2015-12-30 | Disposition: A | Discharge status: Home / Self Care | Payer: Medicare (Managed Care) | Attending: Internal Medicine | Admitting: Internal Medicine

## 2015-12-28 ENCOUNTER — Encounter: Payer: Self-pay | Admitting: Emergency Medicine

## 2015-12-28 DIAGNOSIS — R569 Unspecified convulsions: Principal | ICD-10-CM

## 2015-12-28 DIAGNOSIS — G9341 Metabolic encephalopathy: Secondary | ICD-10-CM | POA: Insufficient documentation

## 2015-12-28 DIAGNOSIS — M1990 Unspecified osteoarthritis, unspecified site: Secondary | ICD-10-CM | POA: Insufficient documentation

## 2015-12-28 DIAGNOSIS — F329 Major depressive disorder, single episode, unspecified: Secondary | ICD-10-CM | POA: Insufficient documentation

## 2015-12-28 DIAGNOSIS — Z79899 Other long term (current) drug therapy: Secondary | ICD-10-CM | POA: Insufficient documentation

## 2015-12-28 DIAGNOSIS — I252 Old myocardial infarction: Secondary | ICD-10-CM | POA: Diagnosis not present

## 2015-12-28 DIAGNOSIS — I130 Hypertensive heart and chronic kidney disease with heart failure and stage 1 through stage 4 chronic kidney disease, or unspecified chronic kidney disease: Secondary | ICD-10-CM | POA: Diagnosis not present

## 2015-12-28 DIAGNOSIS — I2510 Atherosclerotic heart disease of native coronary artery without angina pectoris: Secondary | ICD-10-CM | POA: Diagnosis not present

## 2015-12-28 DIAGNOSIS — E785 Hyperlipidemia, unspecified: Secondary | ICD-10-CM | POA: Insufficient documentation

## 2015-12-28 DIAGNOSIS — E1142 Type 2 diabetes mellitus with diabetic polyneuropathy: Secondary | ICD-10-CM | POA: Diagnosis not present

## 2015-12-28 DIAGNOSIS — N183 Chronic kidney disease, stage 3 (moderate): Secondary | ICD-10-CM | POA: Insufficient documentation

## 2015-12-28 DIAGNOSIS — G20 Parkinson's disease: Secondary | ICD-10-CM | POA: Diagnosis not present

## 2015-12-28 DIAGNOSIS — H409 Unspecified glaucoma: Secondary | ICD-10-CM | POA: Insufficient documentation

## 2015-12-28 DIAGNOSIS — K219 Gastro-esophageal reflux disease without esophagitis: Secondary | ICD-10-CM | POA: Insufficient documentation

## 2015-12-28 DIAGNOSIS — I509 Heart failure, unspecified: Secondary | ICD-10-CM | POA: Insufficient documentation

## 2015-12-28 DIAGNOSIS — Z79891 Long term (current) use of opiate analgesic: Secondary | ICD-10-CM | POA: Insufficient documentation

## 2015-12-28 DIAGNOSIS — L8990 Pressure ulcer of unspecified site, unspecified stage: Secondary | ICD-10-CM | POA: Insufficient documentation

## 2015-12-28 DIAGNOSIS — E1151 Type 2 diabetes mellitus with diabetic peripheral angiopathy without gangrene: Secondary | ICD-10-CM | POA: Diagnosis not present

## 2015-12-28 DIAGNOSIS — Z9889 Other specified postprocedural states: Secondary | ICD-10-CM | POA: Insufficient documentation

## 2015-12-28 DIAGNOSIS — G9389 Other specified disorders of brain: Secondary | ICD-10-CM | POA: Insufficient documentation

## 2015-12-28 DIAGNOSIS — Z8249 Family history of ischemic heart disease and other diseases of the circulatory system: Secondary | ICD-10-CM | POA: Insufficient documentation

## 2015-12-28 DIAGNOSIS — G2581 Restless legs syndrome: Secondary | ICD-10-CM | POA: Insufficient documentation

## 2015-12-28 DIAGNOSIS — Z955 Presence of coronary angioplasty implant and graft: Secondary | ICD-10-CM | POA: Insufficient documentation

## 2015-12-28 DIAGNOSIS — F17210 Nicotine dependence, cigarettes, uncomplicated: Secondary | ICD-10-CM | POA: Insufficient documentation

## 2015-12-28 DIAGNOSIS — E1122 Type 2 diabetes mellitus with diabetic chronic kidney disease: Secondary | ICD-10-CM | POA: Insufficient documentation

## 2015-12-28 DIAGNOSIS — Z794 Long term (current) use of insulin: Secondary | ICD-10-CM | POA: Insufficient documentation

## 2015-12-28 DIAGNOSIS — J449 Chronic obstructive pulmonary disease, unspecified: Secondary | ICD-10-CM | POA: Insufficient documentation

## 2015-12-28 DIAGNOSIS — R4182 Altered mental status, unspecified: Secondary | ICD-10-CM

## 2015-12-28 DIAGNOSIS — E1139 Type 2 diabetes mellitus with other diabetic ophthalmic complication: Secondary | ICD-10-CM | POA: Insufficient documentation

## 2015-12-28 DIAGNOSIS — J309 Allergic rhinitis, unspecified: Secondary | ICD-10-CM | POA: Insufficient documentation

## 2015-12-28 DIAGNOSIS — Z7982 Long term (current) use of aspirin: Secondary | ICD-10-CM | POA: Insufficient documentation

## 2015-12-28 DIAGNOSIS — N390 Urinary tract infection, site not specified: Secondary | ICD-10-CM | POA: Insufficient documentation

## 2015-12-28 DIAGNOSIS — Z9049 Acquired absence of other specified parts of digestive tract: Secondary | ICD-10-CM | POA: Insufficient documentation

## 2015-12-28 DIAGNOSIS — G4730 Sleep apnea, unspecified: Secondary | ICD-10-CM | POA: Insufficient documentation

## 2015-12-28 LAB — CBC
HEMATOCRIT: 39.4 % — AB (ref 40.0–52.0)
Hemoglobin: 13.5 g/dL (ref 13.0–18.0)
MCH: 32.1 pg (ref 26.0–34.0)
MCHC: 34.2 g/dL (ref 32.0–36.0)
MCV: 93.8 fL (ref 80.0–100.0)
Platelets: 248 10*3/uL (ref 150–440)
RBC: 4.2 MIL/uL — ABNORMAL LOW (ref 4.40–5.90)
RDW: 14.2 % (ref 11.5–14.5)
WBC: 10.6 10*3/uL (ref 3.8–10.6)

## 2015-12-28 LAB — COMPREHENSIVE METABOLIC PANEL
ALT: 8 U/L — ABNORMAL LOW (ref 17–63)
ANION GAP: 7 (ref 5–15)
AST: 22 U/L (ref 15–41)
Albumin: 3.3 g/dL — ABNORMAL LOW (ref 3.5–5.0)
Alkaline Phosphatase: 104 U/L (ref 38–126)
BILIRUBIN TOTAL: 0.8 mg/dL (ref 0.3–1.2)
BUN: 19 mg/dL (ref 6–20)
CALCIUM: 8.8 mg/dL — AB (ref 8.9–10.3)
CHLORIDE: 103 mmol/L (ref 101–111)
CO2: 29 mmol/L (ref 22–32)
CREATININE: 1.09 mg/dL (ref 0.61–1.24)
GLUCOSE: 72 mg/dL (ref 65–99)
POTASSIUM: 3.9 mmol/L (ref 3.5–5.1)
Sodium: 139 mmol/L (ref 135–145)
Total Protein: 6.5 g/dL (ref 6.5–8.1)

## 2015-12-28 LAB — URINALYSIS COMPLETE WITH MICROSCOPIC (ARMC ONLY)
BACTERIA UA: NONE SEEN
Bilirubin Urine: NEGATIVE
GLUCOSE, UA: NEGATIVE mg/dL
HGB URINE DIPSTICK: NEGATIVE
NITRITE: NEGATIVE
Protein, ur: NEGATIVE mg/dL
Specific Gravity, Urine: 1.016 (ref 1.005–1.030)
pH: 6 (ref 5.0–8.0)

## 2015-12-28 LAB — MAGNESIUM: Magnesium: 1.7 mg/dL (ref 1.7–2.4)

## 2015-12-28 LAB — GLUCOSE, CAPILLARY
GLUCOSE-CAPILLARY: 65 mg/dL (ref 65–99)
Glucose-Capillary: 193 mg/dL — ABNORMAL HIGH (ref 65–99)

## 2015-12-28 LAB — MRSA PCR SCREENING: MRSA by PCR: NEGATIVE

## 2015-12-28 LAB — PHOSPHORUS: Phosphorus: 3.6 mg/dL (ref 2.5–4.6)

## 2015-12-28 MED ORDER — ACETAMINOPHEN 650 MG RE SUPP: 650.0000 mg | Freq: Four times a day (QID) | RECTAL | Status: DC | PRN

## 2015-12-28 MED ORDER — ASPIRIN EC 81 MG PO TBEC
81.0000 mg | DELAYED_RELEASE_TABLET | Freq: Every day | ORAL | Status: DC
Start: 2015-12-28 — End: 2015-12-30
  Administered 2015-12-29: 81 mg via ORAL
  Filled 2015-12-28: qty 1

## 2015-12-28 MED ORDER — BRIMONIDINE TARTRATE 0.2 % OP SOLN
1.0000 [drp] | Freq: Two times a day (BID) | OPHTHALMIC | Status: DC
  Administered 2015-12-29 (×2): 1 [drp] via OPHTHALMIC
  Filled 2015-12-28: qty 5

## 2015-12-28 MED ORDER — BRIMONIDINE TARTRATE-TIMOLOL 0.2-0.5 % OP SOLN: 1.0000 [drp] | Freq: Two times a day (BID) | OPHTHALMIC | Status: DC

## 2015-12-28 MED ORDER — ENOXAPARIN SODIUM 40 MG/0.4ML ~~LOC~~ SOLN
40.0000 mg | SUBCUTANEOUS | Status: DC
  Administered 2015-12-29: 40 mg via SUBCUTANEOUS
  Filled 2015-12-28: qty 0.4

## 2015-12-28 MED ORDER — SENNOSIDES-DOCUSATE SODIUM 8.6-50 MG PO TABS
1.0000 | ORAL_TABLET | Freq: Two times a day (BID) | ORAL | Status: DC
  Administered 2015-12-29 – 2015-12-30 (×2): 1 via ORAL
  Filled 2015-12-28 (×2): qty 1

## 2015-12-28 MED ORDER — SULFAMETHOXAZOLE-TRIMETHOPRIM 800-160 MG PO TABS
1.0000 | ORAL_TABLET | Freq: Two times a day (BID) | ORAL | Status: DC
  Administered 2015-12-29 – 2015-12-30 (×3): 1 via ORAL
  Filled 2015-12-28 (×4): qty 1

## 2015-12-28 MED ORDER — SODIUM CHLORIDE 0.9 % IV SOLN
1000.0000 mL | Freq: Once | INTRAVENOUS | Status: AC
  Administered 2015-12-28: 1000 mL via INTRAVENOUS

## 2015-12-28 MED ORDER — VITAMIN B-12 1000 MCG PO TABS
1000.0000 ug | ORAL_TABLET | Freq: Every day | ORAL | Status: DC
  Administered 2015-12-30: 1000 ug via ORAL
  Filled 2015-12-28: qty 1

## 2015-12-28 MED ORDER — ROFLUMILAST 500 MCG PO TABS
500.0000 ug | ORAL_TABLET | Freq: Every day | ORAL | Status: DC
  Administered 2015-12-29 – 2015-12-30 (×2): 500 ug via ORAL
  Filled 2015-12-28 (×2): qty 1

## 2015-12-28 MED ORDER — CARBIDOPA-LEVODOPA 25-250 MG PO TABS
1.0000 | ORAL_TABLET | Freq: Four times a day (QID) | ORAL | Status: DC
  Administered 2015-12-29 – 2015-12-30 (×4): 1 via ORAL
  Filled 2015-12-28 (×4): qty 1

## 2015-12-28 MED ORDER — BUPROPION HCL ER (XL) 150 MG PO TB24
300.0000 mg | ORAL_TABLET | Freq: Every day | ORAL | Status: DC
  Administered 2015-12-29: 300 mg via ORAL
  Filled 2015-12-28 (×2): qty 2

## 2015-12-28 MED ORDER — TIOTROPIUM BROMIDE MONOHYDRATE 18 MCG IN CAPS
18.0000 ug | ORAL_CAPSULE | Freq: Every day | RESPIRATORY_TRACT | Status: DC
  Filled 2015-12-28: qty 5

## 2015-12-28 MED ORDER — ATORVASTATIN CALCIUM 20 MG PO TABS
80.0000 mg | ORAL_TABLET | Freq: Every day | ORAL | Status: DC
  Administered 2015-12-29: 80 mg via ORAL
  Filled 2015-12-28: qty 4

## 2015-12-28 MED ORDER — MOMETASONE FURO-FORMOTEROL FUM 200-5 MCG/ACT IN AERO
2.0000 | INHALATION_SPRAY | Freq: Two times a day (BID) | RESPIRATORY_TRACT | Status: DC
  Administered 2015-12-29: 2 via RESPIRATORY_TRACT
  Filled 2015-12-28: qty 8.8

## 2015-12-28 MED ORDER — DEXTROSE 50 % IV SOLN
INTRAVENOUS | Status: AC
  Administered 2015-12-28: 50 mL
  Filled 2015-12-28: qty 50

## 2015-12-28 MED ORDER — TIMOLOL MALEATE 0.5 % OP SOLN
1.0000 [drp] | Freq: Two times a day (BID) | OPHTHALMIC | Status: DC
  Administered 2015-12-29 (×2): 1 [drp] via OPHTHALMIC
  Filled 2015-12-28: qty 5

## 2015-12-28 MED ORDER — CLOPIDOGREL BISULFATE 75 MG PO TABS
75.0000 mg | ORAL_TABLET | Freq: Every day | ORAL | Status: DC
  Administered 2015-12-29 – 2015-12-30 (×2): 75 mg via ORAL
  Filled 2015-12-28 (×2): qty 1

## 2015-12-28 MED ORDER — FAMOTIDINE 20 MG PO TABS
20.0000 mg | ORAL_TABLET | Freq: Every day | ORAL | Status: DC
  Administered 2015-12-29 – 2015-12-30 (×2): 20 mg via ORAL
  Filled 2015-12-28 (×2): qty 1

## 2015-12-28 MED ORDER — ACETAMINOPHEN 325 MG PO TABS
650.0000 mg | ORAL_TABLET | Freq: Four times a day (QID) | ORAL | Status: DC | PRN
Start: 2015-12-28 — End: 2015-12-30

## 2015-12-28 MED ORDER — ALBUTEROL SULFATE (2.5 MG/3ML) 0.083% IN NEBU: 2.5000 mg | INHALATION_SOLUTION | RESPIRATORY_TRACT | Status: DC | PRN

## 2015-12-28 MED ORDER — FLUTICASONE PROPIONATE 50 MCG/ACT NA SUSP
2.0000 | Freq: Every day | NASAL | Status: DC | PRN
  Filled 2015-12-28: qty 16

## 2015-12-28 MED ORDER — ONDANSETRON HCL 4 MG PO TABS: 4.0000 mg | ORAL_TABLET | Freq: Four times a day (QID) | ORAL | Status: DC | PRN

## 2015-12-28 MED ORDER — INSULIN ASPART 100 UNIT/ML ~~LOC~~ SOLN: 0.0000 [IU] | Freq: Three times a day (TID) | SUBCUTANEOUS | Status: DC

## 2015-12-28 MED ORDER — NITROGLYCERIN 0.4 MG SL SUBL
0.4000 mg | SUBLINGUAL_TABLET | SUBLINGUAL | Status: DC | PRN
Start: 2015-12-28 — End: 2015-12-30

## 2015-12-28 MED ORDER — QUETIAPINE FUMARATE 25 MG PO TABS
25.0000 mg | ORAL_TABLET | Freq: Every day | ORAL | Status: DC
  Administered 2015-12-29: 25 mg via ORAL
  Filled 2015-12-28: qty 1

## 2015-12-28 MED ORDER — ONDANSETRON HCL 4 MG/2ML IJ SOLN: 4.0000 mg | Freq: Four times a day (QID) | INTRAMUSCULAR | Status: DC | PRN

## 2015-12-28 MED ORDER — GABAPENTIN 300 MG PO CAPS
600.0000 mg | ORAL_CAPSULE | Freq: Every day | ORAL | Status: DC
  Administered 2015-12-29: 600 mg via ORAL
  Filled 2015-12-28: qty 2

## 2015-12-28 MED ORDER — LORATADINE 10 MG PO TABS
10.0000 mg | ORAL_TABLET | Freq: Every day | ORAL | Status: DC
  Administered 2015-12-30: 10 mg via ORAL
  Filled 2015-12-28: qty 1

## 2015-12-28 MED ORDER — CALCIUM CARBONATE-VITAMIN D 500-200 MG-UNIT PO TABS
1.0000 | ORAL_TABLET | Freq: Every day | ORAL | Status: DC
  Administered 2015-12-30: 1 via ORAL
  Filled 2015-12-28: qty 1

## 2015-12-28 MED ORDER — INSULIN ASPART 100 UNIT/ML ~~LOC~~ SOLN: 0.0000 [IU] | Freq: Every day | SUBCUTANEOUS | Status: DC

## 2015-12-28 NOTE — H&P (Signed)
[facility] [place] NAME: Jimmy Hill    MR#:  [id number]  DATE OF BIRTH:  Sep 14, 1940  DATE OF ADMISSION:  12/28/2015  PRIMARY CARE PHYSICIAN: Gareth Morgan, MD   REQUESTING/REFERRING PHYSICIAN: Dr. Lavonia Drafts  CHIEF COMPLAINT:   Chief Complaint  Patient presents with  . Seizures    HISTORY OF PRESENT ILLNESS:  Jimmy Hill  is a 75 y.o. male with a known history of Parkinson's disease, chronic kidney disease stage III, history of CHF, diabetes, depression, coronary artery disease, COPD glaucoma glaucoma, restless leg syndrome, peripheral vascular disease who presents to the hospital due to altered mental status and a witnessed seizure. Patient currently is in a postictal and altered state that for most history obtained from the ER physician and on chart. Patient apparently was just recently discharge to a skilled nursing facility after having hip surgery. [facility] and while he was there he was witnessed to have a tonic-clonic seizure. He was given 1-1-1/2 mg of Ativan and sent to the ER for further evaluation. Patient has still not come back to baseline and remains in a postictal state. Hospitalist services were contacted further treatment and evaluation. Patient has no previous history of seizures. His CT head on admission has been negative.  PAST MEDICAL HISTORY:   Past Medical History:  Diagnosis Date  . Allergic rhinitis   . Anginal pain ([place])   . Arthritis   . Autonomic dysreflexia   . Back pain    CHRONIC LBP,STENOSIS  . CHF (congestive heart failure) ([place])   . Chronic kidney disease    STAGE 3  . COPD (chronic obstructive pulmonary disease) ([place])   . Coronary artery disease   . Depression   . Diabetes ([place])   . Diabetes mellitus without complication ([place])   . DJD (degenerative joint disease)   . Dysphagia   . Edema    FEET/ANKLES  . GERD (gastroesophageal reflux disease)   . Glaucoma    . H/O orthostatic hypotension   . Myocardial infarction ([place])   . Parkinson's disease ([place])   . Peripheral vascular disease ([place])   . Polyneuropathy ([place])   . RLS (restless legs syndrome)   . Sleep apnea   . Smoker   . Strabismic amblyopia   . Tinnitus     PAST SURGICAL HISTORY:   Past Surgical History:  Procedure Laterality Date  . ANKLE SURGERY    . APPENDECTOMY    . BACK SURGERY    . CORONARY ANGIOPLASTY     STENT  . CTR    . EYE SURGERY    . HIP PINNING,CANNULATED Left 12/04/2015   Procedure: CANNULATED HIP PINNING;  Surgeon: Thornton Park, MD;  Location: [facility] ORS;  Service: Orthopedics;  Laterality: Left;    SOCIAL HISTORY:   Social History  Substance Use Topics  . Smoking status: Current Some Day Smoker    Packs/day: 0.50    Types: Cigarettes  . Smokeless tobacco: Never Used  . Alcohol use No    FAMILY HISTORY:   Family History  Problem Relation Age of Onset  . Heart disease Father     DRUG ALLERGIES:  No Known Allergies  REVIEW OF SYSTEMS:   Review of Systems  Unable to perform ROS: Mental acuity    MEDICATIONS AT HOME:   Prior to Admission medications   Medication Sig Start Date End Date Taking? Authorizing Provider  acetaminophen (TYLENOL) 325 MG tablet  Take 650 mg by mouth every 4 (four) hours as needed for mild pain or headache.    Yes Historical Provider, MD  albuterol (PROVENTIL) (2.5 MG/3ML) 0.083% nebulizer solution Take 2.5 mg by nebulization every 4 (four) hours as needed for wheezing or shortness of breath.   Yes Historical Provider, MD  aspirin EC 81 MG tablet Take 81 mg by mouth at bedtime.   Yes Historical Provider, MD  atorvastatin (LIPITOR) 80 MG tablet Take 80 mg by mouth at bedtime.   Yes Historical Provider, MD  brimonidine-timolol (COMBIGAN) 0.2-0.5 % ophthalmic solution Place 1 drop into both eyes 2 (two) times daily.   Yes Historical Provider, MD  buPROPion (WELLBUTRIN XL) 300 MG 24 hr tablet Take 300 mg by mouth daily.    Yes Historical Provider, MD  Calcium Carbonate-Vitamin D (CALCIUM 600+D) 600-400 MG-UNIT per tablet Take 1 tablet by mouth daily.   Yes Historical Provider, MD  carbidopa-levodopa (SINEMET IR) 25-250 MG tablet Take 1 tablet by mouth 4 (four) times daily.   Yes Historical Provider, MD  Carboxymethylcellul-Glycerin (OPTIVE) 0.5-0.9 % SOLN Apply 1-2 drops to eye 3 (three) times daily as needed (for dry eyes).   Yes Historical Provider, MD  cetirizine (ZYRTEC) 10 MG tablet Take 10 mg by mouth at bedtime.   Yes Historical Provider, MD  clopidogrel (PLAVIX) 75 MG tablet Take 75 mg by mouth daily.   Yes Historical Provider, MD  dextrose (GLUTOSE) 40 % GEL Take 1 Tube by mouth.   Yes Historical Provider, MD  fluticasone (FLONASE) 50 MCG/ACT nasal spray Place 2 sprays into both nostrils daily as needed for rhinitis.   Yes Historical Provider, MD  Fluticasone-Salmeterol (ADVAIR) 500-50 MCG/DOSE AEPB Inhale 1 puff into the lungs 2 (two) times daily.    Yes Historical Provider, MD  gabapentin (NEURONTIN) 600 MG tablet Take 600 mg by mouth. 1 AM 1 SUPPERTIME 2 HS   Yes Historical Provider, MD  guaiFENesin (MUCINEX) 600 MG 12 hr tablet Take 600 mg by mouth daily.   Yes Historical Provider, MD  HYDROcodone-acetaminophen (NORCO/VICODIN) 5-325 MG tablet Take 1 tablet by mouth every 6 (six) hours as needed for moderate pain.   Yes Historical Provider, MD  insulin glargine (LANTUS) 100 UNIT/ML injection Inject 35-43 Units into the skin 2 (two) times daily. 43 UNITS AM 35 UNITS PM   Yes Historical Provider, MD  lidocaine (XYLOCAINE) 5 % ointment Apply 1 application topically 2 (two) times daily as needed. TO FEET   Yes Historical Provider, MD  liver oil-zinc oxide (DESITIN) 40 % ointment Apply 1 application topically as needed for irritation.   Yes Historical Provider, MD  metformin (FORTAMET) 1000 MG (OSM) 24 hr tablet Take 2,000 mg by mouth daily.   Yes Historical Provider, MD  nitroGLYCERIN (NITROSTAT) 0.4 MG SL  tablet Place 0.4 mg under the tongue every 5 (five) minutes as needed for chest pain.   Yes Historical Provider, MD  ondansetron (ZOFRAN) 4 MG tablet Take 4 mg by mouth every 8 (eight) hours as needed for nausea or vomiting.   Yes Historical Provider, MD  QUEtiapine (SEROQUEL) 25 MG tablet Take 25 mg by mouth at bedtime. 04/05/15  Yes Historical Provider, MD  ranitidine (ZANTAC) 150 MG tablet Take 150 mg by mouth at bedtime.   Yes Historical Provider, MD  roflumilast (DALIRESP) 500 MCG TABS tablet Take 500 mcg by mouth daily.   Yes Historical Provider, MD  sennosides-docusate sodium (SENOKOT-S) 8.6-50 MG tablet Take 1 tablet by mouth 2 (two)  times daily.   Yes Historical Provider, MD  sitaGLIPtin (JANUVIA) 100 MG tablet Take 100 mg by mouth daily.   Yes Historical Provider, MD  sulfamethoxazole-trimethoprim (BACTRIM DS,SEPTRA DS) 800-160 MG tablet Take 1 tablet by mouth 2 (two) times daily.   Yes Historical Provider, MD  tiotropium (SPIRIVA) 18 MCG inhalation capsule Place 18 mcg into inhaler and inhale daily. 2 PUFFS   Yes Historical Provider, MD  vitamin B-12 (CYANOCOBALAMIN) 1000 MCG tablet Take 1,000 mcg by mouth daily.   Yes Historical Provider, MD  Vitamin D, Ergocalciferol, (DRISDOL) 50000 units CAPS capsule Take 50,000 Units by mouth every 30 (thirty) days.   Yes Historical Provider, MD      VITAL SIGNS:  Blood pressure 128/64, pulse 85, temperature 97.4 F (36.3 C), temperature source Oral, resp. rate 12, height 6\' 1"  (1.854 m), weight 83.4 kg (183 lb 12.8 oz), SpO2 97 %.  PHYSICAL EXAMINATION:  Physical Exam  GENERAL:  75 y.o.-year-old patient lying in the bed lethargic/encephalpathic. EYES: Pupils equal, round, reactive to light and accommodation. No scleral icterus. Extraocular muscles intact.  HEENT: Head atraumatic, normocephalic. Oropharynx and nasopharynx clear. No oropharyngeal erythema, dry oral mucosa  NECK:  Supple, no jugular venous distention. No thyroid enlargement, no  tenderness.  LUNGS: Normal breath sounds bilaterally, no wheezing, rales, rhonchi. No use of accessory muscles of respiration.  CARDIOVASCULAR: S1, S2 RRR. No murmurs, rubs, gallops, clicks.  ABDOMEN: Soft, nontender, nondistended. Bowel sounds present. No organomegaly or mass.  EXTREMITIES: No pedal edema, cyanosis, or clubbing. + 2 pedal & radial pulses b/l.   NEUROLOGIC: Moves all extremities spontaneously, difficult to do a full neurological exam given his encephalopathy. PSYCHIATRIC: The patient is alert and oriented x 1.  SKIN: No obvious rash, lesion, or ulcer.   LABORATORY PANEL:   CBC  Recent Labs Lab 12/28/15 1240  WBC 10.6  HGB 13.5  HCT 39.4*  PLT 248   ------------------------------------------------------------------------------------------------------------------  Chemistries   Recent Labs Lab 12/28/15 1240  NA 139  K 3.9  CL 103  CO2 29  GLUCOSE 72  BUN 19  CREATININE 1.09  CALCIUM 8.8*  AST 22  ALT 8*  ALKPHOS 104  BILITOT 0.8   ------------------------------------------------------------------------------------------------------------------  Cardiac Enzymes No results for input(s): TROPONINI in the last 168 hours. ------------------------------------------------------------------------------------------------------------------  RADIOLOGY:  Ct Head Wo Contrast  Result Date: 12/28/2015 CLINICAL DATA:  New onset seizures today.  Fell 2 days ago. EXAM: CT HEAD WITHOUT CONTRAST TECHNIQUE: Contiguous axial images were obtained from the base of the skull through the vertex without intravenous contrast. COMPARISON:  Head CT 06/13/2014 FINDINGS: Stable age related cerebral atrophy, ventriculomegaly and periventricular white matter disease. No extra-axial fluid collections are identified. No CT findings for acute hemispheric infarction or intracranial hemorrhage. No mass lesions. The brainstem and cerebellum are normal. No acute skull fracture. No bone  lesions. The paranasal sinuses and mastoid air cells are clear. The globes are intact. IMPRESSION: Stable age related cerebral atrophy, ventriculomegaly and periventricular white matter disease. No acute intracranial findings or skull fracture. Electronically Signed   By: Marijo Sanes M.D.   On: 12/28/2015 13:42   Dg Chest Port 1 View  Result Date: 12/28/2015 CLINICAL DATA:  Seizures EXAM: PORTABLE CHEST 1 VIEW COMPARISON:  12/05/2015 FINDINGS: The heart size and mediastinal contours are within normal limits. Both lungs are clear. The visualized skeletal structures are unremarkable. IMPRESSION: No active disease. Electronically Signed   By: Inez Catalina M.D.   On: 12/28/2015 15:35  IMPRESSION AND PLAN:   75 year old male with past medical history of Parkinson's disease, hypertension, chronic kidney disease stage III, restless leg syndrome, diabetes, GERD, glaucoma, history of coronary artery disease who came to the hospital due to a witnessed seizure and altered mental status.  1. Altered mental status-this is metabolic encephalopathy secondary to the seizure and postictal state. -CT head shows no acute pathology. No evidence of acute infectious source. We'll follow mental status.  2. Seizures-new-onset for the patient. Patient apparently had a witnessed tonic-clonic seizure today. -CT head is negative for any acute pathology. I will get an MRI of the brain with and without contrast. -Get an EEG, get a neurology consult and discussed the case with Dr. Doy Mince.  3. Diabetes type 2 without complication-we'll place the patient on sliding scale insulin.  4. History of Parkinson's disease-continue Sinemet.  5. Glaucoma-continue Combigan eyedrops.  6. COPD-no acute exacerbation, continue Spiriva, Daliresp, albuterol nebulizers as needed.  7. Depression-continue Wellbutrin.  8. Hyperlipidemia-continue atorvastatin.    All the records are reviewed and case discussed with ED  provider. Management plans discussed with the patient, family and they are in agreement.  CODE STATUS: Full  TOTAL TIME TAKING CARE OF THIS PATIENT: 45 minutes.    Henreitta Leber M.D on 12/28/2015 at 3:48 PM  Between 7am to 6pm - Pager - [phone]  After 6pm go to [email or web address] - password [email or web address] [place] [facility]  Office  [phone]  CC: Primary care physician; Gareth Morgan, MD

## 2015-12-28 NOTE — ED Notes (Signed)
Patient recently has hip surgery and is currently in rehab. Patient diagnosed with UTI and started on antibiotics yesterday. [facility] reports that patient fell on Wednesday but as far as she knows he did not hit his head. Nurse reports increased confusions for the past few days.

## 2015-12-28 NOTE — Progress Notes (Signed)
Pt blood sugar 65. 50 ml D50 given per IV at 2140 Recheck of blood sugar at 2155 193 Will continue to monitor

## 2015-12-28 NOTE — ED Triage Notes (Signed)
Patient brought in by [facility] from [facility] for 2 episodes of witnessed seizures. No prior seizure history. Patient was given Ativan 1.5 mg at 11:54. On arrival patient responsive to pain

## 2015-12-28 NOTE — ED Notes (Signed)
Patient responsive to stimuli.

## 2015-12-28 NOTE — ED Provider Notes (Addendum)
[facility] Emergency Department Provider Note   ____________________________________________    I have reviewed the triage vital signs and the nursing notes.   HISTORY  Chief Complaint Seizures  History limited by altered mental status   HPI Jimmy Hill is a 75 y.o. male who presents after a witnessed seizure. Nurses accompany the patient reports that he recently had a hip fracture and is in rehabilitation at [facility] and he was sitting in his chair today and had a witnessed tonic-clonic seizure. No history of seizures reported. He did not fall. No head injury. Reportedly was given Ativan IV   Past Medical History:  Diagnosis Date  . Allergic rhinitis   . Anginal pain ([place])   . Arthritis   . Autonomic dysreflexia   . Back pain    CHRONIC LBP,STENOSIS  . CHF (congestive heart failure) ([place])   . Chronic kidney disease    STAGE 3  . COPD (chronic obstructive pulmonary disease) ([place])   . Coronary artery disease   . Depression   . Diabetes ([place])   . Diabetes mellitus without complication ([place])   . DJD (degenerative joint disease)   . Dysphagia   . Edema    FEET/ANKLES  . GERD (gastroesophageal reflux disease)   . Glaucoma   . H/O orthostatic hypotension   . Myocardial infarction ([place])   . Parkinson's disease ([place])   . Peripheral vascular disease ([place])   . Polyneuropathy ([place])   . RLS (restless legs syndrome)   . Sleep apnea   . Smoker   . Strabismic amblyopia   . Tinnitus     Patient Active Problem List   Diagnosis Date Noted  . Fracture of femoral neck, left ([place]) 12/03/2015  . CAD (coronary artery disease) 12/03/2015  . Sleep apnea 12/03/2015  . Femoral neck fracture, left, closed, initial encounter 12/03/2015  . COPD (chronic obstructive pulmonary disease) ([place]) 02/05/2015  . Acute bronchitis 02/05/2015  . Type 2 diabetes mellitus ([place]) 02/05/2015  . Chest pain 12/09/2014    Past Surgical History:  Procedure Laterality  Date  . ANKLE SURGERY    . APPENDECTOMY    . BACK SURGERY    . CORONARY ANGIOPLASTY     STENT  . CTR    . EYE SURGERY    . HIP PINNING,CANNULATED Left 12/04/2015   Procedure: CANNULATED HIP PINNING;  Surgeon: Thornton Park, MD;  Location: [facility] ORS;  Service: Orthopedics;  Laterality: Left;    Prior to Admission medications   Medication Sig Start Date End Date Taking? Authorizing Provider  acetaminophen (TYLENOL) 325 MG tablet Take 650 mg by mouth every 4 (four) hours as needed for mild pain or headache.    Yes Historical Provider, MD  albuterol (PROVENTIL) (2.5 MG/3ML) 0.083% nebulizer solution Take 2.5 mg by nebulization every 4 (four) hours as needed for wheezing or shortness of breath.   Yes Historical Provider, MD  aspirin EC 81 MG tablet Take 81 mg by mouth at bedtime.   Yes Historical Provider, MD  atorvastatin (LIPITOR) 80 MG tablet Take 80 mg by mouth at bedtime.   Yes Historical Provider, MD  brimonidine-timolol (COMBIGAN) 0.2-0.5 % ophthalmic solution Place 1 drop into both eyes 2 (two) times daily.   Yes Historical Provider, MD  buPROPion (WELLBUTRIN XL) 300 MG 24 hr tablet Take 300 mg by mouth daily.   Yes Historical Provider, MD  Calcium Carbonate-Vitamin D (CALCIUM 600+D) 600-400 MG-UNIT per tablet Take 1 tablet by mouth daily.  Yes Historical Provider, MD  carbidopa-levodopa (SINEMET IR) 25-250 MG tablet Take 1 tablet by mouth 4 (four) times daily.   Yes Historical Provider, MD  Carboxymethylcellul-Glycerin (OPTIVE) 0.5-0.9 % SOLN Apply 1-2 drops to eye 3 (three) times daily as needed (for dry eyes).   Yes Historical Provider, MD  cetirizine (ZYRTEC) 10 MG tablet Take 10 mg by mouth at bedtime.   Yes Historical Provider, MD  clopidogrel (PLAVIX) 75 MG tablet Take 75 mg by mouth daily.   Yes Historical Provider, MD  dextrose (GLUTOSE) 40 % GEL Take 1 Tube by mouth.   Yes Historical Provider, MD  fluticasone (FLONASE) 50 MCG/ACT nasal spray Place 2 sprays into both nostrils  daily as needed for rhinitis.   Yes Historical Provider, MD  Fluticasone-Salmeterol (ADVAIR) 500-50 MCG/DOSE AEPB Inhale 1 puff into the lungs 2 (two) times daily.    Yes Historical Provider, MD  gabapentin (NEURONTIN) 600 MG tablet Take 600 mg by mouth. 1 AM 1 SUPPERTIME 2 HS   Yes Historical Provider, MD  guaiFENesin (MUCINEX) 600 MG 12 hr tablet Take 600 mg by mouth daily.   Yes Historical Provider, MD  HYDROcodone-acetaminophen (NORCO/VICODIN) 5-325 MG tablet Take 1 tablet by mouth every 6 (six) hours as needed for moderate pain.   Yes Historical Provider, MD  insulin glargine (LANTUS) 100 UNIT/ML injection Inject 35-43 Units into the skin 2 (two) times daily. 43 UNITS AM 35 UNITS PM   Yes Historical Provider, MD  lidocaine (XYLOCAINE) 5 % ointment Apply 1 application topically 2 (two) times daily as needed. TO FEET   Yes Historical Provider, MD  liver oil-zinc oxide (DESITIN) 40 % ointment Apply 1 application topically as needed for irritation.   Yes Historical Provider, MD  metformin (FORTAMET) 1000 MG (OSM) 24 hr tablet Take 2,000 mg by mouth daily.   Yes Historical Provider, MD  nitroGLYCERIN (NITROSTAT) 0.4 MG SL tablet Place 0.4 mg under the tongue every 5 (five) minutes as needed for chest pain.   Yes Historical Provider, MD  ondansetron (ZOFRAN) 4 MG tablet Take 4 mg by mouth every 8 (eight) hours as needed for nausea or vomiting.   Yes Historical Provider, MD  QUEtiapine (SEROQUEL) 25 MG tablet Take 25 mg by mouth at bedtime. 04/05/15  Yes Historical Provider, MD  ranitidine (ZANTAC) 150 MG tablet Take 150 mg by mouth at bedtime.   Yes Historical Provider, MD  roflumilast (DALIRESP) 500 MCG TABS tablet Take 500 mcg by mouth daily.   Yes Historical Provider, MD  sennosides-docusate sodium (SENOKOT-S) 8.6-50 MG tablet Take 1 tablet by mouth 2 (two) times daily.   Yes Historical Provider, MD  sitaGLIPtin (JANUVIA) 100 MG tablet Take 100 mg by mouth daily.   Yes Historical Provider, MD    sulfamethoxazole-trimethoprim (BACTRIM DS,SEPTRA DS) 800-160 MG tablet Take 1 tablet by mouth 2 (two) times daily.   Yes Historical Provider, MD  tiotropium (SPIRIVA) 18 MCG inhalation capsule Place 18 mcg into inhaler and inhale daily. 2 PUFFS   Yes Historical Provider, MD  vitamin B-12 (CYANOCOBALAMIN) 1000 MCG tablet Take 1,000 mcg by mouth daily.   Yes Historical Provider, MD  Vitamin D, Ergocalciferol, (DRISDOL) 50000 units CAPS capsule Take 50,000 Units by mouth every 30 (thirty) days.   Yes Historical Provider, MD     Allergies Review of patient's allergies indicates no known allergies.  Family History  Problem Relation Age of Onset  . Heart disease Father     Social History Social History  Substance Use  Topics  . Smoking status: Current Some Day Smoker    Packs/day: 0.50    Types: Cigarettes  . Smokeless tobacco: Never Used  . Alcohol use No    Level V caveat: Unable to obtain Review of Systems due to altered mental status      ____________________________________________   PHYSICAL EXAM:  VITAL SIGNS: ED Triage Vitals  Enc Vitals Group     BP 12/28/15 1300 131/86     Pulse Rate 12/28/15 1233 83     Resp 12/28/15 1233 13     Temp 12/28/15 1233 97.4 F (36.3 C)     Temp Source 12/28/15 1233 Oral     SpO2 12/28/15 1233 96 %     Weight 12/28/15 1234 183 lb 12.8 oz (83.4 kg)     Height 12/28/15 1234 6\' 1"  (1.854 m)     Head Circumference --      [place] Flow --      Pain Score --      Pain Loc --      Pain Edu? --      Excl. in [place]? --     Constitutional: Appears sedated, snoring Eyes: Conjunctivae are normal.  Head: Atraumatic.  Mouth/Throat: Mucous membranes are moist.    Cardiovascular: Normal rate, regular rhythm. Grossly normal heart sounds.  Good peripheral circulation. Respiratory: Normal respiratory effort.  No retractions. Lungs CTAB. Gastrointestinal: Soft.No distention.  No CVA tenderness. Genitourinary: deferred Musculoskeletal: No lower  extremity edema.  Warm and well perfused Neurologic:  Unable to examine this patient is sedated Skin:  Skin is warm, dry and intact. No rash noted.   ____________________________________________   LABS (all labs ordered are listed, but only abnormal results are displayed)  Labs Reviewed  CBC - Abnormal; Notable for the following:       Result Value   RBC 4.20 (*)    HCT 39.4 (*)    All other components within normal limits  COMPREHENSIVE METABOLIC PANEL - Abnormal; Notable for the following:    Calcium 8.8 (*)    Albumin 3.3 (*)    ALT 8 (*)    All other components within normal limits  URINALYSIS COMPLETEWITH MICROSCOPIC ([facility] ONLY)   ____________________________________________  EKG  EKG interpreted by me: 12:34 PM 82 BPM Sinus rhthym Normal intervals Normal axis Nonspecific t-wave changes ____________________________________________  RADIOLOGY  CT head unremarkable Chest x-ray unremarkable ____________________________________________   PROCEDURES  Procedure(s) performed: No    Critical Care performed: No ____________________________________________   INITIAL IMPRESSION / ASSESSMENT AND PLAN / ED COURSE  Pertinent labs & imaging results that were available during my care of the patient were reviewed by me and considered in my medical decision making (see chart for details).  Patient presents with initial onset seizure. CT head is unremarkable. Labs are unremarkable.  ----------------------------------------- 3:35 PM on 12/28/2015 -----------------------------------------  Patient has not returned to normal mental status, discussed with neurology who recommends admission, MRI, EEG. Discussed with hospitalist   Clinical Course  Value Comment By Time  Glucose: 72 (Reviewed) Lavonia Drafts, MD 08/11 1519   ____________________________________________   FINAL CLINICAL IMPRESSION(S) / ED DIAGNOSES  Final diagnoses:  Seizure ([place])  Altered  mental status, unspecified altered mental status type      NEW MEDICATIONS STARTED DURING THIS VISIT:  New Prescriptions   No medications on file     Note:  This document was prepared using Dragon voice recognition software and may include unintentional dictation errors.    Lavonia Drafts,  MD 12/28/15 1535    Lavonia Drafts, MD 01/09/16 1047

## 2015-12-28 NOTE — ED Notes (Signed)
[facility] nurse- Jerrell Belfast [phone]  They will arrange transportation back to [facility] for patient if patient discharged before 5 pm.

## 2015-12-29 ENCOUNTER — Observation Stay: Payer: Medicare (Managed Care)

## 2015-12-29 DIAGNOSIS — L8990 Pressure ulcer of unspecified site, unspecified stage: Secondary | ICD-10-CM | POA: Insufficient documentation

## 2015-12-29 DIAGNOSIS — R569 Unspecified convulsions: Secondary | ICD-10-CM

## 2015-12-29 LAB — CBC
HEMATOCRIT: 38 % — AB (ref 40.0–52.0)
Hemoglobin: 13.4 g/dL (ref 13.0–18.0)
MCH: 32.4 pg (ref 26.0–34.0)
MCHC: 35.3 g/dL (ref 32.0–36.0)
MCV: 91.7 fL (ref 80.0–100.0)
Platelets: 238 10*3/uL (ref 150–440)
RBC: 4.14 MIL/uL — ABNORMAL LOW (ref 4.40–5.90)
RDW: 14.6 % — AB (ref 11.5–14.5)
WBC: 11.4 10*3/uL — ABNORMAL HIGH (ref 3.8–10.6)

## 2015-12-29 LAB — BASIC METABOLIC PANEL
Anion gap: 10 (ref 5–15)
BUN: 16 mg/dL (ref 6–20)
CHLORIDE: 103 mmol/L (ref 101–111)
CO2: 27 mmol/L (ref 22–32)
CREATININE: 0.99 mg/dL (ref 0.61–1.24)
Calcium: 9 mg/dL (ref 8.9–10.3)
GFR calc Af Amer: >60 mL/min (ref >60)
GFR calc non Af Amer: >60 mL/min (ref >60)
GLUCOSE: 72 mg/dL (ref 65–99)
POTASSIUM: 4.1 mmol/L (ref 3.5–5.1)
Sodium: 140 mmol/L (ref 135–145)

## 2015-12-29 LAB — GLUCOSE, CAPILLARY
GLUCOSE-CAPILLARY: 76 mg/dL (ref 65–99)
GLUCOSE-CAPILLARY: 129 mg/dL — AB (ref 65–99)
GLUCOSE-CAPILLARY: 119 mg/dL — AB (ref 65–99)
Glucose-Capillary: 92 mg/dL (ref 65–99)

## 2015-12-29 MED ORDER — NYSTATIN 100000 UNIT/ML MT SUSP
5.0000 mL | Freq: Four times a day (QID) | OROMUCOSAL | Status: DC
Start: 2015-12-29 — End: 2015-12-30
  Administered 2015-12-29 – 2015-12-30 (×2): 500000 [IU] via ORAL
  Filled 2015-12-29 (×3): qty 5

## 2015-12-29 MED ORDER — GADOBENATE DIMEGLUMINE 529 MG/ML IV SOLN
17.0000 mL | Freq: Once | INTRAVENOUS | Status: AC | PRN
  Administered 2015-12-29: 17 mL via INTRAVENOUS

## 2015-12-29 MED ORDER — SODIUM CHLORIDE 0.9 % IV BOLUS (SEPSIS)
250.0000 mL | Freq: Once | INTRAVENOUS | Status: AC
  Administered 2015-12-29: 250 mL via INTRAVENOUS

## 2015-12-29 MED ORDER — SODIUM CHLORIDE 0.9 % IV BOLUS (SEPSIS)
1000.0000 mL | Freq: Once | INTRAVENOUS | Status: AC
  Administered 2015-12-29: 1000 mL via INTRAVENOUS

## 2015-12-29 MED ORDER — PHENOL 1.4 % MT LIQD
1.0000 | OROMUCOSAL | Status: DC | PRN
Start: 2015-12-29 — End: 2015-12-30
  Filled 2015-12-29: qty 177

## 2015-12-29 NOTE — Consult Note (Signed)
Reason for Consult:Seizure Referring Physician: Posey Pronto  CC: Seizure  HPI: Jimmy Hill is an 75 y.o. male who presented to the hospital due to altered mental status and a witnessed seizure. Patient apparently was just recently discharge to a skilled nursing facility after having hip surgery. [facility] and while he was there he was witnessed to have a tonic-clonic seizure. He was given 1-1-1/2 mg of Ativan and sent to the ER for further evaluation. Patient appears at baseline today.   Initial blood sugar was low.  Has been altered for the past few days per facility.    Past Medical History:  Diagnosis Date  . Allergic rhinitis   . Anginal pain ([place])   . Arthritis   . Autonomic dysreflexia   . Back pain    CHRONIC LBP,STENOSIS  . CHF (congestive heart failure) ([place])   . Chronic kidney disease    STAGE 3  . COPD (chronic obstructive pulmonary disease) ([place])   . Coronary artery disease   . Depression   . Diabetes ([place])   . Diabetes mellitus without complication ([place])   . DJD (degenerative joint disease)   . Dysphagia   . Edema    FEET/ANKLES  . GERD (gastroesophageal reflux disease)   . Glaucoma   . H/O orthostatic hypotension   . Myocardial infarction ([place])   . Parkinson's disease ([place])   . Peripheral vascular disease ([place])   . Polyneuropathy ([place])   . RLS (restless legs syndrome)   . Sleep apnea   . Smoker   . Strabismic amblyopia   . Tinnitus     Past Surgical History:  Procedure Laterality Date  . ANKLE SURGERY    . APPENDECTOMY    . BACK SURGERY    . CORONARY ANGIOPLASTY     STENT  . CTR    . EYE SURGERY    . HIP PINNING,CANNULATED Left 12/04/2015   Procedure: CANNULATED HIP PINNING;  Surgeon: Thornton Park, MD;  Location: [facility] ORS;  Service: Orthopedics;  Laterality: Left;    Family History  Problem Relation Age of Onset  . Heart disease Father     Social History:  reports that he has been smoking Cigarettes.  He has been  smoking about 0.50 packs per day. He has never used smokeless tobacco. He reports that he does not drink alcohol or use drugs.  No Known Allergies  Medications:  I have reviewed the patient's current medications. Prior to Admission:  Prescriptions Prior to Admission  Medication Sig Dispense Refill Last Dose  . acetaminophen (TYLENOL) 325 MG tablet Take 650 mg by mouth every 4 (four) hours as needed for mild pain or headache.    unknown at unknown  . albuterol (PROVENTIL) (2.5 MG/3ML) 0.083% nebulizer solution Take 2.5 mg by nebulization every 4 (four) hours as needed for wheezing or shortness of breath.   unknown at unknown  . aspirin EC 81 MG tablet Take 81 mg by mouth at bedtime.   unknown at unknown  . atorvastatin (LIPITOR) 80 MG tablet Take 80 mg by mouth at bedtime.   unknown at unknown  . brimonidine-timolol (COMBIGAN) 0.2-0.5 % ophthalmic solution Place 1 drop into both eyes 2 (two) times daily.   unknown at unknown  . buPROPion (WELLBUTRIN XL) 300 MG 24 hr tablet Take 300 mg by mouth daily.   unknown at unknown  . Calcium Carbonate-Vitamin D (CALCIUM 600+D) 600-400 MG-UNIT per tablet Take 1 tablet by mouth daily.  unknown at unknown  . carbidopa-levodopa (SINEMET IR) 25-250 MG tablet Take 1 tablet by mouth 4 (four) times daily.   unknown at unknown  . Carboxymethylcellul-Glycerin (OPTIVE) 0.5-0.9 % SOLN Apply 1-2 drops to eye 3 (three) times daily as needed (for dry eyes).   unknown at unknown  . cetirizine (ZYRTEC) 10 MG tablet Take 10 mg by mouth at bedtime.   unknown at unknown  . clopidogrel (PLAVIX) 75 MG tablet Take 75 mg by mouth daily.   unknown at unknown  . dextrose (GLUTOSE) 40 % GEL Take 1 Tube by mouth.   unknown at unknown  . fluticasone (FLONASE) 50 MCG/ACT nasal spray Place 2 sprays into both nostrils daily as needed for rhinitis.   unknown at unknown  . Fluticasone-Salmeterol (ADVAIR) 500-50 MCG/DOSE AEPB Inhale 1 puff into the lungs 2 (two) times daily.    unknown at  unknown  . gabapentin (NEURONTIN) 600 MG tablet Take 600 mg by mouth. 1 AM 1 SUPPERTIME 2 HS   unknown at unknown  . guaiFENesin (MUCINEX) 600 MG 12 hr tablet Take 600 mg by mouth daily.   unknown at unknown  . HYDROcodone-acetaminophen (NORCO/VICODIN) 5-325 MG tablet Take 1 tablet by mouth every 6 (six) hours as needed for moderate pain.   unknown at unknown  . insulin glargine (LANTUS) 100 UNIT/ML injection Inject 35-43 Units into the skin 2 (two) times daily. 43 UNITS AM 35 UNITS PM   unknown at unknown  . lidocaine (XYLOCAINE) 5 % ointment Apply 1 application topically 2 (two) times daily as needed. TO FEET   unknown at unknown  . liver oil-zinc oxide (DESITIN) 40 % ointment Apply 1 application topically as needed for irritation.   unknown at unknown  . metformin (FORTAMET) 1000 MG (OSM) 24 hr tablet Take 2,000 mg by mouth daily.   unknown at unknown  . nitroGLYCERIN (NITROSTAT) 0.4 MG SL tablet Place 0.4 mg under the tongue every 5 (five) minutes as needed for chest pain.   unknown at unknown  . ondansetron (ZOFRAN) 4 MG tablet Take 4 mg by mouth every 8 (eight) hours as needed for nausea or vomiting.     Marland Kitchen QUEtiapine (SEROQUEL) 25 MG tablet Take 25 mg by mouth at bedtime.   unknown at unknown  . ranitidine (ZANTAC) 150 MG tablet Take 150 mg by mouth at bedtime.   unknown at unknown  . roflumilast (DALIRESP) 500 MCG TABS tablet Take 500 mcg by mouth daily.   unknown at unknown  . sennosides-docusate sodium (SENOKOT-S) 8.6-50 MG tablet Take 1 tablet by mouth 2 (two) times daily.     . sitaGLIPtin (JANUVIA) 100 MG tablet Take 100 mg by mouth daily.   unknown at unknown  . sulfamethoxazole-trimethoprim (BACTRIM DS,SEPTRA DS) 800-160 MG tablet Take 1 tablet by mouth 2 (two) times daily.     Marland Kitchen tiotropium (SPIRIVA) 18 MCG inhalation capsule Place 18 mcg into inhaler and inhale daily. 2 PUFFS   unknown at unknown  . vitamin B-12 (CYANOCOBALAMIN) 1000 MCG tablet Take 1,000 mcg by mouth daily.    unknown at unknown  . Vitamin D, Ergocalciferol, (DRISDOL) 50000 units CAPS capsule Take 50,000 Units by mouth every 30 (thirty) days.   unknown at unknown   Scheduled: . aspirin EC  81 mg Oral QHS  . atorvastatin  80 mg Oral QHS  . brimonidine  1 drop Both Eyes BID   And  . timolol  1 drop Both Eyes BID  . buPROPion  300 mg  Oral Daily  . calcium-vitamin D  1 tablet Oral Daily  . carbidopa-levodopa  1 tablet Oral QID  . clopidogrel  75 mg Oral Daily  . enoxaparin (LOVENOX) injection  40 mg Subcutaneous Q24H  . famotidine  20 mg Oral Daily  . gabapentin  600 mg Oral QHS  . insulin aspart  0-5 Units Subcutaneous QHS  . insulin aspart  0-9 Units Subcutaneous TID WC  . loratadine  10 mg Oral Daily  . mometasone-formoterol  2 puff Inhalation BID  . nystatin  5 mL Oral QID  . QUEtiapine  25 mg Oral QHS  . roflumilast  500 mcg Oral Daily  . senna-docusate  1 tablet Oral BID  . sulfamethoxazole-trimethoprim  1 tablet Oral BID  . tiotropium  18 mcg Inhalation Daily  . vitamin B-12  1,000 mcg Oral Daily    ROS: History obtained from the patient  General ROS: negative for - chills, fatigue, fever, night sweats, weight gain or weight loss Psychological ROS: negative for - behavioral disorder, hallucinations, memory difficulties, mood swings or suicidal ideation Ophthalmic ROS: negative for - blurry vision, double vision, eye pain or loss of vision ENT ROS: negative for - epistaxis, nasal discharge, oral lesions, sore throat, tinnitus or vertigo Allergy and Immunology ROS: negative for - hives or itchy/watery eyes Hematological and Lymphatic ROS: negative for - bleeding problems, bruising or swollen lymph nodes Endocrine ROS: negative for - galactorrhea, hair pattern changes, polydipsia/polyuria or temperature intolerance Respiratory ROS: negative for - cough, hemoptysis, shortness of breath or wheezing Cardiovascular ROS: negative for - chest pain, dyspnea on exertion, edema or irregular  heartbeat Gastrointestinal ROS: negative for - abdominal pain, diarrhea, hematemesis, nausea/vomiting or stool incontinence Genito-Urinary ROS: negative for - dysuria, hematuria, incontinence or urinary frequency/urgency Musculoskeletal ROS: negative for - joint swelling or muscular weakness Neurological ROS: as noted in HPI Dermatological ROS: negative for rash and skin lesion changes  Physical Examination: Blood pressure (!) 145/74, pulse 88, temperature 99 F (37.2 C), temperature source Oral, resp. rate 18, height 6\' 1"  (1.854 m), weight 83.4 kg (183 lb 12.8 oz), SpO2 94 %.  HEENT-  Normocephalic, no lesions, without obvious abnormality.  Normal external eye and conjunctiva.  Normal TM's bilaterally.  Normal auditory canals and external ears. Normal external nose, mucus membranes and septum.  Normal pharynx. Cardiovascular- S1, S2 normal, pulses palpable throughout   Lungs- chest clear, no wheezing, rales, normal symmetric air entry Abdomen- soft, non-tender; bowel sounds normal; no masses,  no organomegaly Extremities- no edema Lymph-no adenopathy palpable Musculoskeletal-no joint tenderness, deformity or swelling Skin-warm and dry, no hyperpigmentation, vitiligo, or suspicious lesions  Neurological Examination Mental Status: Alert, oriented, thought content appropriate.  Speech fluent without evidence of aphasia.  Able to follow 3 step commands without difficulty. Cranial Nerves: II: Discs flat bilaterally; Visual fields grossly normal, pupils equal, round, reactive to light and accommodation III,IV, VI: ptosis not present, extra-ocular motions intact bilaterally V,VII: smile symmetric, facial light touch sensation normal bilaterally VIII: hearing normal bilaterally IX,X: gag reflex present XI: bilateral shoulder shrug XII: midline tongue extension Motor: Right : Upper extremity   5/5    Left:     Upper extremity   5/5  Lower extremity   5/5     Lower extremity   5/5 Tone and  bulk:normal tone throughout; no atrophy noted Sensory: Pinprick and light touch intact decreased on the right Deep Tendon Reflexes: 2+ and symmetric with absent AJ's bilaterally Plantars: Right: mute   Left:  mute Cerebellar: Normal finger-to-nose testing bilaterally Gait: not tested due to safety concerns   Laboratory Studies:   Basic Metabolic Panel:  Recent Labs Lab 12/28/15 1240 12/29/15 0425  NA 139 140  K 3.9 4.1  CL 103 103  CO2 29 27  GLUCOSE 72 72  BUN 19 16  CREATININE 1.09 0.99  CALCIUM 8.8* 9.0  MG 1.7  --   PHOS 3.6  --     Liver Function Tests:  Recent Labs Lab 12/28/15 1240  AST 22  ALT 8*  ALKPHOS 104  BILITOT 0.8  PROT 6.5  ALBUMIN 3.3*   No results for input(s): LIPASE, AMYLASE in the last 168 hours. No results for input(s): AMMONIA in the last 168 hours.  CBC:  Recent Labs Lab 12/28/15 1240 12/29/15 0425  WBC 10.6 11.4*  HGB 13.5 13.4  HCT 39.4* 38.0*  MCV 93.8 91.7  PLT 248 238    Cardiac Enzymes: No results for input(s): CKTOTAL, CKMB, CKMBINDEX, TROPONINI in the last 168 hours.  BNP: Invalid input(s): POCBNP  CBG:  Recent Labs Lab 12/28/15 2121 12/28/15 2200 12/29/15 0727 12/29/15 1136  GLUCAP 65 193* 45 92    Microbiology: Results for orders placed or performed during the hospital encounter of 12/28/15  MRSA PCR Screening     Status: None   Collection Time: 12/28/15  6:27 PM  Result Value Ref Range Status   MRSA by PCR NEGATIVE NEGATIVE Final    Comment:        The GeneXpert MRSA Assay (FDA approved for NASAL specimens only), is one component of a comprehensive MRSA colonization surveillance program. It is not intended to diagnose MRSA infection nor to guide or monitor treatment for MRSA infections.     Coagulation Studies: No results for input(s): LABPROT, INR in the last 72 hours.  Urinalysis:  Recent Labs Lab 12/28/15 1603  COLORURINE YELLOW*  LABSPEC 1.016  PHURINE 6.0  GLUCOSEU NEGATIVE   HGBUR NEGATIVE  BILIRUBINUR NEGATIVE  KETONESUR TRACE*  PROTEINUR NEGATIVE  NITRITE NEGATIVE  LEUKOCYTESUR 3+*    Lipid Panel:     Component Value Date/Time   CHOL 96 12/10/2014 0712   CHOL 131 08/16/2013 0434   TRIG 195 (H) 12/10/2014 0712   TRIG 597 (H) 08/16/2013 0434   HDL 26 (L) 12/10/2014 0712   HDL 19 (L) 08/16/2013 0434   CHOLHDL 3.7 12/10/2014 0712   VLDL 39 12/10/2014 0712   VLDL SEE COMMENT 08/16/2013 0434   LDLCALC 31 12/10/2014 0712   LDLCALC SEE COMMENT 08/16/2013 0434    HgbA1C:  Lab Results  Component Value Date   HGBA1C 10.7 (H) 02/05/2015    Urine Drug Screen:  No results found for: LABOPIA, COCAINSCRNUR, LABBENZ, AMPHETMU, THCU, LABBARB  Alcohol Level: No results for input(s): ETH in the last 168 hours.  Other results: EKG: sinus rhythm at 82 bpm.  Imaging: Ct Head Wo Contrast  Result Date: 12/28/2015 CLINICAL DATA:  New onset seizures today.  Fell 2 days ago. EXAM: CT HEAD WITHOUT CONTRAST TECHNIQUE: Contiguous axial images were obtained from the base of the skull through the vertex without intravenous contrast. COMPARISON:  Head CT 06/13/2014 FINDINGS: Stable age related cerebral atrophy, ventriculomegaly and periventricular white matter disease. No extra-axial fluid collections are identified. No CT findings for acute hemispheric infarction or intracranial hemorrhage. No mass lesions. The brainstem and cerebellum are normal. No acute skull fracture. No bone lesions. The paranasal sinuses and mastoid air cells are clear. The globes are intact. IMPRESSION:  Stable age related cerebral atrophy, ventriculomegaly and periventricular white matter disease. No acute intracranial findings or skull fracture. Electronically Signed   By: Marijo Sanes M.D.   On: 12/28/2015 13:42   Dg Chest Port 1 View  Result Date: 12/28/2015 CLINICAL DATA:  Seizures EXAM: PORTABLE CHEST 1 VIEW COMPARISON:  12/05/2015 FINDINGS: The heart size and mediastinal contours are within  normal limits. Both lungs are clear. The visualized skeletal structures are unremarkable. IMPRESSION: No active disease. Electronically Signed   By: Inez Catalina M.D.   On: 12/28/2015 15:35     Assessment/Plan: 75 year old male presenting with new onset seizure activity.  Blood sugar as low.  Magnesium, calcium and phosphorus are normal.  Just initiated treatment for UTI.  Head CT personally reviewed and shows no acute changes.  Further imagine recommended.    Recommendations: 1.  MRI of the brain with and without contrast 2.  Seizure likely provoked.  AED therapy not indicated at this time.   3.  Agree with continued treatment for UTI 4.  Would consider discontinuation of Wellbutrin 5.  EEG.  If unable to be performed during this hospitalization would schedule as an outpatient 6.  Continue seizure precautions 7.  Ativan prn seizure activity  Alexis Goodell, MD Neurology [phone] 12/29/2015, 12:52 PM

## 2015-12-29 NOTE — NC FL2 (Signed)
[place] LEVEL OF CARE SCREENING TOOL     IDENTIFICATION  Patient Name: Jimmy Hill Birthdate: 1940/06/21 Sex: male Admission Date (Current Location): 12/28/2015  [facility] and [place] Number:  [name]  ([id number] [facility]) Facility and Address:  [facility], [address], [place], [place] [address]      Provider Number: [id number]  Attending Physician Name and Address:  Fritzi Mandes, MD  Relative Name and Phone Number:       Current Level of Care: Hospital Recommended Level of Care: [place] Prior Approval Number:    Date Approved/Denied:   PASRR Number:  ( [id number] )  Discharge Plan: SNF    Current Diagnoses: Patient Active Problem List   Diagnosis Date Noted  . Pressure ulcer 12/29/2015  . Altered mental status 12/28/2015  . Fracture of femoral neck, left ([place]) 12/03/2015  . CAD (coronary artery disease) 12/03/2015  . Sleep apnea 12/03/2015  . Femoral neck fracture, left, closed, initial encounter 12/03/2015  . COPD (chronic obstructive pulmonary disease) ([place]) 02/05/2015  . Acute bronchitis 02/05/2015  . Type 2 diabetes mellitus ([place]) 02/05/2015  . Chest pain 12/09/2014    Orientation RESPIRATION BLADDER Height & Weight     Self, Time, Situation, Place  Normal Incontinent Weight: 183 lb 12.8 oz (83.4 kg) Height:  6\' 1"  (185.4 cm)  BEHAVIORAL SYMPTOMS/MOOD NEUROLOGICAL BOWEL NUTRITION STATUS   (none) Convulsions/Seizures Continent Diet (Diet: Soft )  AMBULATORY STATUS COMMUNICATION OF NEEDS Skin   Extensive Assist Verbally Surgical wounds, PU Stage and Appropriate Care (Pressure Ulcer Stage 1: Coccyx. )                       Personal Care Assistance Level of Assistance  Bathing, Feeding, Dressing Bathing Assistance: Limited assistance Feeding assistance: Independent Dressing Assistance: Limited assistance     Functional Limitations Info  Sight, Hearing, Speech Sight Info: Adequate Hearing  Info: Adequate Speech Info: Adequate    SPECIAL CARE FACTORS FREQUENCY  PT (By licensed PT), OT (By licensed OT)     PT Frequency:  (5) OT Frequency:  (5)            Contractures      Additional Factors Info  Code Status, Allergies, Insulin Sliding Scale Code Status Info:  (Full Code. ) Allergies Info:  (No Known Allergies. )   Insulin Sliding Scale Info:  (NovoLog Insulin Injections 3 times per day. )       Current Medications (12/29/2015):  This is the current hospital active medication list Current Facility-Administered Medications  Medication Dose Route Frequency Provider Last Rate Last Dose  . acetaminophen (TYLENOL) tablet 650 mg  650 mg Oral Q6H PRN Henreitta Leber, MD       Or  . acetaminophen (TYLENOL) suppository 650 mg  650 mg Rectal Q6H PRN Henreitta Leber, MD      . albuterol (PROVENTIL) (2.5 MG/3ML) 0.083% nebulizer solution 2.5 mg  2.5 mg Nebulization Q4H PRN Henreitta Leber, MD      . aspirin EC tablet 81 mg  81 mg Oral QHS Henreitta Leber, MD      . atorvastatin (LIPITOR) tablet 80 mg  80 mg Oral QHS Henreitta Leber, MD      . brimonidine (ALPHAGAN) 0.2 % ophthalmic solution 1 drop  1 drop Both Eyes BID Henreitta Leber, MD   1 drop at 12/29/15 1222   And  . timolol (TIMOPTIC) 0.5 % ophthalmic solution 1  drop  1 drop Both Eyes BID Henreitta Leber, MD   1 drop at 12/29/15 1223  . buPROPion (WELLBUTRIN XL) 24 hr tablet 300 mg  300 mg Oral Daily Henreitta Leber, MD   300 mg at 12/29/15 1221  . calcium-vitamin D (OSCAL WITH D) 500-200 MG-UNIT per tablet 1 tablet  1 tablet Oral Daily Henreitta Leber, MD      . carbidopa-levodopa (SINEMET IR) 25-250 MG per tablet immediate release 1 tablet  1 tablet Oral QID Henreitta Leber, MD   1 tablet at 12/29/15 1223  . clopidogrel (PLAVIX) tablet 75 mg  75 mg Oral Daily Henreitta Leber, MD   75 mg at 12/29/15 1222  . enoxaparin (LOVENOX) injection 40 mg  40 mg Subcutaneous Q24H Henreitta Leber, MD      . famotidine (PEPCID)  tablet 20 mg  20 mg Oral Daily Henreitta Leber, MD   20 mg at 12/29/15 1222  . fluticasone (FLONASE) 50 MCG/ACT nasal spray 2 spray  2 spray Each Nare Daily PRN Henreitta Leber, MD      . gabapentin (NEURONTIN) capsule 600 mg  600 mg Oral QHS Henreitta Leber, MD      . insulin aspart (novoLOG) injection 0-5 Units  0-5 Units Subcutaneous QHS Henreitta Leber, MD      . insulin aspart (novoLOG) injection 0-9 Units  0-9 Units Subcutaneous TID WC Henreitta Leber, MD      . loratadine (CLARITIN) tablet 10 mg  10 mg Oral Daily Henreitta Leber, MD      . mometasone-formoterol (DULERA) 200-5 MCG/ACT inhaler 2 puff  2 puff Inhalation BID Henreitta Leber, MD      . nitroGLYCERIN (NITROSTAT) SL tablet 0.4 mg  0.4 mg Sublingual Q5 min PRN Henreitta Leber, MD      . nystatin (MYCOSTATIN) 100000 UNIT/ML suspension 500,000 Units  5 mL Oral QID Fritzi Mandes, MD      . ondansetron (ZOFRAN) tablet 4 mg  4 mg Oral Q6H PRN Henreitta Leber, MD       Or  . ondansetron (ZOFRAN) injection 4 mg  4 mg Intravenous Q6H PRN Henreitta Leber, MD      . phenol (CHLORASEPTIC) mouth spray 1 spray  1 spray Mouth/Throat PRN Fritzi Mandes, MD      . QUEtiapine (SEROQUEL) tablet 25 mg  25 mg Oral QHS Henreitta Leber, MD      . roflumilast (DALIRESP) tablet 500 mcg  500 mcg Oral Daily Henreitta Leber, MD   500 mcg at 12/29/15 1221  . senna-docusate (Senokot-S) tablet 1 tablet  1 tablet Oral BID Henreitta Leber, MD      . sulfamethoxazole-trimethoprim (BACTRIM DS,SEPTRA DS) 800-160 MG per tablet 1 tablet  1 tablet Oral BID Henreitta Leber, MD   1 tablet at 12/29/15 1222  . tiotropium (SPIRIVA) inhalation capsule 18 mcg  18 mcg Inhalation Daily Henreitta Leber, MD      . vitamin B-12 (CYANOCOBALAMIN) tablet 1,000 mcg  1,000 mcg Oral Daily Henreitta Leber, MD         Discharge Medications: Please see discharge summary for a list of discharge medications.  Relevant Imaging Results:  Relevant Lab Results:   Additional Information   (SSN: [id number])  Taurean Ju, Veronia Beets, LCSW

## 2015-12-29 NOTE — Clinical Social Work Note (Signed)
Clinical Social Work Assessment  Patient Details  Name: Jimmy Hill MRN: [id number] Date of Birth: 16-May-1941  Date of referral:  12/29/15               Reason for consult:  Facility Placement, Other (Comment Required) (From [facility] for STR. )                Permission sought to share information with:  Chartered certified accountant granted to share information::  Yes, Verbal Permission Granted  Name::      [facility]   Agency::   [facility]   Relationship::     Contact Information:     Housing/Transportation Living arrangements for the past 2 months:  Jimmy Hill, [place] of Information:  Patient Patient Interpreter Needed:  None Criminal Activity/Legal Involvement Pertinent to Current Situation/Hospitalization:  No - Comment as needed Significant Relationships:  Adult Children, Community Support Lives with:  Self, Facility Resident Do you feel safe going back to the place where you live?  Yes Need for family participation in patient care:  Yes (Comment)  Care giving concerns:  Patient is followed by [facility] and was at [facility] for [facility].    Social Worker assessment / plan:  Holiday representative (CSW) received consult that patient is from a facility. Per Jimmy Hill [facility] liaison patient is from [facility] for STR and can return. Patient is followed by [facility]. CSW met with patient to discuss D/C plan. Patient was pleasant and alert and oriented. Patient reported that he lives at home and is currently at [facility] for rehab. Patient is agreeable to return to [facility]. FL2 complete and faxed out. CSW attempted to call patient's daughter Jimmy Hill however the call did not go through. CSW will continue to follow and assist as needed.     Employment status:  Retired Forensic scientist:  Other (Comment Required), Medicaid In [place] ([place]) PT Recommendations:  Not assessed at this time Information / Referral to community resources:  [place]  Patient/Family's Response to care:  Patient is agreeable to return to [facility].   Patient/Family's Understanding of and Emotional Response to Diagnosis, Current Treatment, and Prognosis:  Patient was very pleasant and thanked CSW for visit.   Emotional Assessment Appearance:  Appears stated age Attitude/Demeanor/Rapport:    Affect (typically observed):  Accepting, Adaptable, Pleasant Orientation:  Oriented to Self, Oriented to Place, Oriented to  Time, Oriented to Situation Alcohol / Substance use:  Not Applicable Psych involvement (Current and /or in the community):  No (Comment)  Discharge Needs  Concerns to be addressed:  Discharge Planning Concerns Readmission within the last 30 days:  No Current discharge risk:  Dependent with Mobility Barriers to Discharge:  Continued Medical Work up   [employer], Veronia Beets, LCSW 12/29/2015, 4:39 PM

## 2015-12-29 NOTE — Care Management Note (Signed)
Case Management Note  Patient Details  Name: AKBAR SPARACO MRN: [id number] Date of Birth: 10/21/1940  Subjective/Objective:    74yo Mr Eeshan Ginty was admitted after 2 witnessed seizures while attending the [facility] on 12/28/15. He was recently discharged from [facility] after a left hip pinning at [facility] on 12/03/15. Current diagnosis are seizures and UTI. Lives alone. Hx: Parkinsons, CKD, CHF, COPD, Diabetes. Living alone may no longer be an option and [facility] is following for placement needs. Case management will also follow for discharge planning.                 Action/Plan:   Expected Discharge Date:                  Expected Discharge Plan:     In-House Referral:     Discharge planning Services     Post Acute Care Choice:    Choice offered to:     DME Arranged:    DME Agency:     HH Arranged:    HH Agency:     Status of Service:     If discussed at [employer] of Stay Meetings, dates discussed:    Additional Comments:  Amarian Botero A, RN 12/29/2015, 2:19 PM

## 2015-12-29 NOTE — Progress Notes (Signed)
Dr patel  Re: notified re: bp 80/60. md reports adm 250cc ns bolus for hypotension

## 2015-12-29 NOTE — Progress Notes (Signed)
[facility] [place] NAME: Jimmy Hill    MR#:  [id number]  DATE OF BIRTH:  1941/05/11  SUBJECTIVE:   Waking up some. Wants to eat food. Some confusion No seizures here REVIEW OF SYSTEMS:   Review of Systems  Unable to perform ROS: Medical condition   Tolerating Diet: Tolerating PT:   DRUG ALLERGIES:  No Known Allergies  VITALS:  Blood pressure (!) 145/74, pulse 88, temperature 99 F (37.2 C), temperature source Oral, resp. rate 18, height 6\' 1"  (1.854 m), weight 83.4 kg (183 lb 12.8 oz), SpO2 94 %.  PHYSICAL EXAMINATION:   Physical Exam  GENERAL:  75 y.o.-year-old patient lying in the bed with no acute distress.  EYES: Pupils equal, round, reactive to light and accommodation. No scleral icterus. Extraocular muscles intact.  HEENT: Head atraumatic, normocephalic. Oropharynx and nasopharynx clear.  NECK:  Supple, no jugular venous distention. No thyroid enlargement, no tenderness.  LUNGS: Normal breath sounds bilaterally, no wheezing, rales, rhonchi. No use of accessory muscles of respiration.  CARDIOVASCULAR: S1, S2 normal. No murmurs, rubs, or gallops.  ABDOMEN: Soft, nontender, nondistended. Bowel sounds present. No organomegaly or mass.  EXTREMITIES: No cyanosis, clubbing or edema b/l.    NEUROLOGIC: no focal deficit PSYCHIATRIC:  patient is alert  But confused SKIN: No obvious rash, lesion, or ulcer.   LABORATORY PANEL:  CBC  Recent Labs Lab 12/29/15 0425  WBC 11.4*  HGB 13.4  HCT 38.0*  PLT 238    Chemistries   Recent Labs Lab 12/28/15 1240 12/29/15 0425  NA 139 140  K 3.9 4.1  CL 103 103  CO2 29 27  GLUCOSE 72 72  BUN 19 16  CREATININE 1.09 0.99  CALCIUM 8.8* 9.0  MG 1.7  --   AST 22  --   ALT 8*  --   ALKPHOS 104  --   BILITOT 0.8  --    Cardiac Enzymes No results for input(s): TROPONINI in the last 168 hours. RADIOLOGY:  Ct Head Wo Contrast  Result Date: 12/28/2015 CLINICAL DATA:   New onset seizures today.  Fell 2 days ago. EXAM: CT HEAD WITHOUT CONTRAST TECHNIQUE: Contiguous axial images were obtained from the base of the skull through the vertex without intravenous contrast. COMPARISON:  Head CT 06/13/2014 FINDINGS: Stable age related cerebral atrophy, ventriculomegaly and periventricular white matter disease. No extra-axial fluid collections are identified. No CT findings for acute hemispheric infarction or intracranial hemorrhage. No mass lesions. The brainstem and cerebellum are normal. No acute skull fracture. No bone lesions. The paranasal sinuses and mastoid air cells are clear. The globes are intact. IMPRESSION: Stable age related cerebral atrophy, ventriculomegaly and periventricular white matter disease. No acute intracranial findings or skull fracture. Electronically Signed   By: Marijo Sanes M.D.   On: 12/28/2015 13:42   Dg Chest Port 1 View  Result Date: 12/28/2015 CLINICAL DATA:  Seizures EXAM: PORTABLE CHEST 1 VIEW COMPARISON:  12/05/2015 FINDINGS: The heart size and mediastinal contours are within normal limits. Both lungs are clear. The visualized skeletal structures are unremarkable. IMPRESSION: No active disease. Electronically Signed   By: Inez Catalina M.D.   On: 12/28/2015 15:35   ASSESSMENT AND PLAN:  75 year old male with past medical history of Parkinson's disease, hypertension, chronic kidney disease stage III, restless leg syndrome, diabetes, GERD, glaucoma, history of coronary artery disease who came to the hospital due to a witnessed seizure and altered mental status.  1. Altered  mental status-this is metabolic encephalopathy secondary to the seizure and postictal state. -CT head shows no acute pathology. No evidence of acute infectious source.  2. Seizures-new-onset for the patient. Patient apparently had a witnessed tonic-clonic seizure today. -CT head is negative for any acute pathology. -I will get an MRI of the brain with and without  contrast. -Get an EEG, get a neurology consult and discussed the case with Dr. Doy Mince.  3. Diabetes type 2 without complication-we'll place the patient on sliding scale insulin.  4. History of Parkinson's disease-continue Sinemet.  5. Glaucoma-continue Combigan eyedrops.  6. COPD-no acute exacerbation, continue Spiriva, Daliresp, albuterol nebulizers as needed.  7. Depression-continue Wellbutrin.  8. Hyperlipidemia-continue atorvastatin.  Case discussed with Care Management/Social Worker. Management plans discussed with the patient, family and they are in agreement.  CODE STATUS: full  DVT Prophylaxis: lovenox TOTAL TIME TAKING CARE OF THIS PATIENT33minutes.  >50% time spent on counselling and coordination of care  POSSIBLE D/C IN 1-2 DAYS, DEPENDING ON CLINICAL CONDITION.  Note: This dictation was prepared with Dragon dictation along with smaller phrase technology. Any transcriptional errors that result from this process are unintentional.  Epifanio Labrador M.D on 12/29/2015 at 12:12 PM  Between 7am to 6pm - Pager - [phone]  After 6pm go to [email or web address] - password [email or web address] [facility]  Office  [phone]  CC: Primary care physician; Gareth Morgan, MD

## 2015-12-29 NOTE — Progress Notes (Signed)
Pts manual BP 80/60.pt asymptomatic.  MD Posey Pronto notified. No orders received at this time

## 2015-12-30 DIAGNOSIS — R569 Unspecified convulsions: Secondary | ICD-10-CM | POA: Diagnosis not present

## 2015-12-30 LAB — GLUCOSE, CAPILLARY
Glucose-Capillary: 83 mg/dL (ref 65–99)
Glucose-Capillary: 148 mg/dL — ABNORMAL HIGH (ref 65–99)

## 2015-12-30 MED ORDER — METFORMIN HCL ER 500 MG PO TB24: 500.0000 mg | ORAL_TABLET | Freq: Every day | ORAL | 1 refills | Status: DC

## 2015-12-30 NOTE — Progress Notes (Signed)
EMS here to transport pt. 

## 2015-12-30 NOTE — Progress Notes (Signed)
Subjective: Patient awake and alert.  No seizure activity noted since admission.    Objective: Current vital signs: BP 110/63 (BP Location: Left Arm)   Pulse 72   Temp 97.9 F (36.6 C) (Oral)   Resp 18   Ht 6\' 1"  (1.854 m)   Wt 83.4 kg (183 lb 12.8 oz)   SpO2 96%   BMI 24.25 kg/m  Vital signs in last 24 hours: Temp:  [97.9 F (36.6 C)-98.3 F (36.8 C)] 97.9 F (36.6 C) (08/13 0737) Pulse Rate:  [71-115] 72 (08/13 0737) Resp:  [18-20] 18 (08/12 1948) BP: (70-116)/(45-78) 110/63 (08/13 0737) SpO2:  [95 %-97 %] 96 % (08/13 0737)  Intake/Output from previous day: 08/12 0701 - 08/13 0700 In: 480 [P.O.:480] Out: 200 [Urine:200] Intake/Output this shift: Total I/O In: 240 [P.O.:240] Out: 120 [Urine:120] Nutritional status: DIET SOFT Room service appropriate? Yes; Fluid consistency: Thin Diet - low sodium heart healthy  Neurologic Exam: Mental Status: Alert, oriented, thought content appropriate.  Speech fluent without evidence of aphasia.  Able to follow 3 step commands without difficulty. Cranial Nerves: II: Discs flat bilaterally; Visual fields grossly normal, pupils equal, round, reactive to light and accommodation III,IV, VI: ptosis not present, extra-ocular motions intact bilaterally V,VII: smile symmetric, facial light touch sensation normal bilaterally VIII: hearing normal bilaterally IX,X: gag reflex present XI: bilateral shoulder shrug XII: midline tongue extension Motor: Right :            Upper extremity   5/5                                                                              Left:     Upper extremity   5/5                       Lower extremity   5/5                                                                                           Lower extremity   5/5 Tone and bulk:normal tone throughout; no atrophy noted   Lab Results: Basic Metabolic Panel:  Recent Labs Lab 12/28/15 1240 12/29/15 0425  NA 139 140  K 3.9 4.1  CL 103 103  CO2 29 27   GLUCOSE 72 72  BUN 19 16  CREATININE 1.09 0.99  CALCIUM 8.8* 9.0  MG 1.7  --   PHOS 3.6  --     Liver Function Tests:  Recent Labs Lab 12/28/15 1240  AST 22  ALT 8*  ALKPHOS 104  BILITOT 0.8  PROT 6.5  ALBUMIN 3.3*   No results for input(s): LIPASE, AMYLASE in the last 168 hours. No results for input(s): AMMONIA in the last 168 hours.  CBC:  Recent Labs Lab 12/28/15 1240 12/29/15 0425  WBC 10.6 11.4*  HGB 13.5 13.4  HCT 39.4* 38.0*  MCV 93.8 91.7  PLT 248 238    Cardiac Enzymes: No results for input(s): CKTOTAL, CKMB, CKMBINDEX, TROPONINI in the last 168 hours.  Lipid Panel: No results for input(s): CHOL, TRIG, HDL, CHOLHDL, VLDL, LDLCALC in the last 168 hours.  CBG:  Recent Labs Lab 12/29/15 0727 12/29/15 1136 12/29/15 1542 12/29/15 2154 12/30/15 0729  GLUCAP 76 92 129* 119* 60    Microbiology: Results for orders placed or performed during the hospital encounter of 12/28/15  MRSA PCR Screening     Status: None   Collection Time: 12/28/15  6:27 PM  Result Value Ref Range Status   MRSA by PCR NEGATIVE NEGATIVE Final    Comment:        The GeneXpert MRSA Assay (FDA approved for NASAL specimens only), is one component of a comprehensive MRSA colonization surveillance program. It is not intended to diagnose MRSA infection nor to guide or monitor treatment for MRSA infections.     Coagulation Studies: No results for input(s): LABPROT, INR in the last 72 hours.  Imaging: Ct Head Wo Contrast  Result Date: 12/28/2015 CLINICAL DATA:  New onset seizures today.  Fell 2 days ago. EXAM: CT HEAD WITHOUT CONTRAST TECHNIQUE: Contiguous axial images were obtained from the base of the skull through the vertex without intravenous contrast. COMPARISON:  Head CT 06/13/2014 FINDINGS: Stable age related cerebral atrophy, ventriculomegaly and periventricular white matter disease. No extra-axial fluid collections are identified. No CT findings for acute  hemispheric infarction or intracranial hemorrhage. No mass lesions. The brainstem and cerebellum are normal. No acute skull fracture. No bone lesions. The paranasal sinuses and mastoid air cells are clear. The globes are intact. IMPRESSION: Stable age related cerebral atrophy, ventriculomegaly and periventricular white matter disease. No acute intracranial findings or skull fracture. Electronically Signed   By: Marijo Sanes M.D.   On: 12/28/2015 13:42   Mr Jeri Cos [id number] Contrast  Result Date: 12/29/2015 CLINICAL DATA:  Seizure EXAM: MRI HEAD WITHOUT AND WITH CONTRAST TECHNIQUE: Multiplanar, multiecho pulse sequences of the brain and surrounding structures were obtained without and with intravenous contrast. CONTRAST:  26mL MULTIHANCE GADOBENATE DIMEGLUMINE 529 MG/ML IV SOLN COMPARISON:  CT head 12/28/2015 FINDINGS: Moderate atrophy. Moderate ventricular enlargement consistent with atrophy. Negative for acute infarct. Mild chronic microvascular ischemic change in the white matter. Small chronic infarct right cerebellum. Brainstem intact. Negative for mass or edema. No shift of the midline structures. No fluid collection Postcontrast imaging demonstrates enhancing vessel in the left frontal lobe compatible with developmental venous anomaly. No enhancing mass lesion. Venous sinuses enhance normally and are patent. Paranasal sinuses clear.  Normal orbit bilaterally. IMPRESSION: Atrophy and chronic microvascular ischemia.  No acute infarct Developmental venous anomaly left frontal lobe, incidental finding. Electronically Signed   By: Franchot Gallo M.D.   On: 12/29/2015 15:59   Dg Chest Port 1 View  Result Date: 12/28/2015 CLINICAL DATA:  Seizures EXAM: PORTABLE CHEST 1 VIEW COMPARISON:  12/05/2015 FINDINGS: The heart size and mediastinal contours are within normal limits. Both lungs are clear. The visualized skeletal structures are unremarkable. IMPRESSION: No active disease. Electronically Signed   By: Inez Catalina M.D.   On: 12/28/2015 15:35    Medications:  I have reviewed the patient's current medications. Scheduled: . aspirin EC  81 mg Oral QHS  . atorvastatin  80 mg Oral QHS  . brimonidine  1 drop Both Eyes BID   And  . timolol  1  drop Both Eyes BID  . calcium-vitamin D  1 tablet Oral Daily  . carbidopa-levodopa  1 tablet Oral QID  . clopidogrel  75 mg Oral Daily  . enoxaparin (LOVENOX) injection  40 mg Subcutaneous Q24H  . famotidine  20 mg Oral Daily  . gabapentin  600 mg Oral QHS  . insulin aspart  0-5 Units Subcutaneous QHS  . insulin aspart  0-9 Units Subcutaneous TID WC  . loratadine  10 mg Oral Daily  . mometasone-formoterol  2 puff Inhalation BID  . nystatin  5 mL Oral QID  . QUEtiapine  25 mg Oral QHS  . roflumilast  500 mcg Oral Daily  . senna-docusate  1 tablet Oral BID  . sulfamethoxazole-trimethoprim  1 tablet Oral BID  . tiotropium  18 mcg Inhalation Daily  . vitamin B-12  1,000 mcg Oral Daily    Assessment/Plan: No further seizure activity noted.  Wellbutrin discontinued.  MRI of the brain with and without contrast personally reviewed and shows no acute changes.  A venous anomaly is noted in the left frontal lobe.  No associated edema.  Antiepileptic therapy not indicated at this time.  No further inpatient neurological evaluation recommended.    Case discussed with Dr. Posey Pronto.     LOS: 0 days   Alexis Goodell, MD Neurology [phone] 12/30/2015  11:45 AM

## 2015-12-30 NOTE — Evaluation (Signed)
Physical Therapy Evaluation Patient Details Name: Jimmy Hill MRN: [id number] DOB: 06-06-1940 Today's Date: 12/30/2015   History of Present Illness  presented to ER status post witnessed seizure, AMS; admitted with metabolic encephalopathy and post-ictal state post seizures.  Of note patient with recent L hip ORIF (12/03/15), NWB at that time.  Attempted to contact [facility] nurse and rehab facility to confirm current [place] status to L LE; both unavailable.  Patient unable to accurately relate; attempted to maintain NWB L LE during evaluation until confirmation received otherwise.  Clinical Impression  Patient alert and responsive, appears intermittently confused at times.  Unable to clearly relate current L LE WBing status to therapist, and additional resources not available to confirm (if remains in hospital, may benefit from ortho consult to confirm).  Maintained NWB L LE throughout evaluation until clarified otherwise.  Currently requiring heavy mod assist for sit/stand and 2-3 steps forward and backward with RW.  Very poor ability to maintain NWB L LE; heavy posterior trunk lean and very high fall risk (due to poor/absent spontaneous balance reactions).  Unsafe to attempt additional ambulation distance at this time; recommend continued use of manual WC as primary mobility until L LE WBing status upgraded. Would benefit from skilled PT to address above deficits and promote optimal return to PLOF; recommend transition to STR upon discharge from acute hospitalization.     Follow Up Recommendations SNF    Equipment Recommendations       Recommendations for Other Services       Precautions / Restrictions Precautions Precautions: Fall Restrictions Weight Bearing Restrictions: Yes LLE Weight Bearing: Non weight bearing Other Position/Activity Restrictions: until clarified further       Mobility  Bed Mobility Overal bed mobility: Needs Assistance Bed Mobility: Supine to Sit;Sit to  Supine     Supine to sit: Min assist Sit to supine: Supervision      Transfers Overall transfer level: Needs assistance Equipment used: Rolling walker (2 wheeled) Transfers: Sit to/from Stand Sit to Stand: Mod assist         General transfer comment: very heavy posterior trunk lean with minimal/no self-righting reations  Ambulation/Gait Ambulation/Gait assistance: Mod assist;Max assist Ambulation Distance (Feet): 3 Feet Assistive device: Rolling walker (2 wheeled)       General Gait Details: hop-to gait pattern, attempting to maintain L LE in NWB.  Patient with significant difficulty maintaining NWB L LE, increasing level of assist required and worsening standing balance.  Very high fall risk.  Recommend continued use of manual WC as primary mobility until [place] status upgraded.  Additional trial completed (3-4 steps), but patient unable to maintain NWB, esp with fatigue; continued efforts deferred  Stairs            Wheelchair Mobility    Modified Rankin (Stroke Patients Only)       Balance Overall balance assessment: Needs assistance Sitting-balance support: No upper extremity supported;Feet supported Sitting balance-Leahy Scale: Good     Standing balance support: Bilateral upper extremity supported Standing balance-Leahy Scale: Poor                               Pertinent Vitals/Pain Pain Assessment: Faces Faces Pain Scale: Hurts a little bit Pain Location: L LE Pain Descriptors / Indicators: Aching;Grimacing Pain Intervention(s): Monitored during session;Repositioned;Limited activity within patient's tolerance    Home Living Family/patient expects to be discharged to:: Skilled nursing facility  Additional Comments: was participating in rehab at [facility], active participant with [facility] program as well    Prior Function           Comments: At baseline, mod indep with SPC vs. RW for ADLs and household  mobility; has been using WC as primary mobility since L hip fracture per patient report     Hand Dominance        Extremity/Trunk Assessment   Upper Extremity Assessment: Overall WFL for tasks assessed           Lower Extremity Assessment:  (grossly at least 3-/5 throughout)         Communication   Communication: No difficulties  Cognition Arousal/Alertness: Awake/alert Behavior During Therapy: WFL for tasks assessed/performed Overall Cognitive Status:  (patient somewhat inconsistent in responses, making formal cognitive assessment difficult.  Oriented to self, location, general situation)                      General Comments      Exercises        Assessment/Plan    PT Assessment Patient needs continued PT services  PT Diagnosis Difficulty walking;Generalized weakness   PT Problem List Decreased strength;Decreased range of motion;Decreased activity tolerance;Decreased balance;Decreased mobility;Decreased cognition  PT Treatment Interventions DME instruction;Gait training;Functional mobility training;Therapeutic activities;Therapeutic exercise;Balance training;Patient/family education   PT Goals (Current goals can be found in the Care Plan section) Acute Rehab PT Goals Patient Stated Goal: to keep working with [facility] PT Goal Formulation: With patient Time For Goal Achievement: 01/13/16 Potential to Achieve Goals: Good    Frequency Min 2X/week   Barriers to discharge Decreased caregiver support      Co-evaluation               End of Session Equipment Utilized During Treatment: Gait belt Activity Tolerance: Patient tolerated treatment well Patient left: in bed;with call bell/phone within reach;with bed alarm set Nurse Communication: Mobility status    Functional Assessment Tool Used: clinical judgement Functional Limitation: Mobility: Walking and moving around Mobility: Walking and Moving Around Current Status [id number]): At least 60 percent  but less than 80 percent impaired, limited or restricted Mobility: Walking and Moving Around Goal Status [phone]): At least 1 percent but less than 20 percent impaired, limited or restricted    Time: 1340-1355 PT Time Calculation (min) (ACUTE ONLY): 15 min   Charges:         PT G Codes:   PT G-Codes **NOT FOR INPATIENT CLASS** Functional Assessment Tool Used: clinical judgement Functional Limitation: Mobility: Walking and moving around Mobility: Walking and Moving Around Current Status [id number]): At least 60 percent but less than 80 percent impaired, limited or restricted Mobility: Walking and Moving Around Goal Status [phone]): At least 1 percent but less than 20 percent impaired, limited or restricted    Finn Amos H. Owens Shark, PT, DPT, NCS 12/30/15, 1:53 PM [phone]

## 2015-12-30 NOTE — Discharge Summary (Signed)
[facility] NAME: Jimmy Hill    MR#:  [id number]  DATE OF BIRTH:  09/07/1940  DATE OF ADMISSION:  12/28/2015 ADMITTING PHYSICIAN: Henreitta Leber, MD  DATE OF DISCHARGE: 12/30/15  PRIMARY CARE PHYSICIAN: Gareth Morgan, MD    ADMISSION DIAGNOSIS:  Seizure ([place]) [R56.9] Altered mental status, unspecified altered mental status type [R41.82]  DISCHARGE DIAGNOSIS:  Witnessed seizure x1 (no antiepileptics per Neurology) Dm-2 (sugars well controlled-meds adjusted) Parkinson Disease-chronic  SECONDARY DIAGNOSIS:   Past Medical History:  Diagnosis Date  . Allergic rhinitis   . Anginal pain ([place])   . Arthritis   . Autonomic dysreflexia   . Back pain    CHRONIC LBP,STENOSIS  . CHF (congestive heart failure) ([place])   . Chronic kidney disease    STAGE 3  . COPD (chronic obstructive pulmonary disease) ([place])   . Coronary artery disease   . Depression   . Diabetes ([place])   . Diabetes mellitus without complication ([place])   . DJD (degenerative joint disease)   . Dysphagia   . Edema    FEET/ANKLES  . GERD (gastroesophageal reflux disease)   . Glaucoma   . H/O orthostatic hypotension   . Myocardial infarction ([place])   . Parkinson's disease ([place])   . Peripheral vascular disease ([place])   . Polyneuropathy ([place])   . RLS (restless legs syndrome)   . Sleep apnea   . Smoker   . Strabismic amblyopia   . Tinnitus     HOSPITAL COURSE:   75 year old male with past medical history of Parkinson's disease, hypertension, chronic kidney disease stage III, restless leg syndrome, diabetes, GERD, glaucoma, history of coronary artery disease who came to the hospital due to a witnessed seizure and altered mental status.  1. Altered mental status-this is metabolic encephalopathy secondary to the seizure and postictal state. -CT head shows no acute pathology. No evidence of acute infectious source. -mentation appears at baseline Eating  by self  2. Seizures-new-onset for the patient. Patient apparently had a witnessed tonic-clonic seizure x1. -CT head is negative for any acute pathology. - MRI of the brain with and without contrast negative -Neurology recommends d/c wellbutrin and no need for AED at present  3. Diabetes type 2 without complication-we'll place the patient on sliding scale insulin. suagrs very well controlled. Pt is on high dose of insulin and metformin with [place] Sugars mainly 80-100's with intermittent spike to 193 -to avoid hypoglycemia and sezirue I am going to hold off insulin and [place]. -metformin decreased to 500 mg ER dose -[facility] can adjust meds according to sugars as out pt  4. History of Parkinson's disease-continue Sinemet.  5. Glaucoma-continue Combigan eyedrops.  6. COPD-no acute exacerbation, continue Spiriva, Daliresp, albuterol nebulizers as needed.  7. Depression D/ced wellbutrindue to seizures  8. Hyperlipidemia-continue atorvastatin  PT to see prior to d/c  Arrange HH if needed D/c home  Left message for dter tammy Hamlin CONSULTS OBTAINED:  Treatment Team:  Alexis Goodell, MD  DRUG ALLERGIES:  No Known Allergies  DISCHARGE MEDICATIONS:   Current Discharge Medication List    CONTINUE these medications which have CHANGED   Details  metformin (GLUCOPHAGE-XR) 500 MG 24 hr tablet Take 1 tablet (500 mg total) by mouth daily. Qty: 30 tablet, Refills: 1      CONTINUE these medications which have NOT CHANGED   Details  acetaminophen (TYLENOL) 325 MG tablet Take 650 mg by mouth every 4 (  four) hours as needed for mild pain or headache.     albuterol (PROVENTIL) (2.5 MG/3ML) 0.083% nebulizer solution Take 2.5 mg by nebulization every 4 (four) hours as needed for wheezing or shortness of breath.    aspirin EC 81 MG tablet Take 81 mg by mouth at bedtime.    atorvastatin (LIPITOR) 80 MG tablet Take 80 mg by mouth at bedtime.    brimonidine-timolol  (COMBIGAN) 0.2-0.5 % ophthalmic solution Place 1 drop into both eyes 2 (two) times daily.    Calcium Carbonate-Vitamin D (CALCIUM 600+D) 600-400 MG-UNIT per tablet Take 1 tablet by mouth daily.    carbidopa-levodopa (SINEMET IR) 25-250 MG tablet Take 1 tablet by mouth 4 (four) times daily.    Carboxymethylcellul-Glycerin (OPTIVE) 0.5-0.9 % SOLN Apply 1-2 drops to eye 3 (three) times daily as needed (for dry eyes).    cetirizine (ZYRTEC) 10 MG tablet Take 10 mg by mouth at bedtime.    clopidogrel (PLAVIX) 75 MG tablet Take 75 mg by mouth daily.    dextrose (GLUTOSE) 40 % GEL Take 1 Tube by mouth.    fluticasone (FLONASE) 50 MCG/ACT nasal spray Place 2 sprays into both nostrils daily as needed for rhinitis.    Fluticasone-Salmeterol (ADVAIR) 500-50 MCG/DOSE AEPB Inhale 1 puff into the lungs 2 (two) times daily.     gabapentin (NEURONTIN) 600 MG tablet Take 600 mg by mouth. 1 AM 1 SUPPERTIME 2 HS    guaiFENesin (MUCINEX) 600 MG 12 hr tablet Take 600 mg by mouth daily.    HYDROcodone-acetaminophen (NORCO/VICODIN) 5-325 MG tablet Take 1 tablet by mouth every 6 (six) hours as needed for moderate pain.    lidocaine (XYLOCAINE) 5 % ointment Apply 1 application topically 2 (two) times daily as needed. TO FEET    liver oil-zinc oxide (DESITIN) 40 % ointment Apply 1 application topically as needed for irritation.    nitroGLYCERIN (NITROSTAT) 0.4 MG SL tablet Place 0.4 mg under the tongue every 5 (five) minutes as needed for chest pain.    ondansetron (ZOFRAN) 4 MG tablet Take 4 mg by mouth every 8 (eight) hours as needed for nausea or vomiting.    QUEtiapine (SEROQUEL) 25 MG tablet Take 25 mg by mouth at bedtime.    ranitidine (ZANTAC) 150 MG tablet Take 150 mg by mouth at bedtime.    roflumilast (DALIRESP) 500 MCG TABS tablet Take 500 mcg by mouth daily.    sennosides-docusate sodium (SENOKOT-S) 8.6-50 MG tablet Take 1 tablet by mouth 2 (two) times daily.     sulfamethoxazole-trimethoprim (BACTRIM DS,SEPTRA DS) 800-160 MG tablet Take 1 tablet by mouth 2 (two) times daily.    tiotropium (SPIRIVA) 18 MCG inhalation capsule Place 18 mcg into inhaler and inhale daily. 2 PUFFS    vitamin B-12 (CYANOCOBALAMIN) 1000 MCG tablet Take 1,000 mcg by mouth daily.    Vitamin D, Ergocalciferol, (DRISDOL) 50000 units CAPS capsule Take 50,000 Units by mouth every 30 (thirty) days.      STOP taking these medications     buPROPion (WELLBUTRIN XL) 300 MG 24 hr tablet      insulin glargine (LANTUS) 100 UNIT/ML injection      sitaGLIPtin (JANUVIA) 100 MG tablet         If you experience worsening of your admission symptoms, develop shortness of breath, life threatening emergency, suicidal or homicidal thoughts you must seek medical attention immediately by calling 911 or calling your MD immediately  if symptoms less severe.  You Must read complete instructions/literature along  with all the possible adverse reactions/side effects for all the Medicines you take and that have been prescribed to you. Take any new Medicines after you have completely understood and accept all the possible adverse reactions/side effects.   Please note  You were cared for by a hospitalist during your hospital stay. If you have any questions about your discharge medications or the care you received while you were in the hospital after you are discharged, you can call the unit and asked to speak with the hospitalist on call if the hospitalist that took care of you is not available. Once you are discharged, your primary care physician will handle any further medical issues. Please note that NO REFILLS for any discharge medications will be authorized once you are discharged, as it is imperative that you return to your primary care physician (or establish a relationship with a primary care physician if you do not have one) for your aftercare needs so that they can reassess your need for  medications and monitor your lab values. Today   SUBJECTIVE   No new complaints  VITAL SIGNS:  Blood pressure 110/63, pulse 72, temperature 97.9 F (36.6 C), temperature source Oral, resp. rate 18, height 6\' 1"  (1.854 m), weight 83.4 kg (183 lb 12.8 oz), SpO2 96 %.  I/O:   Intake/Output Summary (Last 24 hours) at 12/30/15 1131 Last data filed at 12/30/15 1009  Gross per 24 hour  Intake              600 ml  Output              320 ml  Net              280 ml    PHYSICAL EXAMINATION:  GENERAL:  75 y.o.-year-old patient lying in the bed with no acute distress.  EYES: Pupils equal, round, reactive to light and accommodation. No scleral icterus. Extraocular muscles intact.  HEENT: Head atraumatic, normocephalic. Oropharynx and nasopharynx clear.  NECK:  Supple, no jugular venous distention. No thyroid enlargement, no tenderness.  LUNGS: Normal breath sounds bilaterally, no wheezing, rales,rhonchi or crepitation. No use of accessory muscles of respiration.  CARDIOVASCULAR: S1, S2 normal. No murmurs, rubs, or gallops.  ABDOMEN: Soft, non-tender, non-distended. Bowel sounds present. No organomegaly or mass.  EXTREMITIES: No pedal edema, cyanosis, or clubbing.  NEUROLOGIC: Cranial nerves II through XII are intact. Muscle strength 5/5 in all extremities. Sensation intact. Gait not checked.  PSYCHIATRIC: The patient is alert and oriented x 3.  SKIN: No obvious rash, lesion, or ulcer.   DATA REVIEW:   CBC   Recent Labs Lab 12/29/15 0425  WBC 11.4*  HGB 13.4  HCT 38.0*  PLT 238    Chemistries   Recent Labs Lab 12/28/15 1240 12/29/15 0425  NA 139 140  K 3.9 4.1  CL 103 103  CO2 29 27  GLUCOSE 72 72  BUN 19 16  CREATININE 1.09 0.99  CALCIUM 8.8* 9.0  MG 1.7  --   AST 22  --   ALT 8*  --   ALKPHOS 104  --   BILITOT 0.8  --     Microbiology Results   Recent Results (from the past 240 hour(s))  MRSA PCR Screening     Status: None   Collection Time: 12/28/15  6:27  PM  Result Value Ref Range Status   MRSA by PCR NEGATIVE NEGATIVE Final    Comment:        The GeneXpert  MRSA Assay (FDA approved for NASAL specimens only), is one component of a comprehensive MRSA colonization surveillance program. It is not intended to diagnose MRSA infection nor to guide or monitor treatment for MRSA infections.     RADIOLOGY:  Ct Head Wo Contrast  Result Date: 12/28/2015 CLINICAL DATA:  New onset seizures today.  Fell 2 days ago. EXAM: CT HEAD WITHOUT CONTRAST TECHNIQUE: Contiguous axial images were obtained from the base of the skull through the vertex without intravenous contrast. COMPARISON:  Head CT 06/13/2014 FINDINGS: Stable age related cerebral atrophy, ventriculomegaly and periventricular white matter disease. No extra-axial fluid collections are identified. No CT findings for acute hemispheric infarction or intracranial hemorrhage. No mass lesions. The brainstem and cerebellum are normal. No acute skull fracture. No bone lesions. The paranasal sinuses and mastoid air cells are clear. The globes are intact. IMPRESSION: Stable age related cerebral atrophy, ventriculomegaly and periventricular white matter disease. No acute intracranial findings or skull fracture. Electronically Signed   By: Marijo Sanes M.D.   On: 12/28/2015 13:42   Mr Jeri Cos [id number] Contrast  Result Date: 12/29/2015 CLINICAL DATA:  Seizure EXAM: MRI HEAD WITHOUT AND WITH CONTRAST TECHNIQUE: Multiplanar, multiecho pulse sequences of the brain and surrounding structures were obtained without and with intravenous contrast. CONTRAST:  63mL MULTIHANCE GADOBENATE DIMEGLUMINE 529 MG/ML IV SOLN COMPARISON:  CT head 12/28/2015 FINDINGS: Moderate atrophy. Moderate ventricular enlargement consistent with atrophy. Negative for acute infarct. Mild chronic microvascular ischemic change in the white matter. Small chronic infarct right cerebellum. Brainstem intact. Negative for mass or edema. No shift of the midline  structures. No fluid collection Postcontrast imaging demonstrates enhancing vessel in the left frontal lobe compatible with developmental venous anomaly. No enhancing mass lesion. Venous sinuses enhance normally and are patent. Paranasal sinuses clear.  Normal orbit bilaterally. IMPRESSION: Atrophy and chronic microvascular ischemia.  No acute infarct Developmental venous anomaly left frontal lobe, incidental finding. Electronically Signed   By: Franchot Gallo M.D.   On: 12/29/2015 15:59   Dg Chest Port 1 View  Result Date: 12/28/2015 CLINICAL DATA:  Seizures EXAM: PORTABLE CHEST 1 VIEW COMPARISON:  12/05/2015 FINDINGS: The heart size and mediastinal contours are within normal limits. Both lungs are clear. The visualized skeletal structures are unremarkable. IMPRESSION: No active disease. Electronically Signed   By: Inez Catalina M.D.   On: 12/28/2015 15:35     Management plans discussed with the patient, family and they are in agreement.  CODE STATUS:     Code Status Orders        Start     Ordered   12/28/15 1719  Full code  Continuous     12/28/15 1718    Code Status History    Date Active Date Inactive Code Status Order ID Comments User Context   12/03/2015  8:59 PM 12/04/2015  3:30 PM Full Code [id number]  Thornton Park, MD ED   02/05/2015  4:14 PM 02/06/2015  2:04 PM Full Code [id number]  Theodoro Grist, MD Inpatient   12/10/2014 12:47 AM 12/10/2014  4:36 PM Full Code [id number]  Nicholes [place], MD Inpatient    Advance Directive Documentation   Flowsheet Row Most Recent Value  Type of Advance Directive  Out of facility DNR (pink MOST or yellow form)  Pre-existing out of facility DNR order (yellow form or pink MOST form)  Pink MOST form placed in chart (order not valid for inpatient use)  "MOST" Form in Place?  No data      TOTAL  TIME TAKING CARE OF THIS PATIENT: 40 minutes.    Dustina Scoggin M.D on 12/30/2015 at 11:31 AM  Between 7am to 6pm - Pager - [phone] After 6pm go to  [email or web address] - password [email or web address] [facility]  Office  [phone]  CC: Primary care physician; Gareth Morgan, MD

## 2015-12-30 NOTE — Clinical Social Work Note (Signed)
Patient to dc to [facility] via EMS. [facility] and family aware. CSW signing off.  Santiago Bumpers, MSW, LCSW-A [phone]

## 2015-12-30 NOTE — Progress Notes (Signed)
Report given to [facility] at [employer]. EMS called for transportation.

## 2015-12-30 NOTE — Discharge Instructions (Signed)
Keep a log of your sugars

## 2015-12-30 NOTE — Progress Notes (Signed)
[facility] [place] NAME: Jimmy Hill    MR#:  [id number]  DATE OF BIRTH:  01-03-1941  SUBJECTIVE:   Waking up some. Wants to eat food. Some confusion No seizures here REVIEW OF SYSTEMS:   Review of Systems  Unable to perform ROS: Medical condition   Tolerating Diet:yes Tolerating PT: pending  DRUG ALLERGIES:  No Known Allergies  VITALS:  Blood pressure 110/63, pulse 72, temperature 97.9 F (36.6 C), temperature source Oral, resp. rate 18, height 6\' 1"  (1.854 m), weight 83.4 kg (183 lb 12.8 oz), SpO2 96 %.  PHYSICAL EXAMINATION:   Physical Exam  GENERAL:  75 y.o.-year-old patient lying in the bed with no acute distress.  EYES: Pupils equal, round, reactive to light and accommodation. No scleral icterus. Extraocular muscles intact.  HEENT: Head atraumatic, normocephalic. Oropharynx and nasopharynx clear.  NECK:  Supple, no jugular venous distention. No thyroid enlargement, no tenderness.  LUNGS: Normal breath sounds bilaterally, no wheezing, rales, rhonchi. No use of accessory muscles of respiration.  CARDIOVASCULAR: S1, S2 normal. No murmurs, rubs, or gallops.  ABDOMEN: Soft, nontender, nondistended. Bowel sounds present. No organomegaly or mass.  EXTREMITIES: No cyanosis, clubbing or edema b/l.    NEUROLOGIC: no focal deficit PSYCHIATRIC:  patient is alert  But confused SKIN: No obvious rash, lesion, or ulcer.   LABORATORY PANEL:  CBC  Recent Labs Lab 12/29/15 0425  WBC 11.4*  HGB 13.4  HCT 38.0*  PLT 238    Chemistries   Recent Labs Lab 12/28/15 1240 12/29/15 0425  NA 139 140  K 3.9 4.1  CL 103 103  CO2 29 27  GLUCOSE 72 72  BUN 19 16  CREATININE 1.09 0.99  CALCIUM 8.8* 9.0  MG 1.7  --   AST 22  --   ALT 8*  --   ALKPHOS 104  --   BILITOT 0.8  --    Cardiac Enzymes No results for input(s): TROPONINI in the last 168 hours. RADIOLOGY:  Ct Head Wo Contrast  Result Date:  12/28/2015 CLINICAL DATA:  New onset seizures today.  Fell 2 days ago. EXAM: CT HEAD WITHOUT CONTRAST TECHNIQUE: Contiguous axial images were obtained from the base of the skull through the vertex without intravenous contrast. COMPARISON:  Head CT 06/13/2014 FINDINGS: Stable age related cerebral atrophy, ventriculomegaly and periventricular white matter disease. No extra-axial fluid collections are identified. No CT findings for acute hemispheric infarction or intracranial hemorrhage. No mass lesions. The brainstem and cerebellum are normal. No acute skull fracture. No bone lesions. The paranasal sinuses and mastoid air cells are clear. The globes are intact. IMPRESSION: Stable age related cerebral atrophy, ventriculomegaly and periventricular white matter disease. No acute intracranial findings or skull fracture. Electronically Signed   By: Marijo Sanes M.D.   On: 12/28/2015 13:42   Mr Jeri Cos [id number] Contrast  Result Date: 12/29/2015 CLINICAL DATA:  Seizure EXAM: MRI HEAD WITHOUT AND WITH CONTRAST TECHNIQUE: Multiplanar, multiecho pulse sequences of the brain and surrounding structures were obtained without and with intravenous contrast. CONTRAST:  14mL MULTIHANCE GADOBENATE DIMEGLUMINE 529 MG/ML IV SOLN COMPARISON:  CT head 12/28/2015 FINDINGS: Moderate atrophy. Moderate ventricular enlargement consistent with atrophy. Negative for acute infarct. Mild chronic microvascular ischemic change in the white matter. Small chronic infarct right cerebellum. Brainstem intact. Negative for mass or edema. No shift of the midline structures. No fluid collection Postcontrast imaging demonstrates enhancing vessel in the left frontal lobe compatible with developmental venous  anomaly. No enhancing mass lesion. Venous sinuses enhance normally and are patent. Paranasal sinuses clear.  Normal orbit bilaterally. IMPRESSION: Atrophy and chronic microvascular ischemia.  No acute infarct Developmental venous anomaly left frontal lobe,  incidental finding. Electronically Signed   By: Franchot Gallo M.D.   On: 12/29/2015 15:59   Dg Chest Port 1 View  Result Date: 12/28/2015 CLINICAL DATA:  Seizures EXAM: PORTABLE CHEST 1 VIEW COMPARISON:  12/05/2015 FINDINGS: The heart size and mediastinal contours are within normal limits. Both lungs are clear. The visualized skeletal structures are unremarkable. IMPRESSION: No active disease. Electronically Signed   By: Inez Catalina M.D.   On: 12/28/2015 15:35   ASSESSMENT AND PLAN:  75 year old male with past medical history of Parkinson's disease, hypertension, chronic kidney disease stage III, restless leg syndrome, diabetes, GERD, glaucoma, history of coronary artery disease who came to the hospital due to a witnessed seizure and altered mental status.  1. Altered mental status-this is metabolic encephalopathy secondary to the seizure and postictal state. -CT head shows no acute pathology. No evidence of acute infectious source.  2. Seizures-new-onset for the patient. Patient apparently had a witnessed tonic-clonic seizure today. -CT head is negative for any acute pathology. -I will get an MRI of the brain with and without contrast. -Get an EEG, get a neurology consult and discussed the case with Dr. Doy Mince.  3. Diabetes type 2 without complication-we'll place the patient on sliding scale insulin.  4. History of Parkinson's disease-continue Sinemet.  5. Glaucoma-continue Combigan eyedrops.  6. COPD-no acute exacerbation, continue Spiriva, Daliresp, albuterol nebulizers as needed.  7. Depression-continue Wellbutrin.  8. Hyperlipidemia-continue atorvastatin.  Case discussed with Care Management/Social Worker. Management plans discussed with the patient, family and they are in agreement.  CODE STATUS: full  DVT Prophylaxis: lovenox TOTAL TIME TAKING CARE OF THIS PATIENT54minutes.  >50% time spent on counselling and coordination of care  POSSIBLE D/C IN 1-2 DAYS,  DEPENDING ON CLINICAL CONDITION.  Note: This dictation was prepared with Dragon dictation along with smaller phrase technology. Any transcriptional errors that result from this process are unintentional.  Wilbur Oakland M.D on 12/30/2015 at 11:21 AM  Between 7am to 6pm - Pager - [phone]  After 6pm go to [email or web address] - password [email or web address] [facility]  Office  [phone]  CC: Primary care physician; Gareth Morgan, MD

## 2016-01-10 DIAGNOSIS — K529 Noninfective gastroenteritis and colitis, unspecified: Secondary | ICD-10-CM | POA: Insufficient documentation

## 2016-01-16 DIAGNOSIS — K625 Hemorrhage of anus and rectum: Secondary | ICD-10-CM | POA: Insufficient documentation

## 2016-03-02 ENCOUNTER — Emergency Department: Payer: Medicare (Managed Care)

## 2016-03-02 ENCOUNTER — Inpatient Hospital Stay
Admission: EM | Admit: 2016-03-02 | Discharge: 2016-03-04 | DRG: 552 | Disposition: A | Discharge status: Home / Self Care | Payer: Medicare (Managed Care) | Attending: Internal Medicine | Admitting: Internal Medicine

## 2016-03-02 ENCOUNTER — Encounter: Payer: Self-pay | Admitting: Emergency Medicine

## 2016-03-02 DIAGNOSIS — R634 Abnormal weight loss: Secondary | ICD-10-CM | POA: Diagnosis not present

## 2016-03-02 DIAGNOSIS — M47812 Spondylosis without myelopathy or radiculopathy, cervical region: Secondary | ICD-10-CM | POA: Diagnosis present

## 2016-03-02 DIAGNOSIS — Z7982 Long term (current) use of aspirin: Secondary | ICD-10-CM

## 2016-03-02 DIAGNOSIS — J449 Chronic obstructive pulmonary disease, unspecified: Secondary | ICD-10-CM | POA: Diagnosis present

## 2016-03-02 DIAGNOSIS — Z23 Encounter for immunization: Secondary | ICD-10-CM | POA: Diagnosis not present

## 2016-03-02 DIAGNOSIS — N183 Chronic kidney disease, stage 3 (moderate): Secondary | ICD-10-CM | POA: Diagnosis present

## 2016-03-02 DIAGNOSIS — I739 Peripheral vascular disease, unspecified: Secondary | ICD-10-CM | POA: Diagnosis present

## 2016-03-02 DIAGNOSIS — Z8249 Family history of ischemic heart disease and other diseases of the circulatory system: Secondary | ICD-10-CM

## 2016-03-02 DIAGNOSIS — E1122 Type 2 diabetes mellitus with diabetic chronic kidney disease: Secondary | ICD-10-CM | POA: Diagnosis present

## 2016-03-02 DIAGNOSIS — I2510 Atherosclerotic heart disease of native coronary artery without angina pectoris: Secondary | ICD-10-CM | POA: Diagnosis present

## 2016-03-02 DIAGNOSIS — Z6825 Body mass index (BMI) 25.0-25.9, adult: Secondary | ICD-10-CM

## 2016-03-02 DIAGNOSIS — K2970 Gastritis, unspecified, without bleeding: Secondary | ICD-10-CM | POA: Diagnosis present

## 2016-03-02 DIAGNOSIS — M4802 Spinal stenosis, cervical region: Principal | ICD-10-CM | POA: Diagnosis present

## 2016-03-02 DIAGNOSIS — F17210 Nicotine dependence, cigarettes, uncomplicated: Secondary | ICD-10-CM | POA: Diagnosis present

## 2016-03-02 DIAGNOSIS — M6281 Muscle weakness (generalized): Secondary | ICD-10-CM

## 2016-03-02 DIAGNOSIS — R195 Other fecal abnormalities: Secondary | ICD-10-CM | POA: Diagnosis not present

## 2016-03-02 DIAGNOSIS — G2581 Restless legs syndrome: Secondary | ICD-10-CM | POA: Diagnosis present

## 2016-03-02 DIAGNOSIS — G20 Parkinson's disease: Secondary | ICD-10-CM | POA: Diagnosis present

## 2016-03-02 DIAGNOSIS — R4182 Altered mental status, unspecified: Secondary | ICD-10-CM | POA: Diagnosis present

## 2016-03-02 DIAGNOSIS — H409 Unspecified glaucoma: Secondary | ICD-10-CM | POA: Diagnosis present

## 2016-03-02 DIAGNOSIS — K922 Gastrointestinal hemorrhage, unspecified: Secondary | ICD-10-CM | POA: Diagnosis present

## 2016-03-02 DIAGNOSIS — Z7902 Long term (current) use of antithrombotics/antiplatelets: Secondary | ICD-10-CM

## 2016-03-02 DIAGNOSIS — I509 Heart failure, unspecified: Secondary | ICD-10-CM | POA: Diagnosis present

## 2016-03-02 DIAGNOSIS — Z7984 Long term (current) use of oral hypoglycemic drugs: Secondary | ICD-10-CM

## 2016-03-02 DIAGNOSIS — Z9861 Coronary angioplasty status: Secondary | ICD-10-CM

## 2016-03-02 DIAGNOSIS — G4730 Sleep apnea, unspecified: Secondary | ICD-10-CM | POA: Diagnosis present

## 2016-03-02 DIAGNOSIS — K219 Gastro-esophageal reflux disease without esophagitis: Secondary | ICD-10-CM | POA: Diagnosis present

## 2016-03-02 DIAGNOSIS — Z79899 Other long term (current) drug therapy: Secondary | ICD-10-CM

## 2016-03-02 DIAGNOSIS — R2681 Unsteadiness on feet: Secondary | ICD-10-CM

## 2016-03-02 DIAGNOSIS — R531 Weakness: Secondary | ICD-10-CM

## 2016-03-02 DIAGNOSIS — I252 Old myocardial infarction: Secondary | ICD-10-CM

## 2016-03-02 DIAGNOSIS — G459 Transient cerebral ischemic attack, unspecified: Secondary | ICD-10-CM

## 2016-03-02 LAB — BASIC METABOLIC PANEL
Anion gap: 5 (ref 5–15)
BUN: 16 mg/dL (ref 6–20)
CHLORIDE: 103 mmol/L (ref 101–111)
CO2: 28 mmol/L (ref 22–32)
CREATININE: 1 mg/dL (ref 0.61–1.24)
Calcium: 8.6 mg/dL — ABNORMAL LOW (ref 8.9–10.3)
GFR calc non Af Amer: >60 mL/min (ref >60)
Glucose, Bld: 137 mg/dL — ABNORMAL HIGH (ref 65–99)
POTASSIUM: 3.5 mmol/L (ref 3.5–5.1)
Sodium: 136 mmol/L (ref 135–145)

## 2016-03-02 LAB — URINALYSIS COMPLETE WITH MICROSCOPIC (ARMC ONLY)
BACTERIA UA: NONE SEEN
Bilirubin Urine: NEGATIVE
Glucose, UA: NEGATIVE mg/dL
HGB URINE DIPSTICK: NEGATIVE
LEUKOCYTES UA: NEGATIVE
Nitrite: NEGATIVE
PH: 6 (ref 5.0–8.0)
Protein, ur: 30 mg/dL — AB
SPECIFIC GRAVITY, URINE: 1.023 (ref 1.005–1.030)
SQUAMOUS EPITHELIAL / LPF: NONE SEEN

## 2016-03-02 LAB — CBC
HEMATOCRIT: 39.2 % — AB (ref 40.0–52.0)
HEMOGLOBIN: 13.5 g/dL (ref 13.0–18.0)
MCH: 30.6 pg (ref 26.0–34.0)
MCHC: 34.4 g/dL (ref 32.0–36.0)
MCV: 89 fL (ref 80.0–100.0)
PLATELETS: 223 10*3/uL (ref 150–440)
RBC: 4.41 MIL/uL (ref 4.40–5.90)
RDW: 13.6 % (ref 11.5–14.5)
WBC: 9.6 10*3/uL (ref 3.8–10.6)

## 2016-03-02 LAB — TROPONIN I

## 2016-03-02 MED ORDER — INSULIN ASPART 100 UNIT/ML ~~LOC~~ SOLN: 0.0000 [IU] | Freq: Every day | SUBCUTANEOUS | Status: DC

## 2016-03-02 MED ORDER — ASPIRIN EC 81 MG PO TBEC
81.0000 mg | DELAYED_RELEASE_TABLET | Freq: Once | ORAL | Status: DC
  Filled 2016-03-02: qty 1

## 2016-03-02 MED ORDER — SODIUM CHLORIDE 0.9 % IV BOLUS (SEPSIS)
500.0000 mL | Freq: Once | INTRAVENOUS | Status: AC
  Administered 2016-03-02: 500 mL via INTRAVENOUS

## 2016-03-02 MED ORDER — INSULIN ASPART 100 UNIT/ML ~~LOC~~ SOLN
0.0000 [IU] | Freq: Three times a day (TID) | SUBCUTANEOUS | Status: DC
  Filled 2016-03-02: qty 2

## 2016-03-02 MED ORDER — FAMOTIDINE IN NACL 20-0.9 MG/50ML-% IV SOLN
20.0000 mg | Freq: Two times a day (BID) | INTRAVENOUS | Status: DC
  Administered 2016-03-03 (×2): 20 mg via INTRAVENOUS
  Filled 2016-03-02 (×4): qty 50

## 2016-03-02 NOTE — ED Provider Notes (Signed)
[facility] Emergency Department Provider Note  ____________________________________________   First MD Initiated Contact with Patient 03/02/16 1731     (approximate)  I have reviewed the triage vital signs and the nursing notes.   HISTORY  Chief Complaint Dizziness and Weakness    HPI Jimmy Hill is a 75 y.o. male patient presents for evaluation today for evaluation ofweakness on his left side and feeling dizzy  Patient reports that sometime about 9:30-10 AM just after being served breakfast he went to stand and knows that he felt dizzy, and he also felt slightly weak in the left arm and left leg since that time  Patient reports he has some weakness on left side, but is not exactly sure why in the past but reports it seems more pronounced today. No fevers chills or headache. No fall or injury. No chest pain or shortness of breath.  Denies any nausea vomiting  Not knows of any new facial droop or trouble talking  Past Medical History:  Diagnosis Date  . Allergic rhinitis   . Anginal pain ([place])   . Arthritis   . Autonomic dysreflexia   . Back pain    CHRONIC LBP,STENOSIS  . CHF (congestive heart failure) ([place])   . Chronic kidney disease    STAGE 3  . COPD (chronic obstructive pulmonary disease) ([place])   . Coronary artery disease   . Depression   . Diabetes ([place])   . Diabetes mellitus without complication ([place])   . DJD (degenerative joint disease)   . Dysphagia   . Edema    FEET/ANKLES  . GERD (gastroesophageal reflux disease)   . Glaucoma   . H/O orthostatic hypotension   . Myocardial infarction   . Parkinson's disease ([place])   . Peripheral vascular disease ([place])   . Polyneuropathy ([place])   . RLS (restless legs syndrome)   . Sleep apnea   . Smoker   . Strabismic amblyopia   . Tinnitus     Patient Active Problem List   Diagnosis Date Noted  . Pressure ulcer 12/29/2015  . Altered mental status 12/28/2015  . Fracture of  femoral neck, left ([place]) 12/03/2015  . CAD (coronary artery disease) 12/03/2015  . Sleep apnea 12/03/2015  . Femoral neck fracture, left, closed, initial encounter 12/03/2015  . COPD (chronic obstructive pulmonary disease) ([place]) 02/05/2015  . Acute bronchitis 02/05/2015  . Type 2 diabetes mellitus ([place]) 02/05/2015  . Chest pain 12/09/2014    Past Surgical History:  Procedure Laterality Date  . ANKLE SURGERY    . APPENDECTOMY    . BACK SURGERY    . CORONARY ANGIOPLASTY     STENT  . CTR    . EYE SURGERY    . HIP PINNING,CANNULATED Left 12/04/2015   Procedure: CANNULATED HIP PINNING;  Surgeon: Thornton Park, MD;  Location: [facility] ORS;  Service: Orthopedics;  Laterality: Left;    Prior to Admission medications   Medication Sig Start Date End Date Taking? Authorizing Provider  acetaminophen (TYLENOL) 325 MG tablet Take 650 mg by mouth every 4 (four) hours as needed for mild pain or headache.     Historical Provider, MD  albuterol (PROVENTIL) (2.5 MG/3ML) 0.083% nebulizer solution Take 2.5 mg by nebulization every 4 (four) hours as needed for wheezing or shortness of breath.    Historical Provider, MD  aspirin EC 81 MG tablet Take 81 mg by mouth at bedtime.    Historical Provider, MD  atorvastatin (LIPITOR) 80 MG tablet Take  80 mg by mouth at bedtime.    Historical Provider, MD  brimonidine-timolol (COMBIGAN) 0.2-0.5 % ophthalmic solution Place 1 drop into both eyes 2 (two) times daily.    Historical Provider, MD  Calcium Carbonate-Vitamin D (CALCIUM 600+D) 600-400 MG-UNIT per tablet Take 1 tablet by mouth daily.    Historical Provider, MD  carbidopa-levodopa (SINEMET IR) 25-250 MG tablet Take 1 tablet by mouth 4 (four) times daily.    Historical Provider, MD  Carboxymethylcellul-Glycerin (OPTIVE) 0.5-0.9 % SOLN Apply 1-2 drops to eye 3 (three) times daily as needed (for dry eyes).    Historical Provider, MD  cetirizine (ZYRTEC) 10 MG tablet Take 10 mg by mouth at bedtime.    Historical  Provider, MD  clopidogrel (PLAVIX) 75 MG tablet Take 75 mg by mouth daily.    Historical Provider, MD  dextrose (GLUTOSE) 40 % GEL Take 1 Tube by mouth.    Historical Provider, MD  fluticasone (FLONASE) 50 MCG/ACT nasal spray Place 2 sprays into both nostrils daily as needed for rhinitis.    Historical Provider, MD  Fluticasone-Salmeterol (ADVAIR) 500-50 MCG/DOSE AEPB Inhale 1 puff into the lungs 2 (two) times daily.     Historical Provider, MD  gabapentin (NEURONTIN) 600 MG tablet Take 600 mg by mouth. 1 AM 1 SUPPERTIME 2 HS    Historical Provider, MD  guaiFENesin (MUCINEX) 600 MG 12 hr tablet Take 600 mg by mouth daily.    Historical Provider, MD  HYDROcodone-acetaminophen (NORCO/VICODIN) 5-325 MG tablet Take 1 tablet by mouth every 6 (six) hours as needed for moderate pain.    Historical Provider, MD  lidocaine (XYLOCAINE) 5 % ointment Apply 1 application topically 2 (two) times daily as needed. TO FEET    Historical Provider, MD  liver oil-zinc oxide (DESITIN) 40 % ointment Apply 1 application topically as needed for irritation.    Historical Provider, MD  metformin (GLUCOPHAGE-XR) 500 MG 24 hr tablet Take 1 tablet (500 mg total) by mouth daily. 12/30/15   Fritzi Mandes, MD  nitroGLYCERIN (NITROSTAT) 0.4 MG SL tablet Place 0.4 mg under the tongue every 5 (five) minutes as needed for chest pain.    Historical Provider, MD  ondansetron (ZOFRAN) 4 MG tablet Take 4 mg by mouth every 8 (eight) hours as needed for nausea or vomiting.    Historical Provider, MD  QUEtiapine (SEROQUEL) 25 MG tablet Take 25 mg by mouth at bedtime. 04/05/15   Historical Provider, MD  ranitidine (ZANTAC) 150 MG tablet Take 150 mg by mouth at bedtime.    Historical Provider, MD  roflumilast (DALIRESP) 500 MCG TABS tablet Take 500 mcg by mouth daily.    Historical Provider, MD  sennosides-docusate sodium (SENOKOT-S) 8.6-50 MG tablet Take 1 tablet by mouth 2 (two) times daily.    Historical Provider, MD    sulfamethoxazole-trimethoprim (BACTRIM DS,SEPTRA DS) 800-160 MG tablet Take 1 tablet by mouth 2 (two) times daily.    Historical Provider, MD  tiotropium (SPIRIVA) 18 MCG inhalation capsule Place 18 mcg into inhaler and inhale daily. 2 PUFFS    Historical Provider, MD  vitamin B-12 (CYANOCOBALAMIN) 1000 MCG tablet Take 1,000 mcg by mouth daily.    Historical Provider, MD  Vitamin D, Ergocalciferol, (DRISDOL) 50000 units CAPS capsule Take 50,000 Units by mouth every 30 (thirty) days.    Historical Provider, MD    Allergies Review of patient's allergies indicates no known allergies.  Family History  Problem Relation Age of Onset  . Heart disease Father  Social History Social History  Substance Use Topics  . Smoking status: Current Some Day Smoker    Packs/day: 0.50    Types: Cigarettes  . Smokeless tobacco: Never Used  . Alcohol use No    Review of Systems Constitutional: No fever/chills Eyes: No visual changes. ENT: No sore throat. Cardiovascular: Denies chest pain. Respiratory: Denies shortness of breath. Gastrointestinal: No abdominal pain.  No nausea, no vomiting.  No diarrhea.  No constipation. Genitourinary: Negative for dysuria. Musculoskeletal: Negative for back pain. Skin: Negative for rash. Neurological: Negative for headaches. Does reports feeling a slight feeling of numbness in the left leg as well today which is new  10-point ROS otherwise negative.  ____________________________________________   PHYSICAL EXAM:  VITAL SIGNS: ED Triage Vitals  Enc Vitals Group     BP 03/02/16 1733 94/66     Pulse Rate 03/02/16 1733 88     Resp 03/02/16 1733 16     Temp 03/02/16 1733 97.8 F (36.6 C)     Temp Source 03/02/16 1733 Oral     SpO2 03/02/16 1733 93 %     Weight 03/02/16 1734 165 lb (74.8 kg)     Height 03/02/16 1734 5\' 8"  (1.727 m)     Head Circumference --      Peak Flow --      Pain Score --      Pain Loc --      Pain Edu? --      Excl. in [place]? --      Constitutional: Alert and oriented. Well appearing and in no acute distress. Eyes: Conjunctivae are normal. PERRL. EOMI. Head: Atraumatic. Nose: No congestion/rhinnorhea. Mouth/Throat: Mucous membranes are moist.  Oropharynx non-erythematous. Neck: No stridor.   Cardiovascular: Normal rate, regular rhythm. Grossly normal heart sounds.  Good peripheral circulation. Respiratory: Normal respiratory effort.  No retractions. Lungs CTAB. Gastrointestinal: Soft and nontender. No distention. No abdominal bruits. No CVA tenderness. Musculoskeletal: No lower extremity tenderness nor edema.  No joint effusions. Neurologic:  Normal speech and language. No gross focal neurologic deficits are appreciated.  NIH score equals 3, performed by me at bedside. The patient has left arm very mild drift The patient has normal cranial nerve exam. Extraocular movements are normal. Visual fields are normal. Patient has 5 out of 5 strength in all extremities, though there is slight pronator drift on the left arm and leg. There is no numbness or gross, acute sensory abnormality in the extremities except the patient notes subjective decrease in sensation over the left leg and arm No speech disturbance. No dysarthria. No aphasia. No ataxia. Normal finger nose finger bilat. Patient speaking in full and clear sentences.   Skin:  Skin is warm, dry and intact. No rash noted. Psychiatric: Mood and affect are normal. Speech and behavior are normal.  ____________________________________________   LABS (all labs ordered are listed, but only abnormal results are displayed)  Labs Reviewed  BASIC METABOLIC PANEL - Abnormal; Notable for the following:       Result Value   Glucose, Bld 137 (*)    Calcium 8.6 (*)    All other components within normal limits  CBC - Abnormal; Notable for the following:    HCT 39.2 (*)    All other components within normal limits  URINALYSIS COMPLETEWITH MICROSCOPIC ([facility] ONLY)  - Abnormal; Notable for the following:    Color, Urine YELLOW (*)    APPearance CLEAR (*)    Ketones, ur TRACE (*)    Protein,  ur 30 (*)    All other components within normal limits  TROPONIN I  POCT GASTRIC OCCULT BLOOD (1-CARD TO LAB)   ____________________________________________  EKG  Reviewed and interpreted by me at 1730 Heart rate 85 QRS 80 QTc 460 Normal sinus rhythm, occasional PVC, no evidence of acute ischemic change or ectopy other than occasional PVC ____________________________________________  RADIOLOGY  Ct Head Wo Contrast  Result Date: 03/02/2016 CLINICAL DATA:  Left-sided weakness since 9:30 a.m. EXAM: CT HEAD WITHOUT CONTRAST TECHNIQUE: Contiguous axial images were obtained from the base of the skull through the vertex without intravenous contrast. COMPARISON:  12/28/2015 FINDINGS: Brain: No evidence of acute infarction, hemorrhage, hydrocephalus, extra-axial collection or mass lesion/mass effect. Generalized atrophy. No unexpected white matter ischemic change. Vascular: No hyperdense vessel or unexpected calcification. Skull: Normal. Negative for fracture or focal lesion. Sinuses/Orbits: Right cataract resection IMPRESSION: 1. No acute finding. 2. Moderate generalized atrophy. Electronically Signed   By: Monte Fantasia M.D.   On: 03/02/2016 21:12  ____________________________________________  PROCEDURES  Procedure(s) performed: None  Procedures  Critical Care performed: No  ____________________________________________   INITIAL IMPRESSION / ASSESSMENT AND PLAN / ED COURSE  Pertinent labs & imaging results that were available during my care of the patient were reviewed by me and considered in my medical decision making (see chart for details).  Patient transfer evaluation of feeling of dizziness with associated weakness over the left arm and leg, very minimal but does have slight pronator drift and discussed the case and reviewed records from previous  neuro consult which don't seem to demonstrate any previous tetanus and the patient reports he does a mild left-sided weakness but seems more pronounced  Patient is well outside of the TPA window with symptoms starting far greater than 5 hours ago, but given his symptomatology at that he would be reasonable to admit him for MRI and further workup as he does seem to have a very minimal focality on the left side. No other clicks patient for his symptoms is not playing a pain  He did notably have a just minimally positive stool guaiac, and is already on aspirin and Plavix. Thus discussed the case with Dr. Shon Hale neurology at 9:45 PM. Will admit for MRI/further neuro work-up.  Patient will be admitted for further workup and observation. Also discussed case and plan with Dr[facility] who is agreeable with this plan  Clinical Course   Discussed with Dr. Estanislado Pandy, recommends eating 81 mg aspirin this evening, they'll continue to follow regarding the slightly occult positive stool, as well as workup for concerns of left-sided weakness.  ____________________________________________   FINAL CLINICAL IMPRESSION(S) / ED DIAGNOSES  Final diagnoses:  Left-sided muscle weakness  Occult GI bleeding      NEW MEDICATIONS STARTED DURING THIS VISIT:  New Prescriptions   No medications on file     Note:  This document was prepared using Dragon voice recognition software and may include unintentional dictation errors.     Delman Kitten, MD 03/02/16 2152

## 2016-03-02 NOTE — H&P (Signed)
[facility] [place] NAME: Jimmy Hill    MR#:  [id number]  DATE OF BIRTH:  07/09/1940  DATE OF ADMISSION:  03/02/2016  PRIMARY CARE PHYSICIAN: Gareth Morgan, MD   REQUESTING/REFERRING PHYSICIAN:   CHIEF COMPLAINT:   Chief Complaint  Patient presents with  . Dizziness  . Weakness    HISTORY OF PRESENT ILLNESS: Jimmy Hill  is a 75 y.o. male with a known history of arthritis, congestive heart failure, chronic kidney disease stage III, COPD, coronary artery disease, diabetes mellitus type 2, GERD, Parkinson's disease, peripheral vascular disease presented to the emergency room with dizziness and weakness since 9:30 AM today morning. Patient felt weak on the left side more than right side. He felt he had weakness in the left arm and left lower extremity no complaints of any slurred speech . No history of fall or head injury. Patient was worked up with a CT head in the emergency room but did not show any acute intracranial abnormality. His stool for occult blood was positive but his hemoglobin was around 30. Patient says he had a colonoscopy week ago at [facility] and they have removed 10 polyps. No complaints of any abdominal pain, nausea or vomiting. No history of any vomiting of blood or coughing up blood. Patient was given baby aspirin in the emergency room case was discussed with neurology who recommended to admit patient for MRI brain and carotid ultrasound for further workup.  PAST MEDICAL HISTORY:   Past Medical History:  Diagnosis Date  . Allergic rhinitis   . Anginal pain ([place])   . Arthritis   . Autonomic dysreflexia   . Back pain    CHRONIC LBP,STENOSIS  . CHF (congestive heart failure) ([place])   . Chronic kidney disease    STAGE 3  . COPD (chronic obstructive pulmonary disease) ([place])   . Coronary artery disease   . Depression   . Diabetes ([place])   . Diabetes mellitus without complication ([place])   . DJD  (degenerative joint disease)   . Dysphagia   . Edema    FEET/ANKLES  . GERD (gastroesophageal reflux disease)   . Glaucoma   . H/O orthostatic hypotension   . Myocardial infarction   . Parkinson's disease ([place])   . Peripheral vascular disease ([place])   . Polyneuropathy ([place])   . RLS (restless legs syndrome)   . Sleep apnea   . Smoker   . Strabismic amblyopia   . Tinnitus     PAST SURGICAL HISTORY: Past Surgical History:  Procedure Laterality Date  . ANKLE SURGERY    . APPENDECTOMY    . BACK SURGERY    . CORONARY ANGIOPLASTY     STENT  . CTR    . EYE SURGERY    . HIP PINNING,CANNULATED Left 12/04/2015   Procedure: CANNULATED HIP PINNING;  Surgeon: Thornton Park, MD;  Location: [facility] ORS;  Service: Orthopedics;  Laterality: Left;    SOCIAL HISTORY:  Social History  Substance Use Topics  . Smoking status: Current Some Day Smoker    Packs/day: 0.50    Types: Cigarettes  . Smokeless tobacco: Never Used  . Alcohol use No    FAMILY HISTORY:  Family History  Problem Relation Age of Onset  . Heart disease Father     DRUG ALLERGIES: No Known Allergies  REVIEW OF SYSTEMS:   CONSTITUTIONAL: No fever, has weakness.  EYES: No blurred or double vision.  EARS, NOSE, AND  THROAT: No tinnitus or ear pain.  RESPIRATORY: No cough, shortness of breath, wheezing or hemoptysis.  CARDIOVASCULAR: No chest pain, orthopnea, edema.  GASTROINTESTINAL: No nausea, vomiting, diarrhea or abdominal pain.  GENITOURINARY: No dysuria, hematuria.  ENDOCRINE: No polyuria, nocturia,  HEMATOLOGY: No anemia, easy bruising or bleeding SKIN: No rash or lesion. MUSCULOSKELETAL: No joint pain or arthritis.   NEUROLOGIC: No tingling, numbness,has left sided weakness.  Has dizziness PSYCHIATRY: No anxiety or depression.   MEDICATIONS AT HOME:  Prior to Admission medications   Medication Sig Start Date End Date Taking? Authorizing Provider  acetaminophen (TYLENOL) 325 MG tablet Take 650 mg by mouth  every 4 (four) hours as needed for mild pain or headache.     Historical Provider, MD  albuterol (PROVENTIL) (2.5 MG/3ML) 0.083% nebulizer solution Take 2.5 mg by nebulization every 4 (four) hours as needed for wheezing or shortness of breath.    Historical Provider, MD  aspirin EC 81 MG tablet Take 81 mg by mouth at bedtime.    Historical Provider, MD  atorvastatin (LIPITOR) 80 MG tablet Take 80 mg by mouth at bedtime.    Historical Provider, MD  brimonidine-timolol (COMBIGAN) 0.2-0.5 % ophthalmic solution Place 1 drop into both eyes 2 (two) times daily.    Historical Provider, MD  Calcium Carbonate-Vitamin D (CALCIUM 600+D) 600-400 MG-UNIT per tablet Take 1 tablet by mouth daily.    Historical Provider, MD  carbidopa-levodopa (SINEMET IR) 25-250 MG tablet Take 1 tablet by mouth 4 (four) times daily.    Historical Provider, MD  Carboxymethylcellul-Glycerin (OPTIVE) 0.5-0.9 % SOLN Apply 1-2 drops to eye 3 (three) times daily as needed (for dry eyes).    Historical Provider, MD  cetirizine (ZYRTEC) 10 MG tablet Take 10 mg by mouth at bedtime.    Historical Provider, MD  clopidogrel (PLAVIX) 75 MG tablet Take 75 mg by mouth daily.    Historical Provider, MD  dextrose (GLUTOSE) 40 % GEL Take 1 Tube by mouth.    Historical Provider, MD  fluticasone (FLONASE) 50 MCG/ACT nasal spray Place 2 sprays into both nostrils daily as needed for rhinitis.    Historical Provider, MD  Fluticasone-Salmeterol (ADVAIR) 500-50 MCG/DOSE AEPB Inhale 1 puff into the lungs 2 (two) times daily.     Historical Provider, MD  gabapentin (NEURONTIN) 600 MG tablet Take 600 mg by mouth. 1 AM 1 SUPPERTIME 2 HS    Historical Provider, MD  guaiFENesin (MUCINEX) 600 MG 12 hr tablet Take 600 mg by mouth daily.    Historical Provider, MD  HYDROcodone-acetaminophen (NORCO/VICODIN) 5-325 MG tablet Take 1 tablet by mouth every 6 (six) hours as needed for moderate pain.    Historical Provider, MD  lidocaine (XYLOCAINE) 5 % ointment Apply 1  application topically 2 (two) times daily as needed. TO FEET    Historical Provider, MD  liver oil-zinc oxide (DESITIN) 40 % ointment Apply 1 application topically as needed for irritation.    Historical Provider, MD  metformin (GLUCOPHAGE-XR) 500 MG 24 hr tablet Take 1 tablet (500 mg total) by mouth daily. 12/30/15   Fritzi Mandes, MD  nitroGLYCERIN (NITROSTAT) 0.4 MG SL tablet Place 0.4 mg under the tongue every 5 (five) minutes as needed for chest pain.    Historical Provider, MD  ondansetron (ZOFRAN) 4 MG tablet Take 4 mg by mouth every 8 (eight) hours as needed for nausea or vomiting.    Historical Provider, MD  QUEtiapine (SEROQUEL) 25 MG tablet Take 25 mg by mouth at bedtime. 04/05/15  Historical Provider, MD  ranitidine (ZANTAC) 150 MG tablet Take 150 mg by mouth at bedtime.    Historical Provider, MD  roflumilast (DALIRESP) 500 MCG TABS tablet Take 500 mcg by mouth daily.    Historical Provider, MD  sennosides-docusate sodium (SENOKOT-S) 8.6-50 MG tablet Take 1 tablet by mouth 2 (two) times daily.    Historical Provider, MD  sulfamethoxazole-trimethoprim (BACTRIM DS,SEPTRA DS) 800-160 MG tablet Take 1 tablet by mouth 2 (two) times daily.    Historical Provider, MD  tiotropium (SPIRIVA) 18 MCG inhalation capsule Place 18 mcg into inhaler and inhale daily. 2 PUFFS    Historical Provider, MD  vitamin B-12 (CYANOCOBALAMIN) 1000 MCG tablet Take 1,000 mcg by mouth daily.    Historical Provider, MD  Vitamin D, Ergocalciferol, (DRISDOL) 50000 units CAPS capsule Take 50,000 Units by mouth every 30 (thirty) days.    Historical Provider, MD      PHYSICAL EXAMINATION:   VITAL SIGNS: Blood pressure 121/76, pulse 70, temperature 97.8 F (36.6 C), temperature source Oral, resp. rate 15, height 5\' 8"  (1.727 m), weight 74.8 kg (165 lb), SpO2 99 %.  GENERAL:  75 y.o.-year-old patient lying in the bed with no acute distress.  EYES: Pupils equal, round, reactive to light and accommodation. No scleral  icterus. Extraocular muscles intact.  HEENT: Head atraumatic, normocephalic. Oropharynx and nasopharynx clear.  NECK:  Supple, no jugular venous distention. No thyroid enlargement, no tenderness.  LUNGS: Normal breath sounds bilaterally, no wheezing, rales,rhonchi or crepitation. No use of accessory muscles of respiration.  CARDIOVASCULAR: S1, S2 normal. No murmurs, rubs, or gallops.  ABDOMEN: Soft, nontender, nondistended. Bowel sounds present. No organomegaly or mass.  EXTREMITIES: No pedal edema, cyanosis, or clubbing.  NEUROLOGIC: Cranial nerves II through XII are intact. Muscle strength 4/5 in left upper and left lower extremities, 5/5 right upper and right lower extremities. Sensation intact. Gait not checked.  PSYCHIATRIC: The patient is alert and oriented x 3.  SKIN: No obvious rash, lesion, or ulcer.   LABORATORY PANEL:   CBC  Recent Labs Lab 03/02/16 1732  WBC 9.6  HGB 13.5  HCT 39.2*  PLT 223  MCV 89.0  MCH 30.6  MCHC 34.4  RDW 13.6   ------------------------------------------------------------------------------------------------------------------  Chemistries   Recent Labs Lab 03/02/16 1732  NA 136  K 3.5  CL 103  CO2 28  GLUCOSE 137*  BUN 16  CREATININE 1.00  CALCIUM 8.6*   ------------------------------------------------------------------------------------------------------------------ estimated creatinine clearance is 61.8 mL/min (by C-G formula based on SCr of 1 mg/dL). ------------------------------------------------------------------------------------------------------------------ No results for input(s): TSH, T4TOTAL, T3FREE, THYROIDAB in the last 72 hours.  Invalid input(s): FREET3   Coagulation profile No results for input(s): INR, PROTIME in the last 168 hours. ------------------------------------------------------------------------------------------------------------------- No results for input(s): DDIMER in the last 72  hours. -------------------------------------------------------------------------------------------------------------------  Cardiac Enzymes  Recent Labs Lab 03/02/16 1732  TROPONINI <0.03   ------------------------------------------------------------------------------------------------------------------ Invalid input(s): POCBNP  ---------------------------------------------------------------------------------------------------------------  Urinalysis    Component Value Date/Time   COLORURINE YELLOW (A) 03/02/2016 1946   APPEARANCEUR CLEAR (A) 03/02/2016 1946   APPEARANCEUR Clear 12/07/2013 2131   LABSPEC 1.023 03/02/2016 1946   LABSPEC 1.015 12/07/2013 2131   PHURINE 6.0 03/02/2016 1946   GLUCOSEU NEGATIVE 03/02/2016 1946   GLUCOSEU >=500 12/07/2013 2131   HGBUR NEGATIVE 03/02/2016 1946   BILIRUBINUR NEGATIVE 03/02/2016 1946   BILIRUBINUR Negative 12/07/2013 2131   KETONESUR TRACE (A) 03/02/2016 1946   PROTEINUR 30 (A) 03/02/2016 1946   NITRITE NEGATIVE 03/02/2016 1946   LEUKOCYTESUR NEGATIVE 03/02/2016  [place] Negative 12/07/2013 2131     RADIOLOGY: Ct Head Wo Contrast  Result Date: 03/02/2016 CLINICAL DATA:  Left-sided weakness since 9:30 a.m. EXAM: CT HEAD WITHOUT CONTRAST TECHNIQUE: Contiguous axial images were obtained from the base of the skull through the vertex without intravenous contrast. COMPARISON:  12/28/2015 FINDINGS: Brain: No evidence of acute infarction, hemorrhage, hydrocephalus, extra-axial collection or mass lesion/mass effect. Generalized atrophy. No unexpected white matter ischemic change. Vascular: No hyperdense vessel or unexpected calcification. Skull: Normal. Negative for fracture or focal lesion. Sinuses/Orbits: Right cataract resection IMPRESSION: 1. No acute finding. 2. Moderate generalized atrophy. Electronically Signed   By: Monte Fantasia M.D.   On: 03/02/2016 21:12    EKG: Orders placed or performed during the hospital  encounter of 03/02/16  . ED EKG  . ED EKG    IMPRESSION AND PLAN: 75 year old male patient with history of type 2 diabetes mellitus, congestive heart failure, Parkinson's disease, sleep apnea, peripheral vascular disease presented to the emergency room with left-sided weakness and guaiac positive stool. Admitting diagnosis 1. GI bleed 2. Left-sided weakness 3. Parkinson's disease 4. Type 2 diabetes mellitus 5. Stable congestive heart failure Treatment plan Admit patient to medical floor Gastrology consultation for anemia with guaiac positive stool Neurology consultation for left-sided weakness for assessment for stroke Carotid ultrasound to rule out obstruction MRI MRA brain to assess for CVA  case was discussed with neurology attending by ER physician were advised to hold aspirin until the MRI brain is done. Sequential compression devices to lower extremities for DVT prophylaxis. Supportive care.  All the records are reviewed and case discussed with ED provider. Management plans discussed with the patient, family and they are in agreement.  CODE STATUS:FULL Code Status History    Date Active Date Inactive Code Status Order ID Comments User Context   12/28/2015  5:18 PM 12/30/2015  8:08 PM Full Code [id number]  Henreitta Leber, MD Inpatient   12/03/2015  8:59 PM 12/04/2015  3:30 PM Full Code [id number]  Thornton Park, MD ED   02/05/2015  4:14 PM 02/06/2015  2:04 PM Full Code [id number]  Theodoro Grist, MD Inpatient   12/10/2014 12:47 AM 12/10/2014  4:36 PM Full Code [id number]  Nicholes Mango, MD Inpatient    Advance Directive Documentation   Flowsheet Row Most Recent Value  Type of Advance Directive  Living will, Healthcare Power of Attorney  Pre-existing out of facility DNR order (yellow form or pink MOST form)  No data  "MOST" Form in Place?  No data       TOTAL TIME TAKING CARE OF THIS PATIENT: 54 minutes.    Saundra Shelling M.D on 03/02/2016 at 10:59 PM  Between 7am to 6pm -  Pager - [phone]  After 6pm go to [email or web address] - password [email or web address] [facility]  Office  [phone]  CC: Primary care physician; Gareth Morgan, MD

## 2016-03-02 NOTE — ED Notes (Signed)
Hemoccult positive 

## 2016-03-02 NOTE — ED Triage Notes (Signed)
Pt presents to ED from home via EMS c/o dizziness and weakness; left side. Per EMS , pt had low BP in [id number] and [id number] systolic.

## 2016-03-03 ENCOUNTER — Inpatient Hospital Stay: Payer: Medicare (Managed Care)

## 2016-03-03 ENCOUNTER — Inpatient Hospital Stay
Admit: 2016-03-03 | Discharge: 2016-03-03 | Disposition: A | Discharge status: Home / Self Care | Payer: Medicare (Managed Care) | Attending: Internal Medicine | Admitting: Internal Medicine

## 2016-03-03 DIAGNOSIS — R531 Weakness: Secondary | ICD-10-CM

## 2016-03-03 DIAGNOSIS — R195 Other fecal abnormalities: Secondary | ICD-10-CM

## 2016-03-03 LAB — BASIC METABOLIC PANEL
Anion gap: 5 (ref 5–15)
BUN: 14 mg/dL (ref 6–20)
CALCIUM: 8.6 mg/dL — AB (ref 8.9–10.3)
CHLORIDE: 110 mmol/L (ref 101–111)
CO2: 25 mmol/L (ref 22–32)
CREATININE: 0.73 mg/dL (ref 0.61–1.24)
GFR calc non Af Amer: >60 mL/min (ref >60)
Glucose, Bld: 123 mg/dL — ABNORMAL HIGH (ref 65–99)
Potassium: 3.6 mmol/L (ref 3.5–5.1)
SODIUM: 140 mmol/L (ref 135–145)

## 2016-03-03 LAB — CBC
HCT: 41.1 % (ref 40.0–52.0)
Hemoglobin: 13.7 g/dL (ref 13.0–18.0)
MCH: 30.3 pg (ref 26.0–34.0)
MCHC: 33.5 g/dL (ref 32.0–36.0)
MCV: 90.5 fL (ref 80.0–100.0)
PLATELETS: 236 10*3/uL (ref 150–440)
RBC: 4.54 MIL/uL (ref 4.40–5.90)
RDW: 13.9 % (ref 11.5–14.5)
WBC: 9.3 10*3/uL (ref 3.8–10.6)

## 2016-03-03 LAB — LIPID PANEL
CHOLESTEROL: 93 mg/dL (ref 0–200)
HDL: 24 mg/dL — ABNORMAL LOW (ref >40)
LDL Cholesterol: 24 mg/dL (ref 0–99)
TRIGLYCERIDES: 227 mg/dL — AB (ref <150)
Total CHOL/HDL Ratio: 3.9 RATIO
VLDL: 45 mg/dL — AB (ref 0–40)

## 2016-03-03 LAB — GLUCOSE, CAPILLARY
GLUCOSE-CAPILLARY: 114 mg/dL — AB (ref 65–99)
GLUCOSE-CAPILLARY: 158 mg/dL — AB (ref 65–99)
GLUCOSE-CAPILLARY: 161 mg/dL — AB (ref 65–99)
Glucose-Capillary: 141 mg/dL — ABNORMAL HIGH (ref 65–99)

## 2016-03-03 LAB — ECHOCARDIOGRAM COMPLETE
Height: 68 in
Weight: 2640 oz

## 2016-03-03 MED ORDER — HYDROCODONE-ACETAMINOPHEN 5-325 MG PO TABS: 1.0000 | ORAL_TABLET | Freq: Four times a day (QID) | ORAL | Status: DC | PRN

## 2016-03-03 MED ORDER — QUETIAPINE FUMARATE 25 MG PO TABS
25.0000 mg | ORAL_TABLET | Freq: Every day | ORAL | Status: DC
  Administered 2016-03-03 (×2): 25 mg via ORAL
  Filled 2016-03-03 (×2): qty 1

## 2016-03-03 MED ORDER — TIMOLOL MALEATE 0.5 % OP SOLN
1.0000 [drp] | Freq: Two times a day (BID) | OPHTHALMIC | Status: DC
  Administered 2016-03-03 – 2016-03-04 (×4): 1 [drp] via OPHTHALMIC
  Filled 2016-03-03: qty 5

## 2016-03-03 MED ORDER — GABAPENTIN 300 MG PO CAPS
600.0000 mg | ORAL_CAPSULE | Freq: Every day | ORAL | Status: DC
  Administered 2016-03-03 (×2): 600 mg via ORAL
  Filled 2016-03-03 (×2): qty 2

## 2016-03-03 MED ORDER — NITROGLYCERIN 0.4 MG SL SUBL: 0.4000 mg | SUBLINGUAL_TABLET | SUBLINGUAL | Status: DC | PRN

## 2016-03-03 MED ORDER — VITAMIN D (ERGOCALCIFEROL) 1.25 MG (50000 UNIT) PO CAPS: 50000.0000 [IU] | ORAL_CAPSULE | ORAL | Status: DC

## 2016-03-03 MED ORDER — STROKE: EARLY STAGES OF RECOVERY BOOK
Freq: Once | Status: AC
  Administered 2016-03-03: 12:00:00

## 2016-03-03 MED ORDER — ALBUTEROL SULFATE (2.5 MG/3ML) 0.083% IN NEBU: 2.5000 mg | INHALATION_SOLUTION | RESPIRATORY_TRACT | Status: DC | PRN

## 2016-03-03 MED ORDER — SENNOSIDES-DOCUSATE SODIUM 8.6-50 MG PO TABS
1.0000 | ORAL_TABLET | Freq: Two times a day (BID) | ORAL | Status: DC
  Administered 2016-03-03 (×2): 1 via ORAL
  Filled 2016-03-03 (×3): qty 1

## 2016-03-03 MED ORDER — SODIUM CHLORIDE 0.9 % IV BOLUS (SEPSIS)
250.0000 mL | Freq: Once | INTRAVENOUS | Status: AC
  Administered 2016-03-03: 08:00:00 250 mL via INTRAVENOUS

## 2016-03-03 MED ORDER — ROFLUMILAST 500 MCG PO TABS
500.0000 ug | ORAL_TABLET | Freq: Every day | ORAL | Status: DC
  Administered 2016-03-03: 500 ug via ORAL
  Filled 2016-03-03 (×2): qty 1

## 2016-03-03 MED ORDER — ACETAMINOPHEN 325 MG PO TABS: 650.0000 mg | ORAL_TABLET | ORAL | Status: DC | PRN

## 2016-03-03 MED ORDER — ATORVASTATIN CALCIUM 20 MG PO TABS
80.0000 mg | ORAL_TABLET | Freq: Every day | ORAL | Status: DC
  Administered 2016-03-03 (×2): 80 mg via ORAL
  Filled 2016-03-03 (×2): qty 4

## 2016-03-03 MED ORDER — PNEUMOCOCCAL VAC POLYVALENT 25 MCG/0.5ML IJ INJ
0.5000 mL | INJECTION | INTRAMUSCULAR | Status: AC
  Administered 2016-03-04: 14:00:00 0.5 mL via INTRAMUSCULAR
  Filled 2016-03-03: qty 0.5

## 2016-03-03 MED ORDER — MOMETASONE FURO-FORMOTEROL FUM 200-5 MCG/ACT IN AERO
2.0000 | INHALATION_SPRAY | Freq: Two times a day (BID) | RESPIRATORY_TRACT | Status: DC
  Administered 2016-03-03 – 2016-03-04 (×4): 2 via RESPIRATORY_TRACT
  Filled 2016-03-03: qty 8.8

## 2016-03-03 MED ORDER — LORATADINE 10 MG PO TABS
10.0000 mg | ORAL_TABLET | Freq: Every day | ORAL | Status: DC
Start: 2016-03-03 — End: 2016-03-04
  Administered 2016-03-03: 10 mg via ORAL
  Filled 2016-03-03 (×2): qty 1

## 2016-03-03 MED ORDER — FAMOTIDINE 20 MG PO TABS
20.0000 mg | ORAL_TABLET | Freq: Every day | ORAL | Status: DC
  Administered 2016-03-03: 20 mg via ORAL
  Filled 2016-03-03: qty 1

## 2016-03-03 MED ORDER — ACETAMINOPHEN 650 MG RE SUPP: 650.0000 mg | RECTAL | Status: DC | PRN

## 2016-03-03 MED ORDER — GUAIFENESIN ER 600 MG PO TB12
600.0000 mg | ORAL_TABLET | Freq: Every day | ORAL | Status: DC
  Administered 2016-03-03: 600 mg via ORAL
  Filled 2016-03-03 (×2): qty 1

## 2016-03-03 MED ORDER — GABAPENTIN 300 MG PO CAPS
300.0000 mg | ORAL_CAPSULE | Freq: Two times a day (BID) | ORAL | Status: DC
  Administered 2016-03-03 (×2): 300 mg via ORAL
  Filled 2016-03-03 (×3): qty 1

## 2016-03-03 MED ORDER — SODIUM CHLORIDE 0.9 % IV SOLN
INTRAVENOUS | Status: DC
  Administered 2016-03-03 (×2): via INTRAVENOUS
  Administered 2016-03-04 (×2): 1000 mL via INTRAVENOUS

## 2016-03-03 MED ORDER — BRIMONIDINE TARTRATE-TIMOLOL 0.2-0.5 % OP SOLN
1.0000 [drp] | Freq: Two times a day (BID) | OPHTHALMIC | Status: DC
Start: 2016-03-03 — End: 2016-03-03

## 2016-03-03 MED ORDER — TIOTROPIUM BROMIDE MONOHYDRATE 18 MCG IN CAPS
18.0000 ug | ORAL_CAPSULE | Freq: Every day | RESPIRATORY_TRACT | Status: DC
  Administered 2016-03-03 – 2016-03-04 (×2): 18 ug via RESPIRATORY_TRACT
  Filled 2016-03-03: qty 5

## 2016-03-03 MED ORDER — GABAPENTIN 600 MG PO TABS: 300.0000 mg | ORAL_TABLET | Freq: Two times a day (BID) | ORAL | Status: DC

## 2016-03-03 MED ORDER — FLUTICASONE PROPIONATE 50 MCG/ACT NA SUSP: 2.0000 | Freq: Every day | NASAL | Status: DC | PRN

## 2016-03-03 MED ORDER — BRIMONIDINE TARTRATE 0.15 % OP SOLN
1.0000 [drp] | Freq: Two times a day (BID) | OPHTHALMIC | Status: DC
  Administered 2016-03-03 – 2016-03-04 (×4): 1 [drp] via OPHTHALMIC
  Filled 2016-03-03: qty 5

## 2016-03-03 MED ORDER — VITAMIN B-12 1000 MCG PO TABS
1000.0000 ug | ORAL_TABLET | Freq: Every day | ORAL | Status: DC
  Administered 2016-03-03: 12:00:00 1000 ug via ORAL
  Filled 2016-03-03 (×2): qty 1

## 2016-03-03 MED ORDER — CARBIDOPA-LEVODOPA 25-250 MG PO TABS
1.0000 | ORAL_TABLET | Freq: Four times a day (QID) | ORAL | Status: DC
  Administered 2016-03-03 – 2016-03-04 (×4): 1 via ORAL
  Filled 2016-03-03 (×5): qty 1

## 2016-03-03 MED ORDER — POLYVINYL ALCOHOL 1.4 % OP SOLN
1.0000 [drp] | Freq: Three times a day (TID) | OPHTHALMIC | Status: DC | PRN
  Filled 2016-03-03: qty 15

## 2016-03-03 MED ORDER — CALCIUM CARBONATE-VITAMIN D 500-200 MG-UNIT PO TABS
1.0000 | ORAL_TABLET | Freq: Every day | ORAL | Status: DC
  Administered 2016-03-03: 1 via ORAL
  Filled 2016-03-03 (×2): qty 1

## 2016-03-03 NOTE — Consult Note (Signed)
Referring Physician: Posey Pronto    Chief Complaint: Weakness  HPI: Jimmy Hill is an 75 y.o. male who presented with complaints of left sided weakness.  Patient was not particularly forthcoming with interaction this morning. Reports that he has been weak, with left sided predominance for months to years.  On review of the chart though it appears that the patient has had intermittent episodes of left sided weakness in the past that has been diagnosed as TIA/CVA.  Patient on ASA and Plavix.  Reportedly presented yesterday after feeling left sided weakness and dizziness.  Initial NIHSS of 3  Date last known well: Date: 03/02/2016 Time last known well: Time: 09:30 tPA Given: No: Outside time window  Past Medical History:  Diagnosis Date  . Allergic rhinitis   . Anginal pain ([place])   . Arthritis   . Autonomic dysreflexia   . Back pain    CHRONIC LBP,STENOSIS  . CHF (congestive heart failure) ([place])   . Chronic kidney disease    STAGE 3  . COPD (chronic obstructive pulmonary disease) ([place])   . Coronary artery disease   . Depression   . Diabetes ([place])   . Diabetes mellitus without complication ([place])   . DJD (degenerative joint disease)   . Dysphagia   . Edema    FEET/ANKLES  . GERD (gastroesophageal reflux disease)   . Glaucoma   . H/O orthostatic hypotension   . Myocardial infarction   . Parkinson's disease ([place])   . Peripheral vascular disease ([place])   . Polyneuropathy ([place])   . RLS (restless legs syndrome)   . Sleep apnea   . Smoker   . Strabismic amblyopia   . Tinnitus     Past Surgical History:  Procedure Laterality Date  . ANKLE SURGERY    . APPENDECTOMY    . BACK SURGERY    . CORONARY ANGIOPLASTY     STENT  . CTR    . EYE SURGERY    . HIP PINNING,CANNULATED Left 12/04/2015   Procedure: CANNULATED HIP PINNING;  Surgeon: Thornton Park, MD;  Location: [facility] ORS;  Service: Orthopedics;  Laterality: Left;    Family History  Problem Relation Age of Onset  . Heart  disease Father    Social History:  reports that he has been smoking Cigarettes.  He has been smoking about 0.50 packs per day. He has never used smokeless tobacco. He reports that he does not drink alcohol or use drugs.  Allergies: No Known Allergies  Medications:  I have reviewed the patient's current medications. Prior to Admission:  Prescriptions Prior to Admission  Medication Sig Dispense Refill Last Dose  . acetaminophen (TYLENOL) 325 MG tablet Take 650 mg by mouth every 4 (four) hours as needed for mild pain or headache.    unknown at unknown  . albuterol (PROVENTIL) (2.5 MG/3ML) 0.083% nebulizer solution Take 2.5 mg by nebulization every 4 (four) hours as needed for wheezing or shortness of breath.   unknown at unknown  . aspirin EC 81 MG tablet Take 81 mg by mouth at bedtime.   unknown at unknown  . atorvastatin (LIPITOR) 80 MG tablet Take 80 mg by mouth at bedtime.   unknown at unknown  . brimonidine-timolol (COMBIGAN) 0.2-0.5 % ophthalmic solution Place 1 drop into both eyes 2 (two) times daily.   unknown at unknown  . Calcium Carbonate-Vitamin D (CALCIUM 600+D) 600-400 MG-UNIT per tablet Take 1 tablet by mouth daily.   unknown at unknown  . carbidopa-levodopa (SINEMET IR) 25-250  MG tablet Take 1 tablet by mouth 4 (four) times daily.   unknown at unknown  . Carboxymethylcellul-Glycerin (OPTIVE) 0.5-0.9 % SOLN Apply 1-2 drops to eye 3 (three) times daily as needed (for dry eyes).   unknown at unknown  . cetirizine (ZYRTEC) 10 MG tablet Take 10 mg by mouth at bedtime.   unknown at unknown  . clopidogrel (PLAVIX) 75 MG tablet Take 75 mg by mouth daily.   unknown at unknown  . dextrose (GLUTOSE) 40 % GEL Take 1 Tube by mouth.   unknown at unknown  . fluticasone (FLONASE) 50 MCG/ACT nasal spray Place 2 sprays into both nostrils daily as needed for rhinitis.   unknown at unknown  . Fluticasone-Salmeterol (ADVAIR) 500-50 MCG/DOSE AEPB Inhale 1 puff into the lungs 2 (two) times daily.     unknown at unknown  . gabapentin (NEURONTIN) 600 MG tablet Take 600 mg by mouth. 1 AM 1 SUPPERTIME 2 HS   unknown at unknown  . guaiFENesin (MUCINEX) 600 MG 12 hr tablet Take 600 mg by mouth daily.   unknown at unknown  . HYDROcodone-acetaminophen (NORCO/VICODIN) 5-325 MG tablet Take 1 tablet by mouth every 6 (six) hours as needed for moderate pain.   unknown at unknown  . lidocaine (XYLOCAINE) 5 % ointment Apply 1 application topically 2 (two) times daily as needed. TO FEET   unknown at unknown  . liver oil-zinc oxide (DESITIN) 40 % ointment Apply 1 application topically as needed for irritation.   unknown at unknown  . metformin (GLUCOPHAGE-XR) 500 MG 24 hr tablet Take 1 tablet (500 mg total) by mouth daily. 30 tablet 1   . nitroGLYCERIN (NITROSTAT) 0.4 MG SL tablet Place 0.4 mg under the tongue every 5 (five) minutes as needed for chest pain.   unknown at unknown  . ondansetron (ZOFRAN) 4 MG tablet Take 4 mg by mouth every 8 (eight) hours as needed for nausea or vomiting.     Marland Kitchen QUEtiapine (SEROQUEL) 25 MG tablet Take 25 mg by mouth at bedtime.   unknown at unknown  . ranitidine (ZANTAC) 150 MG tablet Take 150 mg by mouth at bedtime.   unknown at unknown  . roflumilast (DALIRESP) 500 MCG TABS tablet Take 500 mcg by mouth daily.   unknown at unknown  . sennosides-docusate sodium (SENOKOT-S) 8.6-50 MG tablet Take 1 tablet by mouth 2 (two) times daily.     Marland Kitchen sulfamethoxazole-trimethoprim (BACTRIM DS,SEPTRA DS) 800-160 MG tablet Take 1 tablet by mouth 2 (two) times daily.     Marland Kitchen tiotropium (SPIRIVA) 18 MCG inhalation capsule Place 18 mcg into inhaler and inhale daily. 2 PUFFS   unknown at unknown  . vitamin B-12 (CYANOCOBALAMIN) 1000 MCG tablet Take 1,000 mcg by mouth daily.   unknown at unknown  . Vitamin D, Ergocalciferol, (DRISDOL) 50000 units CAPS capsule Take 50,000 Units by mouth every 30 (thirty) days.   unknown at unknown   Scheduled: .  stroke: mapping our early stages of recovery book    Does not apply Once  . aspirin EC  81 mg Oral Once  . atorvastatin  80 mg Oral QHS  . timolol  1 drop Both Eyes BID   And  . brimonidine  1 drop Both Eyes BID  . calcium-vitamin D  1 tablet Oral Daily  . carbidopa-levodopa  1 tablet Oral QID  . famotidine (PEPCID) IV  20 mg Intravenous Q12H  . famotidine  20 mg Oral Daily  . gabapentin  300 mg Oral BID  And  . gabapentin  600 mg Oral QHS  . guaiFENesin  600 mg Oral Daily  . insulin aspart  0-15 Units Subcutaneous TID WC  . insulin aspart  0-5 Units Subcutaneous QHS  . loratadine  10 mg Oral Daily  . mometasone-formoterol  2 puff Inhalation BID  . [START ON 03/04/2016] pneumococcal 23 valent vaccine  0.5 mL Intramuscular Tomorrow-1000  . QUEtiapine  25 mg Oral QHS  . roflumilast  500 mcg Oral Daily  . senna-docusate  1 tablet Oral BID  . tiotropium  18 mcg Inhalation Daily  . vitamin B-12  1,000 mcg Oral Daily  . [START ON 03/07/2016] Vitamin D (Ergocalciferol)  50,000 Units Oral Q30 days    ROS: History obtained from the patient  General ROS: negative for - chills, fatigue, fever, night sweats, weight gain or weight loss Psychological ROS: negative for - behavioral disorder, hallucinations, memory difficulties, mood swings or suicidal ideation Ophthalmic ROS: negative for - blurry vision, double vision, eye pain or loss of vision ENT ROS: negative for - epistaxis, nasal discharge, oral lesions, sore throat, tinnitus or vertigo Allergy and Immunology ROS: negative for - hives or itchy/watery eyes Hematological and Lymphatic ROS: negative for - bleeding problems, bruising or swollen lymph nodes Endocrine ROS: negative for - galactorrhea, hair pattern changes, polydipsia/polyuria or temperature intolerance Respiratory ROS: negative for - cough, hemoptysis, shortness of breath or wheezing Cardiovascular ROS: negative for - chest pain, dyspnea on exertion, edema or irregular heartbeat Gastrointestinal ROS: negative for - abdominal  pain, diarrhea, hematemesis, nausea/vomiting or stool incontinence Genito-Urinary ROS: negative for - dysuria, hematuria, incontinence or urinary frequency/urgency Musculoskeletal ROS: negative for - joint swelling or muscular weakness Neurological ROS: as noted in HPI Dermatological ROS: negative for rash and skin lesion changes  Physical Examination: Blood pressure 94/62, pulse 79, temperature 98.6 F (37 C), resp. rate 20, height 5\' 8"  (1.727 m), weight 74.8 kg (165 lb), SpO2 97 %.  HEENT-  Normocephalic, no lesions, without obvious abnormality.  Normal external eye and conjunctiva.  Normal TM's bilaterally.  Normal auditory canals and external ears. Normal external nose, mucus membranes and septum.  Normal pharynx. Cardiovascular- S1, S2 normal, pulses palpable throughout   Lungs- chest clear, no wheezing, rales, normal symmetric air entry Abdomen- soft, non-tender; bowel sounds normal; no masses,  no organomegaly Extremities- no edema Lymph-no adenopathy palpable Musculoskeletal-no joint tenderness, deformity or swelling Skin-warm and dry, no hyperpigmentation, vitiligo, or suspicious lesions  Neurological Examination Mental Status: Lethargic, oriented, thought content appropriate.  Not cooperative.  Speech fluent without evidence of aphasia.  Able to follow 3 step commands without difficulty. Cranial Nerves: II: Discs flat bilaterally; Visual fields grossly normal, pupils equal, round, reactive to light and accommodation III,IV, VI: ptosis not present, extra-ocular motions intact bilaterally V,VII: smile symmetric, facial light touch sensation normal bilaterally VIII: hearing normal bilaterally IX,X: gag reflex present XI: bilateral shoulder shrug XII: midline tongue extension Motor: Right : Upper extremity   5/5    Left:     Upper extremity   4/5  Lower extremity   5/5     Lower extremity   4-/5 Tone and bulk:normal tone throughout; no atrophy noted Sensory: Pinprick and  light touch decreased in the LLE Deep Tendon Reflexes: 2+ in the upper extremity and absent in the lower extremities Plantars: Right: mute   Left: mute Cerebellar: Would not cooperate for finger to nose and heel to shin testing Gait: not tested due to safety concerns  Laboratory Studies:  Basic Metabolic Panel:  Recent Labs Lab 03/02/16 1732 03/03/16 0406  NA 136 140  K 3.5 3.6  CL 103 110  CO2 28 25  GLUCOSE 137* 123*  BUN 16 14  CREATININE 1.00 0.73  CALCIUM 8.6* 8.6*    Liver Function Tests: No results for input(s): AST, ALT, ALKPHOS, BILITOT, PROT, ALBUMIN in the last 168 hours. No results for input(s): LIPASE, AMYLASE in the last 168 hours. No results for input(s): AMMONIA in the last 168 hours.  CBC:  Recent Labs Lab 03/02/16 1732 03/03/16 0406  WBC 9.6 9.3  HGB 13.5 13.7  HCT 39.2* 41.1  MCV 89.0 90.5  PLT 223 236    Cardiac Enzymes:  Recent Labs Lab 03/02/16 1732  TROPONINI <0.03    BNP: Invalid input(s): POCBNP  CBG:  Recent Labs Lab 03/03/16 0904 03/03/16 1142  GLUCAP 141* 114*    Microbiology: Results for orders placed or performed during the hospital encounter of 12/28/15  MRSA PCR Screening     Status: None   Collection Time: 12/28/15  6:27 PM  Result Value Ref Range Status   MRSA by PCR NEGATIVE NEGATIVE Final    Comment:        The GeneXpert MRSA Assay (FDA approved for NASAL specimens only), is one component of a comprehensive MRSA colonization surveillance program. It is not intended to diagnose MRSA infection nor to guide or monitor treatment for MRSA infections.     Coagulation Studies: No results for input(s): LABPROT, INR in the last 72 hours.  Urinalysis:  Recent Labs Lab 03/02/16 1946  COLORURINE YELLOW*  LABSPEC 1.023  PHURINE 6.0  GLUCOSEU NEGATIVE  HGBUR NEGATIVE  BILIRUBINUR NEGATIVE  KETONESUR TRACE*  PROTEINUR 30*  NITRITE NEGATIVE  LEUKOCYTESUR NEGATIVE    Lipid Panel:     Component Value Date/Time   CHOL 93 03/03/2016 0406   CHOL 131 08/16/2013 0434   TRIG 227 (H) 03/03/2016 0406   TRIG 597 (H) 08/16/2013 0434   HDL 24 (L) 03/03/2016 0406   HDL 19 (L) 08/16/2013 0434   CHOLHDL 3.9 03/03/2016 0406   VLDL 45 (H) 03/03/2016 0406   VLDL SEE COMMENT 08/16/2013 0434   LDLCALC 24 03/03/2016 0406   LDLCALC SEE COMMENT 08/16/2013 0434    HgbA1C:  Lab Results  Component Value Date   HGBA1C 10.7 (H) 02/05/2015    Urine Drug Screen:  No results found for: LABOPIA, COCAINSCRNUR, LABBENZ, AMPHETMU, THCU, LABBARB  Alcohol Level: No results for input(s): ETH in the last 168 hours.   Imaging: Ct Head Wo Contrast  Result Date: 03/02/2016 CLINICAL DATA:  Left-sided weakness since 9:30 a.m. EXAM: CT HEAD WITHOUT CONTRAST TECHNIQUE: Contiguous axial images were obtained from the base of the skull through the vertex without intravenous contrast. COMPARISON:  12/28/2015 FINDINGS: Brain: No evidence of acute infarction, hemorrhage, hydrocephalus, extra-axial collection or mass lesion/mass effect. Generalized atrophy. No unexpected white matter ischemic change. Vascular: No hyperdense vessel or unexpected calcification. Skull: Normal. Negative for fracture or focal lesion. Sinuses/Orbits: Right cataract resection IMPRESSION: 1. No acute finding. 2. Moderate generalized atrophy. Electronically Signed   By: Monte Fantasia M.D.   On: 03/02/2016 21:12   Mr Brain Wo Contrast  Result Date: 03/03/2016 CLINICAL DATA:  75 year old male with dizziness and weakness since 0930 hours on 03/02/2016. Initial encounter. EXAM: MRI HEAD WITHOUT CONTRAST MRA HEAD WITHOUT CONTRAST TECHNIQUE: Multiplanar, multiecho pulse sequences of the brain and surrounding structures were obtained without intravenous contrast. Angiographic images of  the head were obtained using MRA technique without contrast. COMPARISON:  Head CT without contrast 03/02/2016. Brain MRI 12/29/2015 FINDINGS: MRI HEAD FINDINGS  Brain: No restricted diffusion to suggest acute infarction. No midline shift, mass effect, evidence of mass lesion, ventriculomegaly, extra-axial collection or acute intracranial hemorrhage. Cervicomedullary junction and pituitary are within normal limits. Stable cerebral volume. Mild mass effect on the left medulla is stable and related to distal left vertebral artery tortuosity. Stable scattered small nonspecific cerebral white matter T2 and FLAIR hyperintense foci, extent is mild for age. 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 Negative deep gray matter nuclei and brainstem. No chronic cerebral blood products. Vascular: Major intracranial vascular flow voids are stable and appear normal aside from a mild degree of intracranial artery tortuosity. Skull and upper cervical spine: Stable partially visible upper cervical spine degeneration. Normal bone marrow signal. Sinuses/Orbits: Visualized paranasal sinuses and mastoids are stable and well pneumatized. Stable orbit and scalp soft tissues. Other: Visible internal auditory structures appear normal. Mastoids are clear. MRA HEAD FINDINGS Mildly tortuous distal vertebral arteries with no stenosis. Both PICA origins appear normal. Patent vertebrobasilar junction. No basilar stenosis. Right AICA, bilateral SCA, and PCA origins are normal. Normal PCA branches. Posterior communicating arteries are diminutive or absent. Antegrade flow in both ICA siphons. Mild bilateral siphon irregularity in keeping with atherosclerosis but no stenosis. Normal ophthalmic artery origins. Normal carotid termini, MCA and ACA origins. Mildly fenestrated anterior communicating artery (normal variant). Negative visualized ACA branches. Minimal bilateral MCA M1 segment irregularity without stenosis. Negative visualized bilateral MCA branches. IMPRESSION: 1.  No acute intracranial abnormality. 2. Stable noncontrast MRI appearance the brain since August. Mild for  age chronic small vessel ischemia, primarily in the right cerebellum. 3. Negative for age intracranial MRA; mild intracranial atherosclerosis without stenosis. Electronically Signed   By: Genevie Ann M.D.   On: 03/03/2016 10:14   US Carotid Bilateral (at [facility] And [facility] Only)  Result Date: 03/03/2016 CLINICAL DATA:  TIA. EXAM: BILATERAL CAROTID DUPLEX ULTRASOUND TECHNIQUE: Pearline Cables scale imaging, color Doppler and duplex ultrasound were performed of bilateral carotid and vertebral arteries in the neck. COMPARISON:  None. FINDINGS: Criteria: Quantification of carotid stenosis is based on velocity parameters that correlate the residual internal carotid diameter with NASCET-based stenosis levels, using the diameter of the distal internal carotid lumen as the denominator for stenosis measurement. The following velocity measurements were obtained: RIGHT ICA:  102/35 cm/sec CCA:  123XX123 cm/sec SYSTOLIC ICA/CCA RATIO:  0.9 DIASTOLIC ICA/CCA RATIO:  1.1 ECA:  151 cm/sec LEFT ICA:  99/29 cm/sec CCA:  99991111 cm/sec SYSTOLIC ICA/CCA RATIO:  0.9 DIASTOLIC ICA/CCA RATIO:  0.8 ECA:  153 cm/sec RIGHT CAROTID ARTERY: Bifurcation proximal ICA mild to moderate atherosclerotic vascular disease. No flow limiting stenosis. RIGHT VERTEBRAL ARTERY:  Patent with antegrade flow. LEFT CAROTID ARTERY: Left common carotid, carotid bifurcation, proximal ICA mild to moderate atherosclerotic vascular disease. No flow limiting stenosis. LEFT VERTEBRAL ARTERY:  Patent with antegrade flow. IMPRESSION: 1. 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 No flow limiting stenosis. Degree of stenosis less than 50%. 2. Left common carotid, carotid bifurcation, proximal ICA mild moderate atherosclerotic vascular disease. No flow limiting stenosis. Degree of stenosis less than 50%. 3. Vertebral arteries are patent with antegrade flow bilaterally. Electronically Signed   By: Marcello Moores  Register   On: 03/03/2016 10:55    Mr Jodene Nam Head/brain Wo Cm  Result Date: 03/03/2016 CLINICAL DATA:  75 year old male with  dizziness and weakness since 0930 hours on 03/02/2016. Initial encounter. EXAM: MRI HEAD WITHOUT CONTRAST MRA HEAD WITHOUT CONTRAST TECHNIQUE: Multiplanar, multiecho pulse sequences of the brain and surrounding structures were obtained without intravenous contrast. Angiographic images of the head were obtained using MRA technique without contrast. COMPARISON:  Head CT without contrast 03/02/2016. Brain MRI 12/29/2015 FINDINGS: MRI HEAD FINDINGS Brain: No restricted diffusion to suggest acute infarction. No midline shift, mass effect, evidence of mass lesion, ventriculomegaly, extra-axial collection or acute intracranial hemorrhage. Cervicomedullary junction and pituitary are within normal limits. Stable cerebral volume. Mild mass effect on the left medulla is stable and related to distal left vertebral artery tortuosity. Stable scattered small nonspecific cerebral white matter T2 and FLAIR hyperintense foci, extent is mild for age. 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 Negative deep gray matter nuclei and brainstem. No chronic cerebral blood products. Vascular: Major intracranial vascular flow voids are stable and appear normal aside from a mild degree of intracranial artery tortuosity. Skull and upper cervical spine: Stable partially visible upper cervical spine degeneration. Normal bone marrow signal. Sinuses/Orbits: Visualized paranasal sinuses and mastoids are stable and well pneumatized. Stable orbit and scalp soft tissues. Other: Visible internal auditory structures appear normal. Mastoids are clear. MRA HEAD FINDINGS Mildly tortuous distal vertebral arteries with no stenosis. Both PICA origins appear normal. Patent vertebrobasilar junction. No basilar stenosis. Right AICA, bilateral SCA, and PCA origins are normal. Normal PCA branches. Posterior communicating arteries are  diminutive or absent. Antegrade flow in both ICA siphons. Mild bilateral siphon irregularity in keeping with atherosclerosis but no stenosis. Normal ophthalmic artery origins. Normal carotid termini, MCA and ACA origins. Mildly fenestrated anterior communicating artery (normal variant). Negative visualized ACA branches. Minimal bilateral MCA M1 segment irregularity without stenosis. Negative visualized bilateral MCA branches. IMPRESSION: 1.  No acute intracranial abnormality. 2. Stable noncontrast MRI appearance the brain since August. Mild for age chronic small vessel ischemia, primarily in the right cerebellum. 3. Negative for age intracranial MRA; mild intracranial atherosclerosis without stenosis. Electronically Signed   By: Genevie Ann M.D.   On: 03/03/2016 10:14    Assessment: 75 y.o. male presenting with left sided weakness.  Has had these episodes in the past that have resolved.  Symptoms persist currently.  MRI of the brain personally reviewed and shows no acute changes.  Will rule out cervical etiology.   On ASA and Plavix.  Carotid dopplers show no evidence of hemodynamically significant stenosis.  Echocardiogram pending.  A1c pending, LDL 24.  Stroke Risk Factors - diabetes mellitus and smoking  Plan: 1. HgbA1c, fasting lipid panel 2. MRI of the cervical spine 3. PT consult, OT consult, Speech consult 4. Prophylactic therapy-Continue ASA and Plavix 5. Telemetry monitoring 6. Frequent neuro checks    Alexis Goodell, MD Neurology [phone] 03/03/2016, 12:15 PM

## 2016-03-03 NOTE — Progress Notes (Addendum)
[facility] NAME: Jimmy Hill    MR#:  [id number]  DATE OF BIRTH:  05/02/1941  SUBJECTIVE:  Came in after having left upper extremity weakness more than right.  REVIEW OF SYSTEMS:   Review of Systems  Constitutional: Positive for weight loss. Negative for chills and fever.  HENT: Negative for ear discharge, ear pain and nosebleeds.   Eyes: Negative for blurred vision, pain and discharge.  Respiratory: Negative for sputum production, shortness of breath, wheezing and stridor.   Cardiovascular: Negative for chest pain, palpitations, orthopnea and PND.  Gastrointestinal: Negative for abdominal pain, diarrhea, nausea and vomiting.  Genitourinary: Negative for frequency and urgency.  Musculoskeletal: Negative for back pain and joint pain.  Neurological: Positive for weakness. Negative for sensory change, speech change and focal weakness.  Psychiatric/Behavioral: Negative for depression and hallucinations. The patient is not nervous/anxious.    Tolerating Diet:Yes Tolerating PT: Pending  DRUG ALLERGIES:  No Known Allergies  VITALS:  Blood pressure 101/66, pulse 70, temperature 98.3 F (36.8 C), resp. rate 20, height 5\' 8"  (1.727 m), weight 74.8 kg (165 lb), SpO2 97 %.  PHYSICAL EXAMINATION:   Physical Exam  GENERAL:  75 y.o.-year-old patient lying in the bed with no acute distress.  EYES: Pupils equal, round, reactive to light and accommodation. No scleral icterus. Extraocular muscles intact.  HEENT: Head atraumatic, normocephalic. Oropharynx and nasopharynx clear.  NECK:  Supple, no jugular venous distention. No thyroid enlargement, no tenderness.  LUNGS: Normal breath sounds bilaterally, no wheezing, rales, rhonchi. No use of accessory muscles of respiration.  CARDIOVASCULAR: S1, S2 normal. No murmurs, rubs, or gallops.  ABDOMEN: Soft, nontender, nondistended. Bowel sounds present. No organomegaly or mass.   EXTREMITIES: No cyanosis, clubbing or edema b/l.    NEUROLOGIC: Cranial nerves II through XII are intact. No focal Motor or sensory deficits b/l.  Left upper extremity 4 or 5 weakness sensation normal reflexes 1+ both upper and lower extremity PSYCHIATRIC:  patient is alert and oriented x 3.  SKIN: No obvious rash, lesion, or ulcer.   LABORATORY PANEL:  CBC  Recent Labs Lab 03/03/16 0406  WBC 9.3  HGB 13.7  HCT 41.1  PLT 236    Chemistries   Recent Labs Lab 03/03/16 0406  NA 140  K 3.6  CL 110  CO2 25  GLUCOSE 123*  BUN 14  CREATININE 0.73  CALCIUM 8.6*   Cardiac Enzymes  Recent Labs Lab 03/02/16 1732  TROPONINI <0.03   RADIOLOGY:  Ct Head Wo Contrast  Result Date: 03/02/2016 CLINICAL DATA:  Left-sided weakness since 9:30 a.m. EXAM: CT HEAD WITHOUT CONTRAST TECHNIQUE: Contiguous axial images were obtained from the base of the skull through the vertex without intravenous contrast. COMPARISON:  12/28/2015 FINDINGS: Brain: No evidence of acute infarction, hemorrhage, hydrocephalus, extra-axial collection or mass lesion/mass effect. Generalized atrophy. No unexpected white matter ischemic change. Vascular: No hyperdense vessel or unexpected calcification. Skull: Normal. Negative for fracture or focal lesion. Sinuses/Orbits: Right cataract resection IMPRESSION: 1. No acute finding. 2. Moderate generalized atrophy. Electronically Signed   By: Monte Fantasia M.D.   On: 03/02/2016 21:12   Mr Brain Wo Contrast  Result Date: 03/03/2016 CLINICAL DATA:  75 year old male with dizziness and weakness since 0930 hours on 03/02/2016. Initial encounter. EXAM: MRI HEAD WITHOUT CONTRAST MRA HEAD WITHOUT CONTRAST TECHNIQUE: Multiplanar, multiecho pulse sequences of the brain and surrounding structures were obtained without intravenous contrast. Angiographic images of the head were obtained  using MRA technique without contrast. COMPARISON:  Head CT without contrast 03/02/2016. Brain MRI  12/29/2015 FINDINGS: MRI HEAD FINDINGS Brain: No restricted diffusion to suggest acute infarction. No midline shift, mass effect, evidence of mass lesion, ventriculomegaly, extra-axial collection or acute intracranial hemorrhage. Cervicomedullary junction and pituitary are within normal limits. Stable cerebral volume. Mild mass effect on the left medulla is stable and related to distal left vertebral artery tortuosity. Stable scattered small nonspecific cerebral white matter T2 and FLAIR hyperintense foci, extent is mild for age. 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 Negative deep gray matter nuclei and brainstem. No chronic cerebral blood products. Vascular: Major intracranial vascular flow voids are stable and appear normal aside from a mild degree of intracranial artery tortuosity. Skull and upper cervical spine: Stable partially visible upper cervical spine degeneration. Normal bone marrow signal. Sinuses/Orbits: Visualized paranasal sinuses and mastoids are stable and well pneumatized. Stable orbit and scalp soft tissues. Other: Visible internal auditory structures appear normal. Mastoids are clear. MRA HEAD FINDINGS Mildly tortuous distal vertebral arteries with no stenosis. Both PICA origins appear normal. Patent vertebrobasilar junction. No basilar stenosis. Right AICA, bilateral SCA, and PCA origins are normal. Normal PCA branches. Posterior communicating arteries are diminutive or absent. Antegrade flow in both ICA siphons. Mild bilateral siphon irregularity in keeping with atherosclerosis but no stenosis. Normal ophthalmic artery origins. Normal carotid termini, MCA and ACA origins. Mildly fenestrated anterior communicating artery (normal variant). Negative visualized ACA branches. Minimal bilateral MCA M1 segment irregularity without stenosis. Negative visualized bilateral MCA branches. IMPRESSION: 1.  No acute intracranial abnormality. 2. Stable noncontrast MRI  appearance the brain since August. Mild for age chronic small vessel ischemia, primarily in the right cerebellum. 3. Negative for age intracranial MRA; mild intracranial atherosclerosis without stenosis. Electronically Signed   By: Genevie Ann M.D.   On: 03/03/2016 10:14   Mr Cervical Spine Wo Contrast  Result Date: 03/03/2016 CLINICAL DATA:  Left-sided weakness since yesterday. History of Parkinson's disease and diabetes. EXAM: MRI CERVICAL SPINE WITHOUT CONTRAST TECHNIQUE: Multiplanar, multisequence MR imaging of the cervical spine was performed. No intravenous contrast was administered. COMPARISON:  MRI brain same date. FINDINGS: Alignment: Normal. Vertebrae: No acute or suspicious osseous findings. Cord: There is mild cervical cord atrophy with focal myelomalacia just inferior to the C4-5 disc space level. No evidence of cord edema or hemorrhage. Posterior Fossa, vertebral arteries, paraspinal tissues: Mild atrophy within the posterior fossa. No paraspinal abnormalities are seen. Bilateral vertebral artery flow voids. Disc levels: C2-3: Disc height is maintained. There is asymmetric facet hypertrophy on the left without resulting spinal stenosis or nerve root encroachment. C3-4: Loss of disc height with annular disc bulging and endplate osteophytes asymmetric to the left. The CSF surrounding the cord is effaced without cord deformity. There is moderate left-greater-than-right foraminal narrowing. C4-5: There is spondylosis with loss of disc height, endplate osteophytes and a central disc protrusion. There is cord flattening with narrowing of the AP diameter of the canal to 5 mm. Moderate foraminal narrowing is present, left greater than right. C5-6: There is spondylosis with loss of disc height and an asymmetric left paracentral disc osteophyte complex. There is mild cord flattening on the left with narrowing of the AP diameter of the canal to 6 mm. There is moderate left-greater-than-right foraminal narrowing.  C6-7: Chronic spondylosis with loss of disc height and endplate osteophytes asymmetric to the left. No cord deformity. Minimal foraminal narrowing bilaterally. C7-T1:  No significant findings. IMPRESSION: 1.  Moderate size central disc protrusion at C4-5, contributing to cord flattening and moderate foraminal narrowing, left greater than right. 2. Mild left-sided cord flattening at C5-6 with left-greater-than-right foraminal narrowing. 3. Moderate foraminal narrowing is also present at C3-4 secondary to spondylosis. 4. Cord atrophy with small focus of myelomalacia inferior to the C4-5 disc space level per Electronically Signed   By: Richardean Sale M.D.   On: 03/03/2016 13:34   US Carotid Bilateral (at [facility] Only)  Result Date: 03/03/2016 CLINICAL DATA:  TIA. EXAM: BILATERAL CAROTID DUPLEX ULTRASOUND TECHNIQUE: Pearline Cables scale imaging, color Doppler and duplex ultrasound were performed of bilateral carotid and vertebral arteries in the neck. COMPARISON:  None. FINDINGS: Criteria: Quantification of carotid stenosis is based on velocity parameters that correlate the residual internal carotid diameter with NASCET-based stenosis levels, using the diameter of the distal internal carotid lumen as the denominator for stenosis measurement. The following velocity measurements were obtained: RIGHT ICA:  102/35 cm/sec CCA:  123XX123 cm/sec SYSTOLIC ICA/CCA RATIO:  0.9 DIASTOLIC ICA/CCA RATIO:  1.1 ECA:  151 cm/sec LEFT ICA:  99/29 cm/sec CCA:  99991111 cm/sec SYSTOLIC ICA/CCA RATIO:  0.9 DIASTOLIC ICA/CCA RATIO:  0.8 ECA:  153 cm/sec RIGHT CAROTID ARTERY: Bifurcation proximal ICA mild to moderate atherosclerotic vascular disease. No flow limiting stenosis. RIGHT VERTEBRAL ARTERY:  Patent with antegrade flow. LEFT CAROTID ARTERY: Left common carotid, carotid bifurcation, proximal ICA mild to moderate atherosclerotic vascular disease. No flow limiting stenosis. LEFT VERTEBRAL ARTERY:  Patent with antegrade flow. IMPRESSION: 1.  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 No flow limiting stenosis. Degree of stenosis less than 50%. 2. Left common carotid, carotid bifurcation, proximal ICA mild moderate atherosclerotic vascular disease. No flow limiting stenosis. Degree of stenosis less than 50%. 3. Vertebral arteries are patent with antegrade flow bilaterally. Electronically Signed   By: Marcello Moores  Register   On: 03/03/2016 10:55   Mr Jodene Nam Head/brain Wo Cm  Result Date: 03/03/2016 CLINICAL DATA:  75 year old male with dizziness and weakness since 0930 hours on 03/02/2016. Initial encounter. EXAM: MRI HEAD WITHOUT CONTRAST MRA HEAD WITHOUT CONTRAST TECHNIQUE: Multiplanar, multiecho pulse sequences of the brain and surrounding structures were obtained without intravenous contrast. Angiographic images of the head were obtained using MRA technique without contrast. COMPARISON:  Head CT without contrast 03/02/2016. Brain MRI 12/29/2015 FINDINGS: MRI HEAD FINDINGS Brain: No restricted diffusion to suggest acute infarction. No midline shift, mass effect, evidence of mass lesion, ventriculomegaly, extra-axial collection or acute intracranial hemorrhage. Cervicomedullary junction and pituitary are within normal limits. Stable cerebral volume. Mild mass effect on the left medulla is stable and related to distal left vertebral artery tortuosity. Stable scattered small nonspecific cerebral white matter T2 and FLAIR hyperintense foci, extent is mild for age. 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 Negative deep gray matter nuclei and brainstem. No chronic cerebral blood products. Vascular: Major intracranial vascular flow voids are stable and appear normal aside from a mild degree of intracranial artery tortuosity. Skull and upper cervical spine: Stable partially visible upper cervical spine degeneration. Normal bone marrow signal. Sinuses/Orbits: Visualized paranasal  sinuses and mastoids are stable and well pneumatized. Stable orbit and scalp soft tissues. Other: Visible internal auditory structures appear normal. Mastoids are clear. MRA HEAD FINDINGS Mildly tortuous distal vertebral arteries with no stenosis. Both PICA origins appear normal. Patent vertebrobasilar junction. No basilar stenosis. Right AICA, bilateral SCA, and PCA origins are normal. Normal PCA branches. Posterior communicating arteries  are diminutive or absent. Antegrade flow in both ICA siphons. Mild bilateral siphon irregularity in keeping with atherosclerosis but no stenosis. Normal ophthalmic artery origins. Normal carotid termini, MCA and ACA origins. Mildly fenestrated anterior communicating artery (normal variant). Negative visualized ACA branches. Minimal bilateral MCA M1 segment irregularity without stenosis. Negative visualized bilateral MCA branches. IMPRESSION: 1.  No acute intracranial abnormality. 2. Stable noncontrast MRI appearance the brain since August. Mild for age chronic small vessel ischemia, primarily in the right cerebellum. 3. Negative for age intracranial MRA; mild intracranial atherosclerosis without stenosis. Electronically Signed   By: Genevie Ann M.D.   On: 03/03/2016 10:14   ASSESSMENT AND PLAN:  Davis Mikita  is a 75 y.o. male with a known history of arthritis, congestive heart failure, chronic kidney disease stage III, COPD, coronary artery disease, diabetes mellitus type 2, GERD, Parkinson's disease, peripheral vascular disease presented to the emergency room with dizziness and weakness since 9:30 AM today morning. Patient felt weak on the left side more than right side. He felt he had weakness in the left arm and left lower extremity no complaints of any slurred speech    1. 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 Occupation therapy  recommends extensive upper extremity strengthening exercises -MRI negative for stroke -Appreciated neurological input -Await PTU well and final recommendations  2. Parkinson's disease continue carbidopa levodopa and gabapentin -Patient is on quite a high dose of gabapentin will ask pharmacy to see if it needs to be adjusted  3. Type 2 diabetes mellitus - sugar stable continue home meds   4. Stable congestive heart failure  5. Positive occult stools -Patient had colonoscopy done 02/19/2016 at [facility] which showed polyps that were removed about 10.  6. Subjective weight loss according to patient and patient's daughter -Primary care physician aware about weight loss at [facility] -Daughter was Curious about cancer screening. Patient has routine labs within normal limits -Colonoscopy was negative for any mass -Chest x-ray  in the past does not show any lung mass -Patient is tolerating Ensure well. -Discussed with daughter to follow-up this with primary care physician at [facility].  [facility] and case manager for discharge planning e discussed with Care Management/Social Worker. Management plans discussed with the patient, family and they are in agreement.  CODE STATUS:full  DVT Prophylaxis: lovenox  TOTAL TIME TAKING CARE OF THIS PATIENT: 47minutes.  >50% time spent on counselling and coordination of care pt and dter (over the phone)  POSSIBLE D/C IN 1-2 DAYS, DEPENDING ON CLINICAL CONDITION.  Note: This dictation was prepared with Dragon dictation along with smaller phrase technology. Any transcriptional errors that result from this process are unintentional.  Bryse Blanchette M.D on 03/03/2016 at 3:38 PM  Between 7am to 6pm - Pager - [phone]  After 6pm go to [email or web address] - password [email or web address] [facility]  Office  [phone]  CC: Primary care physician; Gareth Morgan, MD

## 2016-03-03 NOTE — Progress Notes (Signed)
CSW aware that PT recommend SNF. Per PT patient refused SNF. CSW completed FL2 in case patient decided to go to SNF. CSW will complete assessment tomorrow 03/04/16.  Ernest Pine, MSW, LCSW, [facility] Clinical Social Worker [phone]

## 2016-03-03 NOTE — Progress Notes (Signed)
*  PRELIMINARY RESULTS* Echocardiogram 2D Echocardiogram has been performed.  Jimmy Hill 03/03/2016, 10:51 AM

## 2016-03-03 NOTE — Progress Notes (Signed)
PT Attempt Note  Patient Details Name: Jimmy Hill MRN: [id number] DOB: [birth year]   Cancelled Treatment:    Reason Eval/Treat Not Completed: Patient at procedure or test/unavailable. Order received and chart reviewed. Attempted to evaluate patient but arrived at same time as transport. Pt headed for MRI and ultrasound. Will attempt PT evaluation at later time/date as pt is available.  Lyndel Safe Nili Honda PT, DPT   Lakeisha Waldrop 03/03/2016, 10:21 AM

## 2016-03-03 NOTE — Evaluation (Signed)
Physical Therapy Evaluation Patient Details Name: Jimmy Hill MRN: [id number] DOB: Mar 08, 1941 Today's Date: 03/03/2016   History of Present Illness  Jimmy Hill  is a 75 y.o. male with a known history of arthritis, congestive heart failure, chronic kidney disease stage III, COPD, coronary artery disease, diabetes mellitus type 2, GERD, Parkinson's disease, peripheral vascular disease presented to the emergency room with dizziness and weakness. Patient felt weak on the left side more than right side. He felt he had weakness in the left arm and left lower extremity no complaints of any slurred speech . No history of fall or head injury. Patient was worked up with a CT head in the emergency room but did not show any acute intracranial abnormality. His stool for occult blood was positive but his hemoglobin was around 30. Patient says he had a colonoscopy week ago at [facility] and they have removed 10 polyps. No complaints of any abdominal pain, nausea or vomiting. No history of any vomiting of blood or coughing up blood. Patient was given baby aspirin in the emergency room case was discussed with neurology who recommended to admit patient for MRI brain and carotid ultrasound for further workup. Pt reports 1 fall in the last 12 months which resulted in a L hip fracture "10-11 weeks ago." Pt states that he is not open to considering SNF placement at this time  Clinical Impression  Pt admitted with above diagnosis. Pt currently with functional limitations due to the deficits listed below (see PT Problem List).  Pt irritable during PT session providing brief responses and limiting his conversation with therapist. He is able to perform bed mobility with modI and transfers with CGA only. However he requires minA+1 during ambulation due to instability and poor safety awareness. Pt moves slowly at a speed which is only functional for limited household ambulation. He does present with LUE weakness compared  to RUE. LLE is also weak but it is difficult to assess if this is new or related to recent L hip fracture. Pt is a high risk for falls and readmission. Recommend SNF placement however pt currently refuses. Please arrange pt to work with PT at [facility] program following discharge if he continues to refuse. Recommend he utilize his rolling walker at discharge. Pt states that he owns a 2 wheeled-walker. Pt will benefit from skilled PT services to address deficits in strength, balance, and mobility in order to return to full function at home.     Follow Up Recommendations SNF;Other (comment) (Pt refuses. Please arrange PT through [facility])    Equipment Recommendations  None recommended by PT;Other (comment) (Recommend he use rolling walker at discharge)    Recommendations for Other Services       Precautions / Restrictions Precautions Precautions: Fall Restrictions Weight Bearing Restrictions: No      Mobility  Bed Mobility Overal bed mobility: Modified Independent             General bed mobility comments: HOB elevated and use of bed rails for bed mobility  Transfers Overall transfer level: Needs assistance Equipment used: Rolling walker (2 wheeled) Transfers: Sit to/from Stand Sit to Stand: Min guard         General transfer comment: Pt requires increased time to come to standing with decreased anterior weight shifting. Once upright relies on bilateral UEs for additional support  Ambulation/Gait Ambulation/Gait assistance: Min assist Ambulation Distance (Feet): 25 Feet Assistive device: Rolling walker (2 wheeled) Gait Pattern/deviations: Decreased step length - right;Decreased step  length - left Gait velocity: Decreased Gait velocity interpretation: <1.8 ft/sec, indicative of risk for recurrent falls General Gait Details: Pt ambulates with short shuffling steps. He is relatively unsteady even with use of rolling walker requiring mina+1 intermittently to assist with balance. Poor  safety awareness and poor balance with turns.  Stairs            Wheelchair Mobility    Modified Rankin (Stroke Patients Only)       Balance Overall balance assessment: Needs assistance Sitting-balance support: No upper extremity supported Sitting balance-Leahy Scale: Good     Standing balance support: No upper extremity supported Standing balance-Leahy Scale: Fair Standing balance comment: Pt able to maintain wide stance without UE support. Unable to achieve or maintain feet together position due to almost immediately LOB posterior                             Pertinent Vitals/Pain Pain Assessment: No/denies pain    Home Living Family/patient expects to be discharged to:: Private residence Living Arrangements: Alone Available Help at Discharge: Home health;Other (Comment) (Pt reports 7x/wk, 2-3 hours/day. [facility] to [facility] program) Type of Home: Apartment Home Access: Level entry     Home Layout: One level Home Equipment: Wheelchair - manual;Wheelchair - power;Youth worker - 2 wheels;Walker - 4 wheels Additional Comments: was participating in rehab at [facility] and was active participant with [facility] program as well 2 months ago    Prior Function Level of Independence: Independent with assistive device(s)         Comments: Difficult to get patient to answer questions clearly. He reports that he alternates between using a wheelchair or a rolling walker. States that he ambulated from one end of the house to the other as recently as last Saturday. At baseline, mod indep with RW vs wheelchair for ADLs and household mobility;     Hand Dominance   Dominant Hand: Right    Extremity/Trunk Assessment   Upper Extremity Assessment: LUE deficits/detail       LUE Deficits / Details: Pt with 4-/5 LUE strength compared to 4 to 4+/5 RUE strength. Notable LUE weakness   Lower Extremity Assessment: LLE deficits/detail   LLE Deficits / Details: Pt  with 4-/5 L hip flexion, L knee extension, and L knee flexion. L ankle DF appears to be 4/5. Unclear if weakness is acute or related to recent L hip fracture     Communication   Communication: No difficulties  Cognition Arousal/Alertness: Lethargic Behavior During Therapy: Flat affect;Agitated Overall Cognitive Status: Within Functional Limits for tasks assessed                      General Comments      Exercises     Assessment/Plan    PT Assessment Patient needs continued PT services  PT Problem List Decreased strength;Decreased activity tolerance;Decreased balance;Decreased safety awareness          PT Treatment Interventions DME instruction;Gait training;Therapeutic activities;Therapeutic exercise;Balance training;Functional mobility training;Neuromuscular re-education;Patient/family education    PT Goals (Current goals can be found in the Care Plan section)  Acute Rehab PT Goals Patient Stated Goal: "to go home" PT Goal Formulation: With patient Time For Goal Achievement: 03/17/16 Potential to Achieve Goals: Fair    Frequency Min 2X/week   Barriers to discharge Decreased caregiver support Lives alone and reports no family nearby to assist    Co-evaluation  End of Session Equipment Utilized During Treatment: Gait belt Activity Tolerance: Patient tolerated treatment well Patient left: in bed;with call bell/phone within reach;with bed alarm set      Functional Assessment Tool Used: clinical judgement Functional Limitation: Mobility: Walking and moving around Mobility: Walking and Moving Around Current Status [id number]): At least 60 percent but less than 80 percent impaired, limited or restricted Mobility: Walking and Moving Around Goal Status [phone]): At least 40 percent but less than 60 percent impaired, limited or restricted    Time: 1545-1601 PT Time Calculation (min) (ACUTE ONLY): 16 min   Charges:   PT Evaluation $PT Eval  Moderate Complexity: 1 Procedure     PT G Codes:   PT G-Codes **NOT FOR INPATIENT CLASS** Functional Assessment Tool Used: clinical judgement Functional Limitation: Mobility: Walking and moving around Mobility: Walking and Moving Around Current Status [id number]): At least 60 percent but less than 80 percent impaired, limited or restricted Mobility: Walking and Moving Around Goal Status [id number]): At least 40 percent but less than 60 percent impaired, limited or restricted   Phillips Grout PT, DPT   Jadi Deyarmin 03/03/2016, 4:39 PM

## 2016-03-03 NOTE — Care Management Important Message (Signed)
Important Message  Patient Details  Name: Jimmy Hill MRN: [id number] Date of Birth: 06-18-1940   Medicare Important Message Given:  Yes    Shelbie Ammons, RN 03/03/2016, 8:10 AM

## 2016-03-03 NOTE — Progress Notes (Signed)
Pt transported to MRI and [place].

## 2016-03-03 NOTE — Evaluation (Signed)
Occupational Therapy Evaluation Patient Details Name: JETHRO EADEN MRN: [id number] DOB: Oct 11, 1940 Today's Date: 03/03/2016    History of Present Illness Syeed Rathgeber  is a 75 y.o. male with a known history of arthritis, congestive heart failure, chronic kidney disease stage III, COPD, coronary artery disease, diabetes mellitus type 2, GERD, Parkinson's disease, peripheral vascular disease presented to the emergency room with dizziness and weakness. Patient felt weak on the left side more than right side. He felt he had weakness in the left arm and left lower extremity no complaints of any slurred speech . No history of fall or head injury. Patient was worked up with a CT head in the emergency room but did not show any acute intracranial abnormality. His stool for occult blood was positive but his hemoglobin was around 30. Patient says he had a colonoscopy week ago at [facility] and they have removed 10 polyps. No complaints of any abdominal pain, nausea or vomiting. No history of any vomiting of blood or coughing up blood. Patient was given baby aspirin in the emergency room case was discussed with neurology who recommended to admit patient for MRI brain and carotid ultrasound for further workup   Clinical Impression   Pt. Is a 75  y.o. male who was admitted for a CVA with Left sided weakness. Pt. presents with a flat affect and intermittent lethargy which NSG indicated was from medications from tests he had earlier today.  He also has decreased strength, balance and decreased functional endurance,which hinders his ability to complete ADL and IADL tasks. Patient could benefit from skilled OT services to work on LUE neuro muscular re-ed and  therapeutic exercises.  Patient would be a good candidate to continue OT services at SNF and stated he was receiving therapy at [facility] prior to admission.      Follow Up Recommendations  SNF (depending on progress---may be able to have [facility] OT if he  progresses with strength and stamina)    Equipment Recommendations       Recommendations for Other Services       Precautions / Restrictions Precautions Precautions: Fall Restrictions Weight Bearing Restrictions: No      Mobility Bed Mobility                  Transfers                      Balance                                            ADL Overall ADL's : Needs assistance/impaired Eating/Feeding: Independent;Set up   Grooming: Wash/dry hands;Wash/dry face;Oral care;Brushing hair;Set up               Lower Body Dressing: Minimal assistance;Set up                       Vision     Perception     Praxis      Pertinent Vitals/Pain Pain Assessment: No/denies pain     Hand Dominance Right   Extremity/Trunk Assessment Upper Extremity Assessment Upper Extremity Assessment: LUE deficits/detail LUE Deficits / Details: Pt presents iwth weakness in LUE with slow responses during sensation testing and then had to use bedpan with improved alertness after this and eating sitting EOB   Lower Extremity Assessment Lower  Extremity Assessment: Defer to PT evaluation       Communication Communication Communication: No difficulties   Cognition Arousal/Alertness: Lethargic Behavior During Therapy: Flat affect Overall Cognitive Status: Within Functional Limits for tasks assessed                     General Comments       Exercises       Shoulder Instructions      Home Living Family/patient expects to be discharged to:: Private residence Living Arrangements: Alone   Type of Home: Apartment       Home Layout: One level     Bathroom Shower/Tub: Tub/shower unit Shower/tub characteristics: Architectural technologist: Standard Bathroom Accessibility: No   Home Equipment: Wheelchair - Education officer, community - power;Electric scooter   Additional Comments: was participating in rehab at [facility] and was  active participant with [facility] program as well 2 months ago      Prior Functioning/Environment Level of Independence: Independent with assistive device(s)                 OT Problem List: Decreased strength;Decreased range of motion;Decreased activity tolerance;Impaired balance (sitting and/or standing)   OT Treatment/Interventions:      OT Goals(Current goals can be found in the care plan section) Acute Rehab OT Goals Patient Stated Goal: "to go home" OT Goal Formulation: With patient Time For Goal Achievement: 03/17/16 Potential to Achieve Goals: Good ADL Goals Pt Will Perform Lower Body Dressing: with min assist;sit to/from stand (using FWW and no LOB) Pt Will Transfer to Toilet: with min assist;regular height toilet (with FWW and no LOB)  OT Frequency: Min 1X/week   Barriers to D/C:            Co-evaluation              End of Session    Activity Tolerance: Patient limited by fatigue Patient left: in bed;with nursing/sitter in room (pt needing bedpan and left with NSG)   Time: 1415-1450 OT Time Calculation (min): 35 min Charges:  OT General Charges $OT Visit: 1 Procedure OT Evaluation $OT Eval Moderate Complexity: 1 Procedure OT Treatments $Self Care/Home Management : 8-22 mins G-Codes:     Chrys Racer, OTR/L ascom [phone] 03/03/16, 3:17 PM

## 2016-03-03 NOTE — Progress Notes (Signed)
OT Cancellation Note  Patient Details Name: Jimmy Hill MRN: [id number] DOB: 1941-04-26   Cancelled Treatment:    Reason Eval/Treat Not Completed: Patient at procedure or test/ unavailable Order received and chart reviewed. Pt at MRI and ultrasound. Will attempt OT evaluation at later time/date as pt is available. Thank you for the referral.  Chrys Racer, OTR/L ascom [phone] 03/03/16, 10:34 AM

## 2016-03-03 NOTE — Care Management (Addendum)
Admitted to [facility] with the diagnosis of GI bleed. Lives alone. Daughter is [name] [phone]). [facility] x [facility] years. Attends [facility] on Monday-Tuesday and Thursday. States that a Psychologist, counselling comes to his home the other days that he is not attending [facility]. [place] in the past, [facility] 3 weeks ago. Last fall was 10 weeks ago. States he has a good appetite, but has lost 60 lbs since February.  Shelbie Ammons RN MSN CCM Care Management [phone]

## 2016-03-03 NOTE — Consult Note (Signed)
Jimmy Lame, MD [address] [place], [place] [address] Phone: [phone] Fax : [phone]  Consultation  Referring Provider:     No ref. provider found Primary Care Physician:  Gareth Morgan, MD Primary Gastroenterologist:  Dr. At [facility]         Reason for Consultation:     Heme positive stools  Date of Admission:  03/02/2016 Date of Consultation:  03/03/2016         HPI:   Jimmy Hill is a 75 y.o. male who was admitted with a change in mental status and weakness. He reports that he had a colonoscopy at the beginning of this month at [facility] and had 10 polyps removed.  He now reports that he has been having black stools and he states that he has lost 68 pounds. Despite the heme positive stools and black stools the patient's hemoglobin is normal.  I'm now being asked to see this patient for heme positive stools.  Past Medical History:  Diagnosis Date  . Allergic rhinitis   . Anginal pain ([place])   . Arthritis   . Autonomic dysreflexia   . Back pain    CHRONIC LBP,STENOSIS  . CHF (congestive heart failure) ([place])   . Chronic kidney disease    STAGE 3  . COPD (chronic obstructive pulmonary disease) ([place])   . Coronary artery disease   . Depression   . Diabetes ([place])   . Diabetes mellitus without complication ([place])   . DJD (degenerative joint disease)   . Dysphagia   . Edema    FEET/ANKLES  . GERD (gastroesophageal reflux disease)   . Glaucoma   . H/O orthostatic hypotension   . Myocardial infarction   . Parkinson's disease ([place])   . Peripheral vascular disease ([place])   . Polyneuropathy ([place])   . RLS (restless legs syndrome)   . Sleep apnea   . Smoker   . Strabismic amblyopia   . Tinnitus     Past Surgical History:  Procedure Laterality Date  . ANKLE SURGERY    . APPENDECTOMY    . BACK SURGERY    . CORONARY ANGIOPLASTY     STENT  . CTR    . EYE SURGERY    . HIP PINNING,CANNULATED Left 12/04/2015   Procedure: CANNULATED HIP PINNING;  Surgeon:  Thornton Park, MD;  Location: [facility] ORS;  Service: Orthopedics;  Laterality: Left;    Prior to Admission medications   Medication Sig Start Date End Date Taking? Authorizing Provider  acetaminophen (TYLENOL) 325 MG tablet Take 650 mg by mouth every 4 (four) hours as needed for mild pain or headache.     Historical Provider, MD  albuterol (PROVENTIL) (2.5 MG/3ML) 0.083% nebulizer solution Take 2.5 mg by nebulization every 4 (four) hours as needed for wheezing or shortness of breath.    Historical Provider, MD  aspirin EC 81 MG tablet Take 81 mg by mouth at bedtime.    Historical Provider, MD  atorvastatin (LIPITOR) 80 MG tablet Take 80 mg by mouth at bedtime.    Historical Provider, MD  brimonidine-timolol (COMBIGAN) 0.2-0.5 % ophthalmic solution Place 1 drop into both eyes 2 (two) times daily.    Historical Provider, MD  Calcium Carbonate-Vitamin D (CALCIUM 600+D) 600-400 MG-UNIT per tablet Take 1 tablet by mouth daily.    Historical Provider, MD  carbidopa-levodopa (SINEMET IR) 25-250 MG tablet Take 1 tablet by mouth 4 (four) times daily.    Historical Provider, MD  Carboxymethylcellul-Glycerin ([place])  0.5-0.9 % SOLN Apply 1-2 drops to eye 3 (three) times daily as needed (for dry eyes).    Historical Provider, MD  cetirizine (ZYRTEC) 10 MG tablet Take 10 mg by mouth at bedtime.    Historical Provider, MD  clopidogrel (PLAVIX) 75 MG tablet Take 75 mg by mouth daily.    Historical Provider, MD  dextrose (GLUTOSE) 40 % GEL Take 1 Tube by mouth.    Historical Provider, MD  fluticasone (FLONASE) 50 MCG/ACT nasal spray Place 2 sprays into both nostrils daily as needed for rhinitis.    Historical Provider, MD  Fluticasone-Salmeterol (ADVAIR) 500-50 MCG/DOSE AEPB Inhale 1 puff into the lungs 2 (two) times daily.     Historical Provider, MD  gabapentin (NEURONTIN) 600 MG tablet Take 600 mg by mouth. 1 AM 1 SUPPERTIME 2 HS    Historical Provider, MD  guaiFENesin (MUCINEX) 600 MG 12 hr tablet Take 600  mg by mouth daily.    Historical Provider, MD  HYDROcodone-acetaminophen (NORCO/VICODIN) 5-325 MG tablet Take 1 tablet by mouth every 6 (six) hours as needed for moderate pain.    Historical Provider, MD  lidocaine (XYLOCAINE) 5 % ointment Apply 1 application topically 2 (two) times daily as needed. TO FEET    Historical Provider, MD  liver oil-zinc oxide (DESITIN) 40 % ointment Apply 1 application topically as needed for irritation.    Historical Provider, MD  metformin (GLUCOPHAGE-XR) 500 MG 24 hr tablet Take 1 tablet (500 mg total) by mouth daily. 12/30/15   Fritzi Mandes, MD  nitroGLYCERIN (NITROSTAT) 0.4 MG SL tablet Place 0.4 mg under the tongue every 5 (five) minutes as needed for chest pain.    Historical Provider, MD  ondansetron (ZOFRAN) 4 MG tablet Take 4 mg by mouth every 8 (eight) hours as needed for nausea or vomiting.    Historical Provider, MD  QUEtiapine (SEROQUEL) 25 MG tablet Take 25 mg by mouth at bedtime. 04/05/15   Historical Provider, MD  ranitidine (ZANTAC) 150 MG tablet Take 150 mg by mouth at bedtime.    Historical Provider, MD  roflumilast (DALIRESP) 500 MCG TABS tablet Take 500 mcg by mouth daily.    Historical Provider, MD  sennosides-docusate sodium (SENOKOT-S) 8.6-50 MG tablet Take 1 tablet by mouth 2 (two) times daily.    Historical Provider, MD  sulfamethoxazole-trimethoprim (BACTRIM DS,SEPTRA DS) 800-160 MG tablet Take 1 tablet by mouth 2 (two) times daily.    Historical Provider, MD  tiotropium (SPIRIVA) 18 MCG inhalation capsule Place 18 mcg into inhaler and inhale daily. 2 PUFFS    Historical Provider, MD  vitamin B-12 (CYANOCOBALAMIN) 1000 MCG tablet Take 1,000 mcg by mouth daily.    Historical Provider, MD  Vitamin D, Ergocalciferol, (DRISDOL) 50000 units CAPS capsule Take 50,000 Units by mouth every 30 (thirty) days.    Historical Provider, MD    Family History  Problem Relation Age of Onset  . Heart disease Father      Social History  Substance Use Topics    . Smoking status: Current Some Day Smoker    Packs/day: 0.50    Types: Cigarettes  . Smokeless tobacco: Never Used  . Alcohol use No    Allergies as of 03/02/2016  . (No Known Allergies)    Review of Systems:    All systems reviewed and negative except where noted in HPI.   Physical Exam:  Vital signs in last 24 hours: Temp:  [97.5 F (36.4 C)-98.6 F (37 C)] 98.3 F (36.8 C) (10/16  1338) Pulse Rate:  [70-85] 70 (10/16 1338) Resp:  [15-20] 20 (10/16 1338) BP: (72-125)/(33-76) 101/66 (10/16 1338) SpO2:  [93 %-100 %] 97 % (10/16 1338) Last BM Date: 03/02/16 General:   Pleasant, cooperative in NAD Head:  Normocephalic and atraumatic. Eyes:   No icterus.   Conjunctiva pink. PERRLA. Ears:  Normal auditory acuity. Neck:  Supple; no masses or thyroidomegaly Lungs: Respirations even and unlabored. Lungs clear to auscultation bilaterally.   No wheezes, crackles, or rhonchi.  Heart:  Regular rate and rhythm;  Without murmur, clicks, rubs or gallops Abdomen:  Soft, nondistended, nontender. Normal bowel sounds. No appreciable masses or hepatomegaly.  No rebound or guarding.  Rectal:  Not performed. Msk:  Symmetrical without gross deformities.    Extremities:  Without edema, cyanosis or clubbing. Neurologic:  Alert and oriented x3;  grossly normal neurologically. Skin:  Intact without significant lesions or rashes. Cervical Nodes:  No significant cervical adenopathy. Psych:  Alert and cooperative. Normal affect.  LAB RESULTS:  Recent Labs  03/02/16 1732 03/03/16 0406  WBC 9.6 9.3  HGB 13.5 13.7  HCT 39.2* 41.1  PLT 223 236   BMET  Recent Labs  03/02/16 1732 03/03/16 0406  NA 136 140  K 3.5 3.6  CL 103 110  CO2 28 25  GLUCOSE 137* 123*  BUN 16 14  CREATININE 1.00 0.73  CALCIUM 8.6* 8.6*   LFT No results for input(s): PROT, ALBUMIN, AST, ALT, ALKPHOS, BILITOT, BILIDIR, IBILI in the last 72 hours. PT/INR No results for input(s): LABPROT, INR in the last 72  hours.  STUDIES: Ct Head Wo Contrast  Result Date: 03/02/2016 CLINICAL DATA:  Left-sided weakness since 9:30 a.m. EXAM: CT HEAD WITHOUT CONTRAST TECHNIQUE: Contiguous axial images were obtained from the base of the skull through the vertex without intravenous contrast. COMPARISON:  12/28/2015 FINDINGS: Brain: No evidence of acute infarction, hemorrhage, hydrocephalus, extra-axial collection or mass lesion/mass effect. Generalized atrophy. No unexpected white matter ischemic change. Vascular: No hyperdense vessel or unexpected calcification. Skull: Normal. Negative for fracture or focal lesion. Sinuses/Orbits: Right cataract resection IMPRESSION: 1. No acute finding. 2. Moderate generalized atrophy. Electronically Signed   By: Monte Fantasia M.D.   On: 03/02/2016 21:12   Mr Brain Wo Contrast  Result Date: 03/03/2016 CLINICAL DATA:  75 year old male with dizziness and weakness since 0930 hours on 03/02/2016. Initial encounter. EXAM: MRI HEAD WITHOUT CONTRAST MRA HEAD WITHOUT CONTRAST TECHNIQUE: Multiplanar, multiecho pulse sequences of the brain and surrounding structures were obtained without intravenous contrast. Angiographic images of the head were obtained using MRA technique without contrast. COMPARISON:  Head CT without contrast 03/02/2016. Brain MRI 12/29/2015 FINDINGS: MRI HEAD FINDINGS Brain: No restricted diffusion to suggest acute infarction. No midline shift, mass effect, evidence of mass lesion, ventriculomegaly, extra-axial collection or acute intracranial hemorrhage. Cervicomedullary junction and pituitary are within normal limits. Stable cerebral volume. Mild mass effect on the left medulla is stable and related to distal left vertebral artery tortuosity. Stable scattered small nonspecific cerebral white matter T2 and FLAIR hyperintense foci, extent is mild for age. 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 Negative deep gray matter nuclei and brainstem. No  chronic cerebral blood products. Vascular: Major intracranial vascular flow voids are stable and appear normal aside from a mild degree of intracranial artery tortuosity. Skull and upper cervical spine: Stable partially visible upper cervical spine degeneration. Normal bone marrow signal. Sinuses/Orbits: Visualized paranasal sinuses and mastoids are stable and well pneumatized. Stable orbit and  scalp soft tissues. Other: Visible internal auditory structures appear normal. Mastoids are clear. MRA HEAD FINDINGS Mildly tortuous distal vertebral arteries with no stenosis. Both PICA origins appear normal. Patent vertebrobasilar junction. No basilar stenosis. Right AICA, bilateral SCA, and PCA origins are normal. Normal PCA branches. Posterior communicating arteries are diminutive or absent. Antegrade flow in both ICA siphons. Mild bilateral siphon irregularity in keeping with atherosclerosis but no stenosis. Normal ophthalmic artery origins. Normal carotid termini, MCA and ACA origins. Mildly fenestrated anterior communicating artery (normal variant). Negative visualized ACA branches. Minimal bilateral MCA M1 segment irregularity without stenosis. Negative visualized bilateral MCA branches. IMPRESSION: 1.  No acute intracranial abnormality. 2. Stable noncontrast MRI appearance the brain since August. Mild for age chronic small vessel ischemia, primarily in the right cerebellum. 3. Negative for age intracranial MRA; mild intracranial atherosclerosis without stenosis. Electronically Signed   By: Genevie Ann M.D.   On: 03/03/2016 10:14   Mr Cervical Spine Wo Contrast  Result Date: 03/03/2016 CLINICAL DATA:  Left-sided weakness since yesterday. History of Parkinson's disease and diabetes. EXAM: MRI CERVICAL SPINE WITHOUT CONTRAST TECHNIQUE: Multiplanar, multisequence MR imaging of the cervical spine was performed. No intravenous contrast was administered. COMPARISON:  MRI brain same date. FINDINGS: Alignment: Normal.  Vertebrae: No acute or suspicious osseous findings. Cord: There is mild cervical cord atrophy with focal myelomalacia just inferior to the C4-5 disc space level. No evidence of cord edema or hemorrhage. Posterior Fossa, vertebral arteries, paraspinal tissues: Mild atrophy within the posterior fossa. No paraspinal abnormalities are seen. Bilateral vertebral artery flow voids. Disc levels: C2-3: Disc height is maintained. There is asymmetric facet hypertrophy on the left without resulting spinal stenosis or nerve root encroachment. C3-4: Loss of disc height with annular disc bulging and endplate osteophytes asymmetric to the left. The CSF surrounding the cord is effaced without cord deformity. There is moderate left-greater-than-right foraminal narrowing. C4-5: There is spondylosis with loss of disc height, endplate osteophytes and a central disc protrusion. There is cord flattening with narrowing of the AP diameter of the canal to 5 mm. Moderate foraminal narrowing is present, left greater than right. C5-6: There is spondylosis with loss of disc height and an asymmetric left paracentral disc osteophyte complex. There is mild cord flattening on the left with narrowing of the AP diameter of the canal to 6 mm. There is moderate left-greater-than-right foraminal narrowing. C6-7: Chronic spondylosis with loss of disc height and endplate osteophytes asymmetric to the left. No cord deformity. Minimal foraminal narrowing bilaterally. C7-T1:  No significant findings. IMPRESSION: 1. Moderate size central disc protrusion at C4-5, contributing to cord flattening and moderate foraminal narrowing, left greater than right. 2. Mild left-sided cord flattening at C5-6 with left-greater-than-right foraminal narrowing. 3. Moderate foraminal narrowing is also present at C3-4 secondary to spondylosis. 4. Cord atrophy with small focus of myelomalacia inferior to the C4-5 disc space level per Electronically Signed   By: Richardean Sale M.D.    On: 03/03/2016 13:34   US Carotid Bilateral (at [facility] Only)  Result Date: 03/03/2016 CLINICAL DATA:  TIA. EXAM: BILATERAL CAROTID DUPLEX ULTRASOUND TECHNIQUE: Pearline Cables scale imaging, color Doppler and duplex ultrasound were performed of bilateral carotid and vertebral arteries in the neck. COMPARISON:  None. FINDINGS: Criteria: Quantification of carotid stenosis is based on velocity parameters that correlate the residual internal carotid diameter with NASCET-based stenosis levels, using the diameter of the distal internal carotid lumen as the denominator for stenosis measurement. The following velocity measurements were obtained: RIGHT  ICA:  102/35 cm/sec CCA:  [id number] cm/sec SYSTOLIC ICA/CCA RATIO:  0.9 DIASTOLIC ICA/CCA RATIO:  1.1 ECA:  151 cm/sec LEFT ICA:  99/29 cm/sec CCA:  99991111 cm/sec SYSTOLIC ICA/CCA RATIO:  0.9 DIASTOLIC ICA/CCA RATIO:  0.8 ECA:  153 cm/sec RIGHT CAROTID ARTERY: Bifurcation proximal ICA mild to moderate atherosclerotic vascular disease. No flow limiting stenosis. RIGHT VERTEBRAL ARTERY:  Patent with antegrade flow. LEFT CAROTID ARTERY: Left common carotid, carotid bifurcation, proximal ICA mild to moderate atherosclerotic vascular disease. No flow limiting stenosis. LEFT VERTEBRAL ARTERY:  Patent with antegrade flow. IMPRESSION: 1. 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 No flow limiting stenosis. Degree of stenosis less than 50%. 2. Left common carotid, carotid bifurcation, proximal ICA mild moderate atherosclerotic vascular disease. No flow limiting stenosis. Degree of stenosis less than 50%. 3. Vertebral arteries are patent with antegrade flow bilaterally. Electronically Signed   By: Marcello Moores  Register   On: 03/03/2016 10:55   Mr Jodene Nam Head/brain Wo Cm  Result Date: 03/03/2016 CLINICAL DATA:  75 year old male with dizziness and weakness since 0930 hours on 03/02/2016. Initial encounter. EXAM: MRI HEAD WITHOUT CONTRAST MRA  HEAD WITHOUT CONTRAST TECHNIQUE: Multiplanar, multiecho pulse sequences of the brain and surrounding structures were obtained without intravenous contrast. Angiographic images of the head were obtained using MRA technique without contrast. COMPARISON:  Head CT without contrast 03/02/2016. Brain MRI 12/29/2015 FINDINGS: MRI HEAD FINDINGS Brain: No restricted diffusion to suggest acute infarction. No midline shift, mass effect, evidence of mass lesion, ventriculomegaly, extra-axial collection or acute intracranial hemorrhage. Cervicomedullary junction and pituitary are within normal limits. Stable cerebral volume. Mild mass effect on the left medulla is stable and related to distal left vertebral artery tortuosity. Stable scattered small nonspecific cerebral white matter T2 and FLAIR hyperintense foci, extent is mild for age. 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 Negative deep gray matter nuclei and brainstem. No chronic cerebral blood products. Vascular: Major intracranial vascular flow voids are stable and appear normal aside from a mild degree of intracranial artery tortuosity. Skull and upper cervical spine: Stable partially visible upper cervical spine degeneration. Normal bone marrow signal. Sinuses/Orbits: Visualized paranasal sinuses and mastoids are stable and well pneumatized. Stable orbit and scalp soft tissues. Other: Visible internal auditory structures appear normal. Mastoids are clear. MRA HEAD FINDINGS Mildly tortuous distal vertebral arteries with no stenosis. Both PICA origins appear normal. Patent vertebrobasilar junction. No basilar stenosis. Right AICA, bilateral SCA, and PCA origins are normal. Normal PCA branches. Posterior communicating arteries are diminutive or absent. Antegrade flow in both ICA siphons. Mild bilateral siphon irregularity in keeping with atherosclerosis but no stenosis. Normal ophthalmic artery origins. Normal carotid termini, MCA and ACA  origins. Mildly fenestrated anterior communicating artery (normal variant). Negative visualized ACA branches. Minimal bilateral MCA M1 segment irregularity without stenosis. Negative visualized bilateral MCA branches. IMPRESSION: 1.  No acute intracranial abnormality. 2. Stable noncontrast MRI appearance the brain since August. Mild for age chronic small vessel ischemia, primarily in the right cerebellum. 3. Negative for age intracranial MRA; mild intracranial atherosclerosis without stenosis. Electronically Signed   By: Genevie Ann M.D.   On: 03/03/2016 10:14      Impression / Plan:   Jimmy Hill is a 75 y.o. y/o male with a normal hemoglobin and hematocrit who was found to have heme positive stools 2 weeks after a colonoscopy with the removal of 10 polyps. The patient denies having an upper endoscopy.  I  discussed the possibility of doing a upper endoscopy with the patient but he stated he would have to call his primary Physician.  The patient refused to consent to having the procedure without speaking to his PCP. There is no reason to repeat the colonoscopy at this time.  An upper endoscopy could be considered if the patient consents to it.  He has been told to let me know what he decides.   If he decides not to proceed with any GI workup then he should follow up with his primary gastroenterologist at [facility].  Thank you for involving me in the care of this patient.      LOS: 1 day   Jimmy Lame, MD  03/03/2016, 8:03 PM   Note: This dictation was prepared with Dragon dictation along with smaller phrase technology. Any transcriptional errors that result from this process are unintentional.
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
## 2016-03-03 NOTE — Progress Notes (Signed)
Pt very lethargic this morning.  Very irritated at being woke and goes right back to sleep.  Cannot do stroke assessment on him due to him not staying awake.  BP low, Dr. Posey Pronto made aware.  New order for 257ml NS bolus.  BP improved to 97/61.  Will continue to monitor.  Clarise Cruz, RN   03/03/16 0818  Vitals  Temp 97.7 F (36.5 C)  Temp Source Oral  BP (!) 83/55  MAP (mmHg) (!) 61  BP Location Right Arm  BP Method Automatic  Pulse Rate 78  Oxygen Therapy  SpO2 95 %  O2 Device Room Air

## 2016-03-03 NOTE — NC FL2 (Signed)
[place] LEVEL OF CARE SCREENING TOOL     IDENTIFICATION  Patient Name: [facility] Birthdate: May 04, 1941 Sex: male Admission Date (Current Location): 03/02/2016  [place] and [place] Number:  Engineering geologist and Address:  [facility], [address] [place], [place] [address]      Provider Number: [id number]  Attending Physician Name and Address:  Fritzi Mandes, MD  Relative Name and Phone Number:       Current Level of Care: Hospital Recommended Level of Care: [place] Prior Approval Number:    Date Approved/Denied:   PASRR Number: [id number]  Discharge Plan: SNF    Current Diagnoses: Patient Active Problem List   Diagnosis Date Noted  . GI bleed 03/02/2016  . Left-sided weakness 03/02/2016  . Pressure ulcer 12/29/2015  . Altered mental status 12/28/2015  . Fracture of femoral neck, left ([place]) 12/03/2015  . CAD (coronary artery disease) 12/03/2015  . Sleep apnea 12/03/2015  . Femoral neck fracture, left, closed, initial encounter 12/03/2015  . COPD (chronic obstructive pulmonary disease) ([place]) 02/05/2015  . Acute bronchitis 02/05/2015  . Type 2 diabetes mellitus ([place]) 02/05/2015  . Chest pain 12/09/2014    Orientation RESPIRATION BLADDER Height & Weight     Self, Place  Normal Incontinent Weight: 165 lb (74.8 kg) Height:  5\' 8"  (172.7 cm)  BEHAVIORAL SYMPTOMS/MOOD NEUROLOGICAL BOWEL NUTRITION STATUS   (None)  (None) Incontinent Diet (DYS 3)  AMBULATORY STATUS COMMUNICATION OF NEEDS Skin   Extensive Assist Verbally Normal                       Personal Care Assistance Level of Assistance  Bathing, Feeding, Dressing Bathing Assistance: Limited assistance Feeding assistance: Independent Dressing Assistance: Limited assistance     Functional Limitations Info  Sight, Hearing, Speech Sight Info: Adequate Hearing Info: Adequate Speech Info: Adequate    SPECIAL CARE FACTORS FREQUENCY   PT (By licensed PT)     PT Frequency:  (5)              Contractures      Additional Factors Info  Insulin Sliding Scale, Code Status, Allergies Code Status Info: Full Allergies Info: No Known Allergies   Insulin Sliding Scale Info: insulin aspart (novoLOG) injection 0-15 Units 0-15 Units, Subcutaneous, 3 times daily with meals  ; insulin aspart (novoLOG) injection 0-5 Units 0-5 Units, Subcutaneous, Daily at bedtime       Current Medications (03/03/2016):  This is the current hospital active medication list Current Facility-Administered Medications  Medication Dose Route Frequency Provider Last Rate Last Dose  . 0.9 %  sodium chloride infusion   Intravenous Continuous Fritzi Mandes, MD 100 mL/hr at 03/03/16 1545    . acetaminophen (TYLENOL) tablet 650 mg  650 mg Oral Q4H PRN Saundra Shelling, MD       Or  . acetaminophen (TYLENOL) suppository 650 mg  650 mg Rectal Q4H PRN Pavan Pyreddy, MD      . albuterol (PROVENTIL) (2.5 MG/3ML) 0.083% nebulizer solution 2.5 mg  2.5 mg Nebulization Q4H PRN Saundra Shelling, MD      . aspirin EC tablet 81 mg  81 mg Oral Once Delman Kitten, MD      . atorvastatin (LIPITOR) tablet 80 mg  80 mg Oral QHS Saundra Shelling, MD   80 mg at 03/03/16 0047  . timolol (TIMOPTIC) 0.5 % ophthalmic solution 1 drop  1 drop Both Eyes BID Saundra Shelling, MD  1 drop at 03/03/16 1214   And  . brimonidine (ALPHAGAN) 0.15 % ophthalmic solution 1 drop  1 drop Both Eyes BID Saundra Shelling, MD   1 drop at 03/03/16 1219  . calcium-vitamin D (OSCAL WITH D) 500-200 MG-UNIT per tablet 1 tablet  1 tablet Oral Daily Saundra Shelling, MD   1 tablet at 03/03/16 1217  . carbidopa-levodopa (SINEMET IR) 25-250 MG per tablet immediate release 1 tablet  1 tablet Oral QID Saundra Shelling, MD   1 tablet at 03/03/16 1217  . famotidine (PEPCID) IVPB 20 mg premix  20 mg Intravenous Q12H Saundra Shelling, MD   20 mg at 03/03/16 0046  . fluticasone (FLONASE) 50 MCG/ACT nasal spray 2 spray  2 spray Each Nare  Daily PRN Pavan Pyreddy, MD      . gabapentin (NEURONTIN) capsule 300 mg  300 mg Oral BID Saundra Shelling, MD   300 mg at 03/03/16 1216   And  . gabapentin (NEURONTIN) capsule 600 mg  600 mg Oral QHS Saundra Shelling, MD   600 mg at 03/03/16 0047  . guaiFENesin (MUCINEX) 12 hr tablet 600 mg  600 mg Oral Daily Saundra Shelling, MD   600 mg at 03/03/16 1217  . HYDROcodone-acetaminophen (NORCO/VICODIN) 5-325 MG per tablet 1 tablet  1 tablet Oral Q6H PRN Pavan Pyreddy, MD      . insulin aspart (novoLOG) injection 0-15 Units  0-15 Units Subcutaneous TID WC Pavan Pyreddy, MD      . insulin aspart (novoLOG) injection 0-5 Units  0-5 Units Subcutaneous QHS Pavan Pyreddy, MD      . loratadine (CLARITIN) tablet 10 mg  10 mg Oral Daily Saundra Shelling, MD   10 mg at 03/03/16 1216  . mometasone-formoterol (DULERA) 200-5 MCG/ACT inhaler 2 puff  2 puff Inhalation BID Saundra Shelling, MD   2 puff at 03/03/16 1215  . nitroGLYCERIN (NITROSTAT) SL tablet 0.4 mg  0.4 mg Sublingual Q5 min PRN Saundra Shelling, MD      . Derrill Memo ON 03/04/2016] pneumococcal 23 valent vaccine (PNU-IMMUNE) injection 0.5 mL  0.5 mL Intramuscular Tomorrow-1000 Pavan Pyreddy, MD      . polyvinyl alcohol (LIQUIFILM TEARS) 1.4 % ophthalmic solution 1-2 drop  1-2 drop Both Eyes TID PRN Pavan Pyreddy, MD      . QUEtiapine (SEROQUEL) tablet 25 mg  25 mg Oral QHS Saundra Shelling, MD   25 mg at 03/03/16 0047  . roflumilast (DALIRESP) tablet 500 mcg  500 mcg Oral Daily Saundra Shelling, MD   500 mcg at 03/03/16 1216  . senna-docusate (Senokot-S) tablet 1 tablet  1 tablet Oral BID Saundra Shelling, MD   1 tablet at 03/03/16 1217  . tiotropium (SPIRIVA) inhalation capsule 18 mcg  18 mcg Inhalation Daily Saundra Shelling, MD   18 mcg at 03/03/16 1215  . vitamin B-12 (CYANOCOBALAMIN) tablet 1,000 mcg  1,000 mcg Oral Daily Saundra Shelling, MD   1,000 mcg at 03/03/16 1216  . [START ON 03/07/2016] Vitamin D (Ergocalciferol) (DRISDOL) capsule 50,000 Units  50,000 Units Oral Q30 days  Saundra Shelling, MD         Discharge Medications: Please see discharge summary for a list of discharge medications.  Relevant Imaging Results:  Relevant Lab Results:   Additional Information SSN [id number]  Lorenso Quarry Nyleah Mcginnis, LCSW

## 2016-03-04 ENCOUNTER — Inpatient Hospital Stay: Payer: Medicare (Managed Care) | Admitting: Anesthesiology

## 2016-03-04 ENCOUNTER — Encounter
Admission: EM | Disposition: A | Discharge status: Home / Self Care | Payer: Self-pay | Source: Home / Self Care | Attending: Internal Medicine

## 2016-03-04 DIAGNOSIS — M4802 Spinal stenosis, cervical region: Principal | ICD-10-CM

## 2016-03-04 DIAGNOSIS — K2970 Gastritis, unspecified, without bleeding: Secondary | ICD-10-CM

## 2016-03-04 DIAGNOSIS — R634 Abnormal weight loss: Secondary | ICD-10-CM

## 2016-03-04 HISTORY — PX: ESOPHAGOGASTRODUODENOSCOPY (EGD) WITH PROPOFOL: SHX5813

## 2016-03-04 LAB — HEMOGLOBIN A1C
HEMOGLOBIN A1C: 5.9 % — AB (ref 4.8–5.6)
MEAN PLASMA GLUCOSE: 123 mg/dL

## 2016-03-04 LAB — GLUCOSE, CAPILLARY: Glucose-Capillary: 124 mg/dL — ABNORMAL HIGH (ref 65–99)

## 2016-03-04 SURGERY — ESOPHAGOGASTRODUODENOSCOPY (EGD) WITH PROPOFOL
Anesthesia: General

## 2016-03-04 MED ORDER — FAMOTIDINE 20 MG PO TABS: 20.0000 mg | ORAL_TABLET | Freq: Every day | ORAL | Status: DC

## 2016-03-04 MED ORDER — FENTANYL CITRATE (PF) 100 MCG/2ML IJ SOLN
INTRAMUSCULAR | Status: DC | PRN
  Administered 2016-03-04: 50 ug via INTRAVENOUS

## 2016-03-04 MED ORDER — LIDOCAINE 2% (20 MG/ML) 5 ML SYRINGE
INTRAMUSCULAR | Status: DC | PRN
  Administered 2016-03-04: 40 mg via INTRAVENOUS

## 2016-03-04 MED ORDER — SODIUM CHLORIDE 0.9 % IV SOLN
INTRAVENOUS | Status: DC | PRN
  Administered 2016-03-04: 11:00:00 via INTRAVENOUS

## 2016-03-04 MED ORDER — EPHEDRINE SULFATE 50 MG/ML IJ SOLN
INTRAMUSCULAR | Status: DC | PRN
  Administered 2016-03-04: 10 mg via INTRAVENOUS

## 2016-03-04 MED ORDER — PROPOFOL 10 MG/ML IV BOLUS
INTRAVENOUS | Status: DC | PRN
  Administered 2016-03-04: 100 mg via INTRAVENOUS

## 2016-03-04 MED ORDER — PROPOFOL 500 MG/50ML IV EMUL
INTRAVENOUS | Status: DC | PRN
  Administered 2016-03-04: 140 ug/kg/min via INTRAVENOUS

## 2016-03-04 NOTE — Progress Notes (Signed)
Pt transported home via EMS

## 2016-03-04 NOTE — Care Management (Signed)
Spoke with [name] at [facility]. [facility] will need EMS transportation to home this afternoon.  Discharge summary faxed to [facility]. Shelbie Ammons RN MSN CCM Care Management [phone]

## 2016-03-04 NOTE — Anesthesia Preprocedure Evaluation (Signed)
Anesthesia Evaluation  Patient identified by MRN, date of birth, ID band Patient awake    Reviewed: Allergy & Precautions, NPO status , Patient's Chart, lab work & pertinent test results  Airway Mallampati: II       Dental  (+) Upper Dentures, Lower Dentures   Pulmonary sleep apnea , COPD,  COPD inhaler, Current Smoker,     + decreased breath sounds      Cardiovascular Exercise Tolerance: Poor + CAD, + Past MI and +CHF   Rhythm:Regular Rate:Normal     Neuro/Psych Depression    GI/Hepatic Neg liver ROS, GERD  Medicated,  Endo/Other  diabetes, Type 2, Oral Hypoglycemic Agents  Renal/GU      Musculoskeletal   Abdominal Normal abdominal exam  (+)   Peds  Hematology   Anesthesia Other Findings   Reproductive/Obstetrics                             Anesthesia Physical Anesthesia Plan  ASA: III  Anesthesia Plan: General   Post-op Pain Management:    Induction: Intravenous  Airway Management Planned: Natural Airway and Nasal Cannula  Additional Equipment:   Intra-op Plan:   Post-operative Plan:   Informed Consent: I have reviewed the patients History and Physical, chart, labs and discussed the procedure including the risks, benefits and alternatives for the proposed anesthesia with the patient or authorized representative who has indicated his/her understanding and acceptance.     Plan Discussed with: CRNA  Anesthesia Plan Comments:         Anesthesia Quick Evaluation

## 2016-03-04 NOTE — Discharge Summary (Signed)
[facility] at [place] NAME: Long Intriago    Jimmy#:  [id number]  DATE OF BIRTH:  1940/11/21  DATE OF ADMISSION:  03/02/2016 ADMITTING PHYSICIAN: Saundra Shelling, MD  DATE OF DISCHARGE: 03/04/16  PRIMARY CARE PHYSICIAN: Gareth Morgan, MD    ADMISSION DIAGNOSIS:  Left-sided muscle weakness [M62.81] Occult GI bleeding [R19.5]  DISCHARGE DIAGNOSIS:  Cervical spinal stenosis severe-f/u Neuro surgery as outpt Gastritis on EGD  SECONDARY DIAGNOSIS:   Past Medical History:  Diagnosis Date  . Allergic rhinitis   . Anginal pain ([place])   . Arthritis   . Autonomic dysreflexia   . Back pain    CHRONIC LBP,STENOSIS  . CHF (congestive heart failure) ([place])   . Chronic kidney disease    STAGE 3  . COPD (chronic obstructive pulmonary disease) ([place])   . Coronary artery disease   . Depression   . Diabetes ([place])   . Diabetes mellitus without complication ([place])   . DJD (degenerative joint disease)   . Dysphagia   . Edema    FEET/ANKLES  . GERD (gastroesophageal reflux disease)   . Glaucoma   . H/O orthostatic hypotension   . Myocardial infarction   . Parkinson's disease ([place])   . Peripheral vascular disease ([place])   . Polyneuropathy ([place])   . RLS (restless legs syndrome)   . Sleep apnea   . Smoker   . Strabismic amblyopia   . Tinnitus     HOSPITAL COURSE:   WillieGregoryis a 75 y.o.malewith a known history of arthritis, congestive heart failure, chronic kidney disease stage III, COPD, coronary artery disease, diabetes mellitus type 2, GERD, Parkinson's disease, peripheral vascular disease presented to the emergency room with dizziness and weakness since 9:30 AM today morning.Patient felt weak on the left side more than right side.He felt hehad weakness in the left arm and left lower extremity no complaints of any slurred speech    1.Left-sided weakness Appears secondary to cervical DJD with worse cervical arthritis with  stenosis on more on the left than the right -pt will see Dr Barry Dienes with [facility] on [facility] - patient has left upper extremity weakness. Occupation therapy recommends extensive upper extremity strengthening exercises -MRI negative for stroke -Appreciated neurological input -PT recommends Rehab but pt refusing. He is worried about his dog.   2.Parkinson's disease continue carbidopa levodopa and gabapentin -Patient is on quite a high dose of gabapentin will ask pharmacy to see if it needs to be adjusted  3.Type 2 diabetes mellitus - sugar stable continue home meds   4.Stable congestive heart failure  5. Positive occult stools -Patient had colonoscopy done 02/19/2016 at [facility] which showed polyps that were removed about 10.  6. Subjective weight loss according to patient and patient's daughter -Primary care physician aware about weight loss at [facility] -Daughter was Curious about cancer screening. Patient has routine labs within normal limits -Colonoscopy was negative for any mass -GI recommends EGD. Pt agreeable. EGD showed mild gastritis. Cont antacid rx -Chest x-ray  in the past does not show any lung mass -Patient is tolerating Ensure well. -Discussed with daughter to follow-up this with primary care physician at [facility].  D/w [facility]  D/[facility] with [facility] thru [facility] CONSULTS OBTAINED:  Treatment Team:  Alexis Goodell, MD [name], MD  DRUG ALLERGIES:  No Known Allergies  DISCHARGE MEDICATIONS:   Current Discharge Medication List    CONTINUE these medications which have  NOT CHANGED   Details  acetaminophen (TYLENOL) 325 MG tablet Take 650 mg by mouth every 4 (four) hours as needed for mild pain or headache.     albuterol (PROVENTIL) (2.5 MG/3ML) 0.083% nebulizer solution Take 2.5 mg by nebulization every 4 (four) hours as needed for wheezing or shortness of breath.    aspirin EC 81 MG tablet Take 81 mg by mouth at  bedtime.    atorvastatin (LIPITOR) 80 MG tablet Take 80 mg by mouth at bedtime.    brimonidine-timolol (COMBIGAN) 0.2-0.5 % ophthalmic solution Place 1 drop into both eyes 2 (two) times daily.    Calcium Carbonate-Vitamin D (CALCIUM 600+D) 600-400 MG-UNIT per tablet Take 1 tablet by mouth daily.    carbidopa-levodopa (SINEMET IR) 25-250 MG tablet Take 1 tablet by mouth 4 (four) times daily.    Carboxymethylcellul-Glycerin (OPTIVE) 0.5-0.9 % SOLN Apply 1-2 drops to eye 3 (three) times daily as needed (for dry eyes).    cetirizine (ZYRTEC) 10 MG tablet Take 10 mg by mouth at bedtime.    clopidogrel (PLAVIX) 75 MG tablet Take 75 mg by mouth daily.    dextrose (GLUTOSE) 40 % GEL Take 1 Tube by mouth.    fluticasone (FLONASE) 50 MCG/ACT nasal spray Place 2 sprays into both nostrils daily as needed for rhinitis.    Fluticasone-Salmeterol (ADVAIR) 500-50 MCG/DOSE AEPB Inhale 1 puff into the lungs 2 (two) times daily.     gabapentin (NEURONTIN) 600 MG tablet Take 600 mg by mouth. 1 AM 1 SUPPERTIME 2 HS    guaiFENesin (MUCINEX) 600 MG 12 hr tablet Take 600 mg by mouth daily.    HYDROcodone-acetaminophen (NORCO/VICODIN) 5-325 MG tablet Take 1 tablet by mouth every 6 (six) hours as needed for moderate pain.    lidocaine (XYLOCAINE) 5 % ointment Apply 1 application topically 2 (two) times daily as needed. TO FEET    liver oil-zinc oxide (DESITIN) 40 % ointment Apply 1 application topically as needed for irritation.    metformin (GLUCOPHAGE-XR) 500 MG 24 hr tablet Take 1 tablet (500 mg total) by mouth daily. Qty: 30 tablet, Refills: 1    nitroGLYCERIN (NITROSTAT) 0.4 MG SL tablet Place 0.4 mg under the tongue every 5 (five) minutes as needed for chest pain.    ondansetron (ZOFRAN) 4 MG tablet Take 4 mg by mouth every 8 (eight) hours as needed for nausea or vomiting.    QUEtiapine (SEROQUEL) 25 MG tablet Take 25 mg by mouth at bedtime.    ranitidine (ZANTAC) 150 MG tablet Take 150 mg by  mouth at bedtime.    roflumilast (DALIRESP) 500 MCG TABS tablet Take 500 mcg by mouth daily.    sennosides-docusate sodium (SENOKOT-S) 8.6-50 MG tablet Take 1 tablet by mouth 2 (two) times daily.    sulfamethoxazole-trimethoprim (BACTRIM DS,SEPTRA DS) 800-160 MG tablet Take 1 tablet by mouth 2 (two) times daily.    tiotropium (SPIRIVA) 18 MCG inhalation capsule Place 18 mcg into inhaler and inhale daily. 2 PUFFS    vitamin B-12 (CYANOCOBALAMIN) 1000 MCG tablet Take 1,000 mcg by mouth daily.    Vitamin D, Ergocalciferol, (DRISDOL) 50000 units CAPS capsule Take 50,000 Units by mouth every 30 (thirty) days.        If you experience worsening of your admission symptoms, develop shortness of breath, life threatening emergency, suicidal or homicidal thoughts you must seek medical attention immediately by calling 911 or calling your MD immediately  if symptoms less severe.  You Must read complete instructions/literature along with  all the possible adverse reactions/side effects for all the Medicines you take and that have been prescribed to you. Take any new Medicines after you have completely understood and accept all the possible adverse reactions/side effects.   Please note  You were cared for by a hospitalist during your hospital stay. If you have any questions about your discharge medications or the care you received while you were in the hospital after you are discharged, you can call the unit and asked to speak with the hospitalist on call if the hospitalist that took care of you is not available. Once you are discharged, your primary care physician will handle any further medical issues. Please note that NO REFILLS for any discharge medications will be authorized once you are discharged, as it is imperative that you return to your primary care physician (or establish a relationship with a primary care physician if you do not have one) for your aftercare needs so that they can reassess your  need for medications and monitor your lab values. Today   SUBJECTIVE   No new complaints  VITAL SIGNS:  Blood pressure (!) 109/59, pulse 76, temperature 97.7 F (36.5 C), temperature source Oral, resp. rate 16, height 5\' 8"  (1.727 m), weight 74.8 kg (165 lb), SpO2 96 %.  I/O:   Intake/Output Summary (Last 24 hours) at 03/04/16 1248 Last data filed at 03/04/16 0950  Gross per 24 hour  Intake          1741.25 ml  Output             1375 ml  Net           366.25 ml    PHYSICAL EXAMINATION:  GENERAL:  75 y.o.-year-old patient lying in the bed with no acute distress.  EYES: Pupils equal, round, reactive to light and accommodation. No scleral icterus. Extraocular muscles intact.  HEENT: Head atraumatic, normocephalic. Oropharynx and nasopharynx clear.  NECK:  Supple, no jugular venous distention. No thyroid enlargement, no tenderness.  LUNGS: Normal breath sounds bilaterally, no wheezing, rales,rhonchi or crepitation. No use of accessory muscles of respiration.  CARDIOVASCULAR: S1, S2 normal. No murmurs, rubs, or gallops.  ABDOMEN: Soft, non-tender, non-distended. Bowel sounds present. No organomegaly or mass.  EXTREMITIES: No pedal edema, cyanosis, or clubbing.  NEUROLOGIC: Cranial nerves II through XII are intact. Muscle strength 5/5 in all extremities. Sensation intact. Gait not checked. Mild left UE weakness+ PSYCHIATRIC: The patient is alert and oriented x 3.  SKIN: No obvious rash, lesion, or ulcer.   DATA REVIEW:   CBC   Recent Labs Lab 03/03/16 0406  WBC 9.3  HGB 13.7  HCT 41.1  PLT 236    Chemistries   Recent Labs Lab 03/03/16 0406  NA 140  K 3.6  CL 110  CO2 25  GLUCOSE 123*  BUN 14  CREATININE 0.73  CALCIUM 8.6*    Microbiology Results   No results found for this or any previous visit (from the past 240 hour(s)).  RADIOLOGY:  Ct Head Wo Hill  Result Date: 03/02/2016 CLINICAL DATA:  Left-sided weakness since 9:30 a.m. EXAM: CT HEAD WITHOUT  Hill TECHNIQUE: Contiguous axial images were obtained from the base of the skull through the vertex without intravenous Hill. COMPARISON:  12/28/2015 FINDINGS: Brain: No evidence of acute infarction, hemorrhage, hydrocephalus, extra-axial collection or mass lesion/mass effect. Generalized atrophy. No unexpected white matter ischemic change. Vascular: No hyperdense vessel or unexpected calcification. Skull: Normal. Negative for fracture or focal lesion. Sinuses/Orbits: Right  cataract resection IMPRESSION: 1. No acute finding. 2. Moderate generalized atrophy. Electronically Signed   By: Monte Fantasia M.D.   On: 03/02/2016 21:12   Jimmy Hill  Result Date: 03/03/2016 CLINICAL DATA:  75 year old male with dizziness and weakness since 0930 hours on 03/02/2016. Initial encounter. EXAM: MRI HEAD WITHOUT Hill MRA HEAD WITHOUT Hill TECHNIQUE: Multiplanar, multiecho pulse sequences of the brain and surrounding structures were obtained without intravenous Hill. Angiographic images of the head were obtained using MRA technique without Hill. COMPARISON:  Head CT without Hill 03/02/2016. Brain MRI 12/29/2015 FINDINGS: MRI HEAD FINDINGS Brain: No restricted diffusion to suggest acute infarction. No midline shift, mass effect, evidence of mass lesion, ventriculomegaly, extra-axial collection or acute intracranial hemorrhage. Cervicomedullary junction and pituitary are within normal limits. Stable cerebral volume. Mild mass effect on the left medulla is stable and related to distal left vertebral artery tortuosity. Stable scattered small nonspecific cerebral white matter T2 and FLAIR hyperintense foci, extent is mild for age. 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 Negative deep gray matter nuclei and brainstem. No chronic cerebral blood products. Vascular: Major intracranial vascular flow voids are stable and appear normal aside from a mild degree of  intracranial artery tortuosity. Skull and upper cervical spine: Stable partially visible upper cervical spine degeneration. Normal bone marrow signal. Sinuses/Orbits: Visualized paranasal sinuses and mastoids are stable and well pneumatized. Stable orbit and scalp soft tissues. Other: Visible internal auditory structures appear normal. Mastoids are clear. MRA HEAD FINDINGS Mildly tortuous distal vertebral arteries with no stenosis. Both PICA origins appear normal. Patent vertebrobasilar junction. No basilar stenosis. Right AICA, bilateral SCA, and PCA origins are normal. Normal PCA branches. Posterior communicating arteries are diminutive or absent. Antegrade flow in both ICA siphons. Mild bilateral siphon irregularity in keeping with atherosclerosis but no stenosis. Normal ophthalmic artery origins. Normal carotid termini, MCA and ACA origins. Mildly fenestrated anterior communicating artery (normal variant). Negative visualized ACA branches. Minimal bilateral MCA M1 segment irregularity without stenosis. Negative visualized bilateral MCA branches. IMPRESSION: 1.  No acute intracranial abnormality. 2. Stable noncontrast MRI appearance the brain since August. Mild for age chronic small vessel ischemia, primarily in the right cerebellum. 3. Negative for age intracranial MRA; mild intracranial atherosclerosis without stenosis. Electronically Signed   By: Genevie Ann M.D.   On: 03/03/2016 10:14   Jimmy Hill  Result Date: 03/03/2016 CLINICAL DATA:  Left-sided weakness since yesterday. History of Parkinson's disease and diabetes. EXAM: MRI CERVICAL SPINE WITHOUT Hill TECHNIQUE: Multiplanar, multisequence Jimmy imaging of the cervical spine was performed. No intravenous Hill was administered. COMPARISON:  MRI brain same date. FINDINGS: Alignment: Normal. Vertebrae: No acute or suspicious osseous findings. Cord: There is mild cervical cord atrophy with focal myelomalacia just inferior to the C4-5  disc space level. No evidence of cord edema or hemorrhage. Posterior Fossa, vertebral arteries, paraspinal tissues: Mild atrophy within the posterior fossa. No paraspinal abnormalities are seen. Bilateral vertebral artery flow voids. Disc levels: C2-3: Disc height is maintained. There is asymmetric facet hypertrophy on the left without resulting spinal stenosis or nerve root encroachment. C3-4: Loss of disc height with annular disc bulging and endplate osteophytes asymmetric to the left. The CSF surrounding the cord is effaced without cord deformity. There is moderate left-greater-than-right foraminal narrowing. C4-5: There is spondylosis with loss of disc height, endplate osteophytes and a central disc protrusion. There is cord flattening with narrowing of the AP diameter of the canal to 5  mm. Moderate foraminal narrowing is present, left greater than right. C5-6: There is spondylosis with loss of disc height and an asymmetric left paracentral disc osteophyte complex. There is mild cord flattening on the left with narrowing of the AP diameter of the canal to 6 mm. There is moderate left-greater-than-right foraminal narrowing. C6-7: Chronic spondylosis with loss of disc height and endplate osteophytes asymmetric to the left. No cord deformity. Minimal foraminal narrowing bilaterally. C7-T1:  No significant findings. IMPRESSION: 1. Moderate size central disc protrusion at C4-5, contributing to cord flattening and moderate foraminal narrowing, left greater than right. 2. Mild left-sided cord flattening at C5-6 with left-greater-than-right foraminal narrowing. 3. Moderate foraminal narrowing is also present at C3-4 secondary to spondylosis. 4. Cord atrophy with small focus of myelomalacia inferior to the C4-5 disc space level per Electronically Signed   By: Richardean Sale M.D.   On: 03/03/2016 13:34   US Carotid Bilateral (at [facility] Only)  Result Date: 03/03/2016 CLINICAL DATA:  TIA. EXAM: BILATERAL CAROTID  DUPLEX ULTRASOUND TECHNIQUE: Pearline Cables scale imaging, color Doppler and duplex ultrasound were performed of bilateral carotid and vertebral arteries in the neck. COMPARISON:  None. FINDINGS: Criteria: Quantification of carotid stenosis is based on velocity parameters that correlate the residual internal carotid diameter with NASCET-based stenosis levels, using the diameter of the distal internal carotid lumen as the denominator for stenosis measurement. The following velocity measurements were obtained: RIGHT ICA:  102/35 Hill/sec CCA:  [id number] Hill/sec SYSTOLIC ICA/CCA RATIO:  0.9 DIASTOLIC ICA/CCA RATIO:  1.1 ECA:  151 Hill/sec LEFT ICA:  99/29 Hill/sec CCA:  [id number] Hill/sec SYSTOLIC ICA/CCA RATIO:  0.9 DIASTOLIC ICA/CCA RATIO:  0.8 ECA:  153 Hill/sec RIGHT CAROTID ARTERY: Bifurcation proximal ICA mild to moderate atherosclerotic vascular disease. No flow limiting stenosis. RIGHT VERTEBRAL ARTERY:  Patent with antegrade flow. LEFT CAROTID ARTERY: Left common carotid, carotid bifurcation, proximal ICA mild to moderate atherosclerotic vascular disease. No flow limiting stenosis. LEFT VERTEBRAL ARTERY:  Patent with antegrade flow. IMPRESSION: 1. 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 No flow limiting stenosis. Degree of stenosis less than 50%. 2. Left common carotid, carotid bifurcation, proximal ICA mild moderate atherosclerotic vascular disease. No flow limiting stenosis. Degree of stenosis less than 50%. 3. Vertebral arteries are patent with antegrade flow bilaterally. Electronically Signed   By: Marcello Moores  Register   On: 03/03/2016 10:55   Jimmy Hill  Result Date: 03/03/2016 CLINICAL DATA:  75 year old male with dizziness and weakness since 0930 hours on 03/02/2016. Initial encounter. EXAM: MRI HEAD WITHOUT Hill MRA HEAD WITHOUT Hill TECHNIQUE: Multiplanar, multiecho pulse sequences of the brain and surrounding structures were obtained without  intravenous Hill. Angiographic images of the head were obtained using MRA technique without Hill. COMPARISON:  Head CT without Hill 03/02/2016. Brain MRI 12/29/2015 FINDINGS: MRI HEAD FINDINGS Brain: No restricted diffusion to suggest acute infarction. No midline shift, mass effect, evidence of mass lesion, ventriculomegaly, extra-axial collection or acute intracranial hemorrhage. Cervicomedullary junction and pituitary are within normal limits. Stable cerebral volume. Mild mass effect on the left medulla is stable and related to distal left vertebral artery tortuosity. Stable scattered small nonspecific cerebral white matter T2 and FLAIR hyperintense foci, extent is mild for age. 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 Negative deep gray matter nuclei and brainstem. No chronic cerebral blood products. Vascular: Major intracranial vascular flow voids are stable and appear normal aside from a mild degree of intracranial  artery tortuosity. Skull and upper cervical spine: Stable partially visible upper cervical spine degeneration. Normal bone marrow signal. Sinuses/Orbits: Visualized paranasal sinuses and mastoids are stable and well pneumatized. Stable orbit and scalp soft tissues. Other: Visible internal auditory structures appear normal. Mastoids are clear. MRA HEAD FINDINGS Mildly tortuous distal vertebral arteries with no stenosis. Both PICA origins appear normal. Patent vertebrobasilar junction. No basilar stenosis. Right AICA, bilateral SCA, and PCA origins are normal. Normal PCA branches. Posterior communicating arteries are diminutive or absent. Antegrade flow in both ICA siphons. Mild bilateral siphon irregularity in keeping with atherosclerosis but no stenosis. Normal ophthalmic artery origins. Normal carotid termini, MCA and ACA origins. Mildly fenestrated anterior communicating artery (normal variant). Negative visualized ACA branches. Minimal bilateral MCA  M1 segment irregularity without stenosis. Negative visualized bilateral MCA branches. IMPRESSION: 1.  No acute intracranial abnormality. 2. Stable noncontrast MRI appearance the brain since August. Mild for age chronic small vessel ischemia, primarily in the right cerebellum. 3. Negative for age intracranial MRA; mild intracranial atherosclerosis without stenosis. Electronically Signed   By: Genevie Ann M.D.   On: 03/03/2016 10:14     Management plans discussed with the patient, family and they are in agreement.  CODE STATUS:     Code Status Orders        Start     Ordered   03/03/16 0013  Full code  Continuous     03/03/16 0013    Code Status History    Date Active Date Inactive Code Status Order ID Comments User Context   12/28/2015  5:18 PM 12/30/2015  8:08 PM Full Code [id number]  Henreitta Leber, MD Inpatient   12/03/2015  8:59 PM 12/04/2015  3:30 PM Full Code [id number]  Thornton Park, MD ED   02/05/2015  4:14 PM 02/06/2015  2:04 PM Full Code [id number]  Theodoro Grist, MD Inpatient   12/10/2014 12:47 AM 12/10/2014  4:36 PM Full Code [id number]  Nicholes Mango, MD Inpatient    Advance Directive Documentation   Flowsheet Row Most Recent Value  Type of Advance Directive  Living will, Healthcare Power of Attorney  Pre-existing out of facility DNR order (yellow form or pink MOST form)  No data  "MOST" Form in Place?  No data      TOTAL TIME TAKING CARE OF THIS PATIENT: 40 minutes.    Jenalyn Girdner M.D on 03/04/2016 at 12:48 PM  Between 7am to 6pm - Pager - [phone] After 6pm go to [email or web address] - password [email or web address] [facility]  Office  [phone]  CC: Primary care physician; Gareth Morgan, MD

## 2016-03-04 NOTE — Clinical Social Work Note (Signed)
Clinical Social Work Assessment  Patient Details  Name: Jimmy Hill MRN: [id number] Date of Birth: 1940/08/21  Date of referral:  03/04/16               Reason for consult:  Discharge Planning                Permission sought to share information with:    Permission granted to share information::     Name::        Agency::     Relationship::     Contact Information:     Housing/Transportation Living arrangements for the past 2 months:  Single Family Home Source of Information:  Patient Patient Interpreter Needed:  None Criminal Activity/Legal Involvement Pertinent to Current Situation/Hospitalization:  No - Comment as needed Significant Relationships:  Adult Children Lives with:  Self Do you feel safe going back to the place where you live?  Yes Need for family participation in patient care:  No (Coment)  Care giving concerns:  PT recommended that patient will benefit from SNF placement.   Social Worker assessment / plan:  CSW met with patient at bedside. Introduced herself and her role. Patient reports that [facility]. Reported that he's not interested in STR placement. Reported that he has dogs that he wants to get back to. CSW informed RNCM of above. CSW is signing off but is available if a need were to arise.   Employment status:  Retired Nurse, adult PT Recommendations:  [place] / Referral to community resources:  [place]  Patient/Family's Response to care:  Patient declined STR placement.   Patient/Family's Understanding of and Emotional Response to Diagnosis, Current Treatment, and Prognosis:  Stated he understood. Thanked CSW for assistance.   Emotional Assessment Appearance:  Appears stated age Attitude/Demeanor/Rapport:   (None) Affect (typically observed):  Calm Orientation:  Oriented to Self, Oriented to Place, Oriented to  Time, Oriented to Situation Alcohol / Substance  use:  Not Applicable Psych involvement (Current and /or in the community):  No (Comment)  Discharge Needs  Concerns to be addressed:  Discharge Planning Concerns Readmission within the last 30 days:  No Current discharge risk:  Chronically ill Barriers to Discharge:  Continued Medical Work up   [employer], LCSW 03/04/2016, 9:23 AM

## 2016-03-04 NOTE — Op Note (Signed)
[facility] Gastroenterology Patient Name: Jimmy Hill Procedure Date: 03/04/2016 10:57 AM MRN: [id number] Account #: [id number] Date of Birth: 05/22/1940 Admit Type: Inpatient Age: 75 Room: [facility] ENDO ROOM 4 Gender: Male Note Status: Finalized Procedure:            Upper GI endoscopy Indications:          Heme positive stool, Weight loss Providers:            [name] MD, MD Referring MD:         Shelly Coss. Coralie Common (Referring MD) Medicines:            Propofol per Anesthesia Complications:        No immediate complications. Procedure:            Pre-Anesthesia Assessment:                       - Prior to the procedure, a History and Physical was                        performed, and patient medications and allergies were                        reviewed. The patient's tolerance of previous                        anesthesia was also reviewed. The risks and benefits of                        the procedure and the sedation options and risks were                        discussed with the patient. All questions were                        answered, and informed consent was obtained. Prior                        Anticoagulants: The patient has taken no previous                        anticoagulant or antiplatelet agents. ASA Grade                        Assessment: II - A patient with mild systemic disease.                        After reviewing the risks and benefits, the patient was                        deemed in satisfactory condition to undergo the                        procedure.                       After obtaining informed consent, the endoscope was                        passed under direct vision. Throughout the procedure,  the patient's blood pressure, pulse, and oxygen                        saturations were monitored continuously. The Endoscope                        was introduced through the mouth, and advanced to the                 second part of duodenum. The upper GI endoscopy was                        accomplished without difficulty. The patient tolerated                        the procedure well. Findings:      The examined esophagus was normal.      Localized moderate inflammation characterized by erythema was found in       the gastric antrum. Biopsies were taken with a cold forceps for       histology.      The examined duodenum was normal. Biopsies were taken with a cold       forceps for histology. Impression:           - Normal esophagus.                       - Gastritis. Biopsied.                       - Normal examined duodenum. Biopsied. Recommendation:       - Await pathology results.                       - Return patient to hospital ward for ongoing care. Procedure Code(s):    --- Professional ---                       [phone], Esophagogastroduodenoscopy, flexible, transoral;                        with biopsy, single or multiple Diagnosis Code(s):    --- Professional ---                       R19.5, Other fecal abnormalities                       R63.4, Abnormal weight loss                       K29.70, Gastritis, unspecified, without bleeding CPT copyright 2016 American Medical Association. All rights reserved. The codes documented in this report are preliminary and upon coder review may  be revised to meet current compliance requirements. [name] MD, MD 03/04/2016 11:08:58 AM This report has been signed electronically. Number of Addenda: 0 Note Initiated On: 03/04/2016 10:57 AM      [facility]

## 2016-03-04 NOTE — Progress Notes (Signed)
Jerrell Belfast, RN called back and stated that pt's neighbor has the key to pt's house and that his home care RN will meet him at his apartment.  EMS contacted for transport.  Clarise Cruz, RN

## 2016-03-04 NOTE — Progress Notes (Signed)
[facility] [place] NAME: Gwin Hetchler    MR#:  [id number]  DATE OF BIRTH:  04-15-1941  SUBJECTIVE:  Came in after having left upper extremity weakness more than right.  REVIEW OF SYSTEMS:   Review of Systems  Constitutional: Positive for weight loss. Negative for chills and fever.  HENT: Negative for ear discharge, ear pain and nosebleeds.   Eyes: Negative for blurred vision, pain and discharge.  Respiratory: Negative for sputum production, shortness of breath, wheezing and stridor.   Cardiovascular: Negative for chest pain, palpitations, orthopnea and PND.  Gastrointestinal: Negative for abdominal pain, diarrhea, nausea and vomiting.  Genitourinary: Negative for frequency and urgency.  Musculoskeletal: Negative for back pain and joint pain.  Neurological: Positive for weakness. Negative for sensory change, speech change and focal weakness.  Psychiatric/Behavioral: Negative for depression and hallucinations. The patient is not nervous/anxious.    Tolerating Diet:Yes Tolerating PT: recommends PT  DRUG ALLERGIES:  No Known Allergies  VITALS:  Blood pressure 97/68, pulse 73, temperature 97.6 F (36.4 C), temperature source Oral, resp. rate 18, height 5\' 8"  (1.727 m), weight 74.8 kg (165 lb), SpO2 97 %.  PHYSICAL EXAMINATION:   Physical Exam  GENERAL:  75 y.o.-year-old patient lying in the bed with no acute distress.  EYES: Pupils equal, round, reactive to light and accommodation. No scleral icterus. Extraocular muscles intact.  HEENT: Head atraumatic, normocephalic. Oropharynx and nasopharynx clear.  NECK:  Supple, no jugular venous distention. No thyroid enlargement, no tenderness.  LUNGS: Normal breath sounds bilaterally, no wheezing, rales, rhonchi. No use of accessory muscles of respiration.  CARDIOVASCULAR: S1, S2 normal. No murmurs, rubs, or gallops.  ABDOMEN: Soft, nontender, nondistended. Bowel sounds present. No  organomegaly or mass.  EXTREMITIES: No cyanosis, clubbing or edema b/l.    NEUROLOGIC: Cranial nerves II through XII are intact. No focal Motor or sensory deficits b/l.  Left upper extremity 4 or 5 weakness sensation normal reflexes 1+ both upper and lower extremity PSYCHIATRIC:  patient is alert and oriented x 3.  SKIN: No obvious rash, lesion, or ulcer.   LABORATORY PANEL:  CBC  Recent Labs Lab 03/03/16 0406  WBC 9.3  HGB 13.7  HCT 41.1  PLT 236    Chemistries   Recent Labs Lab 03/03/16 0406  NA 140  K 3.6  CL 110  CO2 25  GLUCOSE 123*  BUN 14  CREATININE 0.73  CALCIUM 8.6*   Cardiac Enzymes  Recent Labs Lab 03/02/16 1732  TROPONINI <0.03   RADIOLOGY:  Ct Head Wo Contrast  Result Date: 03/02/2016 CLINICAL DATA:  Left-sided weakness since 9:30 a.m. EXAM: CT HEAD WITHOUT CONTRAST TECHNIQUE: Contiguous axial images were obtained from the base of the skull through the vertex without intravenous contrast. COMPARISON:  12/28/2015 FINDINGS: Brain: No evidence of acute infarction, hemorrhage, hydrocephalus, extra-axial collection or mass lesion/mass effect. Generalized atrophy. No unexpected white matter ischemic change. Vascular: No hyperdense vessel or unexpected calcification. Skull: Normal. Negative for fracture or focal lesion. Sinuses/Orbits: Right cataract resection IMPRESSION: 1. No acute finding. 2. Moderate generalized atrophy. Electronically Signed   By: Monte Fantasia M.D.   On: 03/02/2016 21:12   Mr Brain Wo Contrast  Result Date: 03/03/2016 CLINICAL DATA:  75 year old male with dizziness and weakness since 0930 hours on 03/02/2016. Initial encounter. EXAM: MRI HEAD WITHOUT CONTRAST MRA HEAD WITHOUT CONTRAST TECHNIQUE: Multiplanar, multiecho pulse sequences of the brain and surrounding structures were obtained without intravenous contrast. Angiographic images of  the head were obtained using MRA technique without contrast. COMPARISON:  Head CT without contrast  03/02/2016. Brain MRI 12/29/2015 FINDINGS: MRI HEAD FINDINGS Brain: No restricted diffusion to suggest acute infarction. No midline shift, mass effect, evidence of mass lesion, ventriculomegaly, extra-axial collection or acute intracranial hemorrhage. Cervicomedullary junction and pituitary are within normal limits. Stable cerebral volume. Mild mass effect on the left medulla is stable and related to distal left vertebral artery tortuosity. Stable scattered small nonspecific cerebral white matter T2 and FLAIR hyperintense foci, extent is mild for age. 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 Negative deep gray matter nuclei and brainstem. No chronic cerebral blood products. Vascular: Major intracranial vascular flow voids are stable and appear normal aside from a mild degree of intracranial artery tortuosity. Skull and upper cervical spine: Stable partially visible upper cervical spine degeneration. Normal bone marrow signal. Sinuses/Orbits: Visualized paranasal sinuses and mastoids are stable and well pneumatized. Stable orbit and scalp soft tissues. Other: Visible internal auditory structures appear normal. Mastoids are clear. MRA HEAD FINDINGS Mildly tortuous distal vertebral arteries with no stenosis. Both PICA origins appear normal. Patent vertebrobasilar junction. No basilar stenosis. Right AICA, bilateral SCA, and PCA origins are normal. Normal PCA branches. Posterior communicating arteries are diminutive or absent. Antegrade flow in both ICA siphons. Mild bilateral siphon irregularity in keeping with atherosclerosis but no stenosis. Normal ophthalmic artery origins. Normal carotid termini, MCA and ACA origins. Mildly fenestrated anterior communicating artery (normal variant). Negative visualized ACA branches. Minimal bilateral MCA M1 segment irregularity without stenosis. Negative visualized bilateral MCA branches. IMPRESSION: 1.  No acute intracranial abnormality. 2.  Stable noncontrast MRI appearance the brain since August. Mild for age chronic small vessel ischemia, primarily in the right cerebellum. 3. Negative for age intracranial MRA; mild intracranial atherosclerosis without stenosis. Electronically Signed   By: Genevie Ann M.D.   On: 03/03/2016 10:14   Mr Cervical Spine Wo Contrast  Result Date: 03/03/2016 CLINICAL DATA:  Left-sided weakness since yesterday. History of Parkinson's disease and diabetes. EXAM: MRI CERVICAL SPINE WITHOUT CONTRAST TECHNIQUE: Multiplanar, multisequence MR imaging of the cervical spine was performed. No intravenous contrast was administered. COMPARISON:  MRI brain same date. FINDINGS: Alignment: Normal. Vertebrae: No acute or suspicious osseous findings. Cord: There is mild cervical cord atrophy with focal myelomalacia just inferior to the C4-5 disc space level. No evidence of cord edema or hemorrhage. Posterior Fossa, vertebral arteries, paraspinal tissues: Mild atrophy within the posterior fossa. No paraspinal abnormalities are seen. Bilateral vertebral artery flow voids. Disc levels: C2-3: Disc height is maintained. There is asymmetric facet hypertrophy on the left without resulting spinal stenosis or nerve root encroachment. C3-4: Loss of disc height with annular disc bulging and endplate osteophytes asymmetric to the left. The CSF surrounding the cord is effaced without cord deformity. There is moderate left-greater-than-right foraminal narrowing. C4-5: There is spondylosis with loss of disc height, endplate osteophytes and a central disc protrusion. There is cord flattening with narrowing of the AP diameter of the canal to 5 mm. Moderate foraminal narrowing is present, left greater than right. C5-6: There is spondylosis with loss of disc height and an asymmetric left paracentral disc osteophyte complex. There is mild cord flattening on the left with narrowing of the AP diameter of the canal to 6 mm. There is moderate  left-greater-than-right foraminal narrowing. C6-7: Chronic spondylosis with loss of disc height and endplate osteophytes asymmetric to the left. No cord deformity. Minimal foraminal narrowing bilaterally. C7-T1:  No  significant findings. IMPRESSION: 1. Moderate size central disc protrusion at C4-5, contributing to cord flattening and moderate foraminal narrowing, left greater than right. 2. Mild left-sided cord flattening at C5-6 with left-greater-than-right foraminal narrowing. 3. Moderate foraminal narrowing is also present at C3-4 secondary to spondylosis. 4. Cord atrophy with small focus of myelomalacia inferior to the C4-5 disc space level per Electronically Signed   By: Richardean Sale M.D.   On: 03/03/2016 13:34   US Carotid Bilateral (at [facility] Only)  Result Date: 03/03/2016 CLINICAL DATA:  TIA. EXAM: BILATERAL CAROTID DUPLEX ULTRASOUND TECHNIQUE: Pearline Cables scale imaging, color Doppler and duplex ultrasound were performed of bilateral carotid and vertebral arteries in the neck. COMPARISON:  None. FINDINGS: Criteria: Quantification of carotid stenosis is based on velocity parameters that correlate the residual internal carotid diameter with NASCET-based stenosis levels, using the diameter of the distal internal carotid lumen as the denominator for stenosis measurement. The following velocity measurements were obtained: RIGHT ICA:  102/35 cm/sec CCA:  123XX123 cm/sec SYSTOLIC ICA/CCA RATIO:  0.9 DIASTOLIC ICA/CCA RATIO:  1.1 ECA:  151 cm/sec LEFT ICA:  99/29 cm/sec CCA:  99991111 cm/sec SYSTOLIC ICA/CCA RATIO:  0.9 DIASTOLIC ICA/CCA RATIO:  0.8 ECA:  153 cm/sec RIGHT CAROTID ARTERY: Bifurcation proximal ICA mild to moderate atherosclerotic vascular disease. No flow limiting stenosis. RIGHT VERTEBRAL ARTERY:  Patent with antegrade flow. LEFT CAROTID ARTERY: Left common carotid, carotid bifurcation, proximal ICA mild to moderate atherosclerotic vascular disease. No flow limiting stenosis. LEFT VERTEBRAL ARTERY:   Patent with antegrade flow. IMPRESSION: 1. 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 No flow limiting stenosis. Degree of stenosis less than 50%. 2. Left common carotid, carotid bifurcation, proximal ICA mild moderate atherosclerotic vascular disease. No flow limiting stenosis. Degree of stenosis less than 50%. 3. Vertebral arteries are patent with antegrade flow bilaterally. Electronically Signed   By: Marcello Moores  Register   On: 03/03/2016 10:55   Mr Jodene Nam Head/brain Wo Cm  Result Date: 03/03/2016 CLINICAL DATA:  75 year old male with dizziness and weakness since 0930 hours on 03/02/2016. Initial encounter. EXAM: MRI HEAD WITHOUT CONTRAST MRA HEAD WITHOUT CONTRAST TECHNIQUE: Multiplanar, multiecho pulse sequences of the brain and surrounding structures were obtained without intravenous contrast. Angiographic images of the head were obtained using MRA technique without contrast. COMPARISON:  Head CT without contrast 03/02/2016. Brain MRI 12/29/2015 FINDINGS: MRI HEAD FINDINGS Brain: No restricted diffusion to suggest acute infarction. No midline shift, mass effect, evidence of mass lesion, ventriculomegaly, extra-axial collection or acute intracranial hemorrhage. Cervicomedullary junction and pituitary are within normal limits. Stable cerebral volume. Mild mass effect on the left medulla is stable and related to distal left vertebral artery tortuosity. Stable scattered small nonspecific cerebral white matter T2 and FLAIR hyperintense foci, extent is mild for age. 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 Negative deep gray matter nuclei and brainstem. No chronic cerebral blood products. Vascular: Major intracranial vascular flow voids are stable and appear normal aside from a mild degree of intracranial artery tortuosity. Skull and upper cervical spine: Stable partially visible upper cervical spine degeneration. Normal bone marrow  signal. Sinuses/Orbits: Visualized paranasal sinuses and mastoids are stable and well pneumatized. Stable orbit and scalp soft tissues. Other: Visible internal auditory structures appear normal. Mastoids are clear. MRA HEAD FINDINGS Mildly tortuous distal vertebral arteries with no stenosis. Both PICA origins appear normal. Patent vertebrobasilar junction. No basilar stenosis. Right AICA, bilateral SCA, and PCA origins are normal. Normal PCA  branches. Posterior communicating arteries are diminutive or absent. Antegrade flow in both ICA siphons. Mild bilateral siphon irregularity in keeping with atherosclerosis but no stenosis. Normal ophthalmic artery origins. Normal carotid termini, MCA and ACA origins. Mildly fenestrated anterior communicating artery (normal variant). Negative visualized ACA branches. Minimal bilateral MCA M1 segment irregularity without stenosis. Negative visualized bilateral MCA branches. IMPRESSION: 1.  No acute intracranial abnormality. 2. Stable noncontrast MRI appearance the brain since August. Mild for age chronic small vessel ischemia, primarily in the right cerebellum. 3. Negative for age intracranial MRA; mild intracranial atherosclerosis without stenosis. Electronically Signed   By: Genevie Ann M.D.   On: 03/03/2016 10:14   ASSESSMENT AND PLAN:  Frey Kue  is a 75 y.o. male with a known history of arthritis, congestive heart failure, chronic kidney disease stage III, COPD, coronary artery disease, diabetes mellitus type 2, GERD, Parkinson's disease, peripheral vascular disease presented to the emergency room with dizziness and weakness since 9:30 AM today morning. Patient felt weak on the left side more than right side. He felt he had weakness in the left arm and left lower extremity no complaints of any slurred speech    1. 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 Occupation therapy recommends extensive upper extremity strengthening exercises -MRI negative for stroke -Appreciated neurological input -PT recommends Rehab but pt refusing. He is worried about his dog. Left msg for dter to call me  2. Parkinson's disease continue carbidopa levodopa and gabapentin -Patient is on quite a high dose of gabapentin will ask pharmacy to see if it needs to be adjusted  3. Type 2 diabetes mellitus - sugar stable continue home meds   4. Stable congestive heart failure  5. Positive occult stools -Patient had colonoscopy done 02/19/2016 at [facility] which showed polyps that were removed about 10.  6. Subjective weight loss according to patient and patient's daughter -Primary care physician aware about weight loss at [facility] -Daughter was Curious about cancer screening. Patient has routine labs within normal limits -Colonoscopy was negative for any mass -GI recommends EGD. Pt agreeable. Will get it done today -Chest x-ray  in the past does not show any lung mass -Patient is tolerating Ensure well. -Discussed with daughter to follow-up this with primary care physician at [facility].  Ivy and case manager for discharge planning  e discussed with Care Management/Social Worker. Management plans discussed with the patient, family and they are in agreement.  CODE STATUS:full  DVT Prophylaxis: lovenox  TOTAL TIME TAKING CARE OF THIS PATIENT: 47minutes.  >50% time spent on counselling and coordination of care pt and dter (over the phone)  POSSIBLE D/C IN 1-2 DAYS, DEPENDING ON CLINICAL CONDITION.  Note: This dictation was prepared with Dragon dictation along with smaller phrase technology. Any transcriptional errors that result from this process are unintentional.  Tenelle Andreason M.D on 03/04/2016 at 8:28 AM  Between 7am to 6pm - Pager - [phone]  After 6pm go to [email or web address] - password [email or web address]   Office  [phone]  CC: Primary care physician; Gareth Morgan, MD

## 2016-03-04 NOTE — Discharge Instructions (Signed)
[facility] to arrange for PT and OT

## 2016-03-04 NOTE — Progress Notes (Signed)
Subjective: Patient awake and alert.  Continues with left sided weakness.    Objective: Current vital signs: BP (!) 109/59 (BP Location: Right Arm)   Pulse 76   Temp 97.7 F (36.5 C) (Oral)   Resp 16   Ht 5\' 8"  (1.727 m)   Wt 74.8 kg (165 lb)   SpO2 96%   BMI 25.09 kg/m  Vital signs in last 24 hours: Temp:  [97.3 F (36.3 C)-98.3 F (36.8 C)] 97.7 F (36.5 C) (10/17 1233) Pulse Rate:  [70-76] 76 (10/17 1142) Resp:  [16-20] 16 (10/17 1233) BP: (84-109)/(42-75) 109/59 (10/17 1233) SpO2:  [96 %-98 %] 96 % (10/17 1142)  Intake/Output from previous day: 10/16 0701 - 10/17 0700 In: 1741.3 [P.O.:170; I.V.:1521.3; IV Piggyback:50] Out: 1050 [Urine:1050] Intake/Output this shift: Total I/O In: 200 [I.V.:200] Out: 325 [Urine:325] Nutritional status: Diet regular Room service appropriate? Yes; Fluid consistency: Thin Diet - low sodium heart healthy  Neurologic Exam: Mental Status: Alert, oriented, thought content appropriate.  Not cooperative.  Speech fluent without evidence of aphasia.  Able to follow 3 step commands without difficulty. Cranial Nerves: II: Discs flat bilaterally; Visual fields grossly normal, pupils equal, round, reactive to light and accommodation III,IV, VI: ptosis not present, extra-ocular motions intact bilaterally V,VII: smile symmetric, facial light touch sensation normal bilaterally VIII: hearing normal bilaterally IX,X: gag reflex present XI: bilateral shoulder shrug XII: midline tongue extension Motor: Right :  Upper extremity   5/5                                      Left:     Upper extremity   4/5             Lower extremity   5/5                                                  Lower extremity   4-/5 Tone and bulk:normal tone throughout; no atrophy noted Sensory: Pinprick and light touch decreased in the LLE  Lab Results: Basic Metabolic Panel:  Recent Labs Lab 03/02/16 1732 03/03/16 0406  NA 136 140  K 3.5 3.6  CL 103 110  CO2 28 25   GLUCOSE 137* 123*  BUN 16 14  CREATININE 1.00 0.73  CALCIUM 8.6* 8.6*    Liver Function Tests: No results for input(s): AST, ALT, ALKPHOS, BILITOT, PROT, ALBUMIN in the last 168 hours. No results for input(s): LIPASE, AMYLASE in the last 168 hours. No results for input(s): AMMONIA in the last 168 hours.  CBC:  Recent Labs Lab 03/02/16 1732 03/03/16 0406  WBC 9.6 9.3  HGB 13.5 13.7  HCT 39.2* 41.1  MCV 89.0 90.5  PLT 223 236    Cardiac Enzymes:  Recent Labs Lab 03/02/16 1732  TROPONINI <0.03    Lipid Panel:  Recent Labs Lab 03/03/16 0406  CHOL 93  TRIG 227*  HDL 24*  CHOLHDL 3.9  VLDL 45*  LDLCALC 24    CBG:  Recent Labs Lab 03/03/16 0904 03/03/16 1142 03/03/16 1728 03/03/16 2200 03/04/16 0741  GLUCAP 141* 114* 158* 161* 85*    Microbiology: Results for orders placed or performed during the hospital encounter of 12/28/15  MRSA PCR Screening     Status: None  Collection Time: 12/28/15  6:27 PM  Result Value Ref Range Status   MRSA by PCR NEGATIVE NEGATIVE Final    Comment:        The GeneXpert MRSA Assay (FDA approved for NASAL specimens only), is one component of a comprehensive MRSA colonization surveillance program. It is not intended to diagnose MRSA infection nor to guide or monitor treatment for MRSA infections.     Coagulation Studies: No results for input(s): LABPROT, INR in the last 72 hours.  Imaging: Ct Head Wo Contrast  Result Date: 03/02/2016 CLINICAL DATA:  Left-sided weakness since 9:30 a.m. EXAM: CT HEAD WITHOUT CONTRAST TECHNIQUE: Contiguous axial images were obtained from the base of the skull through the vertex without intravenous contrast. COMPARISON:  12/28/2015 FINDINGS: Brain: No evidence of acute infarction, hemorrhage, hydrocephalus, extra-axial collection or mass lesion/mass effect. Generalized atrophy. No unexpected white matter ischemic change. Vascular: No hyperdense vessel or unexpected calcification.  Skull: Normal. Negative for fracture or focal lesion. Sinuses/Orbits: Right cataract resection IMPRESSION: 1. No acute finding. 2. Moderate generalized atrophy. Electronically Signed   By: Monte Fantasia M.D.   On: 03/02/2016 21:12   Mr Brain Wo Contrast  Result Date: 03/03/2016 CLINICAL DATA:  75 year old male with dizziness and weakness since 0930 hours on 03/02/2016. Initial encounter. EXAM: MRI HEAD WITHOUT CONTRAST MRA HEAD WITHOUT CONTRAST TECHNIQUE: Multiplanar, multiecho pulse sequences of the brain and surrounding structures were obtained without intravenous contrast. Angiographic images of the head were obtained using MRA technique without contrast. COMPARISON:  Head CT without contrast 03/02/2016. Brain MRI 12/29/2015 FINDINGS: MRI HEAD FINDINGS Brain: No restricted diffusion to suggest acute infarction. No midline shift, mass effect, evidence of mass lesion, ventriculomegaly, extra-axial collection or acute intracranial hemorrhage. Cervicomedullary junction and pituitary are within normal limits. Stable cerebral volume. Mild mass effect on the left medulla is stable and related to distal left vertebral artery tortuosity. Stable scattered small nonspecific cerebral white matter T2 and FLAIR hyperintense foci, extent is mild for age. 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 Negative deep gray matter nuclei and brainstem. No chronic cerebral blood products. Vascular: Major intracranial vascular flow voids are stable and appear normal aside from a mild degree of intracranial artery tortuosity. Skull and upper cervical spine: Stable partially visible upper cervical spine degeneration. Normal bone marrow signal. Sinuses/Orbits: Visualized paranasal sinuses and mastoids are stable and well pneumatized. Stable orbit and scalp soft tissues. Other: Visible internal auditory structures appear normal. Mastoids are clear. MRA HEAD FINDINGS Mildly tortuous distal vertebral  arteries with no stenosis. Both PICA origins appear normal. Patent vertebrobasilar junction. No basilar stenosis. Right AICA, bilateral SCA, and PCA origins are normal. Normal PCA branches. Posterior communicating arteries are diminutive or absent. Antegrade flow in both ICA siphons. Mild bilateral siphon irregularity in keeping with atherosclerosis but no stenosis. Normal ophthalmic artery origins. Normal carotid termini, MCA and ACA origins. Mildly fenestrated anterior communicating artery (normal variant). Negative visualized ACA branches. Minimal bilateral MCA M1 segment irregularity without stenosis. Negative visualized bilateral MCA branches. IMPRESSION: 1.  No acute intracranial abnormality. 2. Stable noncontrast MRI appearance the brain since August. Mild for age chronic small vessel ischemia, primarily in the right cerebellum. 3. Negative for age intracranial MRA; mild intracranial atherosclerosis without stenosis. Electronically Signed   By: Genevie Ann M.D.   On: 03/03/2016 10:14   Mr Cervical Spine Wo Contrast  Result Date: 03/03/2016 CLINICAL DATA:  Left-sided weakness since yesterday. History of Parkinson's disease and diabetes. EXAM: MRI  CERVICAL SPINE WITHOUT CONTRAST TECHNIQUE: Multiplanar, multisequence MR imaging of the cervical spine was performed. No intravenous contrast was administered. COMPARISON:  MRI brain same date. FINDINGS: Alignment: Normal. Vertebrae: No acute or suspicious osseous findings. Cord: There is mild cervical cord atrophy with focal myelomalacia just inferior to the C4-5 disc space level. No evidence of cord edema or hemorrhage. Posterior Fossa, vertebral arteries, paraspinal tissues: Mild atrophy within the posterior fossa. No paraspinal abnormalities are seen. Bilateral vertebral artery flow voids. Disc levels: C2-3: Disc height is maintained. There is asymmetric facet hypertrophy on the left without resulting spinal stenosis or nerve root encroachment. C3-4: Loss of disc  height with annular disc bulging and endplate osteophytes asymmetric to the left. The CSF surrounding the cord is effaced without cord deformity. There is moderate left-greater-than-right foraminal narrowing. C4-5: There is spondylosis with loss of disc height, endplate osteophytes and a central disc protrusion. There is cord flattening with narrowing of the AP diameter of the canal to 5 mm. Moderate foraminal narrowing is present, left greater than right. C5-6: There is spondylosis with loss of disc height and an asymmetric left paracentral disc osteophyte complex. There is mild cord flattening on the left with narrowing of the AP diameter of the canal to 6 mm. There is moderate left-greater-than-right foraminal narrowing. C6-7: Chronic spondylosis with loss of disc height and endplate osteophytes asymmetric to the left. No cord deformity. Minimal foraminal narrowing bilaterally. C7-T1:  No significant findings. IMPRESSION: 1. Moderate size central disc protrusion at C4-5, contributing to cord flattening and moderate foraminal narrowing, left greater than right. 2. Mild left-sided cord flattening at C5-6 with left-greater-than-right foraminal narrowing. 3. Moderate foraminal narrowing is also present at C3-4 secondary to spondylosis. 4. Cord atrophy with small focus of myelomalacia inferior to the C4-5 disc space level per Electronically Signed   By: Richardean Sale M.D.   On: 03/03/2016 13:34   US Carotid Bilateral (at [facility] Only)  Result Date: 03/03/2016 CLINICAL DATA:  TIA. EXAM: BILATERAL CAROTID DUPLEX ULTRASOUND TECHNIQUE: Pearline Cables scale imaging, color Doppler and duplex ultrasound were performed of bilateral carotid and vertebral arteries in the neck. COMPARISON:  None. FINDINGS: Criteria: Quantification of carotid stenosis is based on velocity parameters that correlate the residual internal carotid diameter with NASCET-based stenosis levels, using the diameter of the distal internal carotid lumen as  the denominator for stenosis measurement. The following velocity measurements were obtained: RIGHT ICA:  102/35 cm/sec CCA:  123XX123 cm/sec SYSTOLIC ICA/CCA RATIO:  0.9 DIASTOLIC ICA/CCA RATIO:  1.1 ECA:  151 cm/sec LEFT ICA:  99/29 cm/sec CCA:  99991111 cm/sec SYSTOLIC ICA/CCA RATIO:  0.9 DIASTOLIC ICA/CCA RATIO:  0.8 ECA:  153 cm/sec RIGHT CAROTID ARTERY: Bifurcation proximal ICA mild to moderate atherosclerotic vascular disease. No flow limiting stenosis. RIGHT VERTEBRAL ARTERY:  Patent with antegrade flow. LEFT CAROTID ARTERY: Left common carotid, carotid bifurcation, proximal ICA mild to moderate atherosclerotic vascular disease. No flow limiting stenosis. LEFT VERTEBRAL ARTERY:  Patent with antegrade flow. IMPRESSION: 1. 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 No flow limiting stenosis. Degree of stenosis less than 50%. 2. Left common carotid, carotid bifurcation, proximal ICA mild moderate atherosclerotic vascular disease. No flow limiting stenosis. Degree of stenosis less than 50%. 3. Vertebral arteries are patent with antegrade flow bilaterally. Electronically Signed   By: Marcello Moores  Register   On: 03/03/2016 10:55   Mr Jodene Nam Head/brain Wo Cm  Result Date: 03/03/2016 CLINICAL DATA:  76 year old male with dizziness and  weakness since 0930 hours on 03/02/2016. Initial encounter. EXAM: MRI HEAD WITHOUT CONTRAST MRA HEAD WITHOUT CONTRAST TECHNIQUE: Multiplanar, multiecho pulse sequences of the brain and surrounding structures were obtained without intravenous contrast. Angiographic images of the head were obtained using MRA technique without contrast. COMPARISON:  Head CT without contrast 03/02/2016. Brain MRI 12/29/2015 FINDINGS: MRI HEAD FINDINGS Brain: No restricted diffusion to suggest acute infarction. No midline shift, mass effect, evidence of mass lesion, ventriculomegaly, extra-axial collection or acute intracranial hemorrhage. Cervicomedullary junction  and pituitary are within normal limits. Stable cerebral volume. Mild mass effect on the left medulla is stable and related to distal left vertebral artery tortuosity. Stable scattered small nonspecific cerebral white matter T2 and FLAIR hyperintense foci, extent is mild for age. 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 Negative deep gray matter nuclei and brainstem. No chronic cerebral blood products. Vascular: Major intracranial vascular flow voids are stable and appear normal aside from a mild degree of intracranial artery tortuosity. Skull and upper cervical spine: Stable partially visible upper cervical spine degeneration. Normal bone marrow signal. Sinuses/Orbits: Visualized paranasal sinuses and mastoids are stable and well pneumatized. Stable orbit and scalp soft tissues. Other: Visible internal auditory structures appear normal. Mastoids are clear. MRA HEAD FINDINGS Mildly tortuous distal vertebral arteries with no stenosis. Both PICA origins appear normal. Patent vertebrobasilar junction. No basilar stenosis. Right AICA, bilateral SCA, and PCA origins are normal. Normal PCA branches. Posterior communicating arteries are diminutive or absent. Antegrade flow in both ICA siphons. Mild bilateral siphon irregularity in keeping with atherosclerosis but no stenosis. Normal ophthalmic artery origins. Normal carotid termini, MCA and ACA origins. Mildly fenestrated anterior communicating artery (normal variant). Negative visualized ACA branches. Minimal bilateral MCA M1 segment irregularity without stenosis. Negative visualized bilateral MCA branches. IMPRESSION: 1.  No acute intracranial abnormality. 2. Stable noncontrast MRI appearance the brain since August. Mild for age chronic small vessel ischemia, primarily in the right cerebellum. 3. Negative for age intracranial MRA; mild intracranial atherosclerosis without stenosis. Electronically Signed   By: Genevie Ann M.D.   On:  03/03/2016 10:14    Medications:  I have reviewed the patient's current medications. Scheduled: . aspirin EC  81 mg Oral Once  . atorvastatin  80 mg Oral QHS  . timolol  1 drop Both Eyes BID   And  . brimonidine  1 drop Both Eyes BID  . calcium-vitamin D  1 tablet Oral Daily  . carbidopa-levodopa  1 tablet Oral QID  . famotidine  20 mg Oral Daily  . gabapentin  300 mg Oral BID   And  . gabapentin  600 mg Oral QHS  . guaiFENesin  600 mg Oral Daily  . insulin aspart  0-15 Units Subcutaneous TID WC  . insulin aspart  0-5 Units Subcutaneous QHS  . loratadine  10 mg Oral Daily  . mometasone-formoterol  2 puff Inhalation BID  . pneumococcal 23 valent vaccine  0.5 mL Intramuscular Tomorrow-1000  . QUEtiapine  25 mg Oral QHS  . roflumilast  500 mcg Oral Daily  . senna-docusate  1 tablet Oral BID  . tiotropium  18 mcg Inhalation Daily  . vitamin B-12  1,000 mcg Oral Daily  . [START ON 03/07/2016] Vitamin D (Ergocalciferol)  50,000 Units Oral Q30 days    Assessment/Plan: Patient with continued left sided weakness.  MRI of the cervical spine reviewed and shows a left central disc protrusion and neuroforaminal narrowing at C4-5, C5-6 with cord flattening.  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  Unclear contribution to left sided weakness.   A1c 5.9, LDL 24.   Recommendations: 1.  Neurosurgery to evaluate patient 2.  Continue ASA and Plavix if OK per GI    LOS: 2 days   Alexis Goodell, MD Neurology [phone] 03/04/2016  1:24 PM

## 2016-03-04 NOTE — Progress Notes (Signed)
Pt ready to discharge home, however he doesn't have a key to his home to get in when EMS takes him home.  Theressa Stamps, RN with [facility] and she is checking to see if pt's case manager has a key.  Clarise Cruz, RN

## 2016-03-05 LAB — SURGICAL PATHOLOGY

## 2016-03-06 ENCOUNTER — Encounter: Payer: Self-pay | Admitting: Gastroenterology

## 2016-03-08 ENCOUNTER — Encounter: Payer: Self-pay | Admitting: Gastroenterology

## 2016-03-19 ENCOUNTER — Encounter: Payer: Self-pay | Admitting: Gastroenterology

## 2016-03-19 NOTE — Transfer of Care (Signed)
Immediate Anesthesia Transfer of Care Note  Patient: Jimmy Hill  Procedure(s) Performed: Procedure(s): ESOPHAGOGASTRODUODENOSCOPY (EGD) WITH PROPOFOL (N/A)  Patient Location: PACU  Anesthesia Type:General  Level of Consciousness: awake and alert   Airway & Oxygen Therapy: Patient Spontanous Breathing and Patient connected to nasal cannula oxygen  Post-op Assessment: Report given to RN and Post -op Vital signs reviewed and stable  Post vital signs: Reviewed  Last Vitals:  Vitals:   03/04/16 1233 03/04/16 1622  BP: (!) 109/59 111/60  Pulse:  77  Resp: 16   Temp: 36.5 C 36.4 C    Last Pain:  Vitals:   03/04/16 1622  TempSrc: Oral         Complications: No apparent anesthesia complications

## 2016-03-19 NOTE — Anesthesia Postprocedure Evaluation (Signed)
Anesthesia Post Note  Patient: Jimmy Hill  Procedure(s) Performed: Procedure(s) (LRB): ESOPHAGOGASTRODUODENOSCOPY (EGD) WITH PROPOFOL (N/A)  Patient location during evaluation: PACU Anesthesia Type: General Level of consciousness: awake Pain management: pain level controlled Vital Signs Assessment: post-procedure vital signs reviewed and stable Respiratory status: nonlabored ventilation Cardiovascular status: stable Anesthetic complications: no    Last Vitals:  Vitals:   03/04/16 1233 03/04/16 1622  BP: (!) 109/59 111/60  Pulse:  77  Resp: 16   Temp: 36.5 C 36.4 C    Last Pain:  Vitals:   03/04/16 1622  TempSrc: Oral                 VAN STAVEREN,Journi Moffa

## 2016-07-03 ENCOUNTER — Observation Stay
Admission: EM | Admit: 2016-07-03 | Discharge: 2016-07-04 | Disposition: A | Discharge status: Home / Self Care | Payer: Medicare (Managed Care) | Attending: Internal Medicine | Admitting: Internal Medicine

## 2016-07-03 ENCOUNTER — Encounter: Payer: Self-pay | Admitting: Emergency Medicine

## 2016-07-03 ENCOUNTER — Emergency Department: Payer: Medicare (Managed Care)

## 2016-07-03 DIAGNOSIS — G4733 Obstructive sleep apnea (adult) (pediatric): Secondary | ICD-10-CM | POA: Diagnosis not present

## 2016-07-03 DIAGNOSIS — I272 Pulmonary hypertension, unspecified: Secondary | ICD-10-CM | POA: Diagnosis not present

## 2016-07-03 DIAGNOSIS — M6281 Muscle weakness (generalized): Secondary | ICD-10-CM

## 2016-07-03 DIAGNOSIS — I951 Orthostatic hypotension: Secondary | ICD-10-CM | POA: Diagnosis not present

## 2016-07-03 DIAGNOSIS — G2581 Restless legs syndrome: Secondary | ICD-10-CM | POA: Insufficient documentation

## 2016-07-03 DIAGNOSIS — K2970 Gastritis, unspecified, without bleeding: Secondary | ICD-10-CM | POA: Insufficient documentation

## 2016-07-03 DIAGNOSIS — F17210 Nicotine dependence, cigarettes, uncomplicated: Secondary | ICD-10-CM | POA: Diagnosis not present

## 2016-07-03 DIAGNOSIS — I4891 Unspecified atrial fibrillation: Principal | ICD-10-CM | POA: Insufficient documentation

## 2016-07-03 DIAGNOSIS — J309 Allergic rhinitis, unspecified: Secondary | ICD-10-CM | POA: Insufficient documentation

## 2016-07-03 DIAGNOSIS — M1990 Unspecified osteoarthritis, unspecified site: Secondary | ICD-10-CM | POA: Insufficient documentation

## 2016-07-03 DIAGNOSIS — Z794 Long term (current) use of insulin: Secondary | ICD-10-CM | POA: Insufficient documentation

## 2016-07-03 DIAGNOSIS — R079 Chest pain, unspecified: Secondary | ICD-10-CM

## 2016-07-03 DIAGNOSIS — H409 Unspecified glaucoma: Secondary | ICD-10-CM | POA: Diagnosis not present

## 2016-07-03 DIAGNOSIS — Z79899 Other long term (current) drug therapy: Secondary | ICD-10-CM | POA: Insufficient documentation

## 2016-07-03 DIAGNOSIS — I509 Heart failure, unspecified: Secondary | ICD-10-CM | POA: Diagnosis not present

## 2016-07-03 DIAGNOSIS — N183 Chronic kidney disease, stage 3 (moderate): Secondary | ICD-10-CM | POA: Diagnosis not present

## 2016-07-03 DIAGNOSIS — K219 Gastro-esophageal reflux disease without esophagitis: Secondary | ICD-10-CM | POA: Diagnosis not present

## 2016-07-03 DIAGNOSIS — H509 Unspecified strabismus: Secondary | ICD-10-CM | POA: Insufficient documentation

## 2016-07-03 DIAGNOSIS — E876 Hypokalemia: Secondary | ICD-10-CM | POA: Insufficient documentation

## 2016-07-03 DIAGNOSIS — E1142 Type 2 diabetes mellitus with diabetic polyneuropathy: Secondary | ICD-10-CM | POA: Diagnosis not present

## 2016-07-03 DIAGNOSIS — E785 Hyperlipidemia, unspecified: Secondary | ICD-10-CM | POA: Insufficient documentation

## 2016-07-03 DIAGNOSIS — E1122 Type 2 diabetes mellitus with diabetic chronic kidney disease: Secondary | ICD-10-CM | POA: Diagnosis not present

## 2016-07-03 DIAGNOSIS — I252 Old myocardial infarction: Secondary | ICD-10-CM | POA: Diagnosis not present

## 2016-07-03 DIAGNOSIS — K922 Gastrointestinal hemorrhage, unspecified: Secondary | ICD-10-CM | POA: Insufficient documentation

## 2016-07-03 DIAGNOSIS — I493 Ventricular premature depolarization: Secondary | ICD-10-CM | POA: Diagnosis not present

## 2016-07-03 DIAGNOSIS — J449 Chronic obstructive pulmonary disease, unspecified: Secondary | ICD-10-CM | POA: Insufficient documentation

## 2016-07-03 DIAGNOSIS — Z955 Presence of coronary angioplasty implant and graft: Secondary | ICD-10-CM | POA: Insufficient documentation

## 2016-07-03 DIAGNOSIS — G20 Parkinson's disease: Secondary | ICD-10-CM | POA: Diagnosis not present

## 2016-07-03 DIAGNOSIS — G904 Autonomic dysreflexia: Secondary | ICD-10-CM | POA: Diagnosis not present

## 2016-07-03 DIAGNOSIS — Z7902 Long term (current) use of antithrombotics/antiplatelets: Secondary | ICD-10-CM | POA: Insufficient documentation

## 2016-07-03 DIAGNOSIS — Z7982 Long term (current) use of aspirin: Secondary | ICD-10-CM | POA: Insufficient documentation

## 2016-07-03 DIAGNOSIS — F329 Major depressive disorder, single episode, unspecified: Secondary | ICD-10-CM | POA: Insufficient documentation

## 2016-07-03 DIAGNOSIS — M545 Low back pain: Secondary | ICD-10-CM | POA: Diagnosis not present

## 2016-07-03 DIAGNOSIS — I25119 Atherosclerotic heart disease of native coronary artery with unspecified angina pectoris: Secondary | ICD-10-CM | POA: Insufficient documentation

## 2016-07-03 DIAGNOSIS — G8929 Other chronic pain: Secondary | ICD-10-CM | POA: Diagnosis not present

## 2016-07-03 DIAGNOSIS — H9319 Tinnitus, unspecified ear: Secondary | ICD-10-CM | POA: Insufficient documentation

## 2016-07-03 DIAGNOSIS — R0789 Other chest pain: Secondary | ICD-10-CM

## 2016-07-03 LAB — CBC WITH DIFFERENTIAL/PLATELET
BASOS PCT: 1 %
Basophils Absolute: 0.1 10*3/uL (ref 0–0.1)
Eosinophils Absolute: 0.3 10*3/uL (ref 0–0.7)
Eosinophils Relative: 3 %
HCT: 46.3 % (ref 40.0–52.0)
HEMOGLOBIN: 15.8 g/dL (ref 13.0–18.0)
Lymphocytes Relative: 42 %
Lymphs Abs: 4.6 10*3/uL — ABNORMAL HIGH (ref 1.0–3.6)
MCH: 31 pg (ref 26.0–34.0)
MCHC: 34.1 g/dL (ref 32.0–36.0)
MCV: 91 fL (ref 80.0–100.0)
Monocytes Absolute: 0.9 10*3/uL (ref 0.2–1.0)
Monocytes Relative: 8 %
NEUTROS PCT: 46 %
Neutro Abs: 5.1 10*3/uL (ref 1.4–6.5)
Platelets: 193 10*3/uL (ref 150–440)
RBC: 5.09 MIL/uL (ref 4.40–5.90)
RDW: 13.7 % (ref 11.5–14.5)
WBC: 11 10*3/uL — AB (ref 3.8–10.6)

## 2016-07-03 LAB — COMPREHENSIVE METABOLIC PANEL
ALK PHOS: 65 U/L (ref 38–126)
ALT: 21 U/L (ref 17–63)
ANION GAP: 11 (ref 5–15)
AST: 30 U/L (ref 15–41)
Albumin: 3.5 g/dL (ref 3.5–5.0)
BILIRUBIN TOTAL: 0.9 mg/dL (ref 0.3–1.2)
BUN: 16 mg/dL (ref 6–20)
CALCIUM: 8.7 mg/dL — AB (ref 8.9–10.3)
CO2: 26 mmol/L (ref 22–32)
Chloride: 103 mmol/L (ref 101–111)
Creatinine, Ser: 1.06 mg/dL (ref 0.61–1.24)
GFR calc non Af Amer: >60 mL/min (ref >60)
Glucose, Bld: 207 mg/dL — ABNORMAL HIGH (ref 65–99)
Potassium: 3.4 mmol/L — ABNORMAL LOW (ref 3.5–5.1)
Sodium: 140 mmol/L (ref 135–145)
TOTAL PROTEIN: 6.6 g/dL (ref 6.5–8.1)

## 2016-07-03 LAB — TROPONIN I

## 2016-07-03 LAB — TSH: TSH: 1.165 u[IU]/mL (ref 0.350–4.500)

## 2016-07-03 MED ORDER — DILTIAZEM HCL 100 MG IV SOLR
5.0000 mg/h | INTRAVENOUS | Status: DC
  Administered 2016-07-03 (×2): 5 mg/h via INTRAVENOUS
  Filled 2016-07-03 (×2): qty 100

## 2016-07-03 MED ORDER — DILTIAZEM LOAD VIA INFUSION
10.0000 mg | Freq: Once | INTRAVENOUS | Status: AC
  Administered 2016-07-03: 10 mg via INTRAVENOUS
  Filled 2016-07-03: qty 10

## 2016-07-03 NOTE — ED Provider Notes (Signed)
Time Seen: Approximately 1915*  I have reviewed the triage notes  Chief Complaint: Chest Pain   History of Present Illness: Jimmy Hill is a 76 y.o. male who states he started having chest pain to this historian approximately 2 hours prior to arrival. He denies any shortness of breath. He states he does have a history of coronary artery disease and has multiple stents. He is not aware of any history of atrial fibrillation as he arrives per EMS with heart rates up to 149. He denies any lightheadedness, fever, etc. Patient was given one nitroglycerin and aspirin prior to arrival   Past Medical History:  Diagnosis Date  . Allergic rhinitis   . Anginal pain ([place])   . Arthritis   . Autonomic dysreflexia   . Back pain    CHRONIC LBP,STENOSIS  . CHF (congestive heart failure) ([place])   . Chronic kidney disease    STAGE 3  . COPD (chronic obstructive pulmonary disease) ([place])   . Coronary artery disease   . Depression   . Diabetes ([place])   . Diabetes mellitus without complication ([place])   . DJD (degenerative joint disease)   . Dysphagia   . Edema    FEET/ANKLES  . GERD (gastroesophageal reflux disease)   . Glaucoma   . H/O orthostatic hypotension   . Myocardial infarction   . Parkinson's disease ([place])   . Peripheral vascular disease ([place])   . Polyneuropathy ([place])   . RLS (restless legs syndrome)   . Sleep apnea   . Smoker   . Strabismic amblyopia   . Tinnitus     Patient Active Problem List   Diagnosis Date Noted  . Loss of weight   . Gastritis without bleeding   . Occult GI bleeding   . GI bleed 03/02/2016  . Left-sided weakness 03/02/2016  . Pressure ulcer 12/29/2015  . Altered mental status 12/28/2015  . Fracture of femoral neck, left ([place]) 12/03/2015  . CAD (coronary artery disease) 12/03/2015  . Sleep apnea 12/03/2015  . Femoral neck fracture, left, closed, initial encounter 12/03/2015  . COPD (chronic obstructive pulmonary disease) ([place]) 02/05/2015  . Acute  bronchitis 02/05/2015  . Type 2 diabetes mellitus ([place]) 02/05/2015  . Chest pain 12/09/2014    Past Surgical History:  Procedure Laterality Date  . ANKLE SURGERY    . APPENDECTOMY    . BACK SURGERY    . CORONARY ANGIOPLASTY     STENT  . CTR    . ESOPHAGOGASTRODUODENOSCOPY (EGD) WITH PROPOFOL N/A 03/04/2016   Procedure: ESOPHAGOGASTRODUODENOSCOPY (EGD) WITH PROPOFOL;  Surgeon: [name], MD;  Location: [facility] ENDOSCOPY;  Service: Endoscopy;  Laterality: N/A;  . EYE SURGERY    . HIP PINNING,CANNULATED Left 12/04/2015   Procedure: CANNULATED HIP PINNING;  Surgeon: Thornton Park, MD;  Location: [facility] ORS;  Service: Orthopedics;  Laterality: Left;    Past Surgical History:  Procedure Laterality Date  . ANKLE SURGERY    . APPENDECTOMY    . BACK SURGERY    . CORONARY ANGIOPLASTY     STENT  . CTR    . ESOPHAGOGASTRODUODENOSCOPY (EGD) WITH PROPOFOL N/A 03/04/2016   Procedure: ESOPHAGOGASTRODUODENOSCOPY (EGD) WITH PROPOFOL;  Surgeon: [name], MD;  Location: [facility] ENDOSCOPY;  Service: Endoscopy;  Laterality: N/A;  . EYE SURGERY    . HIP PINNING,CANNULATED Left 12/04/2015   Procedure: CANNULATED HIP PINNING;  Surgeon: Thornton Park, MD;  Location: [facility] ORS;  Service: Orthopedics;  Laterality: Left;    Current Outpatient Rx  .  Order #: [id number] Class: Historical Med  . Order #: [id number] Class: Historical Med  . Order #: [id number] Class: Historical Med  . Order #: [id number] Class: Historical Med  . Order #: [id number] Class: Historical Med  . Order #: [id number] Class: Historical Med  . Order #: [id number] Class: Historical Med  . Order #: [id number] Class: Historical Med  . Order #: [id number] Class: Historical Med  . Order #: [id number] Class: Historical Med  . Order #: [id number] Class: Historical Med  . Order #: [id number] Class: Historical Med  . Order #: [id number] Class: Historical Med  . Order #: [id number] Class: Historical Med  . Order #: [id number] Class: Historical Med  . Order #:  [id number] Class: Historical Med  . Order #: [id number] Class: Historical Med  . Order #: [id number] Class: Historical Med  . Order #: [id number] Class: Normal  . Order #: [id number] Class: Historical Med  . Order #: [id number] Class: Historical Med  . Order #: [id number] Class: Historical Med  . Order #: [id number] Class: Historical Med  . Order #: [id number] Class: Historical Med  . Order #: [id number] Class: Historical Med  . Order #: [id number] Class: Historical Med  . Order #: [id number] Class: Historical Med  . Order #: [id number] Class: Historical Med  . Order #: [id number] Class: Historical Med    Allergies:  Patient has no known allergies.  Family History: Family History  Problem Relation Age of Onset  . Heart disease Father     Social History: Social History  Substance Use Topics  . Smoking status: Current Some Day Smoker    Packs/day: 0.50    Types: Cigarettes  . Smokeless tobacco: Never Used  . Alcohol use No     Review of Systems:   10 point review of systems was performed and was otherwise negative:  Constitutional: No fever Eyes: No visual disturbances ENT: No sore throat, ear pain Cardiac: Chest pain is left-sided with mild radiation to the left arm  Respiratory: No shortness of breath, wheezing, or stridor Abdomen: No abdominal pain, no vomiting, No diarrhea Endocrine: No weight loss, No night sweats Extremities: No peripheral edema, cyanosis Skin: No rashes, easy bruising Neurologic: No focal weakness, trouble with speech or swollowing Urologic: No dysuria, Hematuria, or urinary frequency   Physical Exam:  ED Triage Vitals  Enc Vitals Group     BP 07/03/16 1911 103/73     Pulse Rate 07/03/16 1911 (!) 141     Resp 07/03/16 1911 (!) 27     Temp --      Temp src --      SpO2 07/03/16 1911 95 %     Weight 07/03/16 1902 175 lb (79.4 kg)     Height 07/03/16 1902 5\' 8"  (1.727 m)     Head Circumference --      Peak Flow --      Pain Score 07/03/16 1903 6     Pain Loc  --      Pain Edu? --      Excl. in [place]? --     General: Awake , Alert , and Oriented times 3; GCS 15 Head: Normal cephalic , atraumatic Eyes: Pupils equal , round, reactive to light Nose/Throat: No nasal drainage, patent upper airway without erythema or exudate.  Neck: Supple, Full range of motion, No anterior adenopathy or palpable thyroid masses Lungs: Clear to ascultation without wheezes , rhonchi, or rales Heart: Irregular rate, irregular rhythm without murmurs , gallops , or rubs Abdomen: Soft, non tender without rebound, guarding , or rigidity; bowel sounds positive and symmetric in all 4 quadrants. No organomegaly .  Extremities: 2 plus symmetric pulses. No edema, clubbing or cyanosis Neurologic: normal ambulation, Motor symmetric without deficits, sensory intact Skin: warm, dry, no rashes   Labs:   All laboratory work was reviewed including any pertinent negatives or positives listed below:  Labs Reviewed  COMPREHENSIVE METABOLIC PANEL  TROPONIN I  CBC WITH DIFFERENTIAL/PLATELET  TSH    EKG: ED ECG REPORT I, Daymon Larsen, the attending physician, personally viewed and interpreted this ECG.  Date: 07/03/2016 EKG Time: 1905 Rate: 137 Rhythm: Atrial fibrillation with occasional PVC  QRS Axis: normal Intervals: normal ST/T Wave abnormalities: normal Conduction Disturbances: none Narrative Interpretation: unremarkable No acute ischemic changes   Radiology: *  "Dg Chest Port 1 View  Result Date: 07/03/2016 CLINICAL DATA:  Chest pain. EXAM: PORTABLE CHEST 1 VIEW COMPARISON:  Radiographs of December 28, 2015. FINDINGS: The heart size and mediastinal contours are within normal limits. Both lungs are clear. No pneumothorax or pleural effusion is noted. The visualized skeletal structures are unremarkable. IMPRESSION: No acute cardiopulmonary abnormality seen. Electronically Signed   By: Marijo Conception, M.D.   On: 07/03/2016 19:54  "  I personally reviewed the  radiologic studies   Critical Care:  CRITICAL CARE Performed by: Daymon Larsen   Total critical care time: 33 minutes  Critical care time was exclusive of separately billable procedures and treating other patients.  Critical care was necessary to treat or prevent imminent or life-threatening deterioration.  Critical care was time spent personally by me on the following activities: development of treatment plan with patient and/or surrogate as well as nursing, discussions with consultants, evaluation of patient's response to treatment, examination of patient, obtaining history from patient or surrogate, ordering and performing treatments and interventions, ordering and review of laboratory studies, ordering and review of radiographic studies, pulse oximetry and re-evaluation of patient's condition. Initiation of IV antiarrhythmic medication    ED Course: * Patient was initiated on a diltiazem drip after a bolus. He presents with new onset atrial fibrillation with chest pain and a history of coronary artery disease. He is currently hemodynamically stable. Initial troponin appears to be within normal limits. He is relatively benign and doesn't appear to have any ischemic changes on his EKG.     Assessment:  Atrial fibrillation Acute chest pain History of coronary artery disease    Plan:  Inpatient management            Daymon Larsen, MD 07/03/16 2109

## 2016-07-03 NOTE — ED Notes (Signed)
Pt demanded iv to be removed from arm. Floor RN aware. 2nd iv started prior to transporting to admission room.

## 2016-07-03 NOTE — ED Triage Notes (Signed)
Per [facility], patient comes from home with c/o CP that started today, 1 hour PTA. Patient took 1 nitro before EMS arrival. EMS gave 324 asa. Patient A&O x4. EMS reports heart rate 149.

## 2016-07-03 NOTE — ED Notes (Signed)
Pt c/o pain at IV site. Dr. Ara Kussmaul aware. Order received to lower dose to 59ml/hr. Pt HR consistently 80-81 for >30 min.

## 2016-07-03 NOTE — H&P (Signed)
History and Physical   [facility] - [place] @ [facility] Admission History and Physical [employer], D.O.    Patient Name: Jimmy Hill MR#: [id number] Date of Birth: 10-06-1940 Date of Admission: 07/03/2016  Referring MD/NP/PA: Dr. Marcelene Butte Primary Care Physician: Gareth Morgan, MD Outpatient Specialists: [facility] Card[facility]  Patient coming from: Home  Chief Complaint: Chest pain  HPI: Jimmy Hill is a 76 y.o. male with a known history of CAD s/p MI with stents, CKD3, COPD, DM, GERD, OSA, PVD was in a usual state of health until about one hour prior to arrival and patient presented to the emergency department complaining of central chest pain, radiating up towards his left neck. He denies any associated shortness of breath, diaphoresis, nausea, palpitations.  Patient states he took 1 nitroglycerin prior to EMS arrival which did not help. He received 325 mg of aspirin in the ambulance.    Otherwise there has been no change in status. Patient has been taking medication as prescribed and there has been no recent change in medication or diet.  No recent antibiotics.  There has been no recent illness, hospitalizations, travel or sick contacts.    Patient denies fevers/chills, weakness, dizziness, shortness of breath, N/V/C/D, abdominal pain, dysuria/frequency, changes in mental status.   ED Course: Patient was found to be in atrial fibrillation with rapid ventricular response for which he received a diltiazem bolus and then was placed on a drip. His symptoms improved as his rate improved.   Review of Systems:  CONSTITUTIONAL: No fever/chills, fatigue, weakness, weight gain/loss, headache. EYES: No blurry or double vision. ENT: No tinnitus, postnasal drip, redness or soreness of the oropharynx. RESPIRATORY: No cough, dyspnea, wheeze.  No hemoptysis.  CARDIOVASCULAR: Positive chest pain, negative palpitations, syncope, orthopnea. No lower extremity edema.  GASTROINTESTINAL: No  nausea, vomiting, abdominal pain, diarrhea, constipation.  No hematemesis, melena or hematochezia. GENITOURINARY: No dysuria, frequency, hematuria. ENDOCRINE: No polyuria or nocturia. No heat or cold intolerance. HEMATOLOGY: No anemia, bruising, bleeding. INTEGUMENTARY: No rashes, ulcers, lesions. MUSCULOSKELETAL: No arthritis, gout, dyspnea. NEUROLOGIC: No numbness, tingling, ataxia, seizure-type activity, weakness. PSYCHIATRIC: No anxiety, depression, insomnia.   Past Medical History:  Diagnosis Date  . Allergic rhinitis   . Anginal pain ([place])   . Arthritis   . Autonomic dysreflexia   . Back pain    CHRONIC LBP,STENOSIS  . CHF (congestive heart failure) ([place])   . Chronic kidney disease    STAGE 3  . COPD (chronic obstructive pulmonary disease) ([place])   . Coronary artery disease   . Depression   . Diabetes ([place])   . Diabetes mellitus without complication ([place])   . DJD (degenerative joint disease)   . Dysphagia   . Edema    FEET/ANKLES  . GERD (gastroesophageal reflux disease)   . Glaucoma   . H/O orthostatic hypotension   . Myocardial infarction   . Parkinson's disease ([place])   . Peripheral vascular disease ([place])   . Polyneuropathy ([place])   . RLS (restless legs syndrome)   . Sleep apnea   . Smoker   . Strabismic amblyopia   . Tinnitus     Past Surgical History:  Procedure Laterality Date  . ANKLE SURGERY    . APPENDECTOMY    . BACK SURGERY    . CORONARY ANGIOPLASTY     STENT  . CTR    . ESOPHAGOGASTRODUODENOSCOPY (EGD) WITH PROPOFOL N/A 03/04/2016   Procedure: ESOPHAGOGASTRODUODENOSCOPY (EGD) WITH PROPOFOL;  Surgeon: [name], MD;  Location: [facility] ENDOSCOPY;  Service: Endoscopy;  Laterality: N/A;  . EYE SURGERY    . HIP PINNING,CANNULATED Left 12/04/2015   Procedure: CANNULATED HIP PINNING;  Surgeon: Thornton Park, MD;  Location: [facility] ORS;  Service: Orthopedics;  Laterality: Left;     reports that he has been smoking Cigarettes.  He has been smoking about  0.50 packs per day. He has never used smokeless tobacco. He reports that he does not drink alcohol or use drugs.  No Known Allergies  Family History  Problem Relation Age of Onset  . Heart disease Father    Family history has been reviewed and confirmed with patient.   Prior to Admission medications   Medication Sig Start Date End Date Taking? Authorizing Provider  acetaminophen (TYLENOL) 325 MG tablet Take 650 mg by mouth every 4 (four) hours as needed for mild pain or headache.     Historical Provider, MD  albuterol (PROVENTIL) (2.5 MG/3ML) 0.083% nebulizer solution Take 2.5 mg by nebulization every 4 (four) hours as needed for wheezing or shortness of breath.    Historical Provider, MD  aspirin EC 81 MG tablet Take 81 mg by mouth at bedtime.    Historical Provider, MD  atorvastatin (LIPITOR) 80 MG tablet Take 80 mg by mouth at bedtime.    Historical Provider, MD  brimonidine-timolol (COMBIGAN) 0.2-0.5 % ophthalmic solution Place 1 drop into both eyes 2 (two) times daily.    Historical Provider, MD  Calcium Carbonate-Vitamin D (CALCIUM 600+D) 600-400 MG-UNIT per tablet Take 1 tablet by mouth daily.    Historical Provider, MD  carbidopa-levodopa (SINEMET IR) 25-250 MG tablet Take 1 tablet by mouth 4 (four) times daily.    Historical Provider, MD  Carboxymethylcellul-Glycerin (OPTIVE) 0.5-0.9 % SOLN Apply 1-2 drops to eye 3 (three) times daily as needed (for dry eyes).    Historical Provider, MD  cetirizine (ZYRTEC) 10 MG tablet Take 10 mg by mouth at bedtime.    Historical Provider, MD  clopidogrel (PLAVIX) 75 MG tablet Take 75 mg by mouth daily.    Historical Provider, MD  dextrose (GLUTOSE) 40 % GEL Take 1 Tube by mouth.    Historical Provider, MD  fluticasone (FLONASE) 50 MCG/ACT nasal spray Place 2 sprays into both nostrils daily as needed for rhinitis.    Historical Provider, MD  Fluticasone-Salmeterol (ADVAIR) 500-50 MCG/DOSE AEPB Inhale 1 puff into the lungs 2 (two) times daily.      Historical Provider, MD  gabapentin (NEURONTIN) 600 MG tablet Take 600 mg by mouth. 1 AM 1 SUPPERTIME 2 HS    Historical Provider, MD  guaiFENesin (MUCINEX) 600 MG 12 hr tablet Take 600 mg by mouth daily.    Historical Provider, MD  HYDROcodone-acetaminophen (NORCO/VICODIN) 5-325 MG tablet Take 1 tablet by mouth every 6 (six) hours as needed for moderate pain.    Historical Provider, MD  lidocaine (XYLOCAINE) 5 % ointment Apply 1 application topically 2 (two) times daily as needed. TO FEET    Historical Provider, MD  liver oil-zinc oxide (DESITIN) 40 % ointment Apply 1 application topically as needed for irritation.    Historical Provider, MD  metformin (GLUCOPHAGE-XR) 500 MG 24 hr tablet Take 1 tablet (500 mg total) by mouth daily. 12/30/15   Fritzi Mandes, MD  nitroGLYCERIN (NITROSTAT) 0.4 MG SL tablet Place 0.4 mg under the tongue every 5 (five) minutes as needed for chest pain.    Historical Provider, MD  ondansetron (ZOFRAN) 4 MG tablet Take 4 mg by mouth every 8 (eight) hours as needed  for nausea or vomiting.    Historical Provider, MD  QUEtiapine (SEROQUEL) 25 MG tablet Take 25 mg by mouth at bedtime. 04/05/15   Historical Provider, MD  ranitidine (ZANTAC) 150 MG tablet Take 150 mg by mouth at bedtime.    Historical Provider, MD  roflumilast (DALIRESP) 500 MCG TABS tablet Take 500 mcg by mouth daily.    Historical Provider, MD  sennosides-docusate sodium (SENOKOT-S) 8.6-50 MG tablet Take 1 tablet by mouth 2 (two) times daily.    Historical Provider, MD  sulfamethoxazole-trimethoprim (BACTRIM DS,SEPTRA DS) 800-160 MG tablet Take 1 tablet by mouth 2 (two) times daily.    Historical Provider, MD  tiotropium (SPIRIVA) 18 MCG inhalation capsule Place 18 mcg into inhaler and inhale daily. 2 PUFFS    Historical Provider, MD  vitamin B-12 (CYANOCOBALAMIN) 1000 MCG tablet Take 1,000 mcg by mouth daily.    Historical Provider, MD  Vitamin D, Ergocalciferol, (DRISDOL) 50000 units CAPS capsule Take 50,000  Units by mouth every 30 (thirty) days.    Historical Provider, MD    Physical Exam: Vitals:   07/03/16 1911 07/03/16 2000 07/03/16 2017 07/03/16 2030  BP: 103/73 98/75 110/81 107/73  Pulse: (!) 141 (!) 137 (!) 135 (!) 121  Resp: (!) 27 (!) 24 (!) 25 (!) 24  SpO2: 95% 94% 93% 92%  Weight:      Height:        GENERAL: 76 y.o.-year-old male patient, well-developed, well-nourished lying in the bed in no acute distress.  Pleasant and cooperative.   HEENT: Head atraumatic, normocephalic. Pupils equal, round, reactive to light and accommodation. No scleral icterus. Extraocular muscles intact. Nares are patent. Oropharynx is clear. Mucus membranes moist. NECK: Supple, full range of motion. No JVD, no bruit heard. No thyroid enlargement, no tenderness, no cervical lymphadenopathy. CHEST: Normal breath sounds bilaterally. No wheezing, rales, rhonchi or crackles. No use of accessory muscles of respiration.  No reproducible chest wall tenderness.  CARDIOVASCULAR: Regular rhythm, positive S1-S2 No murmurs, rubs, or gallops. Cap refill <2 seconds. Pulses intact distally.  ABDOMEN: Soft, nondistended, nontender. No rebound, guarding, rigidity. Normoactive bowel sounds present in all four quadrants. No organomegaly or mass. EXTREMITIES: No pedal edema, cyanosis, or clubbing. No calf tenderness or Homan's sign.  NEUROLOGIC: The patient is alert and oriented x 3. Cranial nerves II through XII are grossly intact with no focal sensorimotor deficit. Muscle strength 5/5 in all extremities. Sensation intact. Gait not checked. PSYCHIATRIC:  Normal affect, mood, thought content. SKIN: Warm, dry, and intact without obvious rash, lesion, or ulcer.    Labs on Admission:  CBC:  Recent Labs Lab 07/03/16 1921  WBC 11.0*  NEUTROABS 5.1  HGB 15.8  HCT 46.3  MCV 91.0  PLT 0000000   Basic Metabolic Panel:  Recent Labs Lab 07/03/16 1921  NA 140  K 3.4*  CL 103  CO2 26  GLUCOSE 207*  BUN 16  CREATININE  1.06  CALCIUM 8.7*   GFR: Estimated Creatinine Clearance: 57.4 mL/min (by C-G formula based on SCr of 1.06 mg/dL). Liver Function Tests:  Recent Labs Lab 07/03/16 1921  AST 30  ALT 21  ALKPHOS 65  BILITOT 0.9  PROT 6.6  ALBUMIN 3.5   No results for input(s): LIPASE, AMYLASE in the last 168 hours. No results for input(s): AMMONIA in the last 168 hours. Coagulation Profile: No results for input(s): INR, PROTIME in the last 168 hours. Cardiac Enzymes:  Recent Labs Lab 07/03/16 1921  TROPONINI <0.03   BNP (last  3 results) No results for input(s): PROBNP in the last 8760 hours. HbA1C: No results for input(s): HGBA1C in the last 72 hours. CBG: No results for input(s): GLUCAP in the last 168 hours. Lipid Profile: No results for input(s): CHOL, HDL, LDLCALC, TRIG, CHOLHDL, LDLDIRECT in the last 72 hours. Thyroid Function Tests:  Recent Labs  07/03/16 1921  TSH 1.165   Anemia Panel: No results for input(s): VITAMINB12, FOLATE, FERRITIN, TIBC, IRON, RETICCTPCT in the last 72 hours.  Sepsis Labs: @LABRCNTIP (procalcitonin:4,lacticidven:4) )No results found for this or any previous visit (from the past 240 hour(s)).   Radiological Exams on Admission: Dg Chest Port 1 View  Result Date: 07/03/2016 CLINICAL DATA:  Chest pain. EXAM: PORTABLE CHEST 1 VIEW COMPARISON:  Radiographs of December 28, 2015. FINDINGS: The heart size and mediastinal contours are within normal limits. Both lungs are clear. No pneumothorax or pleural effusion is noted. The visualized skeletal structures are unremarkable. IMPRESSION: No acute cardiopulmonary abnormality seen. Electronically Signed   By: Marijo Conception, M.D.   On: 07/03/2016 19:54    EKG: Atrial fibrillation at 137bpm with normal axis, PVCs and nonspecific ST-T wave changes.    ECHO done 03/03/16 Study Conclusions  - Procedure narrative: Transthoracic echocardiography. The study   was technically difficult, as a result of poor  patient compliance   and restricted patient mobility. - Left ventricle: The cavity size was normal. Systolic function was   normal. The estimated ejection fraction was 60%. Wall motion was   normal; there were no regional wall motion abnormalities. Doppler   parameters are consistent with abnormal left ventricular   relaxation (grade 1 diastolic dysfunction). - Atrial septum: No defect or patent foramen ovale was identified.  Impressions:  - No cardiac source of emboli was indentified.  Assessment/Plan  This is a 76 y.o. male with a history of CAD s/p MI, CKD3, COPD, DM, GERD, OSA, PVD  now being admitted with:  1. Chest pain, secondary to new onset atrial fibrillation - Admit to observation with telemetry monitoring. - Trend troponins, check lipids and TSH. - Morphine, nitro, aspirin and statin ordered.   - Start oral Cardizem - Echo done in 10/17.  Repeat given new afib -CHADSVASC score is 5. Will start Lovenox.  - Cardiology consultation has been requested.  2. History of CAD status post MI -Continue aspirin, Plavix, nitroglycerin, atorvastatin  3. History of CKD -Monitor BMP  4. History of COPD -Continue Zyrtec, Flonase, Advair, Spiriva, Daliresp, Mucinex -O2 and Med-Neb therapy as needed  5. H/o Diabetes - Accuchecks achs with RISS coverage - Heart healthy, carb controlled diet -Hold metformin  6. History of GERD -Continue Zantac  7. History of obstructive sleep apnea -May use home CPAP  8. History of peripheral vascular disease -Continue aspirin and Plavix  9. History of peripheral neuropathy -Continue gabapentin  10. History of Parkinson's -Continue Sinemet  11. History of hyperlipidemia -Continue atorvastatin  Patient may continue vitamin D, vitamin B12, calcium and vitamin D, eyedrops, Seroquel   Admission status: Observation, telemetry IV Fluids: Hep-Lock Diet/Nutrition: Heart healthy, carb controlled Consults called: Cardiology  DVT Px:  Lovenox, SCDs and early ambulation. Code Status: Full Code  Disposition Plan: To home in less than 24 hours   All the records are reviewed and case discussed with ED provider. Management plans discussed with the patient and/or family who express understanding and agree with plan of care.  Corneluis Allston D.O. on 07/03/2016 at 9:15 PM Between 7am to 6pm - Pager -  [phone] After 6pm go to [email or web address] - [employer] [facility] [place] [facility] Office [phone] CC: Primary care physician; Gareth Morgan, MD   07/03/2016, 9:15 PM

## 2016-07-03 NOTE — ED Notes (Signed)
Dr. Marcelene Butte the bedside for pt evaluation.

## 2016-07-03 NOTE — ED Notes (Signed)
Admitting MD at the bedside for pt evaluation.  

## 2016-07-04 ENCOUNTER — Observation Stay: Payer: Medicare (Managed Care)

## 2016-07-04 LAB — MAGNESIUM: Magnesium: 1.5 mg/dL — ABNORMAL LOW (ref 1.7–2.4)

## 2016-07-04 LAB — NM MYOCAR MULTI W/SPECT W/WALL MOTION / EF
CHL CUP NUCLEAR SDS: 0
CSEPED: 1 min
CSEPEDS: 5 s
CSEPEW: 1 METS
CSEPPHR: 88 {beats}/min
LV sys vol: 12 mL
LVDIAVOL: 53 mL (ref 62–150)
Rest HR: 73 {beats}/min
SRS: 0
SSS: 0
TID: 0.93

## 2016-07-04 LAB — BASIC METABOLIC PANEL
Anion gap: 7 (ref 5–15)
BUN: 15 mg/dL (ref 6–20)
CO2: 28 mmol/L (ref 22–32)
Calcium: 8.3 mg/dL — ABNORMAL LOW (ref 8.9–10.3)
Chloride: 106 mmol/L (ref 101–111)
Creatinine, Ser: 0.7 mg/dL (ref 0.61–1.24)
GFR calc Af Amer: >60 mL/min (ref >60)
GFR calc non Af Amer: >60 mL/min (ref >60)
Glucose, Bld: 138 mg/dL — ABNORMAL HIGH (ref 65–99)
Potassium: 3.4 mmol/L — ABNORMAL LOW (ref 3.5–5.1)
Sodium: 141 mmol/L (ref 135–145)

## 2016-07-04 LAB — LIPID PANEL
Cholesterol: 87 mg/dL (ref 0–200)
HDL: 29 mg/dL — ABNORMAL LOW (ref >40)
LDL Cholesterol: 27 mg/dL (ref 0–99)
Total CHOL/HDL Ratio: 3 RATIO
Triglycerides: 153 mg/dL — ABNORMAL HIGH (ref <150)
VLDL: 31 mg/dL (ref 0–40)

## 2016-07-04 LAB — TROPONIN I
Troponin I: 0.03 ng/mL (ref <0.03)
Troponin I: 0.03 ng/mL (ref <0.03)
Troponin I: 0.03 ng/mL (ref <0.03)

## 2016-07-04 LAB — GLUCOSE, CAPILLARY
GLUCOSE-CAPILLARY: 126 mg/dL — AB (ref 65–99)
Glucose-Capillary: 141 mg/dL — ABNORMAL HIGH (ref 65–99)

## 2016-07-04 LAB — CBC
HCT: 43.3 % (ref 40.0–52.0)
Hemoglobin: 15.2 g/dL (ref 13.0–18.0)
MCH: 31.8 pg (ref 26.0–34.0)
MCHC: 35.1 g/dL (ref 32.0–36.0)
MCV: 90.7 fL (ref 80.0–100.0)
PLATELETS: 165 10*3/uL (ref 150–440)
RBC: 4.78 MIL/uL (ref 4.40–5.90)
RDW: 13.8 % (ref 11.5–14.5)
WBC: 10.6 10*3/uL (ref 3.8–10.6)

## 2016-07-04 LAB — PHOSPHORUS: Phosphorus: 3.4 mg/dL (ref 2.5–4.6)

## 2016-07-04 LAB — PROTIME-INR
INR: 1.31
Prothrombin Time: 16.4 seconds — ABNORMAL HIGH (ref 11.4–15.2)

## 2016-07-04 MED ORDER — SODIUM CHLORIDE 0.9 % IV SOLN
INTRAVENOUS | Status: DC
  Administered 2016-07-04: 01:00:00 via INTRAVENOUS

## 2016-07-04 MED ORDER — POLYVINYL ALCOHOL 1.4 % OP SOLN
1.0000 [drp] | OPHTHALMIC | Status: DC | PRN
  Filled 2016-07-04: qty 15

## 2016-07-04 MED ORDER — CALCIUM CARBONATE-VITAMIN D 500-200 MG-UNIT PO TABS
1.0000 | ORAL_TABLET | Freq: Every day | ORAL | Status: DC
  Administered 2016-07-04: 10:00:00 1 via ORAL
  Filled 2016-07-04: qty 1

## 2016-07-04 MED ORDER — QUETIAPINE FUMARATE 25 MG PO TABS
25.0000 mg | ORAL_TABLET | Freq: Every day | ORAL | Status: DC
  Administered 2016-07-04: 25 mg via ORAL
  Filled 2016-07-04: qty 1

## 2016-07-04 MED ORDER — IPRATROPIUM BROMIDE 0.02 % IN SOLN: 0.5000 mg | Freq: Four times a day (QID) | RESPIRATORY_TRACT | Status: DC | PRN

## 2016-07-04 MED ORDER — BRIMONIDINE TARTRATE 0.2 % OP SOLN
1.0000 [drp] | Freq: Two times a day (BID) | OPHTHALMIC | Status: DC
  Administered 2016-07-04: 1 [drp] via OPHTHALMIC
  Filled 2016-07-04: qty 5

## 2016-07-04 MED ORDER — MAGNESIUM CITRATE PO SOLN: 1.0000 | Freq: Once | ORAL | Status: DC | PRN

## 2016-07-04 MED ORDER — ACETAMINOPHEN 650 MG RE SUPP: 650.0000 mg | Freq: Four times a day (QID) | RECTAL | Status: DC | PRN

## 2016-07-04 MED ORDER — GABAPENTIN 600 MG PO TABS: 600.0000 mg | ORAL_TABLET | Freq: Three times a day (TID) | ORAL | Status: DC

## 2016-07-04 MED ORDER — TIOTROPIUM BROMIDE MONOHYDRATE 18 MCG IN CAPS
18.0000 ug | ORAL_CAPSULE | Freq: Every day | RESPIRATORY_TRACT | Status: DC
  Administered 2016-07-04: 18 ug via RESPIRATORY_TRACT
  Filled 2016-07-04: qty 5

## 2016-07-04 MED ORDER — MAGNESIUM SULFATE IN D5W 1-5 GM/100ML-% IV SOLN
1.0000 g | Freq: Once | INTRAVENOUS | Status: AC
  Administered 2016-07-04: 1 g via INTRAVENOUS
  Filled 2016-07-04: qty 100

## 2016-07-04 MED ORDER — ASPIRIN EC 81 MG PO TBEC: 81.0000 mg | DELAYED_RELEASE_TABLET | Freq: Every day | ORAL | Status: DC

## 2016-07-04 MED ORDER — ROFLUMILAST 500 MCG PO TABS
500.0000 ug | ORAL_TABLET | Freq: Every day | ORAL | Status: DC
  Administered 2016-07-04: 500 ug via ORAL
  Filled 2016-07-04: qty 1

## 2016-07-04 MED ORDER — SENNOSIDES-DOCUSATE SODIUM 8.6-50 MG PO TABS
1.0000 | ORAL_TABLET | Freq: Two times a day (BID) | ORAL | Status: DC
  Administered 2016-07-04: 1 via ORAL
  Filled 2016-07-04: qty 1

## 2016-07-04 MED ORDER — TECHNETIUM TC 99M TETROFOSMIN IV KIT
30.0000 | PACK | Freq: Once | INTRAVENOUS | Status: AC | PRN
  Administered 2016-07-04: 29.69 via INTRAVENOUS

## 2016-07-04 MED ORDER — ENOXAPARIN SODIUM 100 MG/ML ~~LOC~~ SOLN
1.0000 mg/kg | Freq: Two times a day (BID) | SUBCUTANEOUS | Status: DC
  Administered 2016-07-04 (×2): 85 mg via SUBCUTANEOUS
  Filled 2016-07-04 (×2): qty 1

## 2016-07-04 MED ORDER — DILTIAZEM HCL ER COATED BEADS 120 MG PO CP24
120.0000 mg | ORAL_CAPSULE | Freq: Every day | ORAL | Status: DC
  Administered 2016-07-04 (×2): 120 mg via ORAL
  Filled 2016-07-04 (×2): qty 1

## 2016-07-04 MED ORDER — REGADENOSON 0.4 MG/5ML IV SOLN
0.4000 mg | Freq: Once | INTRAVENOUS | Status: AC
  Administered 2016-07-04: 0.4 mg via INTRAVENOUS

## 2016-07-04 MED ORDER — ONDANSETRON HCL 4 MG PO TABS: 4.0000 mg | ORAL_TABLET | Freq: Four times a day (QID) | ORAL | Status: DC | PRN

## 2016-07-04 MED ORDER — ZINC OXIDE 40 % EX OINT
1.0000 "application " | TOPICAL_OINTMENT | CUTANEOUS | Status: DC | PRN
  Filled 2016-07-04: qty 114

## 2016-07-04 MED ORDER — INSULIN ASPART 100 UNIT/ML ~~LOC~~ SOLN
0.0000 [IU] | Freq: Three times a day (TID) | SUBCUTANEOUS | Status: DC
  Administered 2016-07-04: 1 [IU] via SUBCUTANEOUS
  Filled 2016-07-04: qty 1

## 2016-07-04 MED ORDER — VITAMIN B-12 1000 MCG PO TABS
1000.0000 ug | ORAL_TABLET | Freq: Every day | ORAL | Status: DC
  Administered 2016-07-04: 1000 ug via ORAL
  Filled 2016-07-04: qty 1

## 2016-07-04 MED ORDER — GABAPENTIN 600 MG PO TABS
600.0000 mg | ORAL_TABLET | Freq: Two times a day (BID) | ORAL | Status: DC
  Administered 2016-07-04: 600 mg via ORAL
  Filled 2016-07-04: qty 1

## 2016-07-04 MED ORDER — CARBIDOPA-LEVODOPA 25-250 MG PO TABS
1.0000 | ORAL_TABLET | Freq: Four times a day (QID) | ORAL | Status: DC
  Administered 2016-07-04: 1 via ORAL
  Filled 2016-07-04 (×4): qty 1

## 2016-07-04 MED ORDER — FLUTICASONE PROPIONATE 50 MCG/ACT NA SUSP
2.0000 | Freq: Every day | NASAL | Status: DC | PRN
  Filled 2016-07-04: qty 16

## 2016-07-04 MED ORDER — HYDROCODONE-ACETAMINOPHEN 5-325 MG PO TABS: 1.0000 | ORAL_TABLET | Freq: Four times a day (QID) | ORAL | Status: DC | PRN

## 2016-07-04 MED ORDER — DILTIAZEM HCL ER COATED BEADS 120 MG PO CP24: 120.0000 mg | ORAL_CAPSULE | Freq: Every day | ORAL | 0 refills | Status: DC

## 2016-07-04 MED ORDER — GUAIFENESIN ER 600 MG PO TB12
600.0000 mg | ORAL_TABLET | Freq: Every day | ORAL | Status: DC
  Administered 2016-07-04: 600 mg via ORAL
  Filled 2016-07-04: qty 1

## 2016-07-04 MED ORDER — DEXTROSE 5 % IV SOLN
5.0000 mg/h | INTRAVENOUS | Status: DC
  Administered 2016-07-04: 5 mg/h via INTRAVENOUS
  Filled 2016-07-04 (×2): qty 100

## 2016-07-04 MED ORDER — SENNOSIDES-DOCUSATE SODIUM 8.6-50 MG PO TABS: 1.0000 | ORAL_TABLET | Freq: Every evening | ORAL | Status: DC | PRN

## 2016-07-04 MED ORDER — METOPROLOL TARTRATE 25 MG PO TABS: 25.0000 mg | ORAL_TABLET | Freq: Two times a day (BID) | ORAL | Status: DC

## 2016-07-04 MED ORDER — FAMOTIDINE 20 MG PO TABS: 20.0000 mg | ORAL_TABLET | Freq: Every day | ORAL | Status: DC

## 2016-07-04 MED ORDER — ATORVASTATIN CALCIUM 20 MG PO TABS: 80.0000 mg | ORAL_TABLET | Freq: Every day | ORAL | Status: DC

## 2016-07-04 MED ORDER — MOMETASONE FURO-FORMOTEROL FUM 200-5 MCG/ACT IN AERO
2.0000 | INHALATION_SPRAY | Freq: Two times a day (BID) | RESPIRATORY_TRACT | Status: DC
  Administered 2016-07-04: 2 via RESPIRATORY_TRACT
  Filled 2016-07-04: qty 8.8

## 2016-07-04 MED ORDER — ACETAMINOPHEN 325 MG PO TABS: 650.0000 mg | ORAL_TABLET | Freq: Four times a day (QID) | ORAL | Status: DC | PRN

## 2016-07-04 MED ORDER — LIDOCAINE 5 % EX OINT
1.0000 "application " | TOPICAL_OINTMENT | Freq: Two times a day (BID) | CUTANEOUS | Status: DC | PRN
  Filled 2016-07-04: qty 35.44

## 2016-07-04 MED ORDER — BRIMONIDINE TARTRATE-TIMOLOL 0.2-0.5 % OP SOLN: 1.0000 [drp] | Freq: Two times a day (BID) | OPHTHALMIC | Status: DC

## 2016-07-04 MED ORDER — TIMOLOL MALEATE 0.5 % OP SOLN
1.0000 [drp] | Freq: Two times a day (BID) | OPHTHALMIC | Status: DC
  Administered 2016-07-04: 1 [drp] via OPHTHALMIC
  Filled 2016-07-04: qty 5

## 2016-07-04 MED ORDER — TECHNETIUM TC 99M TETROFOSMIN IV KIT
10.0000 | PACK | Freq: Once | INTRAVENOUS | Status: AC | PRN
  Administered 2016-07-04: 12.03 via INTRAVENOUS

## 2016-07-04 MED ORDER — ONDANSETRON HCL 4 MG/2ML IJ SOLN: 4.0000 mg | Freq: Four times a day (QID) | INTRAMUSCULAR | Status: DC | PRN

## 2016-07-04 MED ORDER — BISACODYL 5 MG PO TBEC: 5.0000 mg | DELAYED_RELEASE_TABLET | Freq: Every day | ORAL | Status: DC | PRN

## 2016-07-04 MED ORDER — POTASSIUM CHLORIDE CRYS ER 20 MEQ PO TBCR
40.0000 meq | EXTENDED_RELEASE_TABLET | Freq: Once | ORAL | Status: AC
  Administered 2016-07-04: 40 meq via ORAL
  Filled 2016-07-04: qty 2

## 2016-07-04 MED ORDER — ALBUTEROL SULFATE (2.5 MG/3ML) 0.083% IN NEBU: 2.5000 mg | INHALATION_SOLUTION | Freq: Four times a day (QID) | RESPIRATORY_TRACT | Status: DC | PRN

## 2016-07-04 MED ORDER — NITROGLYCERIN 0.4 MG SL SUBL: 0.4000 mg | SUBLINGUAL_TABLET | SUBLINGUAL | Status: DC | PRN

## 2016-07-04 MED ORDER — GABAPENTIN 600 MG PO TABS: 1200.0000 mg | ORAL_TABLET | Freq: Every day | ORAL | Status: DC

## 2016-07-04 MED ORDER — APIXABAN 5 MG PO TABS: 5.0000 mg | ORAL_TABLET | Freq: Two times a day (BID) | ORAL | 0 refills | Status: DC

## 2016-07-04 MED ORDER — LORATADINE 10 MG PO TABS
10.0000 mg | ORAL_TABLET | Freq: Every day | ORAL | Status: DC
  Administered 2016-07-04: 10 mg via ORAL
  Filled 2016-07-04: qty 1

## 2016-07-04 MED ORDER — CLOPIDOGREL BISULFATE 75 MG PO TABS
75.0000 mg | ORAL_TABLET | Freq: Every day | ORAL | Status: DC
  Administered 2016-07-04: 75 mg via ORAL
  Filled 2016-07-04: qty 1

## 2016-07-04 MED ORDER — CARBOXYMETHYLCELLUL-GLYCERIN 0.5-0.9 % OP SOLN: 1.0000 [drp] | Freq: Three times a day (TID) | OPHTHALMIC | Status: DC | PRN

## 2016-07-04 NOTE — Care Management (Signed)
Patient is followed by [facility].  Lives alone and is seen in center three times a week.  [facility] can transport home if notified by 2pm today otherwise will have to transport by non emergent ems- even though it is not medically necessary.  Patient was placed in observation for chest pain and currently off the unit for stress test.

## 2016-07-04 NOTE — Progress Notes (Signed)
Patient admitted to the unit about 0100 on a cardizem drip. Patient is currently NSR on the monitor. No complaints of pain. No signs of distress. VSS. Notified physician for Magnesium level of 1.5, new orders placed by Dr. Estanislado Pandy.

## 2016-07-04 NOTE — Progress Notes (Addendum)
[place] at [place] NAME: Jimmy Hill    MR#:  [id number]  DATE OF BIRTH:  05-Aug-1940  SUBJECTIVE:    patient without chest pain over night  REVIEW OF SYSTEMS:    Review of Systems  Constitutional: Negative.  Negative for chills, fever and malaise/fatigue.  HENT: Negative.  Negative for ear discharge, ear pain, hearing loss, nosebleeds and sore throat.   Eyes: Negative.  Negative for blurred vision and pain.  Respiratory: Negative.  Negative for cough, hemoptysis, shortness of breath and wheezing.   Cardiovascular: Negative.  Negative for chest pain, palpitations and leg swelling.  Gastrointestinal: Negative.  Negative for abdominal pain, blood in stool, diarrhea, nausea and vomiting.  Genitourinary: Negative.  Negative for dysuria.  Musculoskeletal: Negative.  Negative for back pain.  Skin: Negative.   Neurological: Negative for dizziness, tremors, speech change, focal weakness, seizures and headaches.  Endo/Heme/Allergies: Negative.  Does not bruise/bleed easily.  Psychiatric/Behavioral: Negative.  Negative for depression, hallucinations and suicidal ideas.    Tolerating Diet: NPO      DRUG ALLERGIES:  No Known Allergies  VITALS:  Blood pressure 115/63, pulse 70, temperature 97.4 F (36.3 C), temperature source Oral, resp. rate 16, height 5\' 8"  (1.727 m), weight 84.2 kg (185 lb 9.6 oz), SpO2 91 %.  PHYSICAL EXAMINATION:   Physical Exam  Constitutional: He is oriented to person, place, and time and well-developed, well-nourished, and in no distress. No distress.  HENT:  Head: Normocephalic.  Eyes: No scleral icterus.  Neck: Normal range of motion. Neck supple. No JVD present. No tracheal deviation present.  Cardiovascular: Normal rate, regular rhythm and normal heart sounds.  Exam reveals no gallop and no friction rub.   No murmur heard. Pulmonary/Chest: Effort normal and breath sounds normal. No respiratory distress. He  has no wheezes. He has no rales. He exhibits no tenderness.  Abdominal: Soft. Bowel sounds are normal. He exhibits no distension and no mass. There is no tenderness. There is no rebound and no guarding.  Musculoskeletal: Normal range of motion. He exhibits no edema.  Neurological: He is alert and oriented to person, place, and time.  Skin: Skin is warm. No rash noted. No erythema.  Psychiatric: Affect and judgment normal.      LABORATORY PANEL:   CBC  Recent Labs Lab 07/04/16 0636  WBC 10.6  HGB 15.2  HCT 43.3  PLT 165   ------------------------------------------------------------------------------------------------------------------  Chemistries   Recent Labs Lab 07/03/16 1921 07/04/16 0105 07/04/16 0636  NA 140  --  141  K 3.4*  --  3.4*  CL 103  --  106  CO2 26  --  28  GLUCOSE 207*  --  138*  BUN 16  --  15  CREATININE 1.06  --  0.70  CALCIUM 8.7*  --  8.3*  MG  --  1.5*  --   AST 30  --   --   ALT 21  --   --   ALKPHOS 65  --   --   BILITOT 0.9  --   --    ------------------------------------------------------------------------------------------------------------------  Cardiac Enzymes  Recent Labs Lab 07/03/16 1921 07/04/16 0105 07/04/16 0636  TROPONINI <0.03 0.03* 0.03*   ------------------------------------------------------------------------------------------------------------------  RADIOLOGY:  Dg Chest Port 1 View  Result Date: 07/03/2016 CLINICAL DATA:  Chest pain. EXAM: PORTABLE CHEST 1 VIEW COMPARISON:  Radiographs of December 28, 2015. FINDINGS: The heart size and mediastinal contours are within normal limits. Both lungs are  clear. No pneumothorax or pleural effusion is noted. The visualized skeletal structures are unremarkable. IMPRESSION: No acute cardiopulmonary abnormality seen. Electronically Signed   By: Marijo Conception, M.D.   On: 07/03/2016 19:54     ASSESSMENT AND PLAN:    76 year old male with history of CAD and stents who  presents with chest pain and new onset atrial fibrillation which has now converted to normal sinus rhythm.   1. Chest pain: Given history of CAD and a pronounced difference in his chest pain from baseline chest pain related to cervical issues, Myoview stress testing is warranted. Discussed with patient's primary care provider and patient.   2. History of CAD status post MI: Continue aspirin, Plavix and atorvastatin  3. New onset atrial fibrillation which has now converted to normal sinus rhythm Follow up on echocardiogram Cardiology consultation ??? Anticoagulation Discontinue diltiazem drip and continue oral diltiazem 4. History of chronic kidney disease stage 3: Creatinine is better than baseline  5. History of COPD without exacerbation Continue inhalers 6. Diabetes: Start sliding scale insulin  7. History of Parkinson's disease with orthostatic hypotension: Continue Sinemet  8. Hypokalemia: Will replete Management plans discussed with the patient and he is in agreement.  CODE STATUS: full  TOTAL TIME TAKING CARE OF THIS PATIENT: 30 minutes.     POSSIBLE D/C today, DEPENDING ON CLINICAL CONDITION.   Jimmy Hill M.D on 07/04/2016 at 8:57 AM  Between 7am to 6pm - Pager - [phone] After 6pm go to [email or web address] - password [email or web address] [facility]  Office  [phone]  CC: Primary care physician; Jimmy Morgan, MD  Note: This dictation was prepared with Dragon dictation along with smaller phrase technology. Any transcriptional errors that result from this process are unintentional.

## 2016-07-04 NOTE — Discharge Summary (Signed)
[place] at [place] NAME: Jimmy Hill    MR#:  [id number]  DATE OF BIRTH:  1940/09/18  DATE OF ADMISSION:  07/03/2016 ADMITTING PHYSICIAN: Harvie Bridge, DO  DATE OF DISCHARGE: 07/04/2016  PRIMARY CARE PHYSICIAN: Gareth Morgan, MD    ADMISSION DIAGNOSIS:  Chest Pain  DISCHARGE DIAGNOSIS:  Active Problems:   New onset atrial fibrillation ([place])   SECONDARY DIAGNOSIS:   Past Medical History:  Diagnosis Date  . Allergic rhinitis   . Anginal pain ([place])   . Arthritis   . Autonomic dysreflexia   . Back pain    CHRONIC LBP,STENOSIS  . CHF (congestive heart failure) ([place])   . Chronic kidney disease    STAGE 3  . COPD (chronic obstructive pulmonary disease) ([place])   . Coronary artery disease   . Depression   . Diabetes ([place])   . Diabetes mellitus without complication ([place])   . DJD (degenerative joint disease)   . Dysphagia   . Edema    FEET/ANKLES  . GERD (gastroesophageal reflux disease)   . Glaucoma   . H/O orthostatic hypotension   . Myocardial infarction   . Parkinson's disease ([place])   . Peripheral vascular disease ([place])   . Polyneuropathy ([place])   . RLS (restless legs syndrome)   . Sleep apnea   . Smoker   . Strabismic amblyopia   . Tinnitus     HOSPITAL COURSE:  76 year old male with history of CAD and stents who presents with chest pain and new onset atrial fibrillation which has now converted to normal sinus rhythm.  1. Chest pain: Patient underwent stress test which was normal. He was evaluated by Dr Clayborn Bigness as well.   2. History of CAD status post MI: He will continue atorvastatin. Instead of ASa and Plavix he is not on eliquis. The combination of all three would increase his risk of GIB three fold.   3. New onset atrial fibrillation which has now converted to normal sinus rhythm/first degree AV block He was initiated on diltiazem drip and converted to oral diltiazem. He will be started on  Eliquis. Side effects alternatives and risks were discussed with him and he accepts these risks.   4. History of chronic kidney disease stage 3: Creatinine is better than baseline  5. History of COPD without exacerbation Continue inhalers  6. Diabetes: Continue outpatient regimen with ADA diet  7. History of Parkinson's disease with orthostatic hypotension: Continue Sinemet  8. Hypokalemia: Repleted    DISCHARGE CONDITIONS AND DIET:   Stable Cardiac /diabetic diet  CONSULTS OBTAINED:  Treatment Team:  Yolonda Kida, MD  DRUG ALLERGIES:  No Known Allergies  DISCHARGE MEDICATIONS:   Current Discharge Medication List    START taking these medications   Details  apixaban (ELIQUIS) 5 MG TABS tablet Take 1 tablet (5 mg total) by mouth 2 (two) times daily. Qty: 60 tablet, Refills: 0    diltiazem (CARDIZEM CD) 120 MG 24 hr capsule Take 1 capsule (120 mg total) by mouth daily. Qty: 30 capsule, Refills: 0      CONTINUE these medications which have NOT CHANGED   Details  acetaminophen (TYLENOL) 325 MG tablet Take 650 mg by mouth every 4 (four) hours as needed for mild pain or headache.     carbidopa-levodopa (SINEMET IR) 25-250 MG tablet Take 1 tablet by mouth 4 (four) times daily.    fludrocortisone (FLORINEF) 0.1 MG tablet Take 0.2 mg by mouth daily.  gabapentin (NEURONTIN) 600 MG tablet Take 600 mg by mouth. 1 AM 1 SUPPERTIME 2 HS    levETIRAcetam (KEPPRA) 500 MG tablet Take 500 mg by mouth 2 (two) times daily.    metFORMIN (GLUCOPHAGE) 850 MG tablet Take 850 mg by mouth 2 (two) times daily with a meal.    oxycodone (OXY-IR) 5 MG capsule Take 10 mg by mouth 2 (two) times daily as needed.    predniSONE (DELTASONE) 10 MG tablet Take 10 mg by mouth as directed. TAPER FROM 6 TABS    sennosides-docusate sodium (SENOKOT-S) 8.6-50 MG tablet Take 1 tablet by mouth 2 (two) times daily.    albuterol (PROVENTIL) (2.5 MG/3ML) 0.083% nebulizer solution Take 2.5 mg by  nebulization every 4 (four) hours as needed for wheezing or shortness of breath.    atorvastatin (LIPITOR) 80 MG tablet Take 80 mg by mouth at bedtime.    brimonidine-timolol (COMBIGAN) 0.2-0.5 % ophthalmic solution Place 1 drop into both eyes 2 (two) times daily.    Calcium Carbonate-Vitamin D (CALCIUM 600+D) 600-400 MG-UNIT per tablet Take 1 tablet by mouth daily.    Carboxymethylcellul-Glycerin (OPTIVE) 0.5-0.9 % SOLN Apply 1-2 drops to eye 3 (three) times daily as needed (for dry eyes).    cetirizine (ZYRTEC) 10 MG tablet Take 10 mg by mouth at bedtime.    dextrose (GLUTOSE) 40 % GEL Take 1 Tube by mouth.    fluticasone (FLONASE) 50 MCG/ACT nasal spray Place 2 sprays into both nostrils daily as needed for rhinitis.    Fluticasone-Salmeterol (ADVAIR) 500-50 MCG/DOSE AEPB Inhale 1 puff into the lungs 2 (two) times daily.     guaiFENesin (MUCINEX) 600 MG 12 hr tablet Take 600 mg by mouth daily.    HYDROcodone-acetaminophen (NORCO/VICODIN) 5-325 MG tablet Take 1 tablet by mouth every 6 (six) hours as needed for moderate pain.    lidocaine (XYLOCAINE) 5 % ointment Apply 1 application topically 2 (two) times daily as needed. TO FEET    liver oil-zinc oxide (DESITIN) 40 % ointment Apply 1 application topically as needed for irritation.    nitroGLYCERIN (NITROSTAT) 0.4 MG SL tablet Place 0.4 mg under the tongue every 5 (five) minutes as needed for chest pain.    ondansetron (ZOFRAN) 4 MG tablet Take 4 mg by mouth every 8 (eight) hours as needed for nausea or vomiting.    QUEtiapine (SEROQUEL) 25 MG tablet Take 25 mg by mouth at bedtime.    ranitidine (ZANTAC) 150 MG tablet Take 150 mg by mouth at bedtime.    roflumilast (DALIRESP) 500 MCG TABS tablet Take 500 mcg by mouth daily.    tiotropium (SPIRIVA) 18 MCG inhalation capsule Place 18 mcg into inhaler and inhale daily. 2 PUFFS    vitamin B-12 (CYANOCOBALAMIN) 1000 MCG tablet Take 1,000 mcg by mouth daily.    Vitamin D,  Ergocalciferol, (DRISDOL) 50000 units CAPS capsule Take 50,000 Units by mouth every 30 (thirty) days.      STOP taking these medications     aspirin EC 81 MG tablet      clopidogrel (PLAVIX) 75 MG tablet      sulfamethoxazole-trimethoprim (BACTRIM DS,SEPTRA DS) 800-160 MG tablet           Today   CHIEF COMPLAINT:  No chest pain overnight   VITAL SIGNS:  Blood pressure 109/69, pulse 70, temperature 97.8 F (36.6 C), resp. rate 12, height 5\' 8"  (1.727 m), weight 82.6 kg (182 lb), SpO2 92 %.   REVIEW OF SYSTEMS:  Review of  Systems  Constitutional: Negative.  Negative for chills, fever and malaise/fatigue.  HENT: Negative.  Negative for ear discharge, ear pain, hearing loss, nosebleeds and sore throat.   Eyes: Negative.  Negative for blurred vision and pain.  Respiratory: Negative.  Negative for cough, hemoptysis, shortness of breath and wheezing.   Cardiovascular: Negative.  Negative for chest pain, palpitations and leg swelling.  Gastrointestinal: Negative.  Negative for abdominal pain, blood in stool, diarrhea, nausea and vomiting.  Genitourinary: Negative.  Negative for dysuria.  Musculoskeletal: Negative.  Negative for back pain.  Skin: Negative.   Neurological: Negative for dizziness, tremors, speech change, focal weakness, seizures and headaches.  Endo/Heme/Allergies: Negative.  Does not bruise/bleed easily.  Psychiatric/Behavioral: Negative.  Negative for depression, hallucinations and suicidal ideas.     PHYSICAL EXAMINATION:  GENERAL:  76 y.o.-year-old patient lying in the bed with no acute distress.  NECK:  Supple, no jugular venous distention. No thyroid enlargement, no tenderness.  LUNGS: Normal breath sounds bilaterally, no wheezing, rales,rhonchi  No use of accessory muscles of respiration.  CARDIOVASCULAR: S1, S2 normal. No murmurs, rubs, or gallops.  ABDOMEN: Soft, non-tender, non-distended. Bowel sounds present. No organomegaly or mass.  EXTREMITIES:  No pedal edema, cyanosis, or clubbing.  PSYCHIATRIC: The patient is alert and oriented x 3.  SKIN: No obvious rash, lesion, or ulcer.   DATA REVIEW:   CBC  Recent Labs Lab 07/04/16 0636  WBC 10.6  HGB 15.2  HCT 43.3  PLT 165    Chemistries   Recent Labs Lab 07/03/16 1921 07/04/16 0105 07/04/16 0636  NA 140  --  141  K 3.4*  --  3.4*  CL 103  --  106  CO2 26  --  28  GLUCOSE 207*  --  138*  BUN 16  --  15  CREATININE 1.06  --  0.70  CALCIUM 8.7*  --  8.3*  MG  --  1.5*  --   AST 30  --   --   ALT 21  --   --   ALKPHOS 65  --   --   BILITOT 0.9  --   --     Cardiac Enzymes  Recent Labs Lab 07/04/16 0105 07/04/16 0636 07/04/16 1242  TROPONINI 0.03* 0.03* 0.03*    Microbiology Results  @[email or web address] @  RADIOLOGY:  Dg Chest Port 1 View  Result Date: 07/03/2016 CLINICAL DATA:  Chest pain. EXAM: PORTABLE CHEST 1 VIEW COMPARISON:  Radiographs of December 28, 2015. FINDINGS: The heart size and mediastinal contours are within normal limits. Both lungs are clear. No pneumothorax or pleural effusion is noted. The visualized skeletal structures are unremarkable. IMPRESSION: No acute cardiopulmonary abnormality seen. Electronically Signed   By: Marijo Conception, M.D.   On: 07/03/2016 19:54      Current Discharge Medication List    START taking these medications   Details  apixaban (ELIQUIS) 5 MG TABS tablet Take 1 tablet (5 mg total) by mouth 2 (two) times daily. Qty: 60 tablet, Refills: 0    diltiazem (CARDIZEM CD) 120 MG 24 hr capsule Take 1 capsule (120 mg total) by mouth daily. Qty: 30 capsule, Refills: 0      CONTINUE these medications which have NOT CHANGED   Details  acetaminophen (TYLENOL) 325 MG tablet Take 650 mg by mouth every 4 (four) hours as needed for mild pain or headache.     carbidopa-levodopa (SINEMET IR) 25-250 MG tablet Take 1 tablet by mouth 4 (four) times daily.  fludrocortisone (FLORINEF) 0.1 MG tablet Take 0.2 mg by mouth daily.     gabapentin (NEURONTIN) 600 MG tablet Take 600 mg by mouth. 1 AM 1 SUPPERTIME 2 HS    levETIRAcetam (KEPPRA) 500 MG tablet Take 500 mg by mouth 2 (two) times daily.    metFORMIN (GLUCOPHAGE) 850 MG tablet Take 850 mg by mouth 2 (two) times daily with a meal.    oxycodone (OXY-IR) 5 MG capsule Take 10 mg by mouth 2 (two) times daily as needed.    predniSONE (DELTASONE) 10 MG tablet Take 10 mg by mouth as directed. TAPER FROM 6 TABS    sennosides-docusate sodium (SENOKOT-S) 8.6-50 MG tablet Take 1 tablet by mouth 2 (two) times daily.    albuterol (PROVENTIL) (2.5 MG/3ML) 0.083% nebulizer solution Take 2.5 mg by nebulization every 4 (four) hours as needed for wheezing or shortness of breath.    atorvastatin (LIPITOR) 80 MG tablet Take 80 mg by mouth at bedtime.    brimonidine-timolol (COMBIGAN) 0.2-0.5 % ophthalmic solution Place 1 drop into both eyes 2 (two) times daily.    Calcium Carbonate-Vitamin D (CALCIUM 600+D) 600-400 MG-UNIT per tablet Take 1 tablet by mouth daily.    Carboxymethylcellul-Glycerin (OPTIVE) 0.5-0.9 % SOLN Apply 1-2 drops to eye 3 (three) times daily as needed (for dry eyes).    cetirizine (ZYRTEC) 10 MG tablet Take 10 mg by mouth at bedtime.    dextrose (GLUTOSE) 40 % GEL Take 1 Tube by mouth.    fluticasone (FLONASE) 50 MCG/ACT nasal spray Place 2 sprays into both nostrils daily as needed for rhinitis.    Fluticasone-Salmeterol (ADVAIR) 500-50 MCG/DOSE AEPB Inhale 1 puff into the lungs 2 (two) times daily.     guaiFENesin (MUCINEX) 600 MG 12 hr tablet Take 600 mg by mouth daily.    HYDROcodone-acetaminophen (NORCO/VICODIN) 5-325 MG tablet Take 1 tablet by mouth every 6 (six) hours as needed for moderate pain.    lidocaine (XYLOCAINE) 5 % ointment Apply 1 application topically 2 (two) times daily as needed. TO FEET    liver oil-zinc oxide (DESITIN) 40 % ointment Apply 1 application topically as needed for irritation.    nitroGLYCERIN (NITROSTAT) 0.4 MG SL  tablet Place 0.4 mg under the tongue every 5 (five) minutes as needed for chest pain.    ondansetron (ZOFRAN) 4 MG tablet Take 4 mg by mouth every 8 (eight) hours as needed for nausea or vomiting.    QUEtiapine (SEROQUEL) 25 MG tablet Take 25 mg by mouth at bedtime.    ranitidine (ZANTAC) 150 MG tablet Take 150 mg by mouth at bedtime.    roflumilast (DALIRESP) 500 MCG TABS tablet Take 500 mcg by mouth daily.    tiotropium (SPIRIVA) 18 MCG inhalation capsule Place 18 mcg into inhaler and inhale daily. 2 PUFFS    vitamin B-12 (CYANOCOBALAMIN) 1000 MCG tablet Take 1,000 mcg by mouth daily.    Vitamin D, Ergocalciferol, (DRISDOL) 50000 units CAPS capsule Take 50,000 Units by mouth every 30 (thirty) days.      STOP taking these medications     aspirin EC 81 MG tablet      clopidogrel (PLAVIX) 75 MG tablet      sulfamethoxazole-trimethoprim (BACTRIM DS,SEPTRA DS) 800-160 MG tablet          Management plans discussed with the patient and he is in agreement. Stable for discharge   Patient should follow up with cardiology and pcp  CODE STATUS:     Code Status Orders  Start     Ordered   07/04/16 0029  Full code  Continuous     07/04/16 0028    Code Status History    Date Active Date Inactive Code Status Order ID Comments User Context   07/04/2016 12:28 AM 07/04/2016 12:28 AM Full Code [id number]  Harvie Bridge, DO Inpatient   03/03/2016 12:13 AM 03/04/2016 10:00 PM Full Code [id number]  Saundra Shelling, MD Inpatient   12/28/2015  5:18 PM 12/30/2015  8:08 PM Full Code [id number]  Henreitta Leber, MD Inpatient   12/03/2015  8:59 PM 12/04/2015  3:30 PM Full Code [id number]  Thornton Park, MD ED   02/05/2015  4:14 PM 02/06/2015  2:04 PM Full Code [id number]  Theodoro Grist, MD Inpatient   12/10/2014 12:47 AM 12/10/2014  4:36 PM Full Code [id number]  Nicholes Mango, MD Inpatient    Advance Directive Documentation   Flowsheet Row Most Recent Value  Type of Advance Directive  Living will   Pre-existing out of facility DNR order (yellow form or pink MOST form)  No data  "MOST" Form in Place?  No data      TOTAL TIME TAKING CARE OF THIS PATIENT: 37 minutes.    Note: This dictation was prepared with Dragon dictation along with smaller phrase technology. Any transcriptional errors that result from this process are unintentional.  Denys Labree M.D on 07/04/2016 at 1:28 PM  Between 7am to 6pm - Pager - [phone] After 6pm go to [email or web address] - password [email or web address] [facility]  Office  [phone]  CC: Primary care physician; Gareth Morgan, MD

## 2016-07-04 NOTE — Progress Notes (Signed)
PT Cancellation Note  Patient Details Name: Jimmy Hill MRN: [id number] DOB: 1941/03/14   Cancelled Treatment:    Reason Eval/Treat Not Completed: Patient at procedure or test/unavailable Pt was out of room for stress test, nursing reports he was still on cardizam drip - will check in this afternoon to see if he is appropriate.   Kreg Shropshire, DPT 07/04/2016, 11:17 AM

## 2016-07-04 NOTE — Care Management (Signed)
Provided with eliquis coupon and informed [facility]

## 2016-07-04 NOTE — Consult Note (Signed)
Reason for Consult: Angina tachycardia probable atrial flutter Referring Physician: Dr. Benjie Karvonen hospitalist  Jimmy Hill is an 76 y.o. male.  HPI: Patient presents with chest pain symptoms angina has known coronary disease history of multiple PCI and stents in the past. Patient has severe COPD Parkinson's hyperlipidemia COPD as well as seizure disorder. Patient has been followed for renal insufficiency as well as well as diabetes he presented with worsening chest discomfort to Emergency room. Patient is usually followed at [facility] for cardiac issues does not remember the last time he had a stress test or cardiac catheterization. Patient states to be pain-free now and is awaiting a functional study  Past Medical History:  Diagnosis Date  . Allergic rhinitis   . Anginal pain ([place])   . Arthritis   . Autonomic dysreflexia   . Back pain    CHRONIC LBP,STENOSIS  . CHF (congestive heart failure) ([place])   . Chronic kidney disease    STAGE 3  . COPD (chronic obstructive pulmonary disease) ([place])   . Coronary artery disease   . Depression   . Diabetes ([place])   . Diabetes mellitus without complication ([place])   . DJD (degenerative joint disease)   . Dysphagia   . Edema    FEET/ANKLES  . GERD (gastroesophageal reflux disease)   . Glaucoma   . H/O orthostatic hypotension   . Myocardial infarction   . Parkinson's disease ([place])   . Peripheral vascular disease ([place])   . Polyneuropathy ([place])   . RLS (restless legs syndrome)   . Sleep apnea   . Smoker   . Strabismic amblyopia   . Tinnitus     Past Surgical History:  Procedure Laterality Date  . ANKLE SURGERY    . APPENDECTOMY    . BACK SURGERY    . CORONARY ANGIOPLASTY     STENT  . CTR    . ESOPHAGOGASTRODUODENOSCOPY (EGD) WITH PROPOFOL N/A 03/04/2016   Procedure: ESOPHAGOGASTRODUODENOSCOPY (EGD) WITH PROPOFOL;  Surgeon: [name], MD;  Location: [facility] ENDOSCOPY;  Service: Endoscopy;  Laterality: N/A;  . EYE SURGERY    . HIP  PINNING,CANNULATED Left 12/04/2015   Procedure: CANNULATED HIP PINNING;  Surgeon: Thornton Park, MD;  Location: [facility] ORS;  Service: Orthopedics;  Laterality: Left;    Family History  Problem Relation Age of Onset  . Heart disease Father     Social History:  reports that he has been smoking Cigarettes.  He has been smoking about 0.50 packs per day. He has never used smokeless tobacco. He reports that he does not drink alcohol or use drugs.  Allergies: No Known Allergies  Medications: I have reviewed the patient's current medications.  Results for orders placed or performed during the hospital encounter of 07/03/16 (from the past 48 hour(s))  Comprehensive metabolic panel     Status: Abnormal   Collection Time: 07/03/16  7:21 PM  Result Value Ref Range   Sodium 140 135 - 145 mmol/L   Potassium 3.4 (L) 3.5 - 5.1 mmol/L   Chloride 103 101 - 111 mmol/L   CO2 26 22 - 32 mmol/L   Glucose, Bld 207 (H) 65 - 99 mg/dL   BUN 16 6 - 20 mg/dL   Creatinine, Ser 1.06 0.61 - 1.24 mg/dL   Calcium 8.7 (L) 8.9 - 10.3 mg/dL   Total Protein 6.6 6.5 - 8.1 g/dL   Albumin 3.5 3.5 - 5.0 g/dL   AST 30 15 - 41 U/L   ALT 21 17 - 63  U/L   Alkaline Phosphatase 65 38 - 126 U/L   Total Bilirubin 0.9 0.3 - 1.2 mg/dL   GFR calc non Af Amer >60 >60 mL/min   GFR calc Af Amer >60 >60 mL/min    Comment: (NOTE) The eGFR has been calculated using the CKD EPI equation. This calculation has not been validated in all clinical situations. eGFR's persistently <60 mL/min signify possible Chronic Kidney Disease.    Anion gap 11 5 - 15  Troponin I     Status: None   Collection Time: 07/03/16  7:21 PM  Result Value Ref Range   Troponin I <0.03 <0.03 ng/mL  CBC with Differential/Platelet     Status: Abnormal   Collection Time: 07/03/16  7:21 PM  Result Value Ref Range   WBC 11.0 (H) 3.8 - 10.6 K/uL   RBC 5.09 4.40 - 5.90 MIL/uL   Hemoglobin 15.8 13.0 - 18.0 g/dL   HCT 46.3 40.0 - 52.0 %   MCV 91.0 80.0 - 100.0 fL    MCH 31.0 26.0 - 34.0 pg   MCHC 34.1 32.0 - 36.0 g/dL   RDW 13.7 11.5 - 14.5 %   Platelets 193 150 - 440 K/uL   Neutrophils Relative % 46 %   Neutro Abs 5.1 1.4 - 6.5 K/uL   Lymphocytes Relative 42 %   Lymphs Abs 4.6 (H) 1.0 - 3.6 K/uL   Monocytes Relative 8 %   Monocytes Absolute 0.9 0.2 - 1.0 K/uL   Eosinophils Relative 3 %   Eosinophils Absolute 0.3 0 - 0.7 K/uL   Basophils Relative 1 %   Basophils Absolute 0.1 0 - 0.1 K/uL  TSH     Status: None   Collection Time: 07/03/16  7:21 PM  Result Value Ref Range   TSH 1.165 0.350 - 4.500 uIU/mL    Comment: Performed by a 3rd Generation assay with a functional sensitivity of <=0.01 uIU/mL.  Magnesium     Status: Abnormal   Collection Time: 07/04/16  1:05 AM  Result Value Ref Range   Magnesium 1.5 (L) 1.7 - 2.4 mg/dL  Phosphorus     Status: None   Collection Time: 07/04/16  1:05 AM  Result Value Ref Range   Phosphorus 3.4 2.5 - 4.6 mg/dL  Troponin I     Status: Abnormal   Collection Time: 07/04/16  1:05 AM  Result Value Ref Range   Troponin I 0.03 (HH) <0.03 ng/mL    Comment: CRITICAL RESULT CALLED TO, READ BACK BY AND VERIFIED WITH MARCEL TURNER @ 0154 ON 07/04/2016 BY CAF   Troponin I     Status: Abnormal   Collection Time: 07/04/16  6:36 AM  Result Value Ref Range   Troponin I 0.03 (HH) <0.03 ng/mL    Comment: CRITICAL VALUE NOTED. VALUE IS CONSISTENT WITH PREVIOUSLY REPORTED/CALLED VALUE. [place].  Basic metabolic panel     Status: Abnormal   Collection Time: 07/04/16  6:36 AM  Result Value Ref Range   Sodium 141 135 - 145 mmol/L   Potassium 3.4 (L) 3.5 - 5.1 mmol/L   Chloride 106 101 - 111 mmol/L   CO2 28 22 - 32 mmol/L   Glucose, Bld 138 (H) 65 - 99 mg/dL   BUN 15 6 - 20 mg/dL   Creatinine, Ser 0.70 0.61 - 1.24 mg/dL   Calcium 8.3 (L) 8.9 - 10.3 mg/dL   GFR calc non Af Amer >60 >60 mL/min   GFR calc Af Amer >60 >60 mL/min  Comment: (NOTE) The eGFR has been calculated using the CKD EPI equation. This calculation  has not been validated in all clinical situations. eGFR's persistently <60 mL/min signify possible Chronic Kidney Disease.    Anion gap 7 5 - 15  Lipid panel     Status: Abnormal   Collection Time: 07/04/16  6:36 AM  Result Value Ref Range   Cholesterol 87 0 - 200 mg/dL   Triglycerides 153 (H) <150 mg/dL   HDL 29 (L) >40 mg/dL   Total CHOL/HDL Ratio 3.0 RATIO   VLDL 31 0 - 40 mg/dL   LDL Cholesterol 27 0 - 99 mg/dL    Comment:        Total Cholesterol/HDL:CHD Risk Coronary Heart Disease Risk Table                     Men   Women  1/2 Average Risk   3.4   3.3  Average Risk       5.0   4.4  2 X Average Risk   9.6   7.1  3 X Average Risk  23.4   11.0        Use the calculated Patient Ratio above and the CHD Risk Table to determine the patient's CHD Risk.        ATP III CLASSIFICATION (LDL):  <100     mg/dL   Optimal  100-129  mg/dL   Near or Above                    Optimal  130-159  mg/dL   Borderline  160-189  mg/dL   High  >190     mg/dL   Very High   CBC     Status: None   Collection Time: 07/04/16  6:36 AM  Result Value Ref Range   WBC 10.6 3.8 - 10.6 K/uL   RBC 4.78 4.40 - 5.90 MIL/uL   Hemoglobin 15.2 13.0 - 18.0 g/dL   HCT 43.3 40.0 - 52.0 %   MCV 90.7 80.0 - 100.0 fL   MCH 31.8 26.0 - 34.0 pg   MCHC 35.1 32.0 - 36.0 g/dL   RDW 13.8 11.5 - 14.5 %   Platelets 165 150 - 440 K/uL  Protime-INR     Status: Abnormal   Collection Time: 07/04/16  6:36 AM  Result Value Ref Range   Prothrombin Time 16.4 (H) 11.4 - 15.2 seconds   INR 1.31   Glucose, capillary     Status: Abnormal   Collection Time: 07/04/16  9:26 AM  Result Value Ref Range   Glucose-Capillary 141 (H) 65 - 99 mg/dL    Dg Chest Port 1 View  Result Date: 07/03/2016 CLINICAL DATA:  Chest pain. EXAM: PORTABLE CHEST 1 VIEW COMPARISON:  Radiographs of December 28, 2015. FINDINGS: The heart size and mediastinal contours are within normal limits. Both lungs are clear. No pneumothorax or pleural effusion is  noted. The visualized skeletal structures are unremarkable. IMPRESSION: No acute cardiopulmonary abnormality seen. Electronically Signed   By: Marijo Conception, M.D.   On: 07/03/2016 19:54    Review of Systems  Constitutional: Positive for diaphoresis and malaise/fatigue.  HENT: Positive for congestion.   Eyes: Negative.   Respiratory: Positive for shortness of breath.   Cardiovascular: Positive for chest pain and orthopnea.  Gastrointestinal: Positive for heartburn.  Genitourinary: Negative.   Musculoskeletal: Negative.   Skin: Negative.   Neurological: Positive for weakness.  Endo/Heme/Allergies: Negative.  Psychiatric/Behavioral: Negative.    Blood pressure 123/73, pulse 72, temperature 97.4 F (36.3 C), temperature source Oral, resp. rate 18, height '5\' 8"'  (1.727 m), weight 84.2 kg (185 lb 9.6 oz), SpO2 91 %. Physical Exam  Nursing note and vitals reviewed. Constitutional: He is oriented to person, place, and time. He appears well-developed and well-nourished.  HENT:  Head: Normocephalic and atraumatic.  Eyes: Conjunctivae and EOM are normal. Pupils are equal, round, and reactive to light.  Neck: Normal range of motion. Neck supple.  Cardiovascular: Normal rate and regular rhythm.   Murmur heard. Respiratory: Effort normal and breath sounds normal.  GI: Soft. Bowel sounds are normal.  Musculoskeletal: Normal range of motion.  Neurological: He is alert and oriented to person, place, and time. He has normal reflexes.  Skin: Skin is warm and dry.  Psychiatric: He has a normal mood and affect.    Assessment/Plan: Angina Coronary disease Tachycardia probably atrial flutter Chest pain COPD Diabetes Obstructive sleep apnea History of PCI and stent Altered mental status Diabetes type 2 Coronary disease Arterial sclerotic vascular disease Parkinson's  Dwayne D Callwood 07/04/2016, 9:47 AM     . PLAN Agree with admitted rule out for myocardial infarction Recommend  weaning Cardizem drip to by mouth Agree with functional study for chest pain and angina Continue Sinemet for probable Parkinson Agree with For seizure disorder Recommend continue inhalers for COPD shortness of breath Maintain Plavix therapy for nuclear sclerotic vascular disease Continue Daliresp for COPD pulmonary hypertension  Continue Lipitor for lipid management

## 2016-07-04 NOTE — Progress Notes (Signed)
Discharge instructions explained to pt and PACE associate/ iv and tele removed/ personal belongings and home meds returned to pt/ PACE program provided ride

## 2016-07-04 NOTE — Progress Notes (Signed)
Family Meeting Note  Advance Directive:yes  Today a meeting took place with the Patient.  The following clinical team members were present during this meeting:MD  The following were discussed:Patient's diagnosis: chest pain hx CAD Hx Diabetes  Patient's progosis: > 12 months and Goals for treatment: Full Code  Additional follow-up to be provided: none  Time spent during discussion:16 minutes Jimmy Carmer, MD

## 2016-07-04 NOTE — Evaluation (Signed)
Physical Therapy Evaluation Patient Details Name: Jimmy Hill MRN: [id number] DOB: 1940/08/25 Today's Date: 07/04/2016   History of Present Illness  76 y.o. male with a known history of CAD s/p MI with stents, CKD3, COPD, DM, GERD, OSA, PVD.  Came in with chest pain and under observation status with new onset afib.   Clinical Impression  Pt did well with PT, was eager to work with PT and then get home.  He feels good about is safety at home with the assist that he has (aides QD, PACE 3x/wk).  Overall he did well with mobility and ambulation and despite some forward leaning (and reliance on the walker) he ultimately was safe and efficient with in-home distances.  Pt should be able to go home on d/c w/o issue.     Follow Up Recommendations  (continue with the PACE program PT)    Equipment Recommendations       Recommendations for Other Services Rehab consult     Precautions / Restrictions Precautions Precautions: Fall Restrictions Weight Bearing Restrictions: No      Mobility  Bed Mobility Overal bed mobility: Independent             General bed mobility comments: Pt able to get in/out of bed w/o assist  Transfers Overall transfer level: Independent Equipment used: Rolling walker (2 wheeled)             General transfer comment: Pt able to safely get up/down from EOB with walker use but ultimately safe and confident  Ambulation/Gait Ambulation/Gait assistance: Supervision Ambulation Distance (Feet): 200 Feet Assistive device: Rolling walker (2 wheeled)       General Gait Details: Pt with forward hunched posture, but reports being essentially at baseline and actually walked more than he typically does at baseline  Stairs            Wheelchair Mobility    Modified Rankin (Stroke Patients Only)       Balance Overall balance assessment: Modified Independent                                           Pertinent Vitals/Pain Pain  Assessment: No/denies pain    Home Living Family/patient expects to be discharged to:: Private residence Living Arrangements: Alone Available Help at Discharge: Personal care attendant;Family (PACE program, aides QD)   Home Access: Level entry     Home Layout: One level Home Equipment: Wheelchair - manual;Wheelchair - power;Youth worker - 2 wheels;Walker - 4 wheels      Prior Function Level of Independence: Independent with assistive device(s)         Comments: Pt reports that he does not normally do a lot of ambulation but is able to do all he needs at home     Hand Dominance        Extremity/Trunk Assessment   Upper Extremity Assessment Upper Extremity Assessment: Overall WFL for tasks assessed    Lower Extremity Assessment Lower Extremity Assessment: Overall WFL for tasks assessed;Generalized weakness       Communication   Communication: No difficulties  Cognition Arousal/Alertness: Awake/alert Behavior During Therapy: Restless Overall Cognitive Status: Within Functional Limits for tasks assessed                      General Comments      Exercises     Assessment/Plan  PT Assessment Patient needs continued PT services  PT Problem List Decreased strength;Decreased activity tolerance;Decreased balance;Decreased safety awareness;Decreased knowledge of use of DME          PT Treatment Interventions Gait training;Therapeutic activities;Therapeutic exercise;Functional mobility training;Balance training;DME instruction    PT Goals (Current goals can be found in the Care Plan section)  Acute Rehab PT Goals Patient Stated Goal: Pt very eager to get home PT Goal Formulation: With patient Time For Goal Achievement: 07/18/16 Potential to Achieve Goals: Good    Frequency Min 2X/week   Barriers to discharge        Co-evaluation               End of Session Equipment Utilized During Treatment: Gait belt Activity Tolerance:  Patient tolerated treatment well;Patient limited by fatigue Patient left: with call bell/phone within reach;with bed alarm set      Functional Assessment Tool Used: clinical judgement Functional Limitation: Mobility: Walking and moving around Mobility: Walking and Moving Around Current Status [id number]): At least 1 percent but less than 20 percent impaired, limited or restricted Mobility: Walking and Moving Around Goal Status [phone]): At least 1 percent but less than 20 percent impaired, limited or restricted Mobility: Walking and Moving Around Discharge Status [phone]): At least 1 percent but less than 20 percent impaired, limited or restricted    Time: 1335-1352 PT Time Calculation (min) (ACUTE ONLY): 17 min   Charges:   PT Evaluation $PT Eval Low Complexity: 1 Procedure     PT G Codes:   PT G-Codes **NOT FOR INPATIENT CLASS** Functional Assessment Tool Used: clinical judgement Functional Limitation: Mobility: Walking and moving around Mobility: Walking and Moving Around Current Status [id number]): At least 1 percent but less than 20 percent impaired, limited or restricted Mobility: Walking and Moving Around Goal Status [phone]): At least 1 percent but less than 20 percent impaired, limited or restricted Mobility: Walking and Moving Around Discharge Status [phone]): At least 1 percent but less than 20 percent impaired, limited or restricted    Kreg Shropshire, DPT 07/04/2016, 2:44 PM

## 2016-07-04 NOTE — Care Management Obs Status (Signed)
[place] NOTIFICATION   Patient Details  Name: Jimmy Hill MRN: [id number] Date of Birth: 1940-09-24   Medicare Observation Status Notification Given:  Yes    Katrina Stack, RN 07/04/2016, 8:58 AM

## 2016-07-04 NOTE — Progress Notes (Signed)
ANTICOAGULATION CONSULT NOTE - Initial Consult  Pharmacy Consult for enoxaparin Indication: atrial fibrillation  No Known Allergies  Patient Measurements: Height: 5\' 8"  (172.7 cm) Weight: 185 lb 9.6 oz (84.2 kg) IBW/kg (Calculated) : 68.4 Heparin Dosing Weight:   Vital Signs: Temp: 97.4 F (36.3 C) (02/16 0029) Temp Source: Oral (02/16 0029) BP: 123/74 (02/16 0103) Pulse Rate: 76 (02/16 0103)  Labs:  Recent Labs  07/03/16 1921  HGB 15.8  HCT 46.3  PLT 193  CREATININE 1.06  TROPONINI <0.03    Estimated Creatinine Clearance: 62.6 mL/min (by C-G formula based on SCr of 1.06 mg/dL).   Medical History: Past Medical History:  Diagnosis Date  . Allergic rhinitis   . Anginal pain ([place])   . Arthritis   . Autonomic dysreflexia   . Back pain    CHRONIC LBP,STENOSIS  . CHF (congestive heart failure) ([place])   . Chronic kidney disease    STAGE 3  . COPD (chronic obstructive pulmonary disease) ([place])   . Coronary artery disease   . Depression   . Diabetes ([place])   . Diabetes mellitus without complication ([place])   . DJD (degenerative joint disease)   . Dysphagia   . Edema    FEET/ANKLES  . GERD (gastroesophageal reflux disease)   . Glaucoma   . H/O orthostatic hypotension   . Myocardial infarction   . Parkinson's disease ([place])   . Peripheral vascular disease ([place])   . Polyneuropathy ([place])   . RLS (restless legs syndrome)   . Sleep apnea   . Smoker   . Strabismic amblyopia   . Tinnitus     Medications:  Infusions:  . sodium chloride 75 mL/hr at 07/04/16 0052  . diltiazem (CARDIZEM) infusion      Assessment: 76 yom cc CP with new onset AF. Pharmacy consulted to dose [facility] for AF.   Goal of Therapy:  Anti-Xa level 0.6-1 units/ml 4hrs after LMWH dose given Monitor platelets by anticoagulation protocol: Yes   Plan:  Lovenox 1 mg/kg subcutaneously BID  Laural Benes, Pharm.D., BCPS Clinical Pharmacist 07/04/2016,1:12 AM

## 2016-07-05 LAB — HEMOGLOBIN A1C
HEMOGLOBIN A1C: 9.6 % — AB (ref 4.8–5.6)
Mean Plasma Glucose: 229 mg/dL

## 2016-08-19 ENCOUNTER — Encounter: Payer: Self-pay | Admitting: *Deleted

## 2016-08-25 MED ORDER — ARMC OPHTHALMIC DILATING DROPS
1.0000 | OPHTHALMIC | Status: DC | PRN
Start: 2016-08-25 — End: 2016-08-26

## 2016-08-25 MED ORDER — MOXIFLOXACIN HCL 0.5 % OP SOLN: 1.0000 [drp] | OPHTHALMIC | Status: DC | PRN

## 2016-08-26 ENCOUNTER — Ambulatory Visit: Payer: Medicare (Managed Care) | Admitting: Anesthesiology

## 2016-08-26 ENCOUNTER — Encounter
Admission: RE | Disposition: A | Discharge status: Home / Self Care | Payer: Self-pay | Source: Ambulatory Visit | Attending: Ophthalmology

## 2016-08-26 ENCOUNTER — Ambulatory Visit
Admission: RE | Admit: 2016-08-26 | Discharge: 2016-08-26 | Disposition: A | Discharge status: Home / Self Care | Payer: Medicare (Managed Care) | Source: Ambulatory Visit | Attending: Ophthalmology | Admitting: Ophthalmology

## 2016-08-26 DIAGNOSIS — Z7984 Long term (current) use of oral hypoglycemic drugs: Secondary | ICD-10-CM | POA: Diagnosis not present

## 2016-08-26 DIAGNOSIS — Z955 Presence of coronary angioplasty implant and graft: Secondary | ICD-10-CM | POA: Diagnosis not present

## 2016-08-26 DIAGNOSIS — M1990 Unspecified osteoarthritis, unspecified site: Secondary | ICD-10-CM | POA: Insufficient documentation

## 2016-08-26 DIAGNOSIS — I4891 Unspecified atrial fibrillation: Secondary | ICD-10-CM | POA: Insufficient documentation

## 2016-08-26 DIAGNOSIS — G20 Parkinson's disease: Secondary | ICD-10-CM | POA: Diagnosis not present

## 2016-08-26 DIAGNOSIS — G4730 Sleep apnea, unspecified: Secondary | ICD-10-CM | POA: Insufficient documentation

## 2016-08-26 DIAGNOSIS — N183 Chronic kidney disease, stage 3 (moderate): Secondary | ICD-10-CM | POA: Insufficient documentation

## 2016-08-26 DIAGNOSIS — F329 Major depressive disorder, single episode, unspecified: Secondary | ICD-10-CM | POA: Insufficient documentation

## 2016-08-26 DIAGNOSIS — E7800 Pure hypercholesterolemia, unspecified: Secondary | ICD-10-CM | POA: Insufficient documentation

## 2016-08-26 DIAGNOSIS — F17200 Nicotine dependence, unspecified, uncomplicated: Secondary | ICD-10-CM | POA: Diagnosis not present

## 2016-08-26 DIAGNOSIS — J449 Chronic obstructive pulmonary disease, unspecified: Secondary | ICD-10-CM | POA: Diagnosis not present

## 2016-08-26 DIAGNOSIS — K219 Gastro-esophageal reflux disease without esophagitis: Secondary | ICD-10-CM | POA: Insufficient documentation

## 2016-08-26 DIAGNOSIS — I509 Heart failure, unspecified: Secondary | ICD-10-CM | POA: Insufficient documentation

## 2016-08-26 DIAGNOSIS — E1136 Type 2 diabetes mellitus with diabetic cataract: Secondary | ICD-10-CM | POA: Diagnosis present

## 2016-08-26 DIAGNOSIS — I252 Old myocardial infarction: Secondary | ICD-10-CM | POA: Diagnosis not present

## 2016-08-26 DIAGNOSIS — I2510 Atherosclerotic heart disease of native coronary artery without angina pectoris: Secondary | ICD-10-CM | POA: Diagnosis not present

## 2016-08-26 DIAGNOSIS — I110 Hypertensive heart disease with heart failure: Secondary | ICD-10-CM | POA: Insufficient documentation

## 2016-08-26 HISTORY — DX: Pure hypercholesterolemia, unspecified: E78.00

## 2016-08-26 HISTORY — DX: Unspecified convulsions: R56.9

## 2016-08-26 HISTORY — DX: Spinal stenosis, site unspecified: M48.00

## 2016-08-26 HISTORY — DX: Cardiac arrhythmia, unspecified: I49.9

## 2016-08-26 HISTORY — PX: CATARACT EXTRACTION W/PHACO: SHX586

## 2016-08-26 LAB — GLUCOSE, CAPILLARY: GLUCOSE-CAPILLARY: 243 mg/dL — AB (ref 65–99)

## 2016-08-26 SURGERY — PHACOEMULSIFICATION, CATARACT, WITH IOL INSERTION
Anesthesia: Monitor Anesthesia Care | Site: Eye | Laterality: Left | Wound class: Clean

## 2016-08-26 MED ORDER — POVIDONE-IODINE 5 % OP SOLN
OPHTHALMIC | Status: DC | PRN
  Administered 2016-08-26: 1 via OPHTHALMIC

## 2016-08-26 MED ORDER — MOXIFLOXACIN HCL 0.5 % OP SOLN: 1.0000 [drp] | OPHTHALMIC | Status: DC | PRN

## 2016-08-26 MED ORDER — CARBACHOL 0.01 % IO SOLN
INTRAOCULAR | Status: DC | PRN
  Administered 2016-08-26: .5 mL via INTRAOCULAR

## 2016-08-26 MED ORDER — EPINEPHRINE PF 1 MG/ML IJ SOLN
INTRAMUSCULAR | Status: AC
  Filled 2016-08-26: qty 2

## 2016-08-26 MED ORDER — SODIUM CHLORIDE 0.9 % IV SOLN: INTRAVENOUS | Status: DC

## 2016-08-26 MED ORDER — LIDOCAINE HCL (PF) 4 % IJ SOLN
INTRAOCULAR | Status: DC | PRN
  Administered 2016-08-26: 2 mL via OPHTHALMIC

## 2016-08-26 MED ORDER — EPINEPHRINE PF 1 MG/ML IJ SOLN
INTRAMUSCULAR | Status: DC | PRN
  Administered 2016-08-26: 1 mL via OPHTHALMIC

## 2016-08-26 MED ORDER — ARMC OPHTHALMIC DILATING DROPS
OPHTHALMIC | Status: AC
  Administered 2016-08-26: 1 via OPHTHALMIC
  Filled 2016-08-26: qty 0.4

## 2016-08-26 MED ORDER — NA CHONDROIT SULF-NA HYALURON 40-17 MG/ML IO SOLN
INTRAOCULAR | Status: DC | PRN
  Administered 2016-08-26: 1 mL via INTRAOCULAR

## 2016-08-26 MED ORDER — ARMC OPHTHALMIC DILATING DROPS
1.0000 "application " | OPHTHALMIC | Status: AC
  Administered 2016-08-26 (×3): 1 via OPHTHALMIC

## 2016-08-26 MED ORDER — MOXIFLOXACIN HCL 0.5 % OP SOLN
OPHTHALMIC | Status: DC | PRN
  Administered 2016-08-26: .2 mL via OPHTHALMIC

## 2016-08-26 MED ORDER — SODIUM CHLORIDE 0.9 % IV SOLN
INTRAVENOUS | Status: DC
  Administered 2016-08-26: 11:00:00 via INTRAVENOUS

## 2016-08-26 MED ORDER — FENTANYL CITRATE (PF) 100 MCG/2ML IJ SOLN
INTRAMUSCULAR | Status: AC
  Filled 2016-08-26: qty 2

## 2016-08-26 MED ORDER — MOXIFLOXACIN HCL 0.5 % OP SOLN
OPHTHALMIC | Status: AC
  Filled 2016-08-26: qty 3

## 2016-08-26 MED ORDER — FENTANYL CITRATE (PF) 100 MCG/2ML IJ SOLN
INTRAMUSCULAR | Status: DC | PRN
  Administered 2016-08-26: 12.5 ug via INTRAVENOUS

## 2016-08-26 SURGICAL SUPPLY — 21 items
CANNULA ANT/CHMB 27GA (MISCELLANEOUS) ×3 IMPLANT
CUP MEDICINE 2OZ PLAST GRAD ST (MISCELLANEOUS) ×3 IMPLANT
GLOVE BIO SURGEON STRL SZ8 (GLOVE) ×3 IMPLANT
GLOVE BIOGEL M 6.5 STRL (GLOVE) ×3 IMPLANT
GLOVE SURG LX 8.0 MICRO (GLOVE) ×2
GLOVE SURG LX STRL 8.0 MICRO (GLOVE) ×1 IMPLANT
GOWN STRL REUS W/ TWL LRG LVL3 (GOWN DISPOSABLE) ×2 IMPLANT
GOWN STRL REUS W/TWL LRG LVL3 (GOWN DISPOSABLE) ×4
LENS IOL ACRYSOF IQ 21.0 (Intraocular Lens) ×3 IMPLANT
PACK CATARACT (MISCELLANEOUS) ×3 IMPLANT
PACK CATARACT BRASINGTON LX (MISCELLANEOUS) ×3 IMPLANT
PACK EYE AFTER SURG (MISCELLANEOUS) ×3 IMPLANT
SOL BSS BAG (MISCELLANEOUS) ×3
SOL PREP PVP 2OZ (MISCELLANEOUS) ×3
SOLUTION BSS BAG (MISCELLANEOUS) ×1 IMPLANT
SOLUTION PREP PVP 2OZ (MISCELLANEOUS) ×1 IMPLANT
SYR 3ML LL SCALE MARK (SYRINGE) ×3 IMPLANT
SYR 5ML LL (SYRINGE) ×3 IMPLANT
SYR TB 1ML 27GX1/2 LL (SYRINGE) ×3 IMPLANT
WATER STERILE IRR 250ML POUR (IV SOLUTION) ×3 IMPLANT
WIPE NON LINTING 3.25X3.25 (MISCELLANEOUS) ×3 IMPLANT

## 2016-08-26 NOTE — Op Note (Signed)
PREOPERATIVE DIAGNOSIS:  Nuclear sclerotic cataract of the left eye.   POSTOPERATIVE DIAGNOSIS:  nuclear sclerotic cataract left eye   OPERATIVE PROCEDURE:  Procedure(s): CATARACT EXTRACTION PHACO AND INTRAOCULAR LENS PLACEMENT (IOC)   SURGEON:  Birder Robson, MD.   ANESTHESIA:   Anesthesiologist: Emmie Niemann, MD CRNA: Dionne Bucy, CRNA  1.      Managed anesthesia care. 2.      Topical tetracaine drops followed by 2% Xylocaine jelly applied in the preoperative holding area.   COMPLICATIONS:  None.   TECHNIQUE:   Stop and chop   DESCRIPTION OF PROCEDURE:  The patient was examined and consented in the preoperative holding area where the aforementioned topical anesthesia was applied to the left eye and then brought back to the Operating Room where the left eye was prepped and draped in the usual sterile ophthalmic fashion and a lid speculum was placed. A paracentesis was created with the side port blade and the anterior chamber was filled with viscoelastic. A near clear corneal incision was performed with the steel keratome. A continuous curvilinear capsulorrhexis was performed with a cystotome followed by the capsulorrhexis forceps. Hydrodissection and hydrodelineation were carried out with BSS on a blunt cannula. The lens was removed in a stop and chop  technique and the remaining cortical material was removed with the irrigation-aspiration handpiece. The capsular bag was inflated with viscoelastic and the Technis [id number] lens was placed in the capsular bag without complication. The remaining viscoelastic was removed from the eye with the irrigation-aspiration handpiece. The wounds were hydrated. The anterior chamber was flushed with Miostat and the eye was inflated to physiologic pressure. 0.1 mL Vigamox was placed in the anterior chamber. The wounds were found to be water tight. The eye was dressed with Vigamox. The patient was given protective glasses to wear throughout the day and a  shield with which to sleep tonight. The patient was also given drops with which to begin a drop regimen today and will follow-up with me in one day.  Implant Name Type Inv. Item Serial No. Manufacturer Lot No. LRB No. Used  LENS IOL ACRYSOF IQ 21.0 - [id number] Intraocular Lens LENS IOL ACRYSOF IQ 21.0 [id number] ALCON   Left 1   Procedure(s) with comments: CATARACT EXTRACTION PHACO AND INTRAOCULAR LENS PLACEMENT (IOC) (Left) - [place] 01:37 AP% 19.4 CDE 18.97 fluid pack lot # [id number]  Electronically signed: [place] 08/26/2016 12:07 PM

## 2016-08-26 NOTE — Transfer of Care (Signed)
Immediate Anesthesia Transfer of Care Note  Patient: Jimmy Hill  Procedure(s) Performed: Procedure(s) with comments: CATARACT EXTRACTION PHACO AND INTRAOCULAR LENS PLACEMENT (IOC) (Left) - [place] 01:37 AP% 19.4 CDE 18.97 fluid pack lot # [id number]  Patient Location: PACU and Short Stay  Anesthesia Type:MAC  Level of Consciousness: awake and patient cooperative  Airway & Oxygen Therapy: Patient Spontanous Breathing  Post-op Assessment: Report given to RN and Post -op Vital signs reviewed and stable  Post vital signs: Reviewed and stable  Last Vitals:  Vitals:   08/26/16 1009  BP: 112/66  Pulse: 78  Resp: 16  Temp: (!) 36.1 C    Last Pain:  Vitals:   08/26/16 1009  TempSrc: Tympanic         Complications: No apparent anesthesia complications

## 2016-08-26 NOTE — OR Nursing (Signed)
Patient   Poor historian with medications because they are delivered to him in a "bubble pack"  Patient also drank coffee with cream today at 0730 anesthesia made aware.

## 2016-08-26 NOTE — H&P (Signed)
All labs reviewed. Abnormal studies sent to patients PCP when indicated.  Previous H&P reviewed, patient examined, there are NO CHANGES.  Jimmy Hill LOUIS4/10/201811:38 AM

## 2016-08-26 NOTE — Discharge Instructions (Signed)
Eye Surgery Discharge Instructions  Expect mild scratchy sensation or mild soreness. DO NOT RUB YOUR EYE!  The day of surgery:  Minimal physical activity, but bed rest is not required  No reading, computer work, or close hand work  No bending, lifting, or straining.  May watch TV  For 24 hours:  No driving, legal decisions, or alcoholic beverages  Safety precautions  Eat anything you prefer: It is better to start with liquids, then soup then solid foods.  _____ Eye patch should be worn until postoperative exam tomorrow.  ____ Solar shield eyeglasses should be worn for comfort in the sunlight/patch while sleeping  Resume all regular medications including aspirin or Coumadin if these were discontinued prior to surgery. You may shower, bathe, shave, or wash your hair. Tylenol may be taken for mild discomfort.  Call your doctor if you experience significant pain, nausea, or vomiting, fever > 101 or other signs of infection. [phone] or [phone] Specific instructions:  Follow-up Information    PORFILIO,WILLIAM LOUIS, MD Follow up.   Specialty:  Ophthalmology Why:  April 11 at 10:50am Contact information: [address] [place] [address] [phone]

## 2016-08-26 NOTE — Anesthesia Postprocedure Evaluation (Signed)
Anesthesia Post Note  Patient: Jimmy Hill  Procedure(s) Performed: Procedure(s) (LRB): CATARACT EXTRACTION PHACO AND INTRAOCULAR LENS PLACEMENT (IOC) (Left)  Patient location during evaluation: Short Stay Anesthesia Type: MAC Level of consciousness: awake and alert and patient cooperative Pain management: satisfactory to patient Vital Signs Assessment: post-procedure vital signs reviewed and stable Respiratory status: respiratory function stable and spontaneous breathing Cardiovascular status: stable and blood pressure returned to baseline Postop Assessment: no signs of nausea or vomiting and no headache Anesthetic complications: no     Last Vitals:  Vitals:   08/26/16 1009 08/26/16 1210  BP: 112/66 (!) 108/54  Pulse: 78 70  Resp: 16 16  Temp: (!) 36.1 C (!) 35.9 C    Last Pain:  Vitals:   08/26/16 1009  TempSrc: Tympanic                 Nadene Rubins

## 2016-08-26 NOTE — Anesthesia Post-op Follow-up Note (Cosign Needed)
Anesthesia QCDR form completed.        

## 2016-08-26 NOTE — Anesthesia Preprocedure Evaluation (Signed)
Anesthesia Evaluation  Patient identified by MRN, date of birth, ID band Patient awake    Reviewed: Allergy & Precautions, NPO status , Patient's Chart, lab work & pertinent test results  History of Anesthesia Complications Negative for: history of anesthetic complications  Airway Mallampati: II  TM Distance: >3 FB Neck ROM: Full    Dental  (+) Upper Dentures, Lower Dentures   Pulmonary sleep apnea , COPD,  COPD inhaler, Current Smoker,    breath sounds clear to auscultation- rhonchi (-) wheezing      Cardiovascular Exercise Tolerance: Good hypertension, + CAD, + Past MI, + Cardiac Stents (pt thinks last stent was about 53moago) and +CHF  + dysrhythmias Atrial Fibrillation  Rhythm:Regular Rate:Normal - Systolic murmurs and - Diastolic murmurs    Neuro/Psych Seizures -,  PSYCHIATRIC DISORDERS Depression    GI/Hepatic Neg liver ROS, GERD  ,  Endo/Other  diabetes, Oral Hypoglycemic Agents  Renal/GU Renal InsufficiencyRenal disease     Musculoskeletal  (+) Arthritis ,   Abdominal (+) - obese,   Peds  Hematology negative hematology ROS (+)   Anesthesia Other Findings Past Medical History: No date: Allergic rhinitis No date: Anginal pain (HCC) No date: Arthritis No date: Autonomic dysreflexia No date: Back pain     Comment: CHRONIC LBP,STENOSIS No date: CHF (congestive heart failure) (HCC) No date: Chronic kidney disease     Comment: STAGE 3 No date: COPD (chronic obstructive pulmonary disease) (* No date: Coronary artery disease No date: Depression No date: Diabetes (HCC) No date: Diabetes mellitus without complication (HCC) No date: DJD (degenerative joint disease) No date: Dysphagia No date: Dysrhythmia     Comment: A-fib, now converted No date: Edema     Comment: FEET/ANKLES No date: GERD (gastroesophageal reflux disease) No date: Glaucoma No date: H/O orthostatic hypotension No date:  Hypercholesterolemia 2000: Myocardial infarction No date: [place]'s disease [place] No date: Peripheral vascular disease ([place] No date: Pneumonia     Comment: history of  No date: Polyneuropathy (HCC) No date: RLS (restless legs syndrome) No date: Seizures ([place]     Comment: when in rehab for his hip No date: Sleep apnea No date: Smoker No date: Spinal stenosis 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     Plan Discussed with: CRNA and Anesthesiologist  Anesthesia Plan Comments:         Anesthesia Quick Evaluation

## 2016-11-04 ENCOUNTER — Emergency Department
Admission: EM | Admit: 2016-11-04 | Discharge: 2016-11-04 | Disposition: A | Discharge status: Home / Self Care | Payer: Medicare (Managed Care) | Attending: Emergency Medicine | Admitting: Emergency Medicine

## 2016-11-04 ENCOUNTER — Emergency Department: Payer: Medicare (Managed Care)

## 2016-11-04 DIAGNOSIS — J441 Chronic obstructive pulmonary disease with (acute) exacerbation: Secondary | ICD-10-CM | POA: Diagnosis not present

## 2016-11-04 DIAGNOSIS — I509 Heart failure, unspecified: Secondary | ICD-10-CM | POA: Diagnosis not present

## 2016-11-04 DIAGNOSIS — E1122 Type 2 diabetes mellitus with diabetic chronic kidney disease: Secondary | ICD-10-CM | POA: Insufficient documentation

## 2016-11-04 DIAGNOSIS — R0602 Shortness of breath: Secondary | ICD-10-CM | POA: Diagnosis present

## 2016-11-04 DIAGNOSIS — I2510 Atherosclerotic heart disease of native coronary artery without angina pectoris: Secondary | ICD-10-CM | POA: Insufficient documentation

## 2016-11-04 DIAGNOSIS — N183 Chronic kidney disease, stage 3 (moderate): Secondary | ICD-10-CM | POA: Insufficient documentation

## 2016-11-04 DIAGNOSIS — Z7901 Long term (current) use of anticoagulants: Secondary | ICD-10-CM | POA: Insufficient documentation

## 2016-11-04 DIAGNOSIS — Z79899 Other long term (current) drug therapy: Secondary | ICD-10-CM | POA: Diagnosis not present

## 2016-11-04 DIAGNOSIS — Z7982 Long term (current) use of aspirin: Secondary | ICD-10-CM | POA: Diagnosis not present

## 2016-11-04 DIAGNOSIS — Z7984 Long term (current) use of oral hypoglycemic drugs: Secondary | ICD-10-CM | POA: Diagnosis not present

## 2016-11-04 DIAGNOSIS — F17210 Nicotine dependence, cigarettes, uncomplicated: Secondary | ICD-10-CM | POA: Insufficient documentation

## 2016-11-04 DIAGNOSIS — Z955 Presence of coronary angioplasty implant and graft: Secondary | ICD-10-CM | POA: Diagnosis not present

## 2016-11-04 LAB — CBC WITH DIFFERENTIAL/PLATELET
BASOS ABS: 0 10*3/uL (ref 0–0.1)
Basophils Relative: 0 %
EOS PCT: 3 %
Eosinophils Absolute: 0.3 10*3/uL (ref 0–0.7)
HEMATOCRIT: 35.9 % — AB (ref 40.0–52.0)
Hemoglobin: 12.1 g/dL — ABNORMAL LOW (ref 13.0–18.0)
LYMPHS ABS: 3.7 10*3/uL — AB (ref 1.0–3.6)
LYMPHS PCT: 40 %
MCH: 28.6 pg (ref 26.0–34.0)
MCHC: 33.6 g/dL (ref 32.0–36.0)
MCV: 85.1 fL (ref 80.0–100.0)
MONO ABS: 1 10*3/uL (ref 0.2–1.0)
Monocytes Relative: 11 %
NEUTROS ABS: 4.3 10*3/uL (ref 1.4–6.5)
Neutrophils Relative %: 46 %
PLATELETS: 222 10*3/uL (ref 150–440)
RBC: 4.22 MIL/uL — AB (ref 4.40–5.90)
RDW: 14.5 % (ref 11.5–14.5)
WBC: 9.3 10*3/uL (ref 3.8–10.6)

## 2016-11-04 LAB — BASIC METABOLIC PANEL
ANION GAP: 9 (ref 5–15)
BUN: 15 mg/dL (ref 6–20)
CO2: 25 mmol/L (ref 22–32)
Calcium: 8.4 mg/dL — ABNORMAL LOW (ref 8.9–10.3)
Chloride: 102 mmol/L (ref 101–111)
Creatinine, Ser: 0.96 mg/dL (ref 0.61–1.24)
GFR calc Af Amer: >60 mL/min (ref >60)
GFR calc non Af Amer: >60 mL/min (ref >60)
GLUCOSE: 292 mg/dL — AB (ref 65–99)
POTASSIUM: 3.7 mmol/L (ref 3.5–5.1)
Sodium: 136 mmol/L (ref 135–145)

## 2016-11-04 LAB — TROPONIN I: Troponin I: <0.03 ng/mL (ref <0.03)

## 2016-11-04 MED ORDER — AZITHROMYCIN 500 MG PO TABS
500.0000 mg | ORAL_TABLET | Freq: Once | ORAL | Status: AC
  Administered 2016-11-04: 500 mg via ORAL
  Filled 2016-11-04: qty 1

## 2016-11-04 MED ORDER — AZITHROMYCIN 250 MG PO TABS: ORAL_TABLET | ORAL | 0 refills | Status: AC

## 2016-11-04 MED ORDER — IPRATROPIUM-ALBUTEROL 0.5-2.5 (3) MG/3ML IN SOLN
9.0000 mL | Freq: Once | RESPIRATORY_TRACT | Status: AC
  Administered 2016-11-04: 9 mL via RESPIRATORY_TRACT
  Filled 2016-11-04: qty 9

## 2016-11-04 MED ORDER — METHYLPREDNISOLONE SODIUM SUCC 125 MG IJ SOLR
125.0000 mg | Freq: Once | INTRAMUSCULAR | Status: AC
Start: 2016-11-04 — End: 2016-11-04
  Administered 2016-11-04: 125 mg via INTRAVENOUS
  Filled 2016-11-04: qty 2

## 2016-11-04 MED ORDER — PREDNISONE 50 MG PO TABS: ORAL_TABLET | ORAL | 0 refills | Status: DC

## 2016-11-04 NOTE — ED Triage Notes (Signed)
Pt arrives [facility] from home for breath diff. Pt states SOB today. Skin is clammy. Pt took 2 breathing tx at home. Pt was 92% on room air, EMS placed pt on 2 L and was at 94%. EMS got pt up to 96% on 4 L nasal cannula. EMS reports decreased sounds in lower lobes.   Hx COPD, DM, MI, stent placement.   Pt doesn't want to wear oxygen d/t he doesn't want to quit smoking.

## 2016-11-04 NOTE — ED Provider Notes (Signed)
[facility] Emergency Department Provider Note  ____________________________________________   First MD Initiated Contact with Patient 11/04/16 1841     (approximate)  I have reviewed the triage vital signs and the nursing notes.   HISTORY  Chief Complaint Shortness of Breath   HPI Jimmy Hill is a 76 y.o. male with a history of COPD and coronary artery disease was present with 1 month of cough and now with worsening shortness of breath over the course of the day which acutely worsened over the past hour. EMS was called who found the patient to be 92% on room air. They needed to supplement him with 4 L of nasal cannula oxygen to get up to 96% oxygen saturation. Patient just describes some mild chest tightness when he gets short of breath but is not complaining of any pain at this time. Says he has been using albuterol at home without relief.   Past Medical History:  Diagnosis Date  . Allergic rhinitis   . Anginal pain ([place])   . Arthritis   . Autonomic dysreflexia   . Back pain    CHRONIC LBP,STENOSIS  . CHF (congestive heart failure) ([place])   . Chronic kidney disease    STAGE 3  . COPD (chronic obstructive pulmonary disease) ([place])   . Coronary artery disease   . Depression   . Diabetes ([place])   . Diabetes mellitus without complication ([place])   . DJD (degenerative joint disease)   . Dysphagia   . Dysrhythmia    A-fib, now converted  . Edema    FEET/ANKLES  . GERD (gastroesophageal reflux disease)   . Glaucoma   . H/O orthostatic hypotension   . Hypercholesterolemia   . Myocardial infarction ([place]) 2000  . Parkinson's disease ([place])   . Peripheral vascular disease ([place])   . Pneumonia    history of   . Polyneuropathy   . RLS (restless legs syndrome)   . Seizures ([place])    when in rehab for his hip  . Sleep apnea   . Smoker   . Spinal stenosis   . Strabismic amblyopia   . Tinnitus     Patient Active Problem List   Diagnosis Date  Noted  . New onset atrial fibrillation ([place]) 07/03/2016  . Loss of weight   . Gastritis without bleeding   . Occult GI bleeding   . GI bleed 03/02/2016  . Left-sided weakness 03/02/2016  . Pressure ulcer 12/29/2015  . Altered mental status 12/28/2015  . Fracture of femoral neck, left ([place]) 12/03/2015  . CAD (coronary artery disease) 12/03/2015  . Sleep apnea 12/03/2015  . Femoral neck fracture, left, closed, initial encounter 12/03/2015  . COPD (chronic obstructive pulmonary disease) ([place]) 02/05/2015  . Acute bronchitis 02/05/2015  . Type 2 diabetes mellitus ([place]) 02/05/2015  . Chest pain 12/09/2014    Past Surgical History:  Procedure Laterality Date  . ANKLE SURGERY    . APPENDECTOMY    . BACK SURGERY    . CATARACT EXTRACTION W/PHACO Left 08/26/2016   Procedure: CATARACT EXTRACTION PHACO AND INTRAOCULAR LENS PLACEMENT (IOC);  Surgeon: Birder Robson, MD;  Location: [facility] ORS;  Service: Ophthalmology;  Laterality: Left;  [place] 01:37 AP% 19.4 CDE 18.97 fluid pack lot # [id number]  . CORONARY ANGIOPLASTY     STENT  . CTR    . ESOPHAGOGASTRODUODENOSCOPY (EGD) WITH PROPOFOL N/A 03/04/2016   Procedure: ESOPHAGOGASTRODUODENOSCOPY (EGD) WITH PROPOFOL;  Surgeon: [name], MD;  Location: [facility] ENDOSCOPY;  Service: Endoscopy;  Laterality: N/A;  . EYE SURGERY     cataract  . FRACTURE SURGERY    . HIP PINNING,CANNULATED Left 12/04/2015   Procedure: CANNULATED HIP PINNING;  Surgeon: Thornton Park, MD;  Location: [facility] ORS;  Service: Orthopedics;  Laterality: Left;    Prior to Admission medications   Medication Sig Start Date End Date Taking? Authorizing Provider  acetaminophen (TYLENOL) 325 MG tablet Take 650 mg by mouth every 6 (six) hours as needed for moderate pain.    [provider]  albuterol (PROVENTIL HFA;VENTOLIN HFA) 108 (90 Base) MCG/ACT inhaler Inhale 1-2 puffs into the lungs every 4 (four) hours as needed for wheezing or shortness of breath.    [provider]  albuterol (PROVENTIL) (2.5 MG/3ML) 0.083% nebulizer solution Take 2.5 mg by nebulization every 4 (four) hours as needed for wheezing or shortness of breath.    [provider]  apixaban (ELIQUIS) 5 MG TABS tablet Take 1 tablet (5 mg total) by mouth 2 (two) times daily. 07/04/16   Bettey Costa, MD  aspirin EC 81 MG tablet Take 81 mg by mouth daily.    [provider]  atorvastatin (LIPITOR) 80 MG tablet Take 80 mg by mouth at bedtime.    [provider]  brimonidine-timolol (COMBIGAN) 0.2-0.5 % ophthalmic solution Place 1 drop into both eyes 2 (two) times daily.    [provider]  carbidopa-levodopa (SINEMET IR) 25-250 MG tablet Take 2 tablets by mouth at 8am, 1 tablet at noon, 1 tablet at 16:30, and 1 tablet at 22:00    [provider]  Carboxymethylcellul-Glycerin (REFRESH OPTIVE OP) Apply 2 drops to eye every 8 (eight) hours as needed (dry eyes).    [provider]  cetirizine (ZYRTEC) 10 MG tablet Take 10 mg by mouth at bedtime as needed for allergies.     [provider]  Dextromethorphan-Guaifenesin (TUSSIN DM) 10-100 MG/5ML liquid Take 10 mLs by mouth every 6 (six) hours as needed (cough).    [provider]  Dextrose, Diabetic Use, (GLUTOSE 15 PO) Take 1 Tube by mouth. For blood sugar less than 70, repeat in 5 minutes up to 2 doses    [provider]  diltiazem (CARDIZEM CD) 120 MG 24 hr capsule Take 1 capsule (120 mg total) by mouth daily. 07/05/16   Bettey Costa, MD  fludrocortisone (FLORINEF) 0.1 MG tablet Take 0.2 mg by mouth daily.    [provider]  fluticasone (FLONASE) 50 MCG/ACT nasal spray Place 2 sprays into both nostrils daily as needed for rhinitis.    [provider]  Fluticasone-Salmeterol (ADVAIR) 500-50 MCG/DOSE AEPB Inhale 1 puff into the lungs 2 (two) times daily.     [provider]  gabapentin (NEURONTIN) 600 MG tablet Take 600 mg by mouth daily.     [provider]  guaiFENesin (MUCINEX) 600 MG 12 hr tablet Take 600 mg by mouth every 12 (twelve) hours as needed.     [provider]  HYDROcodone-acetaminophen (NORCO/VICODIN) 5-325 MG tablet Take 1 tablet by mouth daily as needed for moderate pain.     [provider]  levETIRAcetam (KEPPRA) 500 MG tablet Take 500 mg by mouth every 12 (twelve) hours.     [provider]  loperamide (IMODIUM) 2 MG capsule Take 2 mg by mouth as needed for diarrhea or loose stools. Max 6 capsules per day    [provider]  magnesium hydroxide (MILK OF MAGNESIA) 400 MG/5ML suspension Take 15 mLs by  mouth daily as needed for mild constipation.    [provider]  metFORMIN (GLUCOPHAGE) 1000 MG tablet Take 1,000 mg by mouth 2 (two) times daily with a meal.    [provider]  naloxone (NARCAN) nasal spray 4 mg/0.1 mL Place 1 spray into the nose once. Repeat every 3 minutes as needed if no response.  Call 911 and University.    [provider]  nitroGLYCERIN (NITROSTAT) 0.4 MG SL tablet Place 0.4 mg under the tongue every 5 (five) minutes as needed for chest pain.    [provider]  Oxycodone HCl 10 MG TABS Take 10 mg by mouth every 12 (twelve) hours as needed (sciatia pain).    [provider]  ranitidine (ZANTAC) 150 MG tablet Take 150 mg by mouth at bedtime.    [provider]  sennosides-docusate sodium (SENOKOT-S) 8.6-50 MG tablet Take 1 tablet by mouth daily.     [provider]  tiotropium (SPIRIVA) 18 MCG inhalation capsule Place 18 mcg into inhaler and inhale daily. 2 PUFFS    [provider]  Vitamin D, Ergocalciferol, (DRISDOL) 50000 units CAPS capsule Take 50,000 Units by mouth every 30 (thirty) days.    [provider]    Allergies Cheese  Family History  Problem Relation Age of Onset  . Heart disease Father     Social History Social History  Substance Use Topics  . Smoking  status: Current Some Day Smoker    Packs/day: 0.50    Types: Cigarettes  . Smokeless tobacco: Never Used  . Alcohol use No    Review of Systems  Constitutional: No fever/chills Eyes: No visual changes. ENT: No sore throat. Cardiovascular: as above Respiratory: as above Gastrointestinal: No abdominal pain.  No nausea, no vomiting.  No diarrhea.  No constipation. Genitourinary: Negative for dysuria. Musculoskeletal: Negative for back pain. Skin: Negative for rash. Neurological: Negative for headaches, focal weakness or numbness.   ____________________________________________   PHYSICAL EXAM:  VITAL SIGNS: ED Triage Vitals [11/04/16 1842]  Enc Vitals Group     BP      Pulse      Resp      Temp      Temp src      SpO2      Weight 190 lb (86.2 kg)     Height 5\' 8"  (1.727 m)     Head Circumference      Peak Flow      Pain Score 0     Pain Loc      Pain Edu?      Excl. in [place]?     Constitutional: Alert and oriented. Well appearing and in no acute distress. Eyes: Conjunctivae are normal.  Head: Atraumatic. Nose: No congestion/rhinnorhea. Mouth/Throat: Mucous membranes are moist.  Neck: No stridor.   Cardiovascular: Normal rate, irregularly irregular rhythm. Grossly normal heart sounds. Respiratory: Mild tachypnea but without any overt respiratory distress. However, the patient is having a prolonged for a phase with wheezing throughout. No expiratory cough and able to speak in full sentences. Gastrointestinal: Soft and nontender. No distention. Musculoskeletal: No lower extremity tenderness nor edema.  No joint effusions. Neurologic:  Normal speech and language. No gross focal neurologic deficits are appreciated. Skin:  Skin is warm, dry and intact. No rash noted. Psychiatric: Mood and affect are normal. Speech and behavior are normal.  ____________________________________________   LABS (all labs ordered are listed, but only abnormal results are displayed)  Labs  Reviewed  CBC WITH DIFFERENTIAL/PLATELET - Abnormal; Notable for the following:       Result Value   RBC 4.22 (*)    Hemoglobin 12.1 (*)    HCT 35.9 (*)    Lymphs Abs 3.7 (*)    All other components within normal limits  BASIC METABOLIC PANEL - Abnormal; Notable for the following:    Glucose, Bld 292 (*)    Calcium 8.4 (*)    All other components within normal limits  TROPONIN I   ____________________________________________  EKG  ED ECG REPORT I, Doran Stabler, the attending physician, personally viewed and interpreted this ECG.   Date: 11/04/2016  EKG Time: 1845  Rate: 82  Rhythm: atrial fibrillation, rate 82  Axis: normal  Intervals:none  ST&T Change: No ST segment elevation or depression. No abnormal T-wave inversion.  ____________________________________________  RADIOLOGY  No acute findings on chest x-ray. ____________________________________________   PROCEDURES  Procedure(s) performed:   Procedures  Critical Care performed:   ____________________________________________   INITIAL IMPRESSION / ASSESSMENT AND PLAN / ED COURSE  Pertinent labs & imaging results that were available during my care of the patient were reviewed by me and considered in my medical decision making (see chart for details).  ----------------------------------------- 7:44 PM on 11/04/2016 -----------------------------------------  Patient at this time says he feels back to his baseline breathing status. He has 95% on room air. Only scant wheezing without any point on x-ray phase on auscultation. Patient will be started on azithromycin and will be discharged home with steroids. Patient says that his nebulizer at home. He'll follow-up in the office of his primary care doctor. Nose to return to the emergency department immediately for any worsening or concerning symptoms.     ____________________________________________   FINAL CLINICAL IMPRESSION(S) / ED DIAGNOSES  COPD  exacerbation.    NEW MEDICATIONS STARTED DURING THIS VISIT:  New Prescriptions   No medications on file     Note:  This document was prepared using Dragon voice recognition software and may include unintentional dictation errors.     Orbie Pyo, MD 11/04/16 [phone]

## 2016-12-31 ENCOUNTER — Encounter: Payer: Self-pay | Admitting: Emergency Medicine

## 2016-12-31 ENCOUNTER — Emergency Department: Payer: Medicare (Managed Care)

## 2016-12-31 ENCOUNTER — Emergency Department
Admission: EM | Admit: 2016-12-31 | Discharge: 2017-01-01 | Disposition: A | Discharge status: Home / Self Care | Payer: Medicare (Managed Care) | Attending: Emergency Medicine | Admitting: Emergency Medicine

## 2016-12-31 DIAGNOSIS — E1122 Type 2 diabetes mellitus with diabetic chronic kidney disease: Secondary | ICD-10-CM | POA: Insufficient documentation

## 2016-12-31 DIAGNOSIS — E1143 Type 2 diabetes mellitus with diabetic autonomic (poly)neuropathy: Secondary | ICD-10-CM | POA: Insufficient documentation

## 2016-12-31 DIAGNOSIS — Z7984 Long term (current) use of oral hypoglycemic drugs: Secondary | ICD-10-CM | POA: Diagnosis not present

## 2016-12-31 DIAGNOSIS — I2510 Atherosclerotic heart disease of native coronary artery without angina pectoris: Secondary | ICD-10-CM | POA: Diagnosis not present

## 2016-12-31 DIAGNOSIS — E869 Volume depletion, unspecified: Secondary | ICD-10-CM | POA: Insufficient documentation

## 2016-12-31 DIAGNOSIS — Z79899 Other long term (current) drug therapy: Secondary | ICD-10-CM | POA: Diagnosis not present

## 2016-12-31 DIAGNOSIS — F17210 Nicotine dependence, cigarettes, uncomplicated: Secondary | ICD-10-CM | POA: Diagnosis not present

## 2016-12-31 DIAGNOSIS — I130 Hypertensive heart and chronic kidney disease with heart failure and stage 1 through stage 4 chronic kidney disease, or unspecified chronic kidney disease: Secondary | ICD-10-CM | POA: Diagnosis not present

## 2016-12-31 DIAGNOSIS — I951 Orthostatic hypotension: Secondary | ICD-10-CM | POA: Diagnosis not present

## 2016-12-31 DIAGNOSIS — Z7901 Long term (current) use of anticoagulants: Secondary | ICD-10-CM | POA: Insufficient documentation

## 2016-12-31 DIAGNOSIS — J449 Chronic obstructive pulmonary disease, unspecified: Secondary | ICD-10-CM | POA: Insufficient documentation

## 2016-12-31 DIAGNOSIS — I509 Heart failure, unspecified: Secondary | ICD-10-CM | POA: Diagnosis not present

## 2016-12-31 DIAGNOSIS — N183 Chronic kidney disease, stage 3 (moderate): Secondary | ICD-10-CM | POA: Diagnosis not present

## 2016-12-31 DIAGNOSIS — E1151 Type 2 diabetes mellitus with diabetic peripheral angiopathy without gangrene: Secondary | ICD-10-CM | POA: Diagnosis not present

## 2016-12-31 DIAGNOSIS — R42 Dizziness and giddiness: Secondary | ICD-10-CM | POA: Diagnosis present

## 2016-12-31 LAB — COMPREHENSIVE METABOLIC PANEL
ALBUMIN: 3.3 g/dL — AB (ref 3.5–5.0)
ALK PHOS: 57 U/L (ref 38–126)
ALT: 12 U/L — ABNORMAL LOW (ref 17–63)
AST: 27 U/L (ref 15–41)
Anion gap: 10 (ref 5–15)
BILIRUBIN TOTAL: 0.7 mg/dL (ref 0.3–1.2)
BUN: 18 mg/dL (ref 6–20)
CALCIUM: 8.3 mg/dL — AB (ref 8.9–10.3)
CO2: 25 mmol/L (ref 22–32)
Chloride: 99 mmol/L — ABNORMAL LOW (ref 101–111)
Creatinine, Ser: 1.31 mg/dL — ABNORMAL HIGH (ref 0.61–1.24)
GFR calc Af Amer: 59 mL/min — ABNORMAL LOW (ref >60)
GFR calc non Af Amer: 51 mL/min — ABNORMAL LOW (ref >60)
GLUCOSE: 238 mg/dL — AB (ref 65–99)
POTASSIUM: 3.9 mmol/L (ref 3.5–5.1)
SODIUM: 134 mmol/L — AB (ref 135–145)
Total Protein: 6.1 g/dL — ABNORMAL LOW (ref 6.5–8.1)

## 2016-12-31 LAB — CBC
HCT: 37.4 % — ABNORMAL LOW (ref 40.0–52.0)
Hemoglobin: 12.4 g/dL — ABNORMAL LOW (ref 13.0–18.0)
MCH: 27.6 pg (ref 26.0–34.0)
MCHC: 33.1 g/dL (ref 32.0–36.0)
MCV: 83.4 fL (ref 80.0–100.0)
PLATELETS: 230 10*3/uL (ref 150–440)
RBC: 4.49 MIL/uL (ref 4.40–5.90)
RDW: 16.4 % — AB (ref 11.5–14.5)
WBC: 10.6 10*3/uL (ref 3.8–10.6)

## 2016-12-31 LAB — URINALYSIS, COMPLETE (UACMP) WITH MICROSCOPIC
Bacteria, UA: NONE SEEN
Bilirubin Urine: NEGATIVE
Glucose, UA: >=500 mg/dL — AB
Hgb urine dipstick: NEGATIVE
Ketones, ur: 5 mg/dL — AB
Nitrite: NEGATIVE
PH: 5 (ref 5.0–8.0)
Protein, ur: NEGATIVE mg/dL
SPECIFIC GRAVITY, URINE: 1.01 (ref 1.005–1.030)
Squamous Epithelial / LPF: NONE SEEN

## 2016-12-31 LAB — TROPONIN I: Troponin I: <0.03 ng/mL (ref <0.03)

## 2016-12-31 LAB — LIPASE, BLOOD: Lipase: 44 U/L (ref 11–51)

## 2016-12-31 MED ORDER — SODIUM CHLORIDE 0.9 % IV BOLUS (SEPSIS)
500.0000 mL | INTRAVENOUS | Status: AC
  Administered 2016-12-31: 500 mL via INTRAVENOUS

## 2016-12-31 NOTE — ED Notes (Signed)
Pt to ct at this time.

## 2016-12-31 NOTE — ED Notes (Signed)
Pt insisting on getting up to bathroom to bowel movement.  Pt walked to bathroom assisted by this RN and Tor Netters, RN.  Pt slow, but steady in gait with assistance.  Pt requested RN to leave room during BM.  Pt oriented to toilet side call bell.  Verbalized understanding.

## 2016-12-31 NOTE — ED Triage Notes (Signed)
Pt arrived via ems from home. PT felt weak and dizzy today after changing position and checked his blood pressure and is unsure of exact reading but remember it being low. EMS reports blood pressure of 93/63 and upon arrival pt's blood pressure is 90/59. Pt is alert and oriented x 4. Pt states he normally has low blood pressure and is put on medication to increase his blood pressure. Pt states "here lately they have had to give me IV fluids."

## 2016-12-31 NOTE — Discharge Instructions (Signed)
You have been seen today in the Emergency Department (ED)  for being lightheaded and dizzy when you stand up or change positions.  Your workup including labs and EKG show reassuring results.  Your symptoms may be due to dehydration, so it is important that you drink plenty of non-alcoholic fluids.  Please call your regular doctor as soon as possible to schedule the next available clinic appointment to follow up with him/her regarding your visit to the ED and your symptoms.  Return to the Emergency Department (ED)  if you have any further syncopal episodes (pass out again) or develop ANY chest pain, pressure, tightness, trouble breathing, sudden sweating, or other symptoms that concern you.

## 2016-12-31 NOTE — ED Provider Notes (Signed)
[facility] Emergency Department Provider Note  ____________________________________________   First MD Initiated Contact with Patient 12/31/16 1927     (approximate)  I have reviewed the triage vital signs and the nursing notes.   HISTORY  Chief Complaint Dizziness    HPI Jimmy Hill is a 76 y.o. male with extensive chronic medical issues who presents by EMS for evaluation of generalized weakness and  Lightheadedness/dizziness that seems to be related to position changes.  He states that his blood pressure is always low and he has had to have IV fluids in the past due to low blood pressure and he has actually been on medicine to make his blood pressure go up.  He has not had any falls and is not fully lost consciousness but he feels like he is going to when he gets up to walk.  He denies fever/chills, chest pain, shortness of breath, nausea, vomiting, abdominal pain, dysuria.  He appears somewhat aggravated by his symptoms and he was sleeping lightly when I checked on him but awoke easily and seems alert and oriented.  He describes symptoms as severe due to the lightheadedness and feeling like he has got pass out when he gets up.  Nothing in particular makes the patient's symptoms better nor worse.  He states that he has been eating and drinking.  [facility] is a [facility] for [facility].  He has COPD and has been admitted previously and recently for these symptoms but he denies any acute shortness of breath at this time.  Past Medical History:  Diagnosis Date  . Allergic rhinitis   . Anginal pain ([place])   . Arthritis   . Autonomic dysreflexia   . Back pain    CHRONIC LBP,STENOSIS  . CHF (congestive heart failure) ([place])   . Chronic kidney disease    STAGE 3  . COPD (chronic obstructive pulmonary disease) ([place])   . Coronary artery disease   . Depression   . Diabetes ([place])   . Diabetes mellitus without complication ([place])   . DJD  (degenerative joint disease)   . Dysphagia   . Dysrhythmia    A-fib, now converted  . Edema    FEET/ANKLES  . GERD (gastroesophageal reflux disease)   . Glaucoma   . H/O orthostatic hypotension   . Hypercholesterolemia   . Myocardial infarction ([place]) 2000  . Parkinson's disease ([place])   . Peripheral vascular disease ([place])   . Pneumonia    history of   . Polyneuropathy   . RLS (restless legs syndrome)   . Seizures ([place])    when in rehab for his hip  . Sleep apnea   . Smoker   . Spinal stenosis   . Strabismic amblyopia   . Tinnitus     Patient Active Problem List   Diagnosis Date Noted  . New onset atrial fibrillation ([place]) 07/03/2016  . Loss of weight   . Gastritis without bleeding   . Occult GI bleeding   . GI bleed 03/02/2016  . Left-sided weakness 03/02/2016  . Pressure ulcer 12/29/2015  . Altered mental status 12/28/2015  . Fracture of femoral neck, left ([place]) 12/03/2015  . CAD (coronary artery disease) 12/03/2015  . Sleep apnea 12/03/2015  . Femoral neck fracture, left, closed, initial encounter 12/03/2015  . COPD (chronic obstructive pulmonary disease) ([place]) 02/05/2015  . Acute bronchitis 02/05/2015  . Type 2 diabetes mellitus ([place]) 02/05/2015  . Chest pain 12/09/2014    Past  Surgical History:  Procedure Laterality Date  . ANKLE SURGERY    . APPENDECTOMY    . BACK SURGERY    . CATARACT EXTRACTION W/PHACO Left 08/26/2016   Procedure: CATARACT EXTRACTION PHACO AND INTRAOCULAR LENS PLACEMENT (IOC);  Surgeon: Birder Robson, MD;  Location: [facility] ORS;  Service: Ophthalmology;  Laterality: Left;  [place] 01:37 AP% 19.4 CDE 18.97 fluid pack lot # [id number]  . CORONARY ANGIOPLASTY     STENT  . CTR    . ESOPHAGOGASTRODUODENOSCOPY (EGD) WITH PROPOFOL N/A 03/04/2016   Procedure: ESOPHAGOGASTRODUODENOSCOPY (EGD) WITH PROPOFOL;  Surgeon: [name], MD;  Location: [facility] ENDOSCOPY;  Service: Endoscopy;  Laterality: N/A;  . EYE SURGERY     cataract  . FRACTURE SURGERY      . HIP PINNING,CANNULATED Left 12/04/2015   Procedure: CANNULATED HIP PINNING;  Surgeon: Thornton Park, MD;  Location: [facility] ORS;  Service: Orthopedics;  Laterality: Left;    Prior to Admission medications   Medication Sig Start Date End Date Taking? Authorizing Provider  acetaminophen (TYLENOL) 325 MG tablet Take 650 mg by mouth every 6 (six) hours as needed for moderate pain.    [provider]  albuterol (PROVENTIL HFA;VENTOLIN HFA) 108 (90 Base) MCG/ACT inhaler Inhale 1-2 puffs into the lungs every 4 (four) hours as needed for wheezing or shortness of breath.    [provider]  albuterol (PROVENTIL) (2.5 MG/3ML) 0.083% nebulizer solution Take 2.5 mg by nebulization every 4 (four) hours as needed for wheezing or shortness of breath.    [provider]  apixaban (ELIQUIS) 5 MG TABS tablet Take 1 tablet (5 mg total) by mouth 2 (two) times daily. 07/04/16   Bettey Costa, MD  aspirin EC 81 MG tablet Take 81 mg by mouth daily.    [provider]  atorvastatin (LIPITOR) 80 MG tablet Take 80 mg by mouth at bedtime.    [provider]  brimonidine-timolol (COMBIGAN) 0.2-0.5 % ophthalmic solution Place 1 drop into both eyes 2 (two) times daily.    [provider]  carbidopa-levodopa (SINEMET IR) 25-250 MG tablet Take 2 tablets by mouth at 8am, 1 tablet at noon, 1 tablet at 16:30, and 1 tablet at 22:00    [provider]  Carboxymethylcellul-Glycerin (REFRESH OPTIVE OP) Apply 2 drops to eye every 8 (eight) hours as needed (dry eyes).    [provider]  cetirizine (ZYRTEC) 10 MG tablet Take 10 mg by mouth at bedtime as needed for allergies.     [provider]  Dextromethorphan-Guaifenesin (TUSSIN DM) 10-100 MG/5ML liquid Take 10 mLs by mouth every 6 (six) hours as needed (cough).    [provider]  Dextrose, Diabetic Use, (GLUTOSE 15 PO) Take 1 Tube by mouth. For blood sugar less than 70, repeat in 5 minutes up to 2  doses    [provider]  diltiazem (CARDIZEM CD) 120 MG 24 hr capsule Take 1 capsule (120 mg total) by mouth daily. 07/05/16   Bettey Costa, MD  fludrocortisone (FLORINEF) 0.1 MG tablet Take 0.2 mg by mouth daily.    [provider]  fluticasone (FLONASE) 50 MCG/ACT nasal spray Place 2 sprays into both nostrils daily as needed for rhinitis.    [provider]  Fluticasone-Salmeterol (ADVAIR) 500-50 MCG/DOSE AEPB Inhale 1 puff into the lungs 2 (two) times daily.     [provider]  gabapentin (NEURONTIN) 600 MG tablet Take 600 mg by mouth daily.     [provider]  guaiFENesin [facility])  600 MG 12 hr tablet Take 600 mg by mouth every 12 (twelve) hours as needed.     [provider]  HYDROcodone-acetaminophen (NORCO/VICODIN) 5-325 MG tablet Take 1 tablet by mouth daily as needed for moderate pain.     [provider]  levETIRAcetam (KEPPRA) 500 MG tablet Take 500 mg by mouth every 12 (twelve) hours.     [provider]  loperamide (IMODIUM) 2 MG capsule Take 2 mg by mouth as needed for diarrhea or loose stools. Max 6 capsules per day    [provider]  magnesium hydroxide (MILK OF MAGNESIA) 400 MG/5ML suspension Take 15 mLs by mouth daily as needed for mild constipation.    [provider]  metFORMIN (GLUCOPHAGE) 1000 MG tablet Take 1,000 mg by mouth 2 (two) times daily with a meal.    [provider]  naloxone (NARCAN) nasal spray 4 mg/0.1 mL Place 1 spray into the nose once. Repeat every 3 minutes as needed if no response.  Call 911 and [place].    [provider]  nitroGLYCERIN (NITROSTAT) 0.4 MG SL tablet Place 0.4 mg under the tongue every 5 (five) minutes as needed for chest pain.    [provider]  Oxycodone HCl 10 MG TABS Take 10 mg by mouth every 12 (twelve) hours as needed (sciatia pain).    [provider]  predniSONE (DELTASONE) 50 MG tablet Take 1 tab PO daily  for 5 days 11/04/16   Orbie Pyo, MD  ranitidine (ZANTAC) 150 MG tablet Take 150 mg by mouth at bedtime.    [provider]  sennosides-docusate sodium (SENOKOT-S) 8.6-50 MG tablet Take 1 tablet by mouth daily.     [provider]  tiotropium (SPIRIVA) 18 MCG inhalation capsule Place 18 mcg into inhaler and inhale daily. 2 PUFFS    [provider]  Vitamin D, Ergocalciferol, (DRISDOL) 50000 units CAPS capsule Take 50,000 Units by mouth every 30 (thirty) days.    [provider]    Allergies Cheese  Family History  Problem Relation Age of Onset  . Heart disease Father     Social History Social History  Substance Use Topics  . Smoking status: Current Some Day Smoker    Packs/day: 0.50    Types: Cigarettes  . Smokeless tobacco: Never Used  . Alcohol use No    Review of Systems Constitutional: No fever/chills Eyes: No visual changes. ENT: No sore throat. Cardiovascular: Denies chest pain. Respiratory: Denies shortness of breath. Gastrointestinal: No abdominal pain.  No nausea, no vomiting.  No diarrhea.  No constipation. Genitourinary: Negative for dysuria. Musculoskeletal: Negative for neck pain.  Negative for back pain. Integumentary: Negative for rash. Neurological: Negative for headaches, focal weakness or numbness.   ____________________________________________   PHYSICAL EXAM:  VITAL SIGNS: ED Triage Vitals  Enc Vitals Group     BP 12/31/16 1843 (!) 90/59     Pulse Rate 12/31/16 1843 79     Resp 12/31/16 1843 16     Temp 12/31/16 1843 98.7 F (37.1 C)     Temp Source 12/31/16 1843 Oral     SpO2 12/31/16 1843 93 %     Weight 12/31/16 1842 86.2 kg (190 lb)     Height 12/31/16 1842 1.727 m (5\' 8" )     Head Circumference --      Peak Flow --      Pain Score --      Pain Loc --  Pain Edu? --      Excl. in [place]? --     Constitutional: Alert and oriented. Well appearing and in no acute distress. Eyes:  Conjunctivae are normal. He is wearing an eye patch over his left eye for what he states is chronic glaucoma Head: Atraumatic. Nose: No congestion/rhinnorhea. Mouth/Throat: Mucous membranes are moist. Neck: No stridor.  No meningeal signs.   Cardiovascular: Normal rate, regular rhythm. Good peripheral circulation. Grossly normal heart sounds. Respiratory: Normal respiratory effort.  No retractions. Lungs CTAB. Gastrointestinal: Soft and nontender. No distention.  Musculoskeletal: No lower extremity tenderness nor edema. No gross deformities of extremities. Neurologic:  Normal speech and language. No gross focal neurologic deficits are appreciated.  Skin:  Skin is warm, dry and intact. No rash noted. Psychiatric: Mood and affect are normal. Speech and behavior are normal.  ____________________________________________   LABS (all labs ordered are listed, but only abnormal results are displayed)  Labs Reviewed  CBC - Abnormal; Notable for the following:       Result Value   Hemoglobin 12.4 (*)    HCT 37.4 (*)    RDW 16.4 (*)    All other components within normal limits  URINALYSIS, COMPLETE (UACMP) WITH MICROSCOPIC - Abnormal; Notable for the following:    Color, Urine YELLOW (*)    APPearance CLEAR (*)    Glucose, UA >=500 (*)    Ketones, ur 5 (*)    Leukocytes, UA SMALL (*)    All other components within normal limits  COMPREHENSIVE METABOLIC PANEL - Abnormal; Notable for the following:    Sodium 134 (*)    Chloride 99 (*)    Glucose, Bld 238 (*)    Creatinine, Ser 1.31 (*)    Calcium 8.3 (*)    Total Protein 6.1 (*)    Albumin 3.3 (*)    ALT 12 (*)    GFR calc non Af Amer 51 (*)    GFR calc Af Amer 59 (*)    All other components within normal limits  LIPASE, BLOOD  TROPONIN I  CBG MONITORING, ED   ____________________________________________  EKG  ED ECG REPORT I, Resean Brander, the attending physician, personally viewed and interpreted this ECG.  Date:  12/31/2016 EKG Time: 18:42 Rate: 80 Rhythm: first degree AV block QRS Axis: normal Intervals: PR interval 291 ms ST/T Wave abnormalities: normal Narrative Interpretation: No evidence of acute ischemia  ____________________________________________  RADIOLOGY   Ct Head Wo Contrast  Result Date: 12/31/2016 CLINICAL DATA:  Dizziness.  Weakness. EXAM: CT HEAD WITHOUT CONTRAST TECHNIQUE: Contiguous axial images were obtained from the base of the skull through the vertex without intravenous contrast. COMPARISON:  Head CT and brain MRI October 2017 FINDINGS: Brain: Generalized atrophy, unchanged. Mild chronic small vessel ischemia, stable from prior. No intracranial hemorrhage, mass effect, or midline shift. No hydrocephalus. The basilar cisterns are patent. No evidence of territorial infarct. No extra-axial or intracranial fluid collection. Vascular: Atherosclerosis of skullbase vasculature without hyperdense vessel or abnormal calcification. Skull: No focal lesion or skull fracture. Sinuses/Orbits: Paranasal sinuses and mastoid air cells are clear. The visualized orbits are unremarkable. Other: None. IMPRESSION: 1.  No acute intracranial abnormality. 2. Stable atrophy and chronic small vessel ischemia. Electronically Signed   By: Jeb Levering M.D.   On: 12/31/2016 21:58    ____________________________________________   PROCEDURES  Critical Care performed: No   Procedure(s) performed:   Procedures   ____________________________________________   INITIAL IMPRESSION / ASSESSMENT AND PLAN / ED COURSE  Pertinent labs & imaging results that were available during my care of the patient were reviewed by me and considered in my medical decision making (see chart for details).  The patient is borderline hypotensive and I given 500 mL of normal saline.  After that his blood pressure improved to the normal range.  Given his history I will obtain a CT scan of his head but I doubt any acute  intracranial abnormalities.  I am giving another 500 mL normal saline and we will check orthostatic vital signs and try to see how he does when he stands up but I suspect that his lightheadedness is a result of volume depletion and orthostatic hypotension which seems to be corrected with IV fluids.  His lab results are all reassuring.  I will reassess after the rest of his workup is complete.   Clinical Course as of Dec 31 2349  Wed Dec 31, 2016  2334 The patient states he feels better after the IV fluids.  His blood pressure has improved and he is currently asymptomatic and able to ambulate without difficulty.  I will discharge him to follow-up with his primary care provider ([facility]) and gave him my usual and customary recommendations and return precautions.  He understands and agrees with the plan.  [CF]    Clinical Course User Index [CF] Hinda Kehr, MD    ____________________________________________  FINAL CLINICAL IMPRESSION(S) / ED DIAGNOSES  Final diagnoses:  Orthostatic hypotension  Volume depletion     MEDICATIONS GIVEN DURING THIS VISIT:  Medications  sodium chloride 0.9 % bolus 500 mL (0 mLs Intravenous Stopped 12/31/16 2123)  sodium chloride 0.9 % bolus 500 mL (500 mLs Intravenous New Bag/Given 12/31/16 2116)     NEW OUTPATIENT MEDICATIONS STARTED DURING THIS VISIT:  New Prescriptions   No medications on file    Modified Medications   No medications on file    Discontinued Medications   No medications on file     Note:  This document was prepared using Dragon voice recognition software and may include unintentional dictation errors.    Hinda Kehr, MD 12/31/16 2351

## 2017-04-30 ENCOUNTER — Emergency Department: Payer: Medicare (Managed Care)

## 2017-04-30 ENCOUNTER — Other Ambulatory Visit: Payer: Self-pay

## 2017-04-30 ENCOUNTER — Inpatient Hospital Stay
Admission: EM | Admit: 2017-04-30 | Discharge: 2017-05-06 | DRG: 871 | Disposition: A | Discharge status: Home Health | Payer: Medicare (Managed Care) | Attending: Specialist | Admitting: Specialist

## 2017-04-30 ENCOUNTER — Observation Stay
Admit: 2017-04-30 | Discharge: 2017-04-30 | Disposition: A | Discharge status: Home / Self Care | Payer: Medicare (Managed Care) | Attending: Internal Medicine | Admitting: Internal Medicine

## 2017-04-30 ENCOUNTER — Encounter: Payer: Self-pay | Admitting: Emergency Medicine

## 2017-04-30 DIAGNOSIS — S0990XA Unspecified injury of head, initial encounter: Secondary | ICD-10-CM

## 2017-04-30 DIAGNOSIS — G20 Parkinson's disease: Secondary | ICD-10-CM | POA: Diagnosis present

## 2017-04-30 DIAGNOSIS — E1122 Type 2 diabetes mellitus with diabetic chronic kidney disease: Secondary | ICD-10-CM | POA: Diagnosis present

## 2017-04-30 DIAGNOSIS — D509 Iron deficiency anemia, unspecified: Secondary | ICD-10-CM | POA: Diagnosis not present

## 2017-04-30 DIAGNOSIS — I509 Heart failure, unspecified: Secondary | ICD-10-CM | POA: Diagnosis present

## 2017-04-30 DIAGNOSIS — J440 Chronic obstructive pulmonary disease with acute lower respiratory infection: Secondary | ICD-10-CM | POA: Diagnosis present

## 2017-04-30 DIAGNOSIS — K219 Gastro-esophageal reflux disease without esophagitis: Secondary | ICD-10-CM | POA: Diagnosis present

## 2017-04-30 DIAGNOSIS — Z79899 Other long term (current) drug therapy: Secondary | ICD-10-CM

## 2017-04-30 DIAGNOSIS — E538 Deficiency of other specified B group vitamins: Secondary | ICD-10-CM | POA: Diagnosis present

## 2017-04-30 DIAGNOSIS — N183 Chronic kidney disease, stage 3 (moderate): Secondary | ICD-10-CM | POA: Diagnosis present

## 2017-04-30 DIAGNOSIS — E1151 Type 2 diabetes mellitus with diabetic peripheral angiopathy without gangrene: Secondary | ICD-10-CM | POA: Diagnosis present

## 2017-04-30 DIAGNOSIS — H409 Unspecified glaucoma: Secondary | ICD-10-CM | POA: Diagnosis present

## 2017-04-30 DIAGNOSIS — I480 Paroxysmal atrial fibrillation: Secondary | ICD-10-CM | POA: Diagnosis present

## 2017-04-30 DIAGNOSIS — F17210 Nicotine dependence, cigarettes, uncomplicated: Secondary | ICD-10-CM | POA: Diagnosis present

## 2017-04-30 DIAGNOSIS — R195 Other fecal abnormalities: Secondary | ICD-10-CM | POA: Diagnosis present

## 2017-04-30 DIAGNOSIS — Z955 Presence of coronary angioplasty implant and graft: Secondary | ICD-10-CM

## 2017-04-30 DIAGNOSIS — J9601 Acute respiratory failure with hypoxia: Secondary | ICD-10-CM | POA: Diagnosis present

## 2017-04-30 DIAGNOSIS — F329 Major depressive disorder, single episode, unspecified: Secondary | ICD-10-CM | POA: Diagnosis present

## 2017-04-30 DIAGNOSIS — K5520 Angiodysplasia of colon without hemorrhage: Secondary | ICD-10-CM | POA: Diagnosis present

## 2017-04-30 DIAGNOSIS — J189 Pneumonia, unspecified organism: Secondary | ICD-10-CM | POA: Diagnosis present

## 2017-04-30 DIAGNOSIS — G2581 Restless legs syndrome: Secondary | ICD-10-CM | POA: Diagnosis present

## 2017-04-30 DIAGNOSIS — Z7982 Long term (current) use of aspirin: Secondary | ICD-10-CM

## 2017-04-30 DIAGNOSIS — Z7984 Long term (current) use of oral hypoglycemic drugs: Secondary | ICD-10-CM

## 2017-04-30 DIAGNOSIS — E1142 Type 2 diabetes mellitus with diabetic polyneuropathy: Secondary | ICD-10-CM | POA: Diagnosis present

## 2017-04-30 DIAGNOSIS — R160 Hepatomegaly, not elsewhere classified: Secondary | ICD-10-CM | POA: Diagnosis present

## 2017-04-30 DIAGNOSIS — D649 Anemia, unspecified: Secondary | ICD-10-CM

## 2017-04-30 DIAGNOSIS — G4733 Obstructive sleep apnea (adult) (pediatric): Secondary | ICD-10-CM | POA: Diagnosis present

## 2017-04-30 DIAGNOSIS — Z9181 History of falling: Secondary | ICD-10-CM

## 2017-04-30 DIAGNOSIS — E872 Acidosis: Secondary | ICD-10-CM | POA: Diagnosis present

## 2017-04-30 DIAGNOSIS — S0101XA Laceration without foreign body of scalp, initial encounter: Secondary | ICD-10-CM | POA: Diagnosis present

## 2017-04-30 DIAGNOSIS — R6520 Severe sepsis without septic shock: Secondary | ICD-10-CM | POA: Diagnosis present

## 2017-04-30 DIAGNOSIS — E7800 Pure hypercholesterolemia, unspecified: Secondary | ICD-10-CM | POA: Diagnosis present

## 2017-04-30 DIAGNOSIS — I2510 Atherosclerotic heart disease of native coronary artery without angina pectoris: Secondary | ICD-10-CM | POA: Diagnosis present

## 2017-04-30 DIAGNOSIS — K635 Polyp of colon: Secondary | ICD-10-CM | POA: Diagnosis present

## 2017-04-30 DIAGNOSIS — W19XXXA Unspecified fall, initial encounter: Secondary | ICD-10-CM | POA: Diagnosis present

## 2017-04-30 DIAGNOSIS — R0902 Hypoxemia: Secondary | ICD-10-CM

## 2017-04-30 DIAGNOSIS — I252 Old myocardial infarction: Secondary | ICD-10-CM

## 2017-04-30 DIAGNOSIS — E876 Hypokalemia: Secondary | ICD-10-CM | POA: Diagnosis present

## 2017-04-30 DIAGNOSIS — G40909 Epilepsy, unspecified, not intractable, without status epilepticus: Secondary | ICD-10-CM | POA: Diagnosis present

## 2017-04-30 DIAGNOSIS — Z7901 Long term (current) use of anticoagulants: Secondary | ICD-10-CM

## 2017-04-30 DIAGNOSIS — R55 Syncope and collapse: Secondary | ICD-10-CM | POA: Diagnosis present

## 2017-04-30 DIAGNOSIS — A419 Sepsis, unspecified organism: Secondary | ICD-10-CM | POA: Diagnosis not present

## 2017-04-30 DIAGNOSIS — Z961 Presence of intraocular lens: Secondary | ICD-10-CM | POA: Diagnosis present

## 2017-04-30 DIAGNOSIS — Z9842 Cataract extraction status, left eye: Secondary | ICD-10-CM

## 2017-04-30 DIAGNOSIS — M1990 Unspecified osteoarthritis, unspecified site: Secondary | ICD-10-CM | POA: Diagnosis present

## 2017-04-30 LAB — MAGNESIUM: MAGNESIUM: 1.1 mg/dL — AB (ref 1.7–2.4)

## 2017-04-30 LAB — COMPREHENSIVE METABOLIC PANEL
ALT: 6 U/L — ABNORMAL LOW (ref 17–63)
ANION GAP: 10 (ref 5–15)
AST: 24 U/L (ref 15–41)
Albumin: 3 g/dL — ABNORMAL LOW (ref 3.5–5.0)
Alkaline Phosphatase: 66 U/L (ref 38–126)
BILIRUBIN TOTAL: 0.6 mg/dL (ref 0.3–1.2)
BUN: 11 mg/dL (ref 6–20)
CO2: 30 mmol/L (ref 22–32)
Calcium: 8.2 mg/dL — ABNORMAL LOW (ref 8.9–10.3)
Chloride: 100 mmol/L — ABNORMAL LOW (ref 101–111)
Creatinine, Ser: 0.81 mg/dL (ref 0.61–1.24)
Glucose, Bld: 196 mg/dL — ABNORMAL HIGH (ref 65–99)
POTASSIUM: 2.9 mmol/L — AB (ref 3.5–5.1)
Sodium: 140 mmol/L (ref 135–145)
TOTAL PROTEIN: 6.2 g/dL — AB (ref 6.5–8.1)

## 2017-04-30 LAB — CBC WITH DIFFERENTIAL/PLATELET
BASOS ABS: 0 10*3/uL (ref 0–0.1)
Basophils Relative: 0 %
EOS PCT: 2 %
Eosinophils Absolute: 0.1 10*3/uL (ref 0–0.7)
HEMATOCRIT: 29.1 % — AB (ref 40.0–52.0)
Hemoglobin: 8.8 g/dL — ABNORMAL LOW (ref 13.0–18.0)
LYMPHS PCT: 21 %
Lymphs Abs: 1.7 10*3/uL (ref 1.0–3.6)
MCH: 21 pg — ABNORMAL LOW (ref 26.0–34.0)
MCHC: 30.2 g/dL — ABNORMAL LOW (ref 32.0–36.0)
MCV: 69.6 fL — AB (ref 80.0–100.0)
Monocytes Absolute: 0.9 10*3/uL (ref 0.2–1.0)
Monocytes Relative: 11 %
NEUTROS ABS: 5.4 10*3/uL (ref 1.4–6.5)
Neutrophils Relative %: 66 %
Platelets: 257 10*3/uL (ref 150–440)
RBC: 4.18 MIL/uL — AB (ref 4.40–5.90)
RDW: 17.8 % — ABNORMAL HIGH (ref 11.5–14.5)
WBC: 8.2 10*3/uL (ref 3.8–10.6)

## 2017-04-30 LAB — GLUCOSE, CAPILLARY
Glucose-Capillary: 125 mg/dL — ABNORMAL HIGH (ref 65–99)
Glucose-Capillary: 243 mg/dL — ABNORMAL HIGH (ref 65–99)
Glucose-Capillary: 153 mg/dL — ABNORMAL HIGH (ref 65–99)

## 2017-04-30 LAB — IRON AND TIBC
IRON: 11 ug/dL — AB (ref 45–182)
Saturation Ratios: 3 % — ABNORMAL LOW (ref 17.9–39.5)
TIBC: 378 ug/dL (ref 250–450)
UIBC: 367 ug/dL

## 2017-04-30 LAB — FOLATE: Folate: 13.1 ng/mL (ref >5.9)

## 2017-04-30 LAB — HEMOGLOBIN: HEMOGLOBIN: 8.3 g/dL — AB (ref 13.0–18.0)

## 2017-04-30 LAB — PROTIME-INR
INR: 1.65
PROTHROMBIN TIME: 19.4 s — AB (ref 11.4–15.2)

## 2017-04-30 LAB — FERRITIN: FERRITIN: 5 ng/mL — AB (ref 24–336)

## 2017-04-30 LAB — TSH: TSH: 0.446 u[IU]/mL (ref 0.350–4.500)

## 2017-04-30 LAB — TROPONIN I: Troponin I: <0.03 ng/mL (ref <0.03)

## 2017-04-30 LAB — VITAMIN B12: Vitamin B-12: 172 pg/mL — ABNORMAL LOW (ref 180–914)

## 2017-04-30 LAB — ECHOCARDIOGRAM COMPLETE
Height: 68 in
Weight: 3040 oz

## 2017-04-30 MED ORDER — SODIUM CHLORIDE 0.9% FLUSH
3.0000 mL | Freq: Two times a day (BID) | INTRAVENOUS | Status: DC
  Administered 2017-04-30 – 2017-05-02 (×5): 3 mL via INTRAVENOUS

## 2017-04-30 MED ORDER — LEVETIRACETAM 500 MG PO TABS
500.0000 mg | ORAL_TABLET | Freq: Two times a day (BID) | ORAL | Status: DC
  Administered 2017-04-30 – 2017-05-06 (×13): 500 mg via ORAL
  Filled 2017-04-30 (×14): qty 1

## 2017-04-30 MED ORDER — PANTOPRAZOLE SODIUM 40 MG IV SOLR
40.0000 mg | Freq: Once | INTRAVENOUS | Status: DC
  Filled 2017-04-30: qty 40

## 2017-04-30 MED ORDER — MAGNESIUM HYDROXIDE 400 MG/5ML PO SUSP: 15.0000 mL | Freq: Every day | ORAL | Status: DC | PRN

## 2017-04-30 MED ORDER — FLUTICASONE PROPIONATE 50 MCG/ACT NA SUSP
2.0000 | Freq: Every day | NASAL | Status: DC | PRN
  Filled 2017-04-30: qty 16

## 2017-04-30 MED ORDER — ONDANSETRON HCL 4 MG/2ML IJ SOLN: 4.0000 mg | Freq: Four times a day (QID) | INTRAMUSCULAR | Status: DC | PRN

## 2017-04-30 MED ORDER — SENNOSIDES-DOCUSATE SODIUM 8.6-50 MG PO TABS
1.0000 | ORAL_TABLET | Freq: Every day | ORAL | Status: DC
  Administered 2017-04-30 – 2017-05-06 (×5): 1 via ORAL
  Filled 2017-04-30 (×7): qty 1

## 2017-04-30 MED ORDER — ONDANSETRON HCL 4 MG PO TABS: 4.0000 mg | ORAL_TABLET | Freq: Four times a day (QID) | ORAL | Status: DC | PRN

## 2017-04-30 MED ORDER — GABAPENTIN 600 MG PO TABS
600.0000 mg | ORAL_TABLET | Freq: Every day | ORAL | Status: DC
  Administered 2017-04-30 – 2017-05-06 (×7): 600 mg via ORAL
  Filled 2017-04-30 (×7): qty 1

## 2017-04-30 MED ORDER — MOMETASONE FURO-FORMOTEROL FUM 200-5 MCG/ACT IN AERO
2.0000 | INHALATION_SPRAY | Freq: Two times a day (BID) | RESPIRATORY_TRACT | Status: DC
  Administered 2017-04-30 (×2): 2 via RESPIRATORY_TRACT
  Filled 2017-04-30: qty 8.8

## 2017-04-30 MED ORDER — TETANUS-DIPHTH-ACELL PERTUSSIS 5-2.5-18.5 LF-MCG/0.5 IM SUSP: 0.5000 mL | Freq: Once | INTRAMUSCULAR | Status: DC

## 2017-04-30 MED ORDER — MAGNESIUM SULFATE 2 GM/50ML IV SOLN
2.0000 g | Freq: Once | INTRAVENOUS | Status: AC
  Administered 2017-04-30: 2 g via INTRAVENOUS
  Filled 2017-04-30: qty 50

## 2017-04-30 MED ORDER — CARBIDOPA-LEVODOPA 25-250 MG PO TABS
2.0000 | ORAL_TABLET | Freq: Every day | ORAL | Status: DC
  Administered 2017-04-30 – 2017-05-06 (×6): 2 via ORAL
  Filled 2017-04-30 (×7): qty 2

## 2017-04-30 MED ORDER — DOCUSATE SODIUM 100 MG PO CAPS
100.0000 mg | ORAL_CAPSULE | Freq: Two times a day (BID) | ORAL | Status: DC
  Administered 2017-04-30 – 2017-05-06 (×11): 100 mg via ORAL
  Filled 2017-04-30 (×13): qty 1

## 2017-04-30 MED ORDER — SODIUM CHLORIDE 0.9 % IV SOLN
INTRAVENOUS | Status: DC
  Administered 2017-04-30 (×2): via INTRAVENOUS

## 2017-04-30 MED ORDER — ATORVASTATIN CALCIUM 20 MG PO TABS
80.0000 mg | ORAL_TABLET | Freq: Every day | ORAL | Status: DC
  Administered 2017-04-30 – 2017-05-06 (×6): 80 mg via ORAL
  Filled 2017-04-30 (×6): qty 4

## 2017-04-30 MED ORDER — BRIMONIDINE TARTRATE 0.2 % OP SOLN
1.0000 [drp] | Freq: Two times a day (BID) | OPHTHALMIC | Status: DC
  Administered 2017-04-30 – 2017-05-06 (×12): 1 [drp] via OPHTHALMIC
  Filled 2017-04-30: qty 5

## 2017-04-30 MED ORDER — PEG 3350-KCL-NA BICARB-NACL 420 G PO SOLR
4000.0000 mL | Freq: Once | ORAL | Status: DC
  Filled 2017-04-30: qty 4000

## 2017-04-30 MED ORDER — FLUDROCORTISONE ACETATE 0.1 MG PO TABS
0.2000 mg | ORAL_TABLET | Freq: Every day | ORAL | Status: DC
  Administered 2017-04-30 – 2017-05-06 (×7): 0.2 mg via ORAL
  Filled 2017-04-30 (×7): qty 2

## 2017-04-30 MED ORDER — PANTOPRAZOLE SODIUM 40 MG IV SOLR
40.0000 mg | Freq: Two times a day (BID) | INTRAVENOUS | Status: DC
  Administered 2017-04-30 – 2017-05-06 (×13): 40 mg via INTRAVENOUS
  Filled 2017-04-30 (×13): qty 40

## 2017-04-30 MED ORDER — TIOTROPIUM BROMIDE MONOHYDRATE 18 MCG IN CAPS
18.0000 ug | ORAL_CAPSULE | Freq: Every day | RESPIRATORY_TRACT | Status: DC
  Administered 2017-04-30: 18 ug via RESPIRATORY_TRACT
  Filled 2017-04-30: qty 5

## 2017-04-30 MED ORDER — GUAIFENESIN-DM 100-10 MG/5ML PO SYRP
10.0000 mL | ORAL_SOLUTION | Freq: Four times a day (QID) | ORAL | Status: DC | PRN
  Administered 2017-04-30 – 2017-05-03 (×4): 10 mL via ORAL
  Filled 2017-04-30 (×5): qty 10

## 2017-04-30 MED ORDER — POTASSIUM CHLORIDE CRYS ER 20 MEQ PO TBCR
40.0000 meq | EXTENDED_RELEASE_TABLET | ORAL | Status: AC
  Administered 2017-04-30 (×2): 40 meq via ORAL
  Filled 2017-04-30 (×2): qty 2

## 2017-04-30 MED ORDER — CARBIDOPA-LEVODOPA 25-250 MG PO TABS
1.0000 | ORAL_TABLET | Freq: Three times a day (TID) | ORAL | Status: DC
  Administered 2017-04-30 – 2017-05-06 (×18): 1 via ORAL
  Filled 2017-04-30 (×21): qty 1

## 2017-04-30 MED ORDER — NITROGLYCERIN 0.4 MG SL SUBL: 0.4000 mg | SUBLINGUAL_TABLET | SUBLINGUAL | Status: DC | PRN

## 2017-04-30 MED ORDER — LORATADINE 10 MG PO TABS: 10.0000 mg | ORAL_TABLET | Freq: Every day | ORAL | Status: DC | PRN

## 2017-04-30 MED ORDER — DILTIAZEM HCL ER COATED BEADS 120 MG PO CP24
120.0000 mg | ORAL_CAPSULE | Freq: Every day | ORAL | Status: DC
  Administered 2017-04-30 – 2017-05-01 (×2): 120 mg via ORAL
  Filled 2017-04-30 (×2): qty 1

## 2017-04-30 MED ORDER — ACETAMINOPHEN 650 MG RE SUPP: 650.0000 mg | Freq: Four times a day (QID) | RECTAL | Status: DC | PRN

## 2017-04-30 MED ORDER — INSULIN ASPART 100 UNIT/ML ~~LOC~~ SOLN
0.0000 [IU] | Freq: Three times a day (TID) | SUBCUTANEOUS | Status: DC
  Administered 2017-04-30: 1 [IU] via SUBCUTANEOUS
  Administered 2017-04-30: 3 [IU] via SUBCUTANEOUS
  Administered 2017-05-01: 1 [IU] via SUBCUTANEOUS
  Administered 2017-05-02: 2 [IU] via SUBCUTANEOUS
  Administered 2017-05-02: 5 [IU] via SUBCUTANEOUS
  Administered 2017-05-02: 3 [IU] via SUBCUTANEOUS
  Filled 2017-04-30 (×7): qty 1

## 2017-04-30 MED ORDER — INSULIN ASPART 100 UNIT/ML ~~LOC~~ SOLN
0.0000 [IU] | Freq: Every day | SUBCUTANEOUS | Status: DC
  Filled 2017-04-30: qty 1

## 2017-04-30 MED ORDER — TIMOLOL MALEATE 0.5 % OP SOLN
1.0000 [drp] | Freq: Two times a day (BID) | OPHTHALMIC | Status: DC
  Administered 2017-04-30 – 2017-05-06 (×12): 1 [drp] via OPHTHALMIC
  Filled 2017-04-30: qty 5

## 2017-04-30 MED ORDER — SODIUM CHLORIDE 0.9 % IV SOLN
80.0000 mg | Freq: Once | INTRAVENOUS | Status: AC
  Administered 2017-04-30: 80 mg via INTRAVENOUS
  Filled 2017-04-30: qty 80

## 2017-04-30 MED ORDER — ACETAMINOPHEN 325 MG PO TABS
650.0000 mg | ORAL_TABLET | Freq: Four times a day (QID) | ORAL | Status: DC | PRN
  Filled 2017-04-30: qty 2

## 2017-04-30 MED ORDER — ACETAMINOPHEN 325 MG PO TABS: 650.0000 mg | ORAL_TABLET | Freq: Four times a day (QID) | ORAL | Status: DC | PRN

## 2017-04-30 MED ORDER — ACETAMINOPHEN 500 MG PO TABS
1000.0000 mg | ORAL_TABLET | Freq: Once | ORAL | Status: AC
  Administered 2017-04-30: 1000 mg via ORAL
  Filled 2017-04-30: qty 2

## 2017-04-30 MED ORDER — LOPERAMIDE HCL 2 MG PO CAPS: 2.0000 mg | ORAL_CAPSULE | ORAL | Status: DC | PRN

## 2017-04-30 MED ORDER — HYDROCODONE-ACETAMINOPHEN 5-325 MG PO TABS
1.0000 | ORAL_TABLET | Freq: Every day | ORAL | Status: DC | PRN
  Administered 2017-04-30 (×2): 1 via ORAL
  Filled 2017-04-30 (×3): qty 1

## 2017-04-30 MED ORDER — BRIMONIDINE TARTRATE-TIMOLOL 0.2-0.5 % OP SOLN
1.0000 [drp] | Freq: Two times a day (BID) | OPHTHALMIC | Status: DC
  Filled 2017-04-30: qty 5

## 2017-04-30 MED ORDER — ALBUTEROL SULFATE (2.5 MG/3ML) 0.083% IN NEBU
2.5000 mg | INHALATION_SOLUTION | RESPIRATORY_TRACT | Status: DC | PRN
  Administered 2017-05-01: 2.5 mg via RESPIRATORY_TRACT
  Filled 2017-04-30: qty 3

## 2017-04-30 MED ORDER — OXYCODONE HCL 5 MG PO TABS
10.0000 mg | ORAL_TABLET | Freq: Two times a day (BID) | ORAL | Status: DC | PRN
  Administered 2017-05-01 – 2017-05-05 (×3): 10 mg via ORAL
  Filled 2017-04-30 (×3): qty 2

## 2017-04-30 NOTE — Clinical Social Work Note (Signed)
CSW received referral for SNF.  Case discussed with case manager and plan is to discharge home with home health and have [facility] continue to follow.  CSW to sign off please re-consult if social work needs arise.  Jones Broom. Lee [place], MSW, [place]

## 2017-04-30 NOTE — H&P (Signed)
Jimmy Hill is an 76 y.o. male.   Chief Complaint: Fall HPI: The patient with past medical history of coronary artery disease status post MI with stent placement, Parkinson's disease, COPD and diabetes presents to the emergency department after fainting.  The patient states that he awoke to use the restroom.  He made his way to the commode and leaned over to him remove his pants when he briefly lost consciousness.  He awoke on the bathroom floor bleeding from his head.  He called 911 and was conscious upon EMS arrival.  CT scan in the emergency department showed no acute intracranial process.  He had suffered a laceration to his forehead which was closed with Steri-Strips.  Laboratory evaluation revealed anemia and guaiac positive stool.  Thus the emergency department staff called the hospitalist service for admission and further evaluation.  Past Medical History:  Diagnosis Date  . Allergic rhinitis   . Anginal pain ([place])   . Arthritis   . Autonomic dysreflexia   . Back pain    CHRONIC LBP,STENOSIS  . CHF (congestive heart failure) ([place])   . Chronic kidney disease    STAGE 3  . COPD (chronic obstructive pulmonary disease) ([place])   . Coronary artery disease   . Depression   . Diabetes ([place])   . Diabetes mellitus without complication ([place])   . DJD (degenerative joint disease)   . Dysphagia   . Dysrhythmia    A-fib, now converted  . Edema    FEET/ANKLES  . GERD (gastroesophageal reflux disease)   . Glaucoma   . H/O orthostatic hypotension   . Hypercholesterolemia   . Myocardial infarction ([employer]) 2000  . Parkinson's disease ([place])   . Peripheral vascular disease ([place])   . Pneumonia    history of   . Polyneuropathy   . RLS (restless legs syndrome)   . Seizures ([place])    when in rehab for his hip  . Sleep apnea   . Smoker   . Spinal stenosis   . Strabismic amblyopia   . Tinnitus     Past Surgical History:  Procedure Laterality Date  . ANKLE SURGERY    . APPENDECTOMY    .  BACK SURGERY    . CATARACT EXTRACTION W/PHACO Left 08/26/2016   Procedure: CATARACT EXTRACTION PHACO AND INTRAOCULAR LENS PLACEMENT (IOC);  Surgeon: Birder Robson, MD;  Location: [facility] ORS;  Service: Ophthalmology;  Laterality: Left;  [place] 01:37 AP% 19.4 CDE 18.97 fluid pack lot # [id number]  . CORONARY ANGIOPLASTY     STENT  . CTR    . ESOPHAGOGASTRODUODENOSCOPY (EGD) WITH PROPOFOL N/A 03/04/2016   Procedure: ESOPHAGOGASTRODUODENOSCOPY (EGD) WITH PROPOFOL;  Surgeon: [name], MD;  Location: [facility] ENDOSCOPY;  Service: Endoscopy;  Laterality: N/A;  . EYE SURGERY     cataract  . FRACTURE SURGERY    . HIP PINNING,CANNULATED Left 12/04/2015   Procedure: CANNULATED HIP PINNING;  Surgeon: Thornton Park, MD;  Location: [facility] ORS;  Service: Orthopedics;  Laterality: Left;    Family History  Problem Relation Age of Onset  . Heart disease Father   . Diabetes Mellitus II Other    Social History:  reports that he has been smoking cigarettes.  He has been smoking about 0.50 packs per day. he has never used smokeless tobacco. He reports that he does not drink alcohol or use drugs.  Allergies:  Allergies  Allergen Reactions  . Cheese     Patient doesn't want any cheese    Prior to  Admission medications   Medication Sig Start Date End Date Taking? Authorizing Provider  acetaminophen (TYLENOL) 325 MG tablet Take 650 mg by mouth every 6 (six) hours as needed for moderate pain.    [provider]  albuterol (PROVENTIL HFA;VENTOLIN HFA) 108 (90 Base) MCG/ACT inhaler Inhale 1-2 puffs into the lungs every 4 (four) hours as needed for wheezing or shortness of breath.    [provider]  albuterol (PROVENTIL) (2.5 MG/3ML) 0.083% nebulizer solution Take 2.5 mg by nebulization every 4 (four) hours as needed for wheezing or shortness of breath.    [provider]  apixaban (ELIQUIS) 5 MG TABS tablet Take 1 tablet (5 mg total) by mouth 2 (two) times daily. 07/04/16   Bettey Costa, MD   aspirin EC 81 MG tablet Take 81 mg by mouth daily.    [provider]  atorvastatin (LIPITOR) 80 MG tablet Take 80 mg by mouth at bedtime.    [provider]  brimonidine-timolol (COMBIGAN) 0.2-0.5 % ophthalmic solution Place 1 drop into both eyes 2 (two) times daily.    [provider]  carbidopa-levodopa (SINEMET IR) 25-250 MG tablet Take 2 tablets by mouth at 8am, 1 tablet at noon, 1 tablet at 16:30, and 1 tablet at 22:00    [provider]  Carboxymethylcellul-Glycerin (REFRESH OPTIVE OP) Apply 2 drops to eye every 8 (eight) hours as needed (dry eyes).    [provider]  cetirizine (ZYRTEC) 10 MG tablet Take 10 mg by mouth at bedtime as needed for allergies.     [provider]  Dextromethorphan-Guaifenesin (TUSSIN DM) 10-100 MG/5ML liquid Take 10 mLs by mouth every 6 (six) hours as needed (cough).    [provider]  Dextrose, Diabetic Use, (GLUTOSE 15 PO) Take 1 Tube by mouth. For blood sugar less than 70, repeat in 5 minutes up to 2 doses    [provider]  diltiazem (CARDIZEM CD) 120 MG 24 hr capsule Take 1 capsule (120 mg total) by mouth daily. 07/05/16   Bettey Costa, MD  fludrocortisone (FLORINEF) 0.1 MG tablet Take 0.2 mg by mouth daily.    [provider]  fluticasone (FLONASE) 50 MCG/ACT nasal spray Place 2 sprays into both nostrils daily as needed for rhinitis.    [provider]  Fluticasone-Salmeterol (ADVAIR) 500-50 MCG/DOSE AEPB Inhale 1 puff into the lungs 2 (two) times daily.     [provider]  gabapentin (NEURONTIN) 600 MG tablet Take 600 mg by mouth daily.     [provider]  guaiFENesin (MUCINEX) 600 MG 12 hr tablet Take 600 mg by mouth every 12 (twelve) hours as needed.     [provider]  HYDROcodone-acetaminophen (NORCO/VICODIN) 5-325 MG tablet Take 1 tablet by mouth daily as needed for moderate pain.     [provider]  levETIRAcetam (KEPPRA)  500 MG tablet Take 500 mg by mouth every 12 (twelve) hours.     [provider]  loperamide (IMODIUM) 2 MG capsule Take 2 mg by mouth as needed for diarrhea or loose stools. Max 6 capsules per day    [provider]  magnesium hydroxide (MILK OF MAGNESIA) 400 MG/5ML suspension Take 15 mLs by mouth daily as needed for mild constipation.    [provider]  metFORMIN (GLUCOPHAGE) 1000 MG tablet Take 1,000 mg by mouth 2 (two) times daily with a meal.    [provider]  naloxone (NARCAN) nasal spray 4 mg/0.1 mL Place 1 spray into  the nose once. Repeat every 3 minutes as needed if no response.  Call 911 and [place].    [provider]  nitroGLYCERIN (NITROSTAT) 0.4 MG SL tablet Place 0.4 mg under the tongue every 5 (five) minutes as needed for chest pain.    [provider]  Oxycodone HCl 10 MG TABS Take 10 mg by mouth every 12 (twelve) hours as needed (sciatia pain).    [provider]  predniSONE (DELTASONE) 50 MG tablet Take 1 tab PO daily for 5 days 11/04/16   Orbie Pyo, MD  ranitidine (ZANTAC) 150 MG tablet Take 150 mg by mouth at bedtime.    [provider]  sennosides-docusate sodium (SENOKOT-S) 8.6-50 MG tablet Take 1 tablet by mouth daily.     [provider]  tiotropium (SPIRIVA) 18 MCG inhalation capsule Place 18 mcg into inhaler and inhale daily. 2 PUFFS    [provider]  Vitamin D, Ergocalciferol, (DRISDOL) 50000 units CAPS capsule Take 50,000 Units by mouth every 30 (thirty) days.    [provider]     Results for orders placed or performed during the hospital encounter of 04/30/17 (from the past 48 hour(s))  Comprehensive metabolic panel     Status: Abnormal   Collection Time: 04/30/17  4:17 AM  Result Value Ref Range   Sodium 140 135 - 145 mmol/L   Potassium 2.9 (L) 3.5 - 5.1 mmol/L   Chloride 100 (L) 101 - 111 mmol/L   CO2 30 22 - 32 mmol/L   Glucose, Bld 196 (H)  65 - 99 mg/dL   BUN 11 6 - 20 mg/dL   Creatinine, Ser 0.81 0.61 - 1.24 mg/dL   Calcium 8.2 (L) 8.9 - 10.3 mg/dL   Total Protein 6.2 (L) 6.5 - 8.1 g/dL   Albumin 3.0 (L) 3.5 - 5.0 g/dL   AST 24 15 - 41 U/L   ALT 6 (L) 17 - 63 U/L   Alkaline Phosphatase 66 38 - 126 U/L   Total Bilirubin 0.6 0.3 - 1.2 mg/dL   GFR calc non Af Amer >60 >60 mL/min   GFR calc Af Amer >60 >60 mL/min    Comment: (NOTE) The eGFR has been calculated using the CKD EPI equation. This calculation has not been validated in all clinical situations. eGFR's persistently <60 mL/min signify possible Chronic Kidney Disease.    Anion gap 10 5 - 15  CBC with Differential     Status: Abnormal   Collection Time: 04/30/17  4:17 AM  Result Value Ref Range   WBC 8.2 3.8 - 10.6 K/uL   RBC 4.18 (L) 4.40 - 5.90 MIL/uL   Hemoglobin 8.8 (L) 13.0 - 18.0 g/dL   HCT 29.1 (L) 40.0 - 52.0 %   MCV 69.6 (L) 80.0 - 100.0 fL   MCH 21.0 (L) 26.0 - 34.0 pg   MCHC 30.2 (L) 32.0 - 36.0 g/dL   RDW 17.8 (H) 11.5 - 14.5 %   Platelets 257 150 - 440 K/uL   Neutrophils Relative % 66 %   Neutro Abs 5.4 1.4 - 6.5 K/uL   Lymphocytes Relative 21 %   Lymphs Abs 1.7 1.0 - 3.6 K/uL   Monocytes Relative 11 %   Monocytes Absolute 0.9 0.2 - 1.0 K/uL   Eosinophils Relative 2 %   Eosinophils Absolute 0.1 0 - 0.7 K/uL   Basophils Relative 0 %   Basophils Absolute 0.0 0 - 0.1 K/uL  Troponin I  Status: None   Collection Time: 04/30/17  4:17 AM  Result Value Ref Range   Troponin I <0.03 <0.03 ng/mL  Protime-INR     Status: Abnormal   Collection Time: 04/30/17  4:22 AM  Result Value Ref Range   Prothrombin Time 19.4 (H) 11.4 - 15.2 seconds   INR 1.65    Dg Chest 2 View  Result Date: 04/30/2017 CLINICAL DATA:  Status post fall, with concern for chest injury. EXAM: CHEST  2 VIEW COMPARISON:  Chest radiograph performed 11/04/2016 FINDINGS: The lungs are well-aerated and clear. There is no evidence of focal opacification, pleural effusion or  pneumothorax. The heart is normal in size; the mediastinal contour is within normal limits. No acute osseous abnormalities are seen. IMPRESSION: No acute cardiopulmonary process seen. No displaced rib fractures identified. Electronically Signed   By: Garald Balding M.D.   On: 04/30/2017 04:42   Ct Head Wo Contrast  Result Date: 04/30/2017 CLINICAL DATA:  Status post fall, with frontal scalp hematoma, headache and left-sided neck pain. Patient on blood thinner. EXAM: CT HEAD WITHOUT CONTRAST CT CERVICAL SPINE WITHOUT CONTRAST TECHNIQUE: Multidetector CT imaging of the head and cervical spine was performed following the standard protocol without intravenous contrast. Multiplanar CT image reconstructions of the cervical spine were also generated. COMPARISON:  CT of the head performed 12/31/2016, and MRI/MRA of the brain and MRI of the cervical spine performed 03/03/2016 FINDINGS: CT HEAD FINDINGS Brain: No evidence of acute infarction, hemorrhage, hydrocephalus, extra-axial collection or mass lesion/mass effect. Prominence of the ventricles and sulci reflects moderate cortical volume loss. Cerebellar atrophy is noted. Scattered periventricular and subcortical white matter change likely reflects small vessel ischemic microangiopathy. The brainstem and fourth ventricle are within normal limits. The basal ganglia are unremarkable in appearance. The cerebral hemispheres demonstrate grossly normal gray-white differentiation. No mass effect or midline shift is seen. Vascular: No hyperdense vessel or unexpected calcification. Skull: There is no evidence of fracture; visualized osseous structures are unremarkable in appearance. Sinuses/Orbits: The visualized portions of the orbits are within normal limits. The paranasal sinuses and mastoid air cells are well-aerated. Other: Mild soft tissue swelling is noted overlying the left frontal calvarium. CT CERVICAL SPINE FINDINGS Alignment: Normal. Skull base and vertebrae: No  acute fracture. No primary bone lesion or focal pathologic process. There is incomplete fusion of the posterior arch of C1. Soft tissues and spinal canal: No prevertebral fluid or swelling. No visible canal hematoma. Disc levels: Multilevel disc space narrowing is noted along the cervical spine, with scattered anterior and posterior disc osteophyte complexes, and underlying facet disease. Mild degenerative change is noted about the dens. Upper chest: The visualized lung apices are clear. The visualized portions of the thyroid gland are unremarkable. Scattered calcification is noted at the carotid bifurcations bilaterally. Other: No additional soft tissue abnormalities are seen. IMPRESSION: 1. No evidence of traumatic intracranial injury or fracture. 2. No evidence of fracture or subluxation along the cervical spine. 3. Mild soft tissue swelling overlying the left frontal calvarium. 4. Moderate cortical volume loss and scattered small vessel ischemic microangiopathy. 5. Mild degenerative change along the cervical spine. 6. Scattered calcification at the carotid bifurcations bilaterally. Carotid ultrasound is recommended for further evaluation, when and as deemed clinically appropriate. Electronically Signed   By: Garald Balding M.D.   On: 04/30/2017 04:40   Ct Cervical Spine Wo Contrast  Result Date: 04/30/2017 CLINICAL DATA:  Status post fall, with frontal scalp hematoma, headache and left-sided neck pain. Patient on blood  thinner. EXAM: CT HEAD WITHOUT CONTRAST CT CERVICAL SPINE WITHOUT CONTRAST TECHNIQUE: Multidetector CT imaging of the head and cervical spine was performed following the standard protocol without intravenous contrast. Multiplanar CT image reconstructions of the cervical spine were also generated. COMPARISON:  CT of the head performed 12/31/2016, and MRI/MRA of the brain and MRI of the cervical spine performed 03/03/2016 FINDINGS: CT HEAD FINDINGS Brain: No evidence of acute infarction,  hemorrhage, hydrocephalus, extra-axial collection or mass lesion/mass effect. Prominence of the ventricles and sulci reflects moderate cortical volume loss. Cerebellar atrophy is noted. Scattered periventricular and subcortical white matter change likely reflects small vessel ischemic microangiopathy. The brainstem and fourth ventricle are within normal limits. The basal ganglia are unremarkable in appearance. The cerebral hemispheres demonstrate grossly normal gray-white differentiation. No mass effect or midline shift is seen. Vascular: No hyperdense vessel or unexpected calcification. Skull: There is no evidence of fracture; visualized osseous structures are unremarkable in appearance. Sinuses/Orbits: The visualized portions of the orbits are within normal limits. The paranasal sinuses and mastoid air cells are well-aerated. Other: Mild soft tissue swelling is noted overlying the left frontal calvarium. CT CERVICAL SPINE FINDINGS Alignment: Normal. Skull base and vertebrae: No acute fracture. No primary bone lesion or focal pathologic process. There is incomplete fusion of the posterior arch of C1. Soft tissues and spinal canal: No prevertebral fluid or swelling. No visible canal hematoma. Disc levels: Multilevel disc space narrowing is noted along the cervical spine, with scattered anterior and posterior disc osteophyte complexes, and underlying facet disease. Mild degenerative change is noted about the dens. Upper chest: The visualized lung apices are clear. The visualized portions of the thyroid gland are unremarkable. Scattered calcification is noted at the carotid bifurcations bilaterally. Other: No additional soft tissue abnormalities are seen. IMPRESSION: 1. No evidence of traumatic intracranial injury or fracture. 2. No evidence of fracture or subluxation along the cervical spine. 3. Mild soft tissue swelling overlying the left frontal calvarium. 4. Moderate cortical volume loss and scattered small vessel  ischemic microangiopathy. 5. Mild degenerative change along the cervical spine. 6. Scattered calcification at the carotid bifurcations bilaterally. Carotid ultrasound is recommended for further evaluation, when and as deemed clinically appropriate. Electronically Signed   By: Garald Balding M.D.   On: 04/30/2017 04:40    Review of Systems  Constitutional: Negative for chills and fever.  HENT: Negative for sore throat and tinnitus.   Eyes: Negative for blurred vision and redness.  Respiratory: Negative for cough and shortness of breath.   Cardiovascular: Negative for chest pain, palpitations, orthopnea and PND.  Gastrointestinal: Negative for abdominal pain, diarrhea, nausea and vomiting.  Genitourinary: Negative for dysuria, frequency and urgency.  Musculoskeletal: Positive for falls. Negative for joint pain and myalgias.  Skin: Negative for rash.       No lesions  Neurological: Positive for loss of consciousness. Negative for speech change, focal weakness and weakness.  Endo/Heme/Allergies: Does not bruise/bleed easily.       No temperature intolerance  Psychiatric/Behavioral: Negative for depression and suicidal ideas.    Blood pressure (!) 104/50, pulse 81, temperature 98.5 F (36.9 C), temperature source Oral, resp. rate 17, height '5\' 8"'  (1.727 m), weight 86.2 kg (190 lb), SpO2 94 %. Physical Exam  Constitutional: He is oriented to person, place, and time. He appears well-developed and well-nourished. No distress.  HENT:  Head: Normocephalic and atraumatic.  Mouth/Throat: Oropharynx is clear and moist.  Eyes: EOM are normal. Pupils are equal, round, and reactive to light.  No scleral icterus.  Neck: Normal range of motion. Neck supple. No JVD present. No tracheal deviation present. No thyromegaly present.  Cardiovascular: Normal rate, regular rhythm and normal heart sounds. Exam reveals no gallop and no friction rub.  No murmur heard. Respiratory: Effort normal. He has wheezes.   GI: Soft. Bowel sounds are normal. He exhibits no distension. There is no tenderness.  Genitourinary:  Genitourinary Comments: Deferred  Musculoskeletal: Normal range of motion. He exhibits no edema.  Lymphadenopathy:    He has no cervical adenopathy.  Neurological: He is alert and oriented to person, place, and time. No cranial nerve deficit.  Skin: Skin is warm and dry. No rash noted. No erythema.  Psychiatric: He has a normal mood and affect. His behavior is normal. Judgment and thought content normal.     Assessment/Plan This is a 76 year old male admitted for syncope. 1.  Syncope: Multifactorial; etiologies include symptomatic anemia, Parkinson's disease and/or arrhythmia.  The patient has history of falls as well as atrial fibrillation.  He has been on anticoagulation and is Hemoccult positive today.  He underwent colonoscopy 2 weeks ago at [facility] which showed some bleeding polyps.  He denies palpitations or chest pain.  We will consult cardiology and obtain echocardiogram to rule out cardiac etiology.  Hydrate with intravenous fluid. 2.  Anemia: I have held the patient's Eliquis and aspirin.  Consult gastroenterology at the discretion of the primary team. 3.  Coronary artery disease: Stable; troponin negative and no EKG evidence of ischemia. 4.  Parkinson's disease: Makes patient more prone to falls.  Continue Sinemet 5.  Atrial fibrillation: Rate controlled; continue diltiazem. 6.  Diabetes mellitus type 2: Hold metformin.  Sliding scale insulin while hospitalized 7.  Hyperlipidemia: Continue statin therapy 8.  Seizure disorder: Continue Keppra 9.  COPD: Continue inhaled corticosteroid as well as Spiriva.  Albuterol as needed. 10.  DVT prophylaxis: SCDs 11.  GI prophylaxis: Continue PPI per home regimen The patient is a full code.  Time spent on admission orders and patient care approximately 45 minutes   Harrie Foreman, MD 04/30/2017, 7:12 AM

## 2017-04-30 NOTE — Consult Note (Signed)
[facility] Consultation Note  Patient ID: Jimmy Hill, MRN: [id number], DOB/AGE: [birth year] 76 y.o. Admit date: 04/30/2017   Date of Consult: 04/30/2017 Primary Physician: [facility], [facility] Primary Cardiologist: None  Chief Complaint:  Chief Complaint  Patient presents with  . Fall   Reason for Consult: Coronary artery disease atrial fibrillation with syncope  HPI: 76 y.o. male with known apparent coronary artery disease status post multiple PCI and stent placements last with a right coronary artery stent in 2017 and a patent obtuse marginal LAD stent.  The patient previously has had a myocardial infarction in the past and recently has done relatively well on appropriate medication management.  The patient additionally has had apparent previous history of paroxysmal nonvalvular atrial fibrillation for which she has been on anticoagulation.  Patient has been battling anemia for multiple years for which the patient has a hemoglobin of 8.8 at this time.  The patient also has COPD D and diabetes and is somewhat decreased exercise tolerance.  The patient has had known issues with Parkinson's for which the patient has a high fall risk.  In the past he has had episodes for which he has had some falling and a weakness but recently awakened from sleep and was going to go to the bathroom.  When he leaned over to pull his pants down the fell and had a laceration of his forehead.  At that time the patient was down for some time until EMS arrived.  There is been no evidence of significant new rhythm disturbances congestive heart failure or myocardial infarction at this time and his syncopal episode appears mainly due to anemia, hypotension, Parkinson's, and vasovagal component as well.  There is no evidence of new cardiac issues at this time  Past Medical History:  Diagnosis Date  . Allergic rhinitis   . Anginal pain ([place])   . Arthritis   . Autonomic  dysreflexia   . Back pain    CHRONIC LBP,STENOSIS  . CHF (congestive heart failure) ([place])   . Chronic kidney disease    STAGE 3  . COPD (chronic obstructive pulmonary disease) ([place])   . Coronary artery disease   . Depression   . Diabetes ([place])   . Diabetes mellitus without complication ([place])   . DJD (degenerative joint disease)   . Dysphagia   . Dysrhythmia    A-fib, now converted  . Edema    FEET/ANKLES  . GERD (gastroesophageal reflux disease)   . Glaucoma   . H/O orthostatic hypotension   . Hypercholesterolemia   . Myocardial infarction ([place]) 2000  . Parkinson's disease ([place])   . Peripheral vascular disease ([name])   . Pneumonia    history of   . Polyneuropathy   . RLS (restless legs syndrome)   . Seizures ([place])    when in rehab for his hip  . Sleep apnea   . Smoker   . Spinal stenosis   . Strabismic amblyopia   . Tinnitus       Surgical History:  Past Surgical History:  Procedure Laterality Date  . ANKLE SURGERY    . APPENDECTOMY    . BACK SURGERY    . CATARACT EXTRACTION W/PHACO Left 08/26/2016   Procedure: CATARACT EXTRACTION PHACO AND INTRAOCULAR LENS PLACEMENT (IOC);  Surgeon: Birder Robson, MD;  Location: [facility] ORS;  Service: Ophthalmology;  Laterality: Left;  [place] 01:37 AP% 19.4 CDE 18.97 fluid pack lot # [id number]  . CORONARY ANGIOPLASTY  STENT  . CTR    . ESOPHAGOGASTRODUODENOSCOPY (EGD) WITH PROPOFOL N/A 03/04/2016   Procedure: ESOPHAGOGASTRODUODENOSCOPY (EGD) WITH PROPOFOL;  Surgeon: [name], MD;  Location: [facility] ENDOSCOPY;  Service: Endoscopy;  Laterality: N/A;  . EYE SURGERY     cataract  . FRACTURE SURGERY    . HIP PINNING,CANNULATED Left 12/04/2015   Procedure: CANNULATED HIP PINNING;  Surgeon: Thornton Park, MD;  Location: [facility] ORS;  Service: Orthopedics;  Laterality: Left;     Home Meds: Prior to Admission medications   Medication Sig Start Date End Date Taking? Authorizing Provider  apixaban (ELIQUIS) 5 MG TABS tablet Take 1  tablet (5 mg total) by mouth 2 (two) times daily. 07/04/16  Yes Bettey Costa, MD  aspirin EC 81 MG tablet Take 81 mg by mouth daily.   Yes [provider]  atorvastatin (LIPITOR) 80 MG tablet Take 80 mg by mouth at bedtime.   Yes [provider]  brimonidine-timolol (COMBIGAN) 0.2-0.5 % ophthalmic solution Place 1 drop into both eyes 2 (two) times daily.   Yes [provider]  carbidopa-levodopa (SINEMET IR) 25-250 MG tablet Take 1 tablet by mouth 4 (four) times daily.    Yes [provider]  citalopram (CELEXA) 20 MG tablet Take 20 mg by mouth daily.   Yes [provider]  Difluprednate 0.05 % EMUL Apply 1 drop to eye 2 (two) times daily.   Yes [provider]  diltiazem (CARDIZEM CD) 120 MG 24 hr capsule Take 1 capsule (120 mg total) by mouth daily. Patient taking differently: Take 120 mg by mouth 4 (four) times daily.  07/05/16  Yes Mody, Ulice Bold, MD  diltiazem (CARDIZEM) 120 MG tablet Take 120 mg by mouth 4 (four) times daily.   Yes [provider]  fludrocortisone (FLORINEF) 0.1 MG tablet Take 0.1 mg by mouth daily.    Yes [provider]  gabapentin (NEURONTIN) 600 MG tablet Take 600 mg by mouth 3 (three) times daily.    Yes [provider]  insulin glargine (LANTUS) 100 UNIT/ML injection Inject 20 Units into the skin daily.   Yes [provider]  levETIRAcetam (KEPPRA) 500 MG tablet Take 500 mg by mouth every 12 (twelve) hours.    Yes [provider]  lidocaine (XYLOCAINE) 5 % ointment Apply 1 application topically as needed.   Yes [provider]  metFORMIN (GLUCOPHAGE) 1000 MG tablet Take 500 mg by mouth daily with breakfast.    Yes [provider]  mirabegron ER (MYRBETRIQ) 50 MG TB24 tablet Take 50 mg by mouth daily.   Yes [provider]  pantoprazole (PROTONIX) 40 MG tablet Take 40 mg by mouth daily.   Yes [provider]  potassium chloride (KLOR-CON) 20  MEQ packet Take 1 packet by mouth daily.   Yes [provider]  pregabalin (LYRICA) 50 MG capsule Take 50 mg by mouth 2 (two) times daily. 01/26/17 01/26/18 Yes [provider]  ranitidine (ZANTAC) 150 MG tablet Take 150 mg by mouth at bedtime.   Yes [provider]  roflumilast (DALIRESP) 500 MCG TABS tablet Take 1 tablet by mouth daily.   Yes [provider]  sennosides-docusate sodium (SENOKOT-S) 8.6-50 MG tablet Take 1 tablet by mouth daily.    Yes [provider]  tiotropium (SPIRIVA) 18 MCG inhalation capsule Place 18 mcg into inhaler and inhale daily. 2 PUFFS   Yes [provider]  traZODone (DESYREL) 50 MG tablet Take 50 mg by mouth at bedtime.  Yes [provider]  Vitamin D, Ergocalciferol, (DRISDOL) 50000 units CAPS capsule Take 50,000 Units by mouth every 30 (thirty) days.   Yes [provider]  acetaminophen (TYLENOL) 325 MG tablet Take 650 mg by mouth every 6 (six) hours as needed for moderate pain.    [provider]  albuterol (PROVENTIL HFA;VENTOLIN HFA) 108 (90 Base) MCG/ACT inhaler Inhale 1-2 puffs into the lungs every 4 (four) hours as needed for wheezing or shortness of breath.    [provider]  albuterol (PROVENTIL) (2.5 MG/3ML) 0.083% nebulizer solution Take 2.5 mg by nebulization every 4 (four) hours as needed for wheezing or shortness of breath.    [provider]  Carboxymethylcellul-Glycerin (REFRESH OPTIVE OP) Apply 2 drops to eye every 8 (eight) hours as needed (dry eyes).    [provider]  cetirizine (ZYRTEC) 10 MG tablet Take 10 mg by mouth at bedtime as needed for allergies.     [provider]  Dextromethorphan-Guaifenesin (TUSSIN DM) 10-100 MG/5ML liquid Take 10 mLs by mouth every 6 (six) hours as needed (cough).    [provider]  Dextrose, Diabetic Use, (GLUTOSE 15 PO) Take 1 Tube by mouth. For blood sugar less than 70, repeat in 5 minutes  up to 2 doses    [provider]  fluticasone (FLONASE) 50 MCG/ACT nasal spray Place 2 sprays into both nostrils daily as needed for rhinitis.    [provider]  HYDROcodone-acetaminophen (NORCO/VICODIN) 5-325 MG tablet Take 1 tablet by mouth daily as needed for moderate pain.     [provider]  magnesium hydroxide (MILK OF MAGNESIA) 400 MG/5ML suspension Take 15 mLs by mouth daily as needed for mild constipation.    [provider]  nitroGLYCERIN (NITROSTAT) 0.4 MG SL tablet Place 0.4 mg under the tongue every 5 (five) minutes as needed for chest pain.    [provider]  oxyCODONE-acetaminophen (PERCOCET) 10-325 MG tablet Take 1 tablet by mouth every 4 (four) hours as needed.    [provider]  predniSONE (DELTASONE) 50 MG tablet Take 1 tab PO daily for 5 days Patient not taking: Reported on 04/30/2017 11/04/16   Orbie Pyo, MD    Inpatient Medications:  . potassium chloride  40 mEq Oral Q4H  . Tdap  0.5 mL Intramuscular Once     Allergies:  Allergies  Allergen Reactions  . Cheese     Patient doesn't want any cheese    Social History   Socioeconomic History  . Marital status: Married    Spouse name: Not on file  . Number of children: Not on file  . Years of education: Not on file  . Highest education level: Not on file  Social Needs  . Financial resource strain: Not on file  . Food insecurity - worry: Not on file  . Food insecurity - inability: Not on file  . Transportation needs - medical: Not on file  . Transportation needs - non-medical: Not on file  Occupational History  . Occupation: retired  Tobacco Use  . Smoking status: Current Some Day Smoker    Packs/day: 0.50    Types: Cigarettes  . Smokeless tobacco: Never Used  Substance and Sexual Activity  . Alcohol use: No  . Drug use: No  . Sexual activity: No  Other Topics Concern  . Not on file  Social History Narrative  . Not on file      Family History  Problem Relation Age of Onset  .  Heart disease Father   . Diabetes Mellitus II Other      Review of Systems Positive for syncope Negative for: General:  chills, fever, night sweats or weight changes.  Cardiovascular: PND orthopnea positive for syncope dizziness  Dermatological skin lesions rashes Respiratory: Cough congestion Urologic: Frequent urination urination at night and hematuria Abdominal: negative for nausea, vomiting, diarrhea, bright red blood per rectum, melena, or hematemesis Neurologic: negative for visual changes, and/or hearing changes  All other systems reviewed and are otherwise negative except as noted above.  Labs: Recent Labs    04/30/17 0417  TROPONINI <0.03   Lab Results  Component Value Date   WBC 8.2 04/30/2017   HGB 8.8 (L) 04/30/2017   HCT 29.1 (L) 04/30/2017   MCV 69.6 (L) 04/30/2017   PLT 257 04/30/2017    Recent Labs  Lab 04/30/17 0417  NA 140  K 2.9*  CL 100*  CO2 30  BUN 11  CREATININE 0.81  CALCIUM 8.2*  PROT 6.2*  BILITOT 0.6  ALKPHOS 66  ALT 6*  AST 24  GLUCOSE 196*   Lab Results  Component Value Date   CHOL 87 07/04/2016   HDL 29 (L) 07/04/2016   LDLCALC 27 07/04/2016   TRIG 153 (H) 07/04/2016   No results found for: DDIMER  Radiology/Studies:  Dg Chest 2 View  Result Date: 04/30/2017 CLINICAL DATA:  Status post fall, with concern for chest injury. EXAM: CHEST  2 VIEW COMPARISON:  Chest radiograph performed 11/04/2016 FINDINGS: The lungs are well-aerated and clear. There is no evidence of focal opacification, pleural effusion or pneumothorax. The heart is normal in size; the mediastinal contour is within normal limits. No acute osseous abnormalities are seen. IMPRESSION: No acute cardiopulmonary process seen. No displaced rib fractures identified. Electronically Signed   By: Garald Balding M.D.   On: 04/30/2017 04:42   Ct Head Wo Contrast  Result Date: 04/30/2017 CLINICAL DATA:  Status post  fall, with frontal scalp hematoma, headache and left-sided neck pain. Patient on blood thinner. EXAM: CT HEAD WITHOUT CONTRAST CT CERVICAL SPINE WITHOUT CONTRAST TECHNIQUE: Multidetector CT imaging of the head and cervical spine was performed following the standard protocol without intravenous contrast. Multiplanar CT image reconstructions of the cervical spine were also generated. COMPARISON:  CT of the head performed 12/31/2016, and MRI/MRA of the brain and MRI of the cervical spine performed 03/03/2016 FINDINGS: CT HEAD FINDINGS Brain: No evidence of acute infarction, hemorrhage, hydrocephalus, extra-axial collection or mass lesion/mass effect. Prominence of the ventricles and sulci reflects moderate cortical volume loss. Cerebellar atrophy is noted. Scattered periventricular and subcortical white matter change likely reflects small vessel ischemic microangiopathy. The brainstem and fourth ventricle are within normal limits. The basal ganglia are unremarkable in appearance. The cerebral hemispheres demonstrate grossly normal gray-white differentiation. No mass effect or midline shift is seen. Vascular: No hyperdense vessel or unexpected calcification. Skull: There is no evidence of fracture; visualized osseous structures are unremarkable in appearance. Sinuses/Orbits: The visualized portions of the orbits are within normal limits. The paranasal sinuses and mastoid air cells are well-aerated. Other: Mild soft tissue swelling is noted overlying the left frontal calvarium. CT CERVICAL SPINE FINDINGS Alignment: Normal. Skull base and vertebrae: No acute fracture. No primary bone lesion or focal pathologic process. There is incomplete fusion of the posterior arch of C1. Soft tissues and spinal canal: No prevertebral fluid or swelling. No visible canal hematoma. Disc levels: Multilevel disc space narrowing is noted along the cervical spine, with scattered  anterior and posterior disc osteophyte complexes, and underlying  facet disease. Mild degenerative change is noted about the dens. Upper chest: The visualized lung apices are clear. The visualized portions of the thyroid gland are unremarkable. Scattered calcification is noted at the carotid bifurcations bilaterally. Other: No additional soft tissue abnormalities are seen. IMPRESSION: 1. No evidence of traumatic intracranial injury or fracture. 2. No evidence of fracture or subluxation along the cervical spine. 3. Mild soft tissue swelling overlying the left frontal calvarium. 4. Moderate cortical volume loss and scattered small vessel ischemic microangiopathy. 5. Mild degenerative change along the cervical spine. 6. Scattered calcification at the carotid bifurcations bilaterally. Carotid ultrasound is recommended for further evaluation, when and as deemed clinically appropriate. Electronically Signed   By: Garald Balding M.D.   On: 04/30/2017 04:40   Ct Cervical Spine Wo Contrast  Result Date: 04/30/2017 CLINICAL DATA:  Status post fall, with frontal scalp hematoma, headache and left-sided neck pain. Patient on blood thinner. EXAM: CT HEAD WITHOUT CONTRAST CT CERVICAL SPINE WITHOUT CONTRAST TECHNIQUE: Multidetector CT imaging of the head and cervical spine was performed following the standard protocol without intravenous contrast. Multiplanar CT image reconstructions of the cervical spine were also generated. COMPARISON:  CT of the head performed 12/31/2016, and MRI/MRA of the brain and MRI of the cervical spine performed 03/03/2016 FINDINGS: CT HEAD FINDINGS Brain: No evidence of acute infarction, hemorrhage, hydrocephalus, extra-axial collection or mass lesion/mass effect. Prominence of the ventricles and sulci reflects moderate cortical volume loss. Cerebellar atrophy is noted. Scattered periventricular and subcortical white matter change likely reflects small vessel ischemic microangiopathy. The brainstem and fourth ventricle are within normal limits. The basal ganglia  are unremarkable in appearance. The cerebral hemispheres demonstrate grossly normal gray-white differentiation. No mass effect or midline shift is seen. Vascular: No hyperdense vessel or unexpected calcification. Skull: There is no evidence of fracture; visualized osseous structures are unremarkable in appearance. Sinuses/Orbits: The visualized portions of the orbits are within normal limits. The paranasal sinuses and mastoid air cells are well-aerated. Other: Mild soft tissue swelling is noted overlying the left frontal calvarium. CT CERVICAL SPINE FINDINGS Alignment: Normal. Skull base and vertebrae: No acute fracture. No primary bone lesion or focal pathologic process. There is incomplete fusion of the posterior arch of C1. Soft tissues and spinal canal: No prevertebral fluid or swelling. No visible canal hematoma. Disc levels: Multilevel disc space narrowing is noted along the cervical spine, with scattered anterior and posterior disc osteophyte complexes, and underlying facet disease. Mild degenerative change is noted about the dens. Upper chest: The visualized lung apices are clear. The visualized portions of the thyroid gland are unremarkable. Scattered calcification is noted at the carotid bifurcations bilaterally. Other: No additional soft tissue abnormalities are seen. IMPRESSION: 1. No evidence of traumatic intracranial injury or fracture. 2. No evidence of fracture or subluxation along the cervical spine. 3. Mild soft tissue swelling overlying the left frontal calvarium. 4. Moderate cortical volume loss and scattered small vessel ischemic microangiopathy. 5. Mild degenerative change along the cervical spine. 6. Scattered calcification at the carotid bifurcations bilaterally. Carotid ultrasound is recommended for further evaluation, when and as deemed clinically appropriate. Electronically Signed   By: Garald Balding M.D.   On: 04/30/2017 04:40    EKG: Normal sinus rhythm  Weights: Filed Weights    04/30/17 0333  Weight: 86.2 kg (190 lb)     Physical Exam: Blood pressure (!) 102/56, pulse 81, temperature 98.5 F (36.9 C), temperature source Oral, resp.  rate 12, height 5\' 8"  (1.727 m), weight 86.2 kg (190 lb), SpO2 97 %. Body mass index is 28.89 kg/m. General: Well developed, well nourished, in no acute distress. Head eyes ears nose throat: Normocephalic, atraumatic, sclera non-icteric, no xanthomas, nares are without discharge. No apparent thyromegaly and/or mass  Lungs: Normal respiratory effort.  no wheezes, no rales, no rhonchi.  Heart: Irregular with normal S1 S2. no murmur gallop, no rub, PMI is normal size and placement, carotid upstroke normal without bruit, jugular venous pressure is normal Abdomen: Soft, non-tender, non-distended with normoactive bowel sounds. No hepatomegaly. No rebound/guarding. No obvious abdominal masses. Abdominal aorta is normal size without bruit Extremities: No edema. no cyanosis, no clubbing, no ulcers  Peripheral : 2+ bilateral upper extremity pulses, 2+ bilateral femoral pulses, 2+ bilateral dorsal pedal pulse Neuro: Alert and oriented. No facial asymmetry. No focal deficit. Moves all extremities spontaneously. Musculoskeletal: Normal muscle tone without kyphosis Psych:  Responds to questions appropriately with a normal affect.    Assessment: 76 year old male with known coronary artery disease status post multiple previous stents old myocardial infarction Parkinson's diabetes COPD paroxysmal atrial fibrillation with acute syncopal episode multifactorial in nature including anemia Parkinson's and possible vasovagal component without evidence of heart failure or myocardial infarction angina or significant bleeding  Plan: 1.  Abstain from anticoagulation at this time due to significant anemia exacerbating probable syncope 2.  Further watch for telemetry for the possibility of rhythm disturbances contributing to above 3.  No further cardiac  diagnostics necessary at this time unless further cardiovascular symptoms occur 4.  Begin ambulation and follow for improvement of symptoms and further treatment options depending on any new symptoms 5.  Proceed to physical rehabilitation with probable discharge to home or facility with follow-up next week  Signed, Corey Skains M.D. [facility] 04/30/2017, 8:18 AM

## 2017-04-30 NOTE — ED Provider Notes (Signed)
[facility] Emergency Department Provider Note  ____________________________________________   First MD Initiated Contact with Patient 04/30/17 [phone]     (approximate)  I have reviewed the triage vital signs and the nursing notes.   HISTORY  Chief Complaint Fall   HPI Jimmy Hill is a 76 y.o. male who comes to the emergency department by EMS after a syncopal event.  She said that this morning he got up to go to the bathroom and after urinating he felt lightheaded and the next thing he knew he woke up on the ground with a laceration to his forehead.  His symptoms began suddenly and passed quickly.  He denies antecedent chest pain or shortness of breath.  He denies palpitations.  He did have a recent colonoscopy and endoscopy 2 weeks ago at [facility] with results below.  He takes an unknown blood thinning medication.  His symptoms seem to be worsened by standing up quickly and somewhat improved when lying down.  04/17/17 Cscope: Impression:- Preparation of the colon was fair.   - Two 5 to 7 mm polyps in the descending colon and in the    transverse colon, removed with a hot snare. Resected and    retrieved.   - The examination was otherwise normal on direct and    retroflexion views.   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  Recommendation:- Patient has a contact number available for emergencies.    The signs and symptoms of potential delayed complications    were discussed with the patient. Return to normal    activities tomorrow. Written discharge instructions were    provided to the patient.   - Resume previous diet.   - Continue present medications.   - Repeat colonoscopy is not recommended for  surveillance.    04/17/17 Endoscopy: Impression:- Normal esophagus.  - Gastric mucosal atrophy. Biopsied.  - Duodenal erosions without bleeding.  Past Medical History:  Diagnosis Date  . Allergic rhinitis   . Anginal pain ([place])   . Arthritis   . Autonomic dysreflexia   . Back pain    CHRONIC LBP,STENOSIS  . CHF (congestive heart failure) ([place])   . Chronic kidney disease    STAGE 3  . COPD (chronic obstructive pulmonary disease) ([place])   . Coronary artery disease   . Depression   . Diabetes ([place])   . Diabetes mellitus without complication ([place])   . DJD (degenerative joint disease)   . Dysphagia   . Dysrhythmia    A-fib, now converted  . Edema    FEET/ANKLES  . GERD (gastroesophageal reflux disease)   . Glaucoma   . H/O orthostatic hypotension   . Hypercholesterolemia   . Myocardial infarction ([place]) 2000  . Parkinson's disease ([place])   . Peripheral vascular disease ([place])   . Pneumonia    history of   . Polyneuropathy   . RLS (restless legs syndrome)   . Seizures ([place])    when in rehab for his hip  . Sleep apnea   . Smoker   . Spinal stenosis   . Strabismic amblyopia   . Tinnitus     Patient Active Problem List   Diagnosis Date Noted  . Syncope 04/30/2017  . New onset atrial fibrillation ([place]) 07/03/2016  . Loss of weight   . Gastritis without bleeding   . Occult GI bleeding   . GI bleed 03/02/2016  . Left-sided weakness 03/02/2016  . Pressure ulcer 12/29/2015  . Altered mental status 12/28/2015  .  Fracture of femoral neck, left ([place]) 12/03/2015  . CAD (coronary artery disease) 12/03/2015  . Sleep apnea 12/03/2015  . Femoral neck fracture, left, closed, initial encounter 12/03/2015  . COPD (chronic obstructive pulmonary disease) ([place]) 02/05/2015  . Acute bronchitis 02/05/2015  . Type 2 diabetes mellitus ([place]) 02/05/2015  . Chest pain 12/09/2014    Past Surgical History:  Procedure Laterality Date  . ANKLE  SURGERY    . APPENDECTOMY    . BACK SURGERY    . CATARACT EXTRACTION W/PHACO Left 08/26/2016   Procedure: CATARACT EXTRACTION PHACO AND INTRAOCULAR LENS PLACEMENT (IOC);  Surgeon: Birder Robson, MD;  Location: [facility] ORS;  Service: Ophthalmology;  Laterality: Left;  [place] 01:37 AP% 19.4 CDE 18.97 fluid pack lot # [id number]  . CORONARY ANGIOPLASTY     STENT  . CTR    . ESOPHAGOGASTRODUODENOSCOPY (EGD) WITH PROPOFOL N/A 03/04/2016   Procedure: ESOPHAGOGASTRODUODENOSCOPY (EGD) WITH PROPOFOL;  Surgeon: [name], MD;  Location: [facility] ENDOSCOPY;  Service: Endoscopy;  Laterality: N/A;  . EYE SURGERY     cataract  . FRACTURE SURGERY    . HIP PINNING,CANNULATED Left 12/04/2015   Procedure: CANNULATED HIP PINNING;  Surgeon: Thornton Park, MD;  Location: [facility] ORS;  Service: Orthopedics;  Laterality: Left;    Prior to Admission medications   Medication Sig Start Date End Date Taking? Authorizing Provider  acetaminophen (TYLENOL) 325 MG tablet Take 650 mg by mouth every 6 (six) hours as needed for moderate pain.    [provider]  albuterol (PROVENTIL HFA;VENTOLIN HFA) 108 (90 Base) MCG/ACT inhaler Inhale 1-2 puffs into the lungs every 4 (four) hours as needed for wheezing or shortness of breath.    [provider]  albuterol (PROVENTIL) (2.5 MG/3ML) 0.083% nebulizer solution Take 2.5 mg by nebulization every 4 (four) hours as needed for wheezing or shortness of breath.    [provider]  apixaban (ELIQUIS) 5 MG TABS tablet Take 1 tablet (5 mg total) by mouth 2 (two) times daily. 07/04/16   Bettey Costa, MD  aspirin EC 81 MG tablet Take 81 mg by mouth daily.    [provider]  atorvastatin (LIPITOR) 80 MG tablet Take 80 mg by mouth at bedtime.    [provider]  brimonidine-timolol (COMBIGAN) 0.2-0.5 % ophthalmic solution Place 1 drop into both eyes 2 (two) times daily.    [provider]  carbidopa-levodopa (SINEMET IR) 25-250 MG tablet Take 2  tablets by mouth at 8am, 1 tablet at noon, 1 tablet at 16:30, and 1 tablet at 22:00    [provider]  Carboxymethylcellul-Glycerin (REFRESH OPTIVE OP) Apply 2 drops to eye every 8 (eight) hours as needed (dry eyes).    [provider]  cetirizine (ZYRTEC) 10 MG tablet Take 10 mg by mouth at bedtime as needed for allergies.     [provider]  Dextromethorphan-Guaifenesin (TUSSIN DM) 10-100 MG/5ML liquid Take 10 mLs by mouth every 6 (six) hours as needed (cough).    [provider]  Dextrose, Diabetic Use, (GLUTOSE 15 PO) Take 1 Tube by mouth. For blood sugar less than 70, repeat in 5 minutes up to 2 doses    [provider]  diltiazem (CARDIZEM CD) 120 MG 24 hr capsule Take 1 capsule (120 mg total) by mouth daily. 07/05/16   Bettey Costa, MD  fludrocortisone (FLORINEF) 0.1 MG tablet Take 0.2 mg by mouth daily.    [provider]  fluticasone (FLONASE) 50 MCG/ACT nasal spray Place 2 sprays  into both nostrils daily as needed for rhinitis.    [provider]  Fluticasone-Salmeterol (ADVAIR) 500-50 MCG/DOSE AEPB Inhale 1 puff into the lungs 2 (two) times daily.     [provider]  gabapentin (NEURONTIN) 600 MG tablet Take 600 mg by mouth daily.     [provider]  guaiFENesin (MUCINEX) 600 MG 12 hr tablet Take 600 mg by mouth every 12 (twelve) hours as needed.     [provider]  HYDROcodone-acetaminophen (NORCO/VICODIN) 5-325 MG tablet Take 1 tablet by mouth daily as needed for moderate pain.     [provider]  levETIRAcetam (KEPPRA) 500 MG tablet Take 500 mg by mouth every 12 (twelve) hours.     [provider]  loperamide (IMODIUM) 2 MG capsule Take 2 mg by mouth as needed for diarrhea or loose stools. Max 6 capsules per day    [provider]  magnesium hydroxide (MILK OF MAGNESIA) 400 MG/5ML suspension Take 15 mLs by mouth daily as needed for mild constipation.    [provider]  metFORMIN (GLUCOPHAGE) 1000 MG tablet Take 1,000 mg by mouth 2 (two) times daily with a meal.    [provider]  naloxone (NARCAN) nasal spray 4 mg/0.1 mL Place 1 spray into the nose once. Repeat every 3 minutes as needed if no response.  Call 911 and [place].    [provider]  nitroGLYCERIN (NITROSTAT) 0.4 MG SL tablet Place 0.4 mg under the tongue every 5 (five) minutes as needed for chest pain.    [provider]  Oxycodone HCl 10 MG TABS Take 10 mg by mouth every 12 (twelve) hours as needed (sciatia pain).    [provider]  predniSONE (DELTASONE) 50 MG tablet Take 1 tab PO daily for 5 days 11/04/16   Orbie Pyo, MD  ranitidine (ZANTAC) 150 MG tablet Take 150 mg by mouth at bedtime.    [provider]  sennosides-docusate sodium (SENOKOT-S) 8.6-50 MG tablet Take 1 tablet by mouth daily.     [provider]  tiotropium (SPIRIVA) 18 MCG inhalation capsule Place 18 mcg into inhaler and inhale daily. 2 PUFFS    [provider]  Vitamin D, Ergocalciferol, (DRISDOL) 50000 units CAPS capsule Take 50,000 Units by mouth every 30 (thirty) days.    [provider]    Allergies Cheese  Family History  Problem Relation Age of Onset  . Heart disease Father     Social History Social History   Tobacco Use  . Smoking status: Current Some Day Smoker    Packs/day: 0.50    Types: Cigarettes  . Smokeless tobacco: Never Used  Substance Use Topics  . Alcohol use: No  . Drug use: No    Review of Systems Constitutional: No fever/chills Eyes: No visual changes. ENT: No sore throat. Cardiovascular: Denies chest pain. Respiratory: Denies shortness of breath. Gastrointestinal: No abdominal pain.  No nausea, no vomiting.  No diarrhea.  No constipation. Genitourinary: Negative for dysuria. Musculoskeletal: Negative for back pain. Skin: Positive for wound Neurological: Negative for headaches,  focal weakness or numbness.   ____________________________________________   PHYSICAL EXAM:  VITAL SIGNS: ED Triage Vitals  Enc Vitals Group     BP      Pulse      Resp      Temp      Temp src      SpO2      Weight  Height      Head Circumference      Peak Flow      Pain Score      Pain Loc      Pain Edu?      Excl. in [place]?     Constitutional: Pleasant cooperative speaks in full clear sentences no diaphoresis Eyes: PERRL EOMI. Head: 2 cm laceration to forehead. Nose: No congestion/rhinnorhea. Mouth/Throat: No trismus Neck: No stridor.  No step-offs  Cardiovascular: Normal rate, regular rhythm. Grossly normal heart sounds.  Good peripheral circulation. Respiratory: Normal respiratory effort.  No retractions. Lungs CTAB and moving good air Gastrointestinal: Soft nontender Guaiac positive control positive brown stool Musculoskeletal: No lower extremity edema   Neurologic:  Normal speech and language. No gross focal neurologic deficits are appreciated. Skin:  Skin is warm, dry and intact. No rash noted. Psychiatric: Mood and affect are normal. Speech and behavior are normal.    ____________________________________________   DIFFERENTIAL includes but not limited to  Cardiogenic syncope, vasovagal syncope, GI bleed, arrhythmia, metabolic derangement ____________________________________________   LABS (all labs ordered are listed, but only abnormal results are displayed)  Labs Reviewed  COMPREHENSIVE METABOLIC PANEL - Abnormal; Notable for the following components:      Result Value   Potassium 2.9 (*)    Chloride 100 (*)    Glucose, Bld 196 (*)    Calcium 8.2 (*)    Total Protein 6.2 (*)    Albumin 3.0 (*)    ALT 6 (*)    All other components within normal limits  CBC WITH DIFFERENTIAL/PLATELET - Abnormal; Notable for the following components:   RBC 4.18 (*)    Hemoglobin 8.8 (*)    HCT 29.1 (*)    MCV 69.6 (*)    MCH 21.0 (*)    MCHC 30.2 (*)     RDW 17.8 (*)    All other components within normal limits  PROTIME-INR - Abnormal; Notable for the following components:   Prothrombin Time 19.4 (*)    All other components within normal limits  TROPONIN I    Blood work reviewed by me shows hemoglobin of 8.8 which is 12.4 recently.  Low MCV high RDW concerning for GI bleed __________________________________________  EKG  ED ECG REPORT I, Darel Hong, the attending physician, personally viewed and interpreted this ECG.  Date: 04/30/2017 EKG Time:  Rate: 88 Rhythm: normal sinus rhythm QRS Axis: normal Intervals: Prolonged QTC ST/T Wave abnormalities: normal Narrative Interpretation: no evidence of acute ischemia  ____________________________________________  RADIOLOGY  Head CT and cervical spine CTs reviewed by me with no acute disease ____________________________________________   PROCEDURES  Procedure(s) performed: yes  .Marland KitchenLaceration Repair Date/Time: 04/30/2017 7:06 AM Performed by: Darel Hong, MD Authorized by: Darel Hong, MD   Consent:    Consent obtained:  Verbal   Consent given by:  Patient   Risks discussed:  Infection, pain and poor cosmetic result   Alternatives discussed:  No treatment and delayed treatment Anesthesia (see MAR for exact dosages):    Anesthesia method:  None Laceration details:    Location:  Face   Length (cm):  2 Exploration:    Contaminated: no   Treatment:    Area cleansed with:  Saline   Amount of cleaning:  Standard Comments:     Dermabond with Steri-Strips over top    Critical Care performed: no  Observation: no ____________________________________________   INITIAL IMPRESSION / ASSESSMENT AND PLAN / ED COURSE  Pertinent labs & imaging results that  were available during my care of the patient were reviewed by me and considered in my medical decision making (see chart for details).  Patient arrives after concerning syncopal episode given the severity of  his facial trauma.  Head CT and cervical spine CTs fortunately negative for acute disease.  EKG with slightly prolonged QTC and he does have a history of coronary artery disease.  Hemoglobin 8.8 with guaiac positive stool.  Low MCV high RDW concerning for acute GI bleed.  His facial wound was repaired with good cosmesis.  I discussed the case with the hospitalist Dr. Marcille Blanco who is graciously agreed to admit the patient to his service for evaluation of ongoing GI bleed and symptomatic anemia.  The patient verbalized understanding and agreement the plan.      ____________________________________________   FINAL CLINICAL IMPRESSION(S) / ED DIAGNOSES  Final diagnoses:  Fall, initial encounter  Laceration of scalp, initial encounter  Minor head injury, initial encounter  Syncope, unspecified syncope type  Symptomatic anemia      NEW MEDICATIONS STARTED DURING THIS VISIT:  This SmartLink is deprecated. Use AVSMEDLIST instead to display the medication list for a patient.   Note:  This document was prepared using Dragon voice recognition software and may include unintentional dictation errors.     Darel Hong, MD 04/30/17 [phone]

## 2017-04-30 NOTE — ED Triage Notes (Signed)
Patient lives at home and is coming via EMS for fall. Patient woke up and went to bathroom and then woke up in the bathroom with hematoma on head and blood. Patient was in living room when EMS got there. Patient lives by himself and does take a blood thinner but not sure which one. Patient is complaining of head and neck pain.

## 2017-04-30 NOTE — Progress Notes (Signed)
Patient adamantly refused to drink the go lytely.  He says he drank it 2 weeks ago and had an endoscopy and colonoscopy and is not doing it again.  Dr. Bridgett Larsson, hospitalist on call was informed.

## 2017-04-30 NOTE — Care Management Obs Status (Signed)
[facility] NOTIFICATION   Patient Details  Name: Jimmy Hill MRN: [id number] Date of Birth: [birth year]   Medicare Observation Status Notification Given:  Yes  Patient was not able to signed electronically as could not operate the mouse.  Notice signed, one given to patient and the other to HIM for scanning   Katrina Stack, RN 04/30/2017, 5:17 PM

## 2017-04-30 NOTE — Care Management Note (Signed)
Case Management Note  Patient Details  Name: Jimmy Hill MRN: [id number] Date of Birth: November 10, 1940  Subjective/Objective:                 patient is followed by [facility].  He lives at home alone.  He uses his wheelchair to get around the house.  [facility] will transport home at discharge.  Placed in observation after he passed out at home and hit his head.  Recent Colonoscopy.  Hgb 8.8  Action/Plan:   Expected Discharge Date:  05/02/17               Expected Discharge Plan:     In-House Referral:     Discharge planning Services     Post Acute Care Choice:    Choice offered to:     DME Arranged:    DME Agency:     HH Arranged:    HH Agency:     Status of Service:     If discussed at [employer], dates discussed:    Additional Comments:  Katrina Stack, RN 04/30/2017, 5:18 PM

## 2017-04-30 NOTE — Consult Note (Addendum)
Jonathon Bellows , MD [address] [place], [place], [place], [address] [address], [place], [place], [place], [address] Phone: [phone]  Fax: [phone]  Consultation  Referring Provider: Dr Marthann Schiller  Primary Care Physician:  [place] [facility]: [facility]         Reason for Consultation:     Anemia   Date of Admission:  04/30/2017 Date of Consultation:  04/30/2017         HPI:   Jimmy Hill is a 76 y.o. male has been previously being seen at [facility] for anemia.  The last office visit with [facility] was in 02/06/2016 and at that time he had never had a colonoscopy.  An attempt was made at a prior inpatient hospitalization and he was not cleaned out.  Review of epic suggest that he had a colonoscopy on 04/17/2017 prep of the colon was felt to polyps 5-7 mm in the descending colon and transverse colon were dissected with a hot snare and he tried a large lipoma seen in the transverse colon.  He also underwent an upper endoscopy.  The esophagus was normal and the gastric mucosa appeared atrophied and was biopsied and resulted as a reactive gastropathy with no H. pylori.  The polyps of the transverse colon were adenomatous.  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  There was thickening of the distal esophagus likely secondary.  I do not see any mention of this in the GI note.  Looking back into his prior CBCs it appears that there was no evidence of anemia even recently as early as 02/08/2016.  We also have a CBC from 12/31/2016 which showed a hemoglobin of 12.4 and MCV of 83.4  On this admission he was admitted on 04/30/2017 after fainting.  He briefly lost consciousness.  CT scan of the abdomen showed no acute process.  Laboratory evaluation revealed anemia and guaiac positive stool and is admitted.  He has  coronary artery disease with atrial fibrillation, PCI and stent placements in the right coronary artery in 2017.  His records suggest that he has been on Eliquis and aspirin. Denies any nasal bleeds, says stool has been black for months , no new change, denies any hematemesis.    Past Medical History:  Diagnosis Date  . Allergic rhinitis   . Anginal pain ([place])   . Arthritis   . Autonomic dysreflexia   . Back pain    CHRONIC LBP,STENOSIS  . CHF (congestive heart failure) ([place])   . Chronic kidney disease    STAGE 3  . COPD (chronic obstructive pulmonary disease) ([place])   . Coronary artery disease   . Depression   . Diabetes ([place])   . Diabetes mellitus without complication ([place])   . DJD (degenerative joint disease)   . Dysphagia   . Dysrhythmia    A-fib, now converted  . Edema    FEET/ANKLES  . GERD (gastroesophageal reflux disease)   . Glaucoma   . H/O orthostatic hypotension   . Hypercholesterolemia   . Myocardial infarction ([place]) 2000  . Parkinson's disease ([place])   . Peripheral vascular disease ([place])   . Pneumonia    history of   . Polyneuropathy   . RLS (restless legs syndrome)   . Seizures ([place])    when in rehab for his hip  . Sleep apnea   .  Smoker   . Spinal stenosis   . Strabismic amblyopia   . Tinnitus     Past Surgical History:  Procedure Laterality Date  . ANKLE SURGERY    . APPENDECTOMY    . BACK SURGERY    . CATARACT EXTRACTION W/PHACO Left 08/26/2016   Procedure: CATARACT EXTRACTION PHACO AND INTRAOCULAR LENS PLACEMENT (IOC);  Surgeon: Birder Robson, MD;  Location: [facility] ORS;  Service: Ophthalmology;  Laterality: Left;  [place] 01:37 AP% 19.4 CDE 18.97 fluid pack lot # [id number]  . CORONARY ANGIOPLASTY     STENT  . CTR    . ESOPHAGOGASTRODUODENOSCOPY (EGD) WITH PROPOFOL N/A 03/04/2016   Procedure: ESOPHAGOGASTRODUODENOSCOPY (EGD) WITH PROPOFOL;  Surgeon: [name], MD;  Location: [facility] ENDOSCOPY;  Service: Endoscopy;  Laterality: N/A;  . EYE SURGERY      cataract  . FRACTURE SURGERY    . HIP PINNING,CANNULATED Left 12/04/2015   Procedure: CANNULATED HIP PINNING;  Surgeon: Thornton Park, MD;  Location: [facility] ORS;  Service: Orthopedics;  Laterality: Left;    Prior to Admission medications   Medication Sig Start Date End Date Taking? Authorizing Provider  apixaban (ELIQUIS) 5 MG TABS tablet Take 1 tablet (5 mg total) by mouth 2 (two) times daily. 07/04/16  Yes Bettey Costa, MD  aspirin EC 81 MG tablet Take 81 mg by mouth daily.   Yes [provider]  atorvastatin (LIPITOR) 80 MG tablet Take 80 mg by mouth at bedtime.   Yes [provider]  brimonidine-timolol (COMBIGAN) 0.2-0.5 % ophthalmic solution Place 1 drop into both eyes 2 (two) times daily.   Yes [provider]  carbidopa-levodopa (SINEMET IR) 25-250 MG tablet Take 1 tablet by mouth 4 (four) times daily.    Yes [provider]  citalopram (CELEXA) 20 MG tablet Take 20 mg by mouth daily.   Yes [provider]  Difluprednate 0.05 % EMUL Apply 1 drop to eye 2 (two) times daily.   Yes [provider]  diltiazem (CARDIZEM CD) 120 MG 24 hr capsule Take 1 capsule (120 mg total) by mouth daily. Patient taking differently: Take 120 mg by mouth 4 (four) times daily.  07/05/16  Yes Mody, Ulice Bold, MD  diltiazem (CARDIZEM) 120 MG tablet Take 120 mg by mouth 4 (four) times daily.   Yes [provider]  fludrocortisone (FLORINEF) 0.1 MG tablet Take 0.1 mg by mouth daily.    Yes [provider]  gabapentin (NEURONTIN) 600 MG tablet Take 600 mg by mouth 3 (three) times daily.    Yes [provider]  insulin glargine (LANTUS) 100 UNIT/ML injection Inject 20 Units into the skin daily.   Yes [provider]  levETIRAcetam (KEPPRA) 500 MG tablet Take 500 mg by mouth every 12 (twelve) hours.    Yes [provider]  lidocaine (XYLOCAINE) 5 % ointment Apply 1 application topically as needed.   Yes [provider]  metFORMIN (GLUCOPHAGE) 1000 MG tablet Take 500 mg by mouth daily with breakfast.    Yes [provider]  mirabegron ER (MYRBETRIQ) 50 MG TB24 tablet Take 50 mg by mouth daily.   Yes [provider]  pantoprazole (PROTONIX) 40 MG tablet Take 40 mg by mouth daily.   Yes [provider]  potassium chloride (KLOR-CON) 20 MEQ packet Take 1 packet by mouth daily.   Yes [provider]  pregabalin (LYRICA) 50 MG capsule Take 50 mg by mouth 2 (two) times daily. 01/26/17 01/26/18 Yes [provider]  ranitidine (  ZANTAC) 150 MG tablet Take 150 mg by mouth at bedtime.   Yes [provider]  roflumilast (DALIRESP) 500 MCG TABS tablet Take 1 tablet by mouth daily.   Yes [provider]  sennosides-docusate sodium (SENOKOT-S) 8.6-50 MG tablet Take 1 tablet by mouth daily.    Yes [provider]  tiotropium (SPIRIVA) 18 MCG inhalation capsule Place 18 mcg into inhaler and inhale daily. 2 PUFFS   Yes [provider]  traZODone (DESYREL) 50 MG tablet Take 50 mg by mouth at bedtime.   Yes [provider]  Vitamin D, Ergocalciferol, (DRISDOL) 50000 units CAPS capsule Take 50,000 Units by mouth every 30 (thirty) days.   Yes [provider]  acetaminophen (TYLENOL) 325 MG tablet Take 650 mg by mouth every 6 (six) hours as needed for moderate pain.    [provider]  albuterol (PROVENTIL HFA;VENTOLIN HFA) 108 (90 Base) MCG/ACT inhaler Inhale 1-2 puffs into the lungs every 4 (four) hours as needed for wheezing or shortness of breath.    [provider]  albuterol (PROVENTIL) (2.5 MG/3ML) 0.083% nebulizer solution Take 2.5 mg by nebulization every 4 (four) hours as needed for wheezing or shortness of breath.    [provider]  Carboxymethylcellul-Glycerin (REFRESH OPTIVE OP) Apply 2 drops to eye every 8 (eight) hours as needed (dry eyes).    [provider]  cetirizine  (ZYRTEC) 10 MG tablet Take 10 mg by mouth at bedtime as needed for allergies.     [provider]  Dextromethorphan-Guaifenesin (TUSSIN DM) 10-100 MG/5ML liquid Take 10 mLs by mouth every 6 (six) hours as needed (cough).    [provider]  Dextrose, Diabetic Use, (GLUTOSE 15 PO) Take 1 Tube by mouth. For blood sugar less than 70, repeat in 5 minutes up to 2 doses    [provider]  fluticasone (FLONASE) 50 MCG/ACT nasal spray Place 2 sprays into both nostrils daily as needed for rhinitis.    [provider]  HYDROcodone-acetaminophen (NORCO/VICODIN) 5-325 MG tablet Take 1 tablet by mouth daily as needed for moderate pain.     [provider]  magnesium hydroxide (MILK OF MAGNESIA) 400 MG/5ML suspension Take 15 mLs by mouth daily as needed for mild constipation.    [provider]  nitroGLYCERIN (NITROSTAT) 0.4 MG SL tablet Place 0.4 mg under the tongue every 5 (five) minutes as needed for chest pain.    [provider]  oxyCODONE-acetaminophen (PERCOCET) 10-325 MG tablet Take 1 tablet by mouth every 4 (four) hours as needed.    [provider]  predniSONE (DELTASONE) 50 MG tablet Take 1 tab PO daily for 5 days Patient not taking: Reported on 04/30/2017 11/04/16   Orbie Pyo, MD    Family History  Problem Relation Age of Onset  . Heart disease Father   . Diabetes Mellitus II Other      Social History   Tobacco Use  . Smoking status: Current Some Day Smoker    Packs/day: 0.50    Types: Cigarettes  . Smokeless tobacco: Never Used  Substance Use Topics  . Alcohol use: No  . Drug use: No    Allergies as of 04/30/2017 - Review Complete 04/30/2017  Allergen Reaction Noted  . Cheese  08/20/2016    Review of Systems:    All systems reviewed and negative except where noted in HPI.   Physical Exam:  Vital signs in last 24 hours: Temp:  [98.1 F (36.7 C)-98.5 F (  36.9 C)] 98.1 F (36.7 C) (12/13  0836) Pulse Rate:  [79-91] 91 (12/13 0841) Resp:  [12-22] 14 (12/13 0836) BP: (102-137)/(48-71) 107/57 (12/13 0841) SpO2:  [90 %-97 %] 94 % (12/13 0836) Weight:  [190 lb (86.2 kg)] 190 lb (86.2 kg) (12/13 0333)   General:   Pleasant, cooperative in NAD Head:  Normocephalic and atraumatic. Eyes:   No icterus.   Conjunctiva pink. PERRLA. Ears:  Normal auditory acuity. Neck:  Supple; no masses or thyroidomegaly Lungs: Respirations even and unlabored. Lungs clear to auscultation bilaterally.   No wheezes, crackles, or rhonchi.  Heart:  Regular rate and rhythm;  Without murmur, clicks, rubs or gallops Abdomen:  Soft, nondistended, nontender. Normal bowel sounds. No appreciable masses or hepatomegaly.  No rebound or guarding.  Neurologic:  Alert and oriented x3;  grossly normal neurologically. Skin:  Intact without significant lesions or rashes. Cervical Nodes:  No significant cervical adenopathy. Psych:  Alert and cooperative. Normal affect.  LAB RESULTS: Recent Labs    04/30/17 0417  WBC 8.2  HGB 8.8*  HCT 29.1*  PLT 257   BMET Recent Labs    04/30/17 0417  NA 140  K 2.9*  CL 100*  CO2 30  GLUCOSE 196*  BUN 11  CREATININE 0.81  CALCIUM 8.2*   LFT Recent Labs    04/30/17 0417  PROT 6.2*  ALBUMIN 3.0*  AST 24  ALT 6*  ALKPHOS 66  BILITOT 0.6   PT/INR Recent Labs    04/30/17 0422  LABPROT 19.4*  INR 1.65    STUDIES: Dg Chest 2 View  Result Date: 04/30/2017 CLINICAL DATA:  Status post fall, with concern for chest injury. EXAM: CHEST  2 VIEW COMPARISON:  Chest radiograph performed 11/04/2016 FINDINGS: The lungs are well-aerated and clear. There is no evidence of focal opacification, pleural effusion or pneumothorax. The heart is normal in size; the mediastinal contour is within normal limits. No acute osseous abnormalities are seen. IMPRESSION: No acute cardiopulmonary process seen. No displaced rib fractures identified. Electronically Signed   By: Garald Balding M.D.   On: 04/30/2017 04:42   Ct Head Wo Contrast  Result Date: 04/30/2017 CLINICAL DATA:  Status post fall, with frontal scalp hematoma, headache and left-sided neck pain. Patient on blood thinner. EXAM: CT HEAD WITHOUT CONTRAST CT CERVICAL SPINE WITHOUT CONTRAST TECHNIQUE: Multidetector CT imaging of the head and cervical spine was performed following the standard protocol without intravenous contrast. Multiplanar CT image reconstructions of the cervical spine were also generated. COMPARISON:  CT of the head performed 12/31/2016, and MRI/MRA of the brain and MRI of the cervical spine performed 03/03/2016 FINDINGS: CT HEAD FINDINGS Brain: No evidence of acute infarction, hemorrhage, hydrocephalus, extra-axial collection or mass lesion/mass effect. Prominence of the ventricles and sulci reflects moderate cortical volume loss. Cerebellar atrophy is noted. Scattered periventricular and subcortical white matter change likely reflects small vessel ischemic microangiopathy. The brainstem and fourth ventricle are within normal limits. The basal ganglia are unremarkable in appearance. The cerebral hemispheres demonstrate grossly normal gray-white differentiation. No mass effect or midline shift is seen. Vascular: No hyperdense vessel or unexpected calcification. Skull: There is no evidence of fracture; visualized osseous structures are unremarkable in appearance. Sinuses/Orbits: The visualized portions of the orbits are within normal limits. The paranasal sinuses and mastoid air cells are well-aerated. Other: Mild soft tissue swelling is noted overlying the left frontal calvarium. CT CERVICAL SPINE FINDINGS Alignment: Normal. Skull base and vertebrae: No acute fracture. No primary bone  lesion or focal pathologic process. There is incomplete fusion of the posterior arch of C1. Soft tissues and spinal canal: No prevertebral fluid or swelling. No visible canal hematoma. Disc levels: Multilevel disc space narrowing  is noted along the cervical spine, with scattered anterior and posterior disc osteophyte complexes, and underlying facet disease. Mild degenerative change is noted about the dens. Upper chest: The visualized lung apices are clear. The visualized portions of the thyroid gland are unremarkable. Scattered calcification is noted at the carotid bifurcations bilaterally. Other: No additional soft tissue abnormalities are seen. IMPRESSION: 1. No evidence of traumatic intracranial injury or fracture. 2. No evidence of fracture or subluxation along the cervical spine. 3. Mild soft tissue swelling overlying the left frontal calvarium. 4. Moderate cortical volume loss and scattered small vessel ischemic microangiopathy. 5. Mild degenerative change along the cervical spine. 6. Scattered calcification at the carotid bifurcations bilaterally. Carotid ultrasound is recommended for further evaluation, when and as deemed clinically appropriate. Electronically Signed   By: Garald Balding M.D.   On: 04/30/2017 04:40   Ct Cervical Spine Wo Contrast  Result Date: 04/30/2017 CLINICAL DATA:  Status post fall, with frontal scalp hematoma, headache and left-sided neck pain. Patient on blood thinner. EXAM: CT HEAD WITHOUT CONTRAST CT CERVICAL SPINE WITHOUT CONTRAST TECHNIQUE: Multidetector CT imaging of the head and cervical spine was performed following the standard protocol without intravenous contrast. Multiplanar CT image reconstructions of the cervical spine were also generated. COMPARISON:  CT of the head performed 12/31/2016, and MRI/MRA of the brain and MRI of the cervical spine performed 03/03/2016 FINDINGS: CT HEAD FINDINGS Brain: No evidence of acute infarction, hemorrhage, hydrocephalus, extra-axial collection or mass lesion/mass effect. Prominence of the ventricles and sulci reflects moderate cortical volume loss. Cerebellar atrophy is noted. Scattered periventricular and subcortical white matter change likely reflects  small vessel ischemic microangiopathy. The brainstem and fourth ventricle are within normal limits. The basal ganglia are unremarkable in appearance. The cerebral hemispheres demonstrate grossly normal gray-white differentiation. No mass effect or midline shift is seen. Vascular: No hyperdense vessel or unexpected calcification. Skull: There is no evidence of fracture; visualized osseous structures are unremarkable in appearance. Sinuses/Orbits: The visualized portions of the orbits are within normal limits. The paranasal sinuses and mastoid air cells are well-aerated. Other: Mild soft tissue swelling is noted overlying the left frontal calvarium. CT CERVICAL SPINE FINDINGS Alignment: Normal. Skull base and vertebrae: No acute fracture. No primary bone lesion or focal pathologic process. There is incomplete fusion of the posterior arch of C1. Soft tissues and spinal canal: No prevertebral fluid or swelling. No visible canal hematoma. Disc levels: Multilevel disc space narrowing is noted along the cervical spine, with scattered anterior and posterior disc osteophyte complexes, and underlying facet disease. Mild degenerative change is noted about the dens. Upper chest: The visualized lung apices are clear. The visualized portions of the thyroid gland are unremarkable. Scattered calcification is noted at the carotid bifurcations bilaterally. Other: No additional soft tissue abnormalities are seen. IMPRESSION: 1. No evidence of traumatic intracranial injury or fracture. 2. No evidence of fracture or subluxation along the cervical spine. 3. Mild soft tissue swelling overlying the left frontal calvarium. 4. Moderate cortical volume loss and scattered small vessel ischemic microangiopathy. 5. Mild degenerative change along the cervical spine. 6. Scattered calcification at the carotid bifurcations bilaterally. Carotid ultrasound is recommended for further evaluation, when and as deemed clinically appropriate. Electronically  Signed   By: Garald Balding M.D.   On: 04/30/2017  04:40      Impression / Plan:   LEWIE DEMAN is a 76 y.o. y/o male admitted with a syncopal episode and incidentally found to be severely anemic with significant microcytosis.  No iron studies available to determine if this is an iron deficiency anemia but probably is very likely an iron deficiency anemia.  He has had recent evaluation at [facility] in April 17, 2017 with an upper endoscopy and colonoscopy which showed no significant abnormality to account for this degree of anemia.  He has not had any small bowel evaluation.  I do not see any iron studies at [facility].  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  I cannot find in the records any pathology report suggesting a biopsy from the liver.  Plan   1.  Since he has had a recent upper endoscopy and colonoscopy and no overt blood loss I would suggest we proceed with a capsule study of the small bowel.  2.  He would need further evaluation of his liver mass , I am unsure if anything has been done since October 2017( patient cant recall any evaluation  Being done)  which is almost been a year,, if it is a cholangiocarcinoma his liver function test should appears at least minimally abnormal .  The fact that his liver function tests showed no obstructive pattern is probably a bit reassuring.  If no further workup has been done probably repeat imaging to determine present status of this lesion and if appropriate further evaluation with biopsy.   3.  Check iron studies B12 folate ferritin urine analysis celiac serology and replace with IV iron deficient.  Risks, benefits, alternatives of Givens capsule discussed with patient to include but not limited to the rare risk of Given's capsule becoming lodged in the GI tract  requiring surgical removal.  The patient agrees with this plan & consent will be obtained.  Thank you for involving me in the care of this patient.      LOS: 0 days   Jonathon Bellows, MD  04/30/2017, 9:24 AM

## 2017-04-30 NOTE — ED Notes (Signed)
Attempted to call report. Charge nurse going to call back to get report

## 2017-04-30 NOTE — Progress Notes (Signed)
*  PRELIMINARY RESULTS* Echocardiogram 2D Echocardiogram has been performed.  Jimmy Hill 04/30/2017, 11:29 AM

## 2017-04-30 NOTE — Progress Notes (Addendum)
Pt is refusing Endoscopy tomorrow 12/14, pt states that he "just had one two weeks ago" , also requesting food, I will page prime MD and order a diet for pt. Will continue to monitor.  2233 MD Jannifer Franklin ordered clear liquid diet for pt, as pt keeps refusing Endoscopy in AM. MD also DC IVF

## 2017-04-30 NOTE — Evaluation (Signed)
Physical Therapy Evaluation Patient Details Name: Jimmy Hill MRN: [id number] DOB: [birth year] Today's Date: 04/30/2017   History of Present Illness  76 y/o male admitted after syncopal episode in bathroom, laceration to forehead.  Pt with h/o coronary artery disease status post MI with stent placement, Parkinson's disease, COPD and diabetes.  Clinical Impression  Pt is able to ambulate ~60 ft into the hallway, but fatigued quickly and though he did not have any LOBs did not seem overly confident with the effort, though he reports being only minimally weaker than his baseline and feels confident about being able to go home with his baseline level of assist (aides BID, [facility] 3x/wk).  Pt reports the does not do a lot of exercising at [facility], this PT encouraged him to try to do more in that setting.  Pt did well with mobility and sitting/static balance.      Follow Up Recommendations Home health PT([facility] program)    Equipment Recommendations       Recommendations for Other Services       Precautions / Restrictions Precautions Precautions: Fall Restrictions Weight Bearing Restrictions: No      Mobility  Bed Mobility Overal bed mobility: Modified Independent             General bed mobility comments: Pt able to get to sitting w/o assist, donned shoes in sitting independently  Transfers Overall transfer level: Modified independent Equipment used: Rolling walker (2 wheeled)             General transfer comment: Pt was able to rise to standing w/o issue, though he did have   Ambulation/Gait Ambulation/Gait assistance: Min guard Ambulation Distance (Feet): 60 Feet Assistive device: Rolling walker (2 wheeled)       General Gait Details: Pt was able to slowly and cautiously ambulate into the hallway but did fatigue significantly with the effort and would likely not have tolerated much more at all.  He states she does not typically do much more than that amount of walking  anyway.   Stairs            Wheelchair Mobility    Modified Rankin (Stroke Patients Only)       Balance Overall balance assessment: Modified Independent                                           Pertinent Vitals/Pain Pain Assessment: No/denies pain    Home Living Family/patient expects to be discharged to:: Private residence Living Arrangements: Alone Available Help at Discharge: Personal care attendant;Family([facility] program, aides BID)   Home Access: Level entry     Home Layout: One level Home Equipment: Wheelchair - manual;Wheelchair - power;Youth worker - 2 wheels;Walker - 4 wheels      Prior Function Level of Independence: Independent with assistive device(s)         Comments: Pt reports that he does not normally do a lot of ambulation but is able to do all he needs at home     Hand Dominance        Extremity/Trunk Assessment   Upper Extremity Assessment Upper Extremity Assessment: Overall WFL for tasks assessed;Generalized weakness    Lower Extremity Assessment Lower Extremity Assessment: Overall WFL for tasks assessed;Generalized weakness       Communication   Communication: No difficulties  Cognition Arousal/Alertness: Awake/alert Behavior During Therapy: WFL for tasks assessed/performed Overall  Cognitive Status: Within Functional Limits for tasks assessed                                        General Comments      Exercises     Assessment/Plan    PT Assessment Patient needs continued PT services  PT Problem List Decreased strength;Decreased range of motion;Decreased activity tolerance;Decreased balance;Decreased mobility;Decreased coordination;Decreased knowledge of use of DME;Decreased safety awareness       PT Treatment Interventions DME instruction;Gait training;Stair training;Functional mobility training;Therapeutic exercise;Therapeutic activities;Balance training;Neuromuscular  re-education;Cognitive remediation;Patient/family education;Wheelchair mobility training    PT Goals (Current goals can be found in the Care Plan section)  Acute Rehab PT Goals Patient Stated Goal: go home PT Goal Formulation: With patient Time For Goal Achievement: 05/14/17 Potential to Achieve Goals: Fair    Frequency Min 2X/week   Barriers to discharge        Co-evaluation               AM-PAC PT "6 Clicks" Daily Activity  Outcome Measure Difficulty turning over in bed (including adjusting bedclothes, sheets and blankets)?: None Difficulty moving from lying on back to sitting on the side of the bed? : None Difficulty sitting down on and standing up from a chair with arms (e.g., wheelchair, bedside commode, etc,.)?: A Little Help needed moving to and from a bed to chair (including a wheelchair)?: None Help needed walking in hospital room?: A Little Help needed climbing 3-5 steps with a railing? : A Lot 6 Click Score: 20    End of Session Equipment Utilized During Treatment: Gait belt Activity Tolerance: Patient limited by fatigue;Patient tolerated treatment well Patient left: with chair alarm set;with call bell/phone within reach Nurse Communication: Mobility status PT Visit Diagnosis: Muscle weakness (generalized) (M62.81);Difficulty in walking, not elsewhere classified (R26.2)    Time: 9373-4287 PT Time Calculation (min) (ACUTE ONLY): 21 min   Charges:   PT Evaluation $PT Eval Low Complexity: 1 Low     PT G Codes:   PT G-Codes **NOT FOR INPATIENT CLASS** Functional Assessment Tool Used: AM-PAC 6 Clicks Basic Mobility Functional Limitation: Mobility: Walking and moving around Mobility: Walking and Moving Around Current Status (G8115): At least 20 percent but less than 40 percent impaired, limited or restricted Mobility: Walking and Moving Around Goal Status [phone]): At least 1 percent but less than 20 percent impaired, limited or restricted    Kreg Shropshire, DPT 04/30/2017, 3:57 PM

## 2017-05-01 ENCOUNTER — Inpatient Hospital Stay: Payer: Medicare (Managed Care)

## 2017-05-01 ENCOUNTER — Encounter
Admission: EM | Disposition: A | Discharge status: Home Health | Payer: Self-pay | Source: Home / Self Care | Attending: Specialist

## 2017-05-01 ENCOUNTER — Encounter: Payer: Self-pay | Admitting: *Deleted

## 2017-05-01 ENCOUNTER — Observation Stay: Payer: Medicare (Managed Care)

## 2017-05-01 DIAGNOSIS — E872 Acidosis: Secondary | ICD-10-CM | POA: Diagnosis present

## 2017-05-01 DIAGNOSIS — S0990XA Unspecified injury of head, initial encounter: Secondary | ICD-10-CM | POA: Diagnosis present

## 2017-05-01 DIAGNOSIS — Z9842 Cataract extraction status, left eye: Secondary | ICD-10-CM | POA: Diagnosis not present

## 2017-05-01 DIAGNOSIS — G4733 Obstructive sleep apnea (adult) (pediatric): Secondary | ICD-10-CM | POA: Diagnosis present

## 2017-05-01 DIAGNOSIS — I509 Heart failure, unspecified: Secondary | ICD-10-CM | POA: Diagnosis present

## 2017-05-01 DIAGNOSIS — Z955 Presence of coronary angioplasty implant and graft: Secondary | ICD-10-CM | POA: Diagnosis not present

## 2017-05-01 DIAGNOSIS — J189 Pneumonia, unspecified organism: Secondary | ICD-10-CM | POA: Diagnosis present

## 2017-05-01 DIAGNOSIS — J9601 Acute respiratory failure with hypoxia: Secondary | ICD-10-CM | POA: Diagnosis not present

## 2017-05-01 DIAGNOSIS — K219 Gastro-esophageal reflux disease without esophagitis: Secondary | ICD-10-CM | POA: Diagnosis present

## 2017-05-01 DIAGNOSIS — G2581 Restless legs syndrome: Secondary | ICD-10-CM | POA: Diagnosis present

## 2017-05-01 DIAGNOSIS — F329 Major depressive disorder, single episode, unspecified: Secondary | ICD-10-CM | POA: Diagnosis present

## 2017-05-01 DIAGNOSIS — J440 Chronic obstructive pulmonary disease with acute lower respiratory infection: Secondary | ICD-10-CM | POA: Diagnosis present

## 2017-05-01 DIAGNOSIS — A419 Sepsis, unspecified organism: Secondary | ICD-10-CM | POA: Diagnosis present

## 2017-05-01 DIAGNOSIS — E538 Deficiency of other specified B group vitamins: Secondary | ICD-10-CM | POA: Diagnosis not present

## 2017-05-01 DIAGNOSIS — I2510 Atherosclerotic heart disease of native coronary artery without angina pectoris: Secondary | ICD-10-CM | POA: Diagnosis present

## 2017-05-01 DIAGNOSIS — M1990 Unspecified osteoarthritis, unspecified site: Secondary | ICD-10-CM | POA: Diagnosis present

## 2017-05-01 DIAGNOSIS — G20 Parkinson's disease: Secondary | ICD-10-CM | POA: Diagnosis present

## 2017-05-01 DIAGNOSIS — D500 Iron deficiency anemia secondary to blood loss (chronic): Secondary | ICD-10-CM | POA: Diagnosis not present

## 2017-05-01 DIAGNOSIS — S0101XA Laceration without foreign body of scalp, initial encounter: Secondary | ICD-10-CM | POA: Diagnosis present

## 2017-05-01 DIAGNOSIS — E1142 Type 2 diabetes mellitus with diabetic polyneuropathy: Secondary | ICD-10-CM | POA: Diagnosis present

## 2017-05-01 DIAGNOSIS — E7800 Pure hypercholesterolemia, unspecified: Secondary | ICD-10-CM | POA: Diagnosis present

## 2017-05-01 DIAGNOSIS — W19XXXA Unspecified fall, initial encounter: Secondary | ICD-10-CM | POA: Diagnosis present

## 2017-05-01 DIAGNOSIS — N183 Chronic kidney disease, stage 3 (moderate): Secondary | ICD-10-CM | POA: Diagnosis present

## 2017-05-01 DIAGNOSIS — J181 Lobar pneumonia, unspecified organism: Secondary | ICD-10-CM | POA: Diagnosis not present

## 2017-05-01 DIAGNOSIS — I252 Old myocardial infarction: Secondary | ICD-10-CM | POA: Diagnosis not present

## 2017-05-01 DIAGNOSIS — H409 Unspecified glaucoma: Secondary | ICD-10-CM | POA: Diagnosis present

## 2017-05-01 DIAGNOSIS — D509 Iron deficiency anemia, unspecified: Secondary | ICD-10-CM | POA: Diagnosis not present

## 2017-05-01 DIAGNOSIS — R55 Syncope and collapse: Secondary | ICD-10-CM | POA: Diagnosis present

## 2017-05-01 DIAGNOSIS — E1122 Type 2 diabetes mellitus with diabetic chronic kidney disease: Secondary | ICD-10-CM | POA: Diagnosis present

## 2017-05-01 LAB — COMPREHENSIVE METABOLIC PANEL
ALBUMIN: 3.2 g/dL — AB (ref 3.5–5.0)
ALT: <5 U/L — ABNORMAL LOW (ref 17–63)
ANION GAP: 10 (ref 5–15)
AST: 24 U/L (ref 15–41)
Alkaline Phosphatase: 70 U/L (ref 38–126)
BILIRUBIN TOTAL: 1 mg/dL (ref 0.3–1.2)
BUN: 11 mg/dL (ref 6–20)
CALCIUM: 8 mg/dL — AB (ref 8.9–10.3)
CO2: 28 mmol/L (ref 22–32)
Chloride: 100 mmol/L — ABNORMAL LOW (ref 101–111)
Creatinine, Ser: 0.76 mg/dL (ref 0.61–1.24)
GFR calc non Af Amer: >60 mL/min (ref >60)
GLUCOSE: 121 mg/dL — AB (ref 65–99)
POTASSIUM: 3.4 mmol/L — AB (ref 3.5–5.1)
SODIUM: 138 mmol/L (ref 135–145)
TOTAL PROTEIN: 6.5 g/dL (ref 6.5–8.1)

## 2017-05-01 LAB — PHOSPHORUS: Phosphorus: 2.5 mg/dL (ref 2.5–4.6)

## 2017-05-01 LAB — LACTIC ACID, PLASMA
LACTIC ACID, VENOUS: 2.6 mmol/L — AB (ref 0.5–1.9)
LACTIC ACID, VENOUS: 2.8 mmol/L — AB (ref 0.5–1.9)
LACTIC ACID, VENOUS: 2.2 mmol/L — AB (ref 0.5–1.9)

## 2017-05-01 LAB — BASIC METABOLIC PANEL
Anion gap: 7 (ref 5–15)
BUN: 12 mg/dL (ref 6–20)
CO2: 29 mmol/L (ref 22–32)
CREATININE: 0.7 mg/dL (ref 0.61–1.24)
Calcium: 7.7 mg/dL — ABNORMAL LOW (ref 8.9–10.3)
Chloride: 101 mmol/L (ref 101–111)
GFR calc non Af Amer: >60 mL/min (ref >60)
Glucose, Bld: 143 mg/dL — ABNORMAL HIGH (ref 65–99)
Potassium: 3.1 mmol/L — ABNORMAL LOW (ref 3.5–5.1)
SODIUM: 137 mmol/L (ref 135–145)

## 2017-05-01 LAB — URINALYSIS, ROUTINE W REFLEX MICROSCOPIC
Bilirubin Urine: NEGATIVE
Glucose, UA: NEGATIVE mg/dL
Hgb urine dipstick: NEGATIVE
KETONES UR: 5 mg/dL — AB
LEUKOCYTES UA: NEGATIVE
NITRITE: NEGATIVE
PROTEIN: NEGATIVE mg/dL
Specific Gravity, Urine: 1.014 (ref 1.005–1.030)
pH: 6 (ref 5.0–8.0)

## 2017-05-01 LAB — HEMOGLOBIN
HEMOGLOBIN: 8.1 g/dL — AB (ref 13.0–18.0)
HEMOGLOBIN: 9 g/dL — AB (ref 13.0–18.0)
HEMOGLOBIN: 8.3 g/dL — AB (ref 13.0–18.0)

## 2017-05-01 LAB — GLUCOSE, CAPILLARY
GLUCOSE-CAPILLARY: 124 mg/dL — AB (ref 65–99)
Glucose-Capillary: 93 mg/dL (ref 65–99)
Glucose-Capillary: 112 mg/dL — ABNORMAL HIGH (ref 65–99)
Glucose-Capillary: 134 mg/dL — ABNORMAL HIGH (ref 65–99)
Glucose-Capillary: 116 mg/dL — ABNORMAL HIGH (ref 65–99)
Glucose-Capillary: 165 mg/dL — ABNORMAL HIGH (ref 65–99)

## 2017-05-01 LAB — PROCALCITONIN

## 2017-05-01 LAB — CBC WITH DIFFERENTIAL/PLATELET
BASOS PCT: 1 %
Basophils Absolute: 0.2 10*3/uL — ABNORMAL HIGH (ref 0–0.1)
EOS ABS: 0.1 10*3/uL (ref 0–0.7)
Eosinophils Relative: 1 %
HEMATOCRIT: 30.9 % — AB (ref 40.0–52.0)
Hemoglobin: 9.2 g/dL — ABNORMAL LOW (ref 13.0–18.0)
LYMPHS ABS: 0.7 10*3/uL — AB (ref 1.0–3.6)
Lymphocytes Relative: 4 %
MCH: 20.4 pg — AB (ref 26.0–34.0)
MCHC: 29.7 g/dL — AB (ref 32.0–36.0)
MCV: 68.9 fL — ABNORMAL LOW (ref 80.0–100.0)
MONO ABS: 0.9 10*3/uL (ref 0.2–1.0)
MONOS PCT: 5 %
Neutro Abs: 15.6 10*3/uL — ABNORMAL HIGH (ref 1.4–6.5)
Neutrophils Relative %: 89 %
Platelets: 271 10*3/uL (ref 150–440)
RBC: 4.48 MIL/uL (ref 4.40–5.90)
RDW: 18 % — AB (ref 11.5–14.5)
WBC: 17.4 10*3/uL — ABNORMAL HIGH (ref 3.8–10.6)

## 2017-05-01 LAB — PROTIME-INR
INR: 1.24
PROTHROMBIN TIME: 15.5 s — AB (ref 11.4–15.2)

## 2017-05-01 LAB — INFLUENZA PANEL BY PCR (TYPE A & B)
INFLAPCR: NEGATIVE
Influenza B By PCR: NEGATIVE

## 2017-05-01 LAB — APTT: aPTT: 35 seconds (ref 24–36)

## 2017-05-01 LAB — MRSA PCR SCREENING: MRSA BY PCR: NEGATIVE

## 2017-05-01 LAB — MAGNESIUM: MAGNESIUM: 1.7 mg/dL (ref 1.7–2.4)

## 2017-05-01 SURGERY — IMAGING PROCEDURE, GI TRACT, INTRALUMINAL, VIA CAPSULE

## 2017-05-01 MED ORDER — METHYLPREDNISOLONE SODIUM SUCC 40 MG IJ SOLR
40.0000 mg | Freq: Four times a day (QID) | INTRAMUSCULAR | Status: DC
  Administered 2017-05-01 – 2017-05-03 (×8): 40 mg via INTRAVENOUS
  Filled 2017-05-01 (×8): qty 1

## 2017-05-01 MED ORDER — VANCOMYCIN HCL 10 G IV SOLR
1500.0000 mg | Freq: Once | INTRAVENOUS | Status: AC
  Administered 2017-05-01: 1500 mg via INTRAVENOUS
  Filled 2017-05-01: qty 1500

## 2017-05-01 MED ORDER — FUROSEMIDE 10 MG/ML IJ SOLN
20.0000 mg | Freq: Once | INTRAMUSCULAR | Status: AC
  Administered 2017-05-01: 20 mg via INTRAVENOUS

## 2017-05-01 MED ORDER — POTASSIUM CHLORIDE CRYS ER 20 MEQ PO TBCR: 40.0000 meq | EXTENDED_RELEASE_TABLET | Freq: Once | ORAL | Status: DC

## 2017-05-01 MED ORDER — VANCOMYCIN HCL 10 G IV SOLR
1250.0000 mg | Freq: Two times a day (BID) | INTRAVENOUS | Status: DC
  Administered 2017-05-01 – 2017-05-03 (×4): 1250 mg via INTRAVENOUS
  Filled 2017-05-01 (×5): qty 1250

## 2017-05-01 MED ORDER — SODIUM CHLORIDE 0.9% FLUSH: 10.0000 mL | INTRAVENOUS | Status: DC | PRN

## 2017-05-01 MED ORDER — POTASSIUM CHLORIDE 10 MEQ/100ML IV SOLN
10.0000 meq | INTRAVENOUS | Status: AC
  Administered 2017-05-01 (×4): 10 meq via INTRAVENOUS
  Filled 2017-05-01 (×4): qty 100

## 2017-05-01 MED ORDER — SODIUM CHLORIDE 0.9 % IV SOLN
INTRAVENOUS | Status: DC
  Administered 2017-05-01 – 2017-05-03 (×5): via INTRAVENOUS

## 2017-05-01 MED ORDER — DEXTROSE 5 % IV SOLN
0.0000 ug/min | INTRAVENOUS | Status: DC
  Filled 2017-05-01: qty 4

## 2017-05-01 MED ORDER — SODIUM CHLORIDE 0.9 % IV BOLUS (SEPSIS)
1000.0000 mL | Freq: Once | INTRAVENOUS | Status: AC
  Administered 2017-05-01: 1000 mL via INTRAVENOUS

## 2017-05-01 MED ORDER — PIPERACILLIN-TAZOBACTAM 3.375 G IVPB
3.3750 g | Freq: Three times a day (TID) | INTRAVENOUS | Status: DC
  Administered 2017-05-01 – 2017-05-06 (×15): 3.375 g via INTRAVENOUS
  Filled 2017-05-01 (×14): qty 50

## 2017-05-01 MED ORDER — IPRATROPIUM-ALBUTEROL 0.5-2.5 (3) MG/3ML IN SOLN
3.0000 mL | Freq: Four times a day (QID) | RESPIRATORY_TRACT | Status: DC
  Administered 2017-05-01 – 2017-05-04 (×13): 3 mL via RESPIRATORY_TRACT
  Filled 2017-05-01 (×13): qty 3

## 2017-05-01 MED ORDER — MAGNESIUM SULFATE 4 GM/100ML IV SOLN
4.0000 g | Freq: Once | INTRAVENOUS | Status: AC
  Administered 2017-05-01: 4 g via INTRAVENOUS
  Filled 2017-05-01: qty 100

## 2017-05-01 MED ORDER — FUROSEMIDE 10 MG/ML IJ SOLN
INTRAMUSCULAR | Status: AC
  Administered 2017-05-01: 20 mg via INTRAVENOUS
  Filled 2017-05-01: qty 2

## 2017-05-01 MED ORDER — SODIUM CHLORIDE 0.9 % IV BOLUS (SEPSIS)
1500.0000 mL | Freq: Once | INTRAVENOUS | Status: AC
  Administered 2017-05-01: 1500 mL via INTRAVENOUS

## 2017-05-01 MED ORDER — VANCOMYCIN HCL IN DEXTROSE 1-5 GM/200ML-% IV SOLN
1000.0000 mg | Freq: Once | INTRAVENOUS | Status: DC
  Filled 2017-05-01: qty 200

## 2017-05-01 MED ORDER — SODIUM CHLORIDE 0.9% FLUSH
10.0000 mL | Freq: Two times a day (BID) | INTRAVENOUS | Status: DC
  Administered 2017-05-01 – 2017-05-02 (×3): 10 mL
  Administered 2017-05-02: 30 mL
  Administered 2017-05-03 – 2017-05-06 (×6): 10 mL
  Administered 2017-05-06: 20 mL

## 2017-05-01 NOTE — Progress Notes (Signed)
Subjective:  76 year old white male with known coronary artery disease, status post multiple PCI and stenting with most recent stent to the right coronary artery in 2017 and a patent obtuse marginal LAD stent.  He has had some history of paroxysmal atrial fibrillation for which he has had anticoagulation in the past, but this has been discontinued at this time due to his recent fall as well as ongoing anemia and recent hemoglobin of 8.8.  In addition, he has COPD, diabetes, and Parkinson's with higher risk of falling.  He was being prepared for discharge to home, when he developed fever and changes in mentation, for which he was transferred back to the ICU.  There have been no apparent new cardiac arrhythmias, and the patient continues to improve with IV fluids and IV antibiotics.  At this time, he is responsive and he denies chest pain or shortness of breath.  A chest x-ray performed prior to his transfer showed new right lower lobe infiltrates consistent with pneumonia, and he does have significant leukocytosis with white blood count of 17.  Objective:  Vital Signs in the last 24 hours: Temp:   99.5 Pulse Rate:  81 Resp:  13 BP: 107/62 SpO2:  97% Weight:  195.3 lb  Intake/Output from previous day:  Intake/Output from this shift:   Physical Exam: General: Well-developed, well-nourished and in no acute distress. HEENT: Normocephalic, with mid forehead laceration clean dry and intact. Lungs: Coarse breath sounds heard with scattered rhonchi.  No wheezes or rales. Heart: Regular with normal S1-S2.  No murmur gallop or rub heard.  Carotid upstroke normal without bruit, jugular venous pressure normal. Abdomen: Soft, nontender, nondistended with normoactive bowel sounds.  Abdominal aorta is normal size without bruit. Extremities: No edema.  No cyanosis, clubbing, or ulcers. Peripheral pulses: Intact throughout Neuro: Somnolent but arouses and answers questions appropriately.   Lab  Results:    Hepatic Function Panel     Imaging:  Cardiac Studies:  Assessment/Plan:  76 year old white male with known coronary artery disease, status post multiple previous stents, old myocardial infarction, Parkinson's disease, diabetes, paroxysmal atrial fibrillation, COPD, anemia, and recent fever with mental status changes requiring transfer to ICU. 1.  Continue to abstain from anticoagulation at this time due to significant anemia as well as fall risk. 2.  Continue to monitor for further cardiac arrhythmias 3.  Continue risk factor reduction for coronary artery disease, including blood pressure control, high intensity cholesterol management, smoking cessation, diet and exercise. 4.  Continue appropriate treatment for new onset pneumonia    Barbette Merino 05/01/2017, 4:26 PM

## 2017-05-01 NOTE — Care Management (Signed)
Rapid response, patient sent to ICU bed 17, changed to inpatient status. RNCM will follow.

## 2017-05-01 NOTE — Progress Notes (Signed)
PT Cancellation Note  Patient Details Name: Jimmy Hill MRN: [id number] DOB: 08-Jan-1941   Cancelled Treatment:    Reason Eval/Treat Not Completed: Patient not medically ready Pt had rapid response called with transfer to CCU.  Will need new orders to continue PT orders when medically appropriate.   Kreg Shropshire, DPT 05/01/2017, 10:58 AM

## 2017-05-01 NOTE — Care Management (Addendum)
This RNCM received call from [place] stating that she has received 7 fax referrals on this patient from [phone]. Patient is will [facility]- and they have been notified of patient's admission. [facility] [place] will not be able to serve this client.  [facility] provider will visit patient while in ICU.

## 2017-05-01 NOTE — Progress Notes (Signed)
Peripherally Inserted Central Catheter/Midline Placement  The IV Nurse has discussed with the patient and/or persons authorized to consent for the patient, the purpose of this procedure and the potential benefits and risks involved with this procedure.  The benefits include less needle sticks, lab draws from the catheter, and the patient may be discharged home with the catheter. Risks include, but not limited to, infection, bleeding, blood clot (thrombus formation), and puncture of an artery; nerve damage and irregular heartbeat and possibility to perform a PICC exchange if needed/ordered by physician.  Alternatives to this procedure were also discussed.  Bard Power PICC patient education guide, fact sheet on infection prevention and patient information card has been provided to patient /or left at bedside.    PICC/Midline Placement Documentation  PICC Triple Lumen 60/73/71 PICC Right Basilic 40 cm 0 cm (Active)  Indication for Insertion or Continuance of Line Vasoactive infusions 05/01/2017  2:11 PM  Exposed Catheter (cm) 0 cm 05/01/2017  2:11 PM  Site Assessment Clean;Dry;Intact 05/01/2017  2:11 PM  Lumen #1 Status Flushed;Saline locked;Blood return noted 05/01/2017  2:11 PM  Lumen #2 Status Flushed;Saline locked;Blood return noted 05/01/2017  2:11 PM  Lumen #3 Status Flushed;Saline locked;Blood return noted 05/01/2017  2:11 PM  Dressing Type Transparent 05/01/2017  2:11 PM  Dressing Status Clean;Dry;Intact 05/01/2017  2:11 PM  Dressing Change Due 05/08/17 05/01/2017  2:11 PM       Gordan Payment 05/01/2017, 2:12 PM

## 2017-05-01 NOTE — Care Management (Signed)
CM notified Sherita with pace that patient was transferred to icu stepdown and is now inpatient

## 2017-05-01 NOTE — Consult Note (Signed)
[facility] Consultation      Name: Jimmy Hill MRN: [id number] DOB: [birth year]    ADMISSION DATE:  04/30/2017 CONSULTATION DATE:  05/01/17    CHIEF COMPLAINT:   Acute hypoxemic respiratory failure   HISTORY OF PRESENT ILLNESS   76 years old gentleman with a past medical history significant for Parkinson's, coronary artery disease status post stenting, COPD, diabetes, CHF, CKD stage III, glaucoma, dyslipidemia, restless leg syndrome, polyneuropathy, obstructive sleep apnea, ongoing tobacco abuse-smokes half a pack of cigarettes a day who was admitted to the hospital yesterday with syncope and was found to have anemia.  The patient recently had undergone an EGD as well as colonoscopy at [facility].  He was seen on the floors yesterday by GI and capsule endoscopy was recommended.  The patient reportedly has a history of a liver mass and GI recommended a repeat imaging of the liver mass. He was also seen by cardiology and it was recommended to hold his anticoagulation.  This morning the patient developed significant hypoxemia.  Earlier on he was on room air and his FiO2 requirements went up to 15 L O2 via facemask.  The patient was transferred to the ICU/stepdown unit for further care.  In the ICU/stepdown unit, the patient denied chest pain, chest pressure, nausea, vomiting. Chest x-ray performed prior to transfer showed new right lower lobe infiltrates consistent with pneumonia. Stat labs showed mild hypokalemia with a potassium of 3.4.  Pro-calcitonin was negative.  Patient had lactic acidosis with a lactic acid levels of 2.6.  ABG showed significant AA gradient with pH of 743/46/70 on 15 L of oxygen. Influenza screen yesterday was negative. Echocardiogram performed on 04/30/17 showed ejection fraction of 50-55%.  The patient developed significant leukocytosis with a WBC count of 17.  His WBC count yesterday was normal.  Hemoglobin on admission was 8.6 and has  remained stable over the last few successive readings.  INR was normal at 1.5.  Over the next couple of hours in the ICU, the patient's blood pressure became borderline with systolic blood pressure in the 90s.  He was given 1 L of normal saline with some improvement in his blood pressure.  He is getting his second liter of normal saline infused now..  Since his arrival to the ICU, the patient's FiO2 requirements have decreased and he has oxygen has been weaned down to 6 L nasal cannula with O2 saturations of 95% on the monitor  The patient's FiO2 requirements have been weaned down and he is now on 6 L of oxygen with O2 saturation of 95%.   PAST MEDICAL HISTORY    :  Past Medical History:  Diagnosis Date  . Allergic rhinitis   . Anginal pain ([place])   . Arthritis   . Autonomic dysreflexia   . Back pain    CHRONIC LBP,STENOSIS  . CHF (congestive heart failure) ([place])   . Chronic kidney disease    STAGE 3  . COPD (chronic obstructive pulmonary disease) ([place])   . Coronary artery disease   . Depression   . Diabetes ([place])   . Diabetes mellitus without complication ([place])   . DJD (degenerative joint disease)   . Dysphagia   . Dysrhythmia    A-fib, now converted  . Edema    FEET/ANKLES  . GERD (gastroesophageal reflux disease)   . Glaucoma   . H/O orthostatic hypotension   . Hypercholesterolemia   . Myocardial infarction ([place]) 2000  . Parkinson's disease ([place])   .  Peripheral vascular disease ([place])   . Pneumonia    history of   . Polyneuropathy   . RLS (restless legs syndrome)   . Seizures ([place])    when in rehab for his hip  . Sleep apnea   . Smoker   . Spinal stenosis   . Strabismic amblyopia   . Tinnitus    Past Surgical History:  Procedure Laterality Date  . ANKLE SURGERY    . APPENDECTOMY    . BACK SURGERY    . CATARACT EXTRACTION W/PHACO Left 08/26/2016   Procedure: CATARACT EXTRACTION PHACO AND INTRAOCULAR LENS PLACEMENT (IOC);  Surgeon: Birder Robson, MD;   Location: [facility] ORS;  Service: Ophthalmology;  Laterality: Left;  [place] 01:37 AP% 19.4 CDE 18.97 fluid pack lot # [id number]  . CORONARY ANGIOPLASTY     STENT  . CTR    . ESOPHAGOGASTRODUODENOSCOPY (EGD) WITH PROPOFOL N/A 03/04/2016   Procedure: ESOPHAGOGASTRODUODENOSCOPY (EGD) WITH PROPOFOL;  Surgeon: [name], MD;  Location: [facility] ENDOSCOPY;  Service: Endoscopy;  Laterality: N/A;  . EYE SURGERY     cataract  . FRACTURE SURGERY    . HIP PINNING,CANNULATED Left 12/04/2015   Procedure: CANNULATED HIP PINNING;  Surgeon: Thornton Park, MD;  Location: [facility] ORS;  Service: Orthopedics;  Laterality: Left;   Prior to Admission medications   Medication Sig Start Date End Date Taking? Authorizing Provider  apixaban (ELIQUIS) 5 MG TABS tablet Take 1 tablet (5 mg total) by mouth 2 (two) times daily. 07/04/16  Yes Bettey Costa, MD  aspirin EC 81 MG tablet Take 81 mg by mouth daily.   Yes [provider]  atorvastatin (LIPITOR) 80 MG tablet Take 80 mg by mouth at bedtime.   Yes [provider]  brimonidine-timolol (COMBIGAN) 0.2-0.5 % ophthalmic solution Place 1 drop into both eyes 2 (two) times daily.   Yes [provider]  carbidopa-levodopa (SINEMET IR) 25-250 MG tablet Take 1 tablet by mouth 4 (four) times daily.    Yes [provider]  citalopram (CELEXA) 20 MG tablet Take 20 mg by mouth daily.   Yes [provider]  Difluprednate 0.05 % EMUL Apply 1 drop to eye 2 (two) times daily.   Yes [provider]  diltiazem (CARDIZEM CD) 120 MG 24 hr capsule Take 1 capsule (120 mg total) by mouth daily. Patient taking differently: Take 120 mg by mouth 4 (four) times daily.  07/05/16  Yes Mody, Ulice Bold, MD  diltiazem (CARDIZEM) 120 MG tablet Take 120 mg by mouth 4 (four) times daily.   Yes [provider]  fludrocortisone (FLORINEF) 0.1 MG tablet Take 0.1 mg by mouth daily.    Yes [provider]  gabapentin (NEURONTIN) 600 MG tablet Take 600  mg by mouth 3 (three) times daily.    Yes [provider]  insulin glargine (LANTUS) 100 UNIT/ML injection Inject 20 Units into the skin daily.   Yes [provider]  levETIRAcetam (KEPPRA) 500 MG tablet Take 500 mg by mouth every 12 (twelve) hours.    Yes [provider]  lidocaine (XYLOCAINE) 5 % ointment Apply 1 application topically as needed.   Yes [provider]  metFORMIN (GLUCOPHAGE) 1000 MG tablet Take 500 mg by mouth daily with breakfast.    Yes [provider]  mirabegron ER (MYRBETRIQ) 50 MG TB24 tablet Take 50 mg by mouth daily.   Yes [provider]  pantoprazole (PROTONIX) 40 MG tablet Take 40 mg by mouth daily.   Yes [provider]  potassium chloride (KLOR-CON) 20 MEQ packet Take 1 packet by mouth daily.   Yes [provider]  pregabalin (LYRICA) 50 MG capsule Take 50 mg by mouth 2 (two) times daily. 01/26/17 01/26/18 Yes [provider]  ranitidine (ZANTAC) 150 MG tablet Take 150 mg by mouth at bedtime.   Yes [provider]  roflumilast (DALIRESP) 500 MCG TABS tablet Take 1 tablet by mouth daily.   Yes [provider]  sennosides-docusate sodium (SENOKOT-S) 8.6-50 MG tablet Take 1 tablet by mouth daily.    Yes [provider]  tiotropium (SPIRIVA) 18 MCG inhalation capsule Place 18 mcg into inhaler and inhale daily. 2 PUFFS   Yes [provider]  traZODone (DESYREL) 50 MG tablet Take 50 mg by mouth at bedtime.   Yes [provider]  Vitamin D, Ergocalciferol, (DRISDOL) 50000 units CAPS capsule Take 50,000 Units by mouth every 30 (thirty) days.   Yes [provider]  acetaminophen (TYLENOL) 325 MG tablet Take 650 mg by mouth every 6 (six) hours as needed for moderate pain.    [provider]  albuterol (PROVENTIL HFA;VENTOLIN HFA) 108 (90 Base) MCG/ACT inhaler Inhale 1-2 puffs into the lungs every 4 (four) hours as needed for wheezing or  shortness of breath.    [provider]  albuterol (PROVENTIL) (2.5 MG/3ML) 0.083% nebulizer solution Take 2.5 mg by nebulization every 4 (four) hours as needed for wheezing or shortness of breath.    [provider]  Carboxymethylcellul-Glycerin (REFRESH OPTIVE OP) Apply 2 drops to eye every 8 (eight) hours as needed (dry eyes).    [provider]  cetirizine (ZYRTEC) 10 MG tablet Take 10 mg by mouth at bedtime as needed for allergies.     [provider]  Dextromethorphan-Guaifenesin (TUSSIN DM) 10-100 MG/5ML liquid Take 10 mLs by mouth every 6 (six) hours as needed (cough).    [provider]  Dextrose, Diabetic Use, (GLUTOSE 15 PO) Take 1 Tube by mouth. For blood sugar less than 70, repeat in 5 minutes up to 2 doses    [provider]  fluticasone (FLONASE) 50 MCG/ACT nasal spray Place 2 sprays into both nostrils daily as needed for rhinitis.    [provider]  HYDROcodone-acetaminophen (NORCO/VICODIN) 5-325 MG tablet Take 1 tablet by mouth daily as needed for moderate pain.     [provider]  magnesium hydroxide (MILK OF MAGNESIA) 400 MG/5ML suspension Take 15 mLs by mouth daily as needed for mild constipation.    [provider]  nitroGLYCERIN (NITROSTAT) 0.4 MG SL tablet Place 0.4 mg under the tongue every 5 (five) minutes as needed for chest pain.    [provider]  oxyCODONE-acetaminophen (PERCOCET) 10-325 MG tablet Take 1 tablet by mouth every 4 (four) hours as needed.    [provider]  predniSONE (DELTASONE) 50 MG tablet Take 1 tab PO daily for 5 days Patient not taking: Reported on 04/30/2017 11/04/16   Orbie Pyo, MD   Allergies  Allergen Reactions  . Cheese     Patient doesn't want any cheese     FAMILY HISTORY   Family History  Problem Relation Age of Onset  . Heart disease Father   . Diabetes Mellitus II Other       SOCIAL HISTORY    reports that he  has been smoking cigarettes.  He has been smoking about 0.50 packs per day. he has never used smokeless tobacco. He reports that he does  not drink alcohol or use drugs.  ROS Difficult to obtain as the patient is confused secondary to borderline hypotension.   VITAL SIGNS    Temp:  [98.3 F (36.8 C)-101.9 F (38.8 C)] 101.8 F (38.8 C) (12/14 0959) Pulse Rate:  [59-111] 111 (12/14 0959) Resp:  [16-24] 24 (12/14 0959) BP: (95-124)/(47-74) 124/54 (12/14 0959) SpO2:  [79 %-99 %] 99 % (12/14 0959) Weight:  [195 lb 3.2 oz (88.5 kg)] 195 lb 3.2 oz (88.5 kg) (12/14 0355) HEMODYNAMICS:   VENTILATOR SETTINGS:   INTAKE / OUTPUT:  Intake/Output Summary (Last 24 hours) at 05/01/2017 1336 Last data filed at 05/01/2017 1037 Gross per 24 hour  Intake 1200 ml  Output 1425 ml  Net -225 ml       PHYSICAL EXAM   Physical Exam  Awake, follows commands but is confused. Sclera anicteric. CVS S1, S2, 0. Chest: No wheezes.  Occasional crackles in the right lung base. Abdomen is soft, nontender, nondistended.  Bowel sounds are positive. No gross extremity deformities   LABS   LABS:  CBC Recent Labs  Lab 04/30/17 0417  05/01/17 0100 05/01/17 0904 05/01/17 1031  WBC 8.2  --   --   --  17.4*  HGB 8.8*   < > 8.1* 9.0* 9.2*  HCT 29.1*  --   --   --  30.9*  PLT 257  --   --   --  271   < > = values in this interval not displayed.   Coag's Recent Labs  Lab 04/30/17 0422 05/01/17 1031  APTT  --  35  INR 1.65 1.24   BMET Recent Labs  Lab 04/30/17 0417 05/01/17 0100 05/01/17 1031  NA 140 137 138  K 2.9* 3.1* 3.4*  CL 100* 101 100*  CO2 30 29 28   BUN 11 12 11   CREATININE 0.81 0.70 0.76  GLUCOSE 196* 143* 121*   Electrolytes Recent Labs  Lab 04/30/17 0417 05/01/17 0100 05/01/17 1031  CALCIUM 8.2* 7.7* 8.0*  MG 1.1* 1.7  --   PHOS  --   --  2.5   Sepsis Markers Recent Labs  Lab 05/01/17 1031 05/01/17 1222  LATICACIDVEN 2.6* 2.8*  PROCALCITON <0.10  --      ABG Recent Labs  Lab 05/01/17 1255  PHART 7.43  PCO2ART 46  PO2ART 70*   Liver Enzymes Recent Labs  Lab 04/30/17 0417 05/01/17 1031  AST 24 24  ALT 6* <5*  ALKPHOS 66 70  BILITOT 0.6 1.0  ALBUMIN 3.0* 3.2*   Cardiac Enzymes Recent Labs  Lab 04/30/17 0417  TROPONINI <0.03   Glucose Recent Labs  Lab 04/30/17 1700 04/30/17 2041 05/01/17 0815 05/01/17 0944 05/01/17 1022 05/01/17 1200  GLUCAP 243* 153* 93 134* 112* 124*     No results found for this or any previous visit (from the past 240 hour(s)).   Current Facility-Administered Medications:  .  0.9 %  sodium chloride infusion, , Intravenous, Continuous, Nettie Elm, MD .  acetaminophen (TYLENOL) tablet 650 mg, 650 mg, Oral, Q6H PRN, Harrie Foreman, MD .  albuterol (PROVENTIL) (2.5 MG/3ML) 0.083% nebulizer solution 2.5 mg, 2.5 mg, Nebulization, Q4H PRN, Harrie Foreman, MD, 2.5 mg at 05/01/17 0841 .  atorvastatin (LIPITOR) tablet 80 mg, 80 mg, Oral, QHS, Harrie Foreman, MD, 80 mg at 04/30/17 2139 .  brimonidine (ALPHAGAN) 0.2 % ophthalmic solution 1 drop, 1 drop, Both Eyes, BID, 1 drop at 04/30/17 2140 **AND** timolol (TIMOPTIC) 0.5 % ophthalmic solution  1 drop, 1 drop, Both Eyes, BID, Vaughan Basta, MD, 1 drop at 04/30/17 2140 .  carbidopa-levodopa (SINEMET IR) 25-250 MG per tablet immediate release 1 tablet, 1 tablet, Oral, TID, Vaughan Basta, MD, 1 tablet at 04/30/17 2140 .  carbidopa-levodopa (SINEMET IR) 25-250 MG per tablet immediate release 2 tablet, 2 tablet, Oral, Q breakfast, Harrie Foreman, MD, 2 tablet at 05/01/17 0920 .  docusate sodium (COLACE) capsule 100 mg, 100 mg, Oral, BID, Harrie Foreman, MD, 100 mg at 05/01/17 5093 .  fludrocortisone (FLORINEF) tablet 0.2 mg, 0.2 mg, Oral, Daily, Harrie Foreman, MD, 0.2 mg at 05/01/17 2671 .  fluticasone (FLONASE) 50 MCG/ACT nasal spray 2 spray, 2 spray, Each Nare, Daily PRN, Harrie Foreman, MD .  gabapentin  (NEURONTIN) tablet 600 mg, 600 mg, Oral, Daily, Harrie Foreman, MD, 600 mg at 05/01/17 2458 .  guaiFENesin-dextromethorphan (ROBITUSSIN DM) 100-10 MG/5ML syrup 10 mL, 10 mL, Oral, Q6H PRN, Harrie Foreman, MD, 10 mL at 04/30/17 2139 .  insulin aspart (novoLOG) injection 0-5 Units, 0-5 Units, Subcutaneous, QHS, Harrie Foreman, MD .  insulin aspart (novoLOG) injection 0-9 Units, 0-9 Units, Subcutaneous, TID WC, Harrie Foreman, MD, 1 Units at 05/01/17 1212 .  ipratropium-albuterol (DUONEB) 0.5-2.5 (3) MG/3ML nebulizer solution 3 mL, 3 mL, Nebulization, Q6H, Nettie Elm, MD, 3 mL at 05/01/17 1259 .  levETIRAcetam (KEPPRA) tablet 500 mg, 500 mg, Oral, Q12H, Harrie Foreman, MD, 500 mg at 05/01/17 0998 .  loperamide (IMODIUM) capsule 2 mg, 2 mg, Oral, PRN, Harrie Foreman, MD .  loratadine (CLARITIN) tablet 10 mg, 10 mg, Oral, Daily PRN, Harrie Foreman, MD .  magnesium hydroxide (MILK OF MAGNESIA) suspension 15 mL, 15 mL, Oral, Daily PRN, Harrie Foreman, MD .  magnesium sulfate IVPB 4 g 100 mL, 4 g, Intravenous, Once, Vaughan Basta, MD, Last Rate: 50 mL/hr at 05/01/17 1145, 4 g at 05/01/17 1145 .  methylPREDNISolone sodium succinate (SOLU-MEDROL) 40 mg/mL injection 40 mg, 40 mg, Intravenous, Q6H, Nettie Elm, MD, 40 mg at 05/01/17 1210 .  nitroGLYCERIN (NITROSTAT) SL tablet 0.4 mg, 0.4 mg, Sublingual, Q5 min PRN, Harrie Foreman, MD .  ondansetron [facility]) tablet 4 mg, 4 mg, Oral, Q6H PRN **OR** ondansetron (ZOFRAN) injection 4 mg, 4 mg, Intravenous, Q6H PRN, Harrie Foreman, MD .  oxyCODONE (Oxy IR/ROXICODONE) immediate release tablet 10 mg, 10 mg, Oral, Q12H PRN, Harrie Foreman, MD, 10 mg at 05/01/17 0346 .  pantoprazole (PROTONIX) injection 40 mg, 40 mg, Intravenous, Q12H, Harrie Foreman, MD, 40 mg at 05/01/17 3382 .  piperacillin-tazobactam (ZOSYN) IVPB 3.375 g, 3.375 g, Intravenous, Q8H, Vaughan Basta, MD, Last Rate: 12.5 mL/hr at 05/01/17  1100, 3.375 g at 05/01/17 1100 .  polyethylene glycol-electrolytes (NuLYTELY/GoLYTELY) solution 4,000 mL, 4,000 mL, Oral, Once, Jonathon Bellows, MD .  potassium chloride 10 mEq in 100 mL IVPB, 10 mEq, Intravenous, Q1 Hr x 4, Nettie Elm, MD .  senna-docusate (Senokot-S) tablet 1 tablet, 1 tablet, Oral, Daily, Harrie Foreman, MD, 1 tablet at 05/01/17 [phone] .  sodium chloride 0.9 % bolus 1,000 mL, 1,000 mL, Intravenous, Once, Nettie Elm, MD, Last Rate: 500 mL/hr at 05/01/17 1207, 1,000 mL at 05/01/17 1207 .  sodium chloride flush (NS) 0.9 % injection 3 mL, 3 mL, Intravenous, Q12H, Vaughan Basta, MD, 3 mL at 05/01/17 0922 .  Tdap (BOOSTRIX) injection 0.5 mL, 0.5 mL, Intramuscular, Once, Rifenbark, Neil, MD .  vancomycin (VANCOCIN) 1,500 mg in sodium chloride  0.9 % 500 mL IVPB, 1,500 mg, Intravenous, Once, Vaughan Basta, MD, Last Rate: 250 mL/hr at 05/01/17 1152, 1,500 mg at 05/01/17 1152  IMAGING    Dg Chest Port 1 View  Result Date: 05/01/2017 CLINICAL DATA:  Sepsis. History of congestive heart failure, COPD and coronary artery disease. EXAM: PORTABLE CHEST 1 VIEW COMPARISON:  Radiographs 04/30/2017 and 11/04/2016. FINDINGS: 0958 hour. Stable mild cardiomegaly and aortic atherosclerosis. There is new focal airspace disease at the right lung base without clear obscuration of the right hemidiaphragm or right heart border. This may be in the middle or lower lobe. The left lung is clear. There is no pneumothorax or significant pleural effusion. The bones appear unchanged. IMPRESSION: New right basilar infiltrate suspicious for pneumonia, possibly on the basis of aspiration. Electronically Signed   By: Richardean Sale M.D.   On: 05/01/2017 10:12       MICRO DATA: MRSA PCR  Urine  Blood Resp     ASSESSMENT/PLAN   76 years old gentleman with a past medical history significant for Parkinson's, coronary artery disease status post stenting, COPD, diabetes, CHF, CKD stage III,  glaucoma, dyslipidemia, restless leg syndrome, polyneuropathy, obstructive sleep apnea, ongoing tobacco abuse-smokes half a pack of cigarettes a day who was admitted to the hospital yesterday with syncope and was found to have anemia.  Transferred to the ICU for acute hypoxemic respiratory failure  Problem list Acute hypoxemic respiratory failure secondary to pneumonia Right lower lobe pneumonia Severe sepsis Lactic acidosis Recent syncope Anemia Guaiac positive stools Liver mass History of coronary artery disease status post stent placement Parkinson's COPD Diabetes CKD stage III Glaucoma Dyslipidemia Restless leg syndrome Polyneuropathy Obstructive sleep apnea Ongoing tobacco abuse-half a pack of cigarettes a day.  I think the patient's acute hypoxemic respiratory failure secondary to developing pneumonia.  Given that his FiO2 requirements have improved significantly and are down to 6 L of oxygen via nasal cannula, I do not think the patient has a PE.  I will DC the CT angios the chest that was ordered by the primary team.  Agree with vancomycin and Zosyn.   We will start the patient on methylprednisone 40 mg every 6 hours given that he has COPD. DC Dulera and Spiriva as I do not think the patient will be able to take it. Start duo nebs every 6 hours.  The patient's blood pressures have trended down over the last couple of hours.  The patient has severe sepsis and is likely developing shock. Patient was given 1 L normal saline bolus and a second liter is going again. We will bolus the patient according to 30 mL's per KG. Start normal saline at 125 mL's per hour. Vancomycin and Zosyn as above.  Blood cultures have been sent.  Follow blood cultures. Repeat lactic acid levels in 6 hours PICC line to be placed. If the blood pressure does not improve after bolus according to severe sepsis protocol, will start the patient on vasopressors.  ?  Cause for syncope.?  Carotid disease  as seen on the CT of the C-spine. Head CT scan was negative but showed scattered calcification at the carotid bifurcations bilaterally.  The patient should get a carotid ultrasound but this should be done when he is more hemodynamically stable.  Patient has guaiac positive stools with recent history of upper and lower endoscopy at [facility] GI is on board and is planning capsule endoscopy. Per GIs note, the patient was found to have a liver mass.  GI  is planning repeat imaging of liver lesion for further workup and diagnosis.  Will defer to GI. Continue PPI  Repeat hemoglobin levels this afternoon.  Since his last hemoglobin levels have been stable, he does not need serial hemoglobins after this.  Cardiology is on board.  Follow recommendations. Eliquis has been held given his anemia.  Cardiology is okay with it  Continue Lipitor Continue Sinemet Continue gabapentin Insulin sliding scale Continue Keppra Smoking cessation counseling when the patient is more awake.  PPI for stress ulcer prophylaxis as well as guaiac positive stool SCD for DVT prophylaxis Insulin sliding scale for glycemic control   I have personally obtained a history, examined the patient, evaluated laboratory and independently reviewed  imaging results, formulated the assessment and plan and placed orders.  The Patient requires high complexity decision making for assessment and support, frequent evaluation and titration of therapies, application of advanced monitoring technologies and extensive interpretation of multiple databases. Critical Care Time devoted to patient care services described in this note is 65 minutes.      Nettie Elm, M.D.  Pulmonary and Critical Care Medicine

## 2017-05-01 NOTE — Progress Notes (Signed)
CODE SEPSIS - PHARMACY COMMUNICATION  **Broad Spectrum Antibiotics should be administered within 1 hour of Sepsis diagnosis**  Time Code Sepsis Called/Page Received: 0940  Antibiotics Ordered: Zosyn/Vancomycin   Time of 1st antibiotic administration: 1100 - antibiotics held until blood cultures obtained per e-link  Additional action taken by pharmacy: Zosyn hand delivered to nurse by pharmacy.   If necessary, Name of Provider/Nurse Contacted: Dr. Anselm Jungling & ICU nurse     Charlett Nose ,PharmD Clinical Pharmacist  05/01/2017  4:30 PM

## 2017-05-01 NOTE — Progress Notes (Signed)
[facility] responded to a PG for a rapid response. Pt was being assessed by the medical team on my arrival. [facility] inquired about family. No family available at this time. Pt transferred to IC-17. CH will monitor.    05/01/17 1000  Clinical Encounter Type  Visited With Patient  Visit Type Initial;Code (RR)  Referral From Nurse  Consult/Referral To Chaplain  Spiritual Encounters  Spiritual Needs Prayer;Emotional

## 2017-05-01 NOTE — Care Management (Signed)
RNCM notified PACE team of patient status ICU Stepdown unit.

## 2017-05-01 NOTE — Progress Notes (Signed)
[place] [facility] [place] NAME: Jimmy Hill    MR#:  [id number]  DATE OF BIRTH:  09-23-1940  SUBJECTIVE:  CHIEF COMPLAINT:   Chief Complaint  Patient presents with  . Fall   Pt always have orthostatic blood pressure drop. Had some GI bleed and EGD and colonoscope done last week at [facility], had syncopal episode in bathroom. Also noted injury on head due to fall and guiac positive stool and anemia.  REVIEW OF SYSTEMS:  CONSTITUTIONAL: No fever,positive for fatigue or weakness.  EYES: No blurred or double vision.  EARS, NOSE, AND THROAT: No tinnitus or ear pain.  RESPIRATORY: No cough, shortness of breath, wheezing or hemoptysis.  CARDIOVASCULAR: No chest pain, orthopnea, edema.  GASTROINTESTINAL: No nausea, vomiting, diarrhea or abdominal pain.  GENITOURINARY: No dysuria, hematuria.  ENDOCRINE: No polyuria, nocturia,  HEMATOLOGY: No anemia, easy bruising or bleeding SKIN: No rash or lesion. MUSCULOSKELETAL: No joint pain or arthritis.   NEUROLOGIC: No tingling, numbness, weakness.  PSYCHIATRY: No anxiety or depression.   ROS  DRUG ALLERGIES:   Allergies  Allergen Reactions  . Cheese     Patient doesn't want any cheese    VITALS:  Blood pressure 107/62, pulse 80, temperature 99.5 F (37.5 C), temperature source Axillary, resp. rate 14, height 5\' 8"  (1.727 m), weight 88.5 kg (195 lb 3.2 oz), SpO2 100 %.  PHYSICAL EXAMINATION:  GENERAL:  76 y.o.-year-old patient lying in the bed with no acute distress.  EYES: Pupils equal, round, reactive to light and accommodation. No scleral icterus. Extraocular muscles intact.  HEENT: Head have some sign of trauma on forehead, normocephalic. Oropharynx and nasopharynx clear.  NECK:  Supple, no jugular venous distention. No thyroid enlargement, no tenderness.  LUNGS: Normal breath sounds bilaterally, no wheezing, rales,rhonchi or crepitation. No use of accessory muscles of respiration.  CARDIOVASCULAR:  S1, S2 normal. No murmurs, rubs, or gallops.  ABDOMEN: Soft, nontender, nondistended. Bowel sounds present. No organomegaly or mass.  EXTREMITIES: No pedal edema, cyanosis, or clubbing.  NEUROLOGIC: Cranial nerves II through XII are intact. Muscle strength 4-5/5 in all extremities. Sensation intact. Gait not checked.  PSYCHIATRIC: The patient is alert and oriented x 3.  SKIN: No obvious rash, lesion, or ulcer.   Physical Exam LABORATORY PANEL:   CBC Recent Labs  Lab 05/01/17 1031  WBC 17.4*  HGB 9.2*  HCT 30.9*  PLT 271   ------------------------------------------------------------------------------------------------------------------  Chemistries  Recent Labs  Lab 05/01/17 0100 05/01/17 1031  NA 137 138  K 3.1* 3.4*  CL 101 100*  CO2 29 28  GLUCOSE 143* 121*  BUN 12 11  CREATININE 0.70 0.76  CALCIUM 7.7* 8.0*  MG 1.7  --   AST  --  24  ALT  --  <5*  ALKPHOS  --  70  BILITOT  --  1.0   ------------------------------------------------------------------------------------------------------------------  Cardiac Enzymes Recent Labs  Lab 04/30/17 0417  TROPONINI <0.03   ------------------------------------------------------------------------------------------------------------------  RADIOLOGY:  Dg Chest 2 View  Result Date: 04/30/2017 CLINICAL DATA:  Status post fall, with concern for chest injury. EXAM: CHEST  2 VIEW COMPARISON:  Chest radiograph performed 11/04/2016 FINDINGS: The lungs are well-aerated and clear. There is no evidence of focal opacification, pleural effusion or pneumothorax. The heart is normal in size; the mediastinal contour is within normal limits. No acute osseous abnormalities are seen. IMPRESSION: No acute cardiopulmonary process seen. No displaced rib fractures identified. Electronically Signed   By: Francoise Schaumann.D.  On: 04/30/2017 04:42   Ct Head Wo Contrast  Result Date: 04/30/2017 CLINICAL DATA:  Status post fall, with frontal  scalp hematoma, headache and left-sided neck pain. Patient on blood thinner. EXAM: CT HEAD WITHOUT CONTRAST CT CERVICAL SPINE WITHOUT CONTRAST TECHNIQUE: Multidetector CT imaging of the head and cervical spine was performed following the standard protocol without intravenous contrast. Multiplanar CT image reconstructions of the cervical spine were also generated. COMPARISON:  CT of the head performed 12/31/2016, and MRI/MRA of the brain and MRI of the cervical spine performed 03/03/2016 FINDINGS: CT HEAD FINDINGS Brain: No evidence of acute infarction, hemorrhage, hydrocephalus, extra-axial collection or mass lesion/mass effect. Prominence of the ventricles and sulci reflects moderate cortical volume loss. Cerebellar atrophy is noted. Scattered periventricular and subcortical white matter change likely reflects small vessel ischemic microangiopathy. The brainstem and fourth ventricle are within normal limits. The basal ganglia are unremarkable in appearance. The cerebral hemispheres demonstrate grossly normal gray-white differentiation. No mass effect or midline shift is seen. Vascular: No hyperdense vessel or unexpected calcification. Skull: There is no evidence of fracture; visualized osseous structures are unremarkable in appearance. Sinuses/Orbits: The visualized portions of the orbits are within normal limits. The paranasal sinuses and mastoid air cells are well-aerated. Other: Mild soft tissue swelling is noted overlying the left frontal calvarium. CT CERVICAL SPINE FINDINGS Alignment: Normal. Skull base and vertebrae: No acute fracture. No primary bone lesion or focal pathologic process. There is incomplete fusion of the posterior arch of C1. Soft tissues and spinal canal: No prevertebral fluid or swelling. No visible canal hematoma. Disc levels: Multilevel disc space narrowing is noted along the cervical spine, with scattered anterior and posterior disc osteophyte complexes, and underlying facet disease.  Mild degenerative change is noted about the dens. Upper chest: The visualized lung apices are clear. The visualized portions of the thyroid gland are unremarkable. Scattered calcification is noted at the carotid bifurcations bilaterally. Other: No additional soft tissue abnormalities are seen. IMPRESSION: 1. No evidence of traumatic intracranial injury or fracture. 2. No evidence of fracture or subluxation along the cervical spine. 3. Mild soft tissue swelling overlying the left frontal calvarium. 4. Moderate cortical volume loss and scattered small vessel ischemic microangiopathy. 5. Mild degenerative change along the cervical spine. 6. Scattered calcification at the carotid bifurcations bilaterally. Carotid ultrasound is recommended for further evaluation, when and as deemed clinically appropriate. Electronically Signed   By: Garald Balding M.D.   On: 04/30/2017 04:40   Ct Cervical Spine Wo Contrast  Result Date: 04/30/2017 CLINICAL DATA:  Status post fall, with frontal scalp hematoma, headache and left-sided neck pain. Patient on blood thinner. EXAM: CT HEAD WITHOUT CONTRAST CT CERVICAL SPINE WITHOUT CONTRAST TECHNIQUE: Multidetector CT imaging of the head and cervical spine was performed following the standard protocol without intravenous contrast. Multiplanar CT image reconstructions of the cervical spine were also generated. COMPARISON:  CT of the head performed 12/31/2016, and MRI/MRA of the brain and MRI of the cervical spine performed 03/03/2016 FINDINGS: CT HEAD FINDINGS Brain: No evidence of acute infarction, hemorrhage, hydrocephalus, extra-axial collection or mass lesion/mass effect. Prominence of the ventricles and sulci reflects moderate cortical volume loss. Cerebellar atrophy is noted. Scattered periventricular and subcortical white matter change likely reflects small vessel ischemic microangiopathy. The brainstem and fourth ventricle are within normal limits. The basal ganglia are unremarkable  in appearance. The cerebral hemispheres demonstrate grossly normal gray-white differentiation. No mass effect or midline shift is seen. Vascular: No hyperdense vessel or unexpected calcification. Skull:  There is no evidence of fracture; visualized osseous structures are unremarkable in appearance. Sinuses/Orbits: The visualized portions of the orbits are within normal limits. The paranasal sinuses and mastoid air cells are well-aerated. Other: Mild soft tissue swelling is noted overlying the left frontal calvarium. CT CERVICAL SPINE FINDINGS Alignment: Normal. Skull base and vertebrae: No acute fracture. No primary bone lesion or focal pathologic process. There is incomplete fusion of the posterior arch of C1. Soft tissues and spinal canal: No prevertebral fluid or swelling. No visible canal hematoma. Disc levels: Multilevel disc space narrowing is noted along the cervical spine, with scattered anterior and posterior disc osteophyte complexes, and underlying facet disease. Mild degenerative change is noted about the dens. Upper chest: The visualized lung apices are clear. The visualized portions of the thyroid gland are unremarkable. Scattered calcification is noted at the carotid bifurcations bilaterally. Other: No additional soft tissue abnormalities are seen. IMPRESSION: 1. No evidence of traumatic intracranial injury or fracture. 2. No evidence of fracture or subluxation along the cervical spine. 3. Mild soft tissue swelling overlying the left frontal calvarium. 4. Moderate cortical volume loss and scattered small vessel ischemic microangiopathy. 5. Mild degenerative change along the cervical spine. 6. Scattered calcification at the carotid bifurcations bilaterally. Carotid ultrasound is recommended for further evaluation, when and as deemed clinically appropriate. Electronically Signed   By: Garald Balding M.D.   On: 04/30/2017 04:40   Dg Chest Port 1 View  Result Date: 05/01/2017 CLINICAL DATA:  PICC line  placement. EXAM: PORTABLE CHEST 1 VIEW COMPARISON:  05/01/2017 FINDINGS: Right-sided PICC line terminates at the expected location of cavoatrial junction. No evidence of pneumothorax. Cardiomediastinal silhouette is enlarged. Calcific atherosclerotic disease of the aorta. Mediastinal contours appear intact. Persistent airspace opacity in the right lower lobe. Osseous structures are without acute abnormality. Soft tissues are grossly normal. IMPRESSION: Status post PICC line placement.  No evidence of pneumothorax. Persistent right lower lobe airspace disease. Electronically Signed   By: Fidela Salisbury M.D.   On: 05/01/2017 14:33   Dg Chest Port 1 View  Result Date: 05/01/2017 CLINICAL DATA:  Sepsis. History of congestive heart failure, COPD and coronary artery disease. EXAM: PORTABLE CHEST 1 VIEW COMPARISON:  Radiographs 04/30/2017 and 11/04/2016. FINDINGS: 0958 hour. Stable mild cardiomegaly and aortic atherosclerosis. There is new focal airspace disease at the right lung base without clear obscuration of the right hemidiaphragm or right heart border. This may be in the middle or lower lobe. The left lung is clear. There is no pneumothorax or significant pleural effusion. The bones appear unchanged. IMPRESSION: New right basilar infiltrate suspicious for pneumonia, possibly on the basis of aspiration. Electronically Signed   By: Richardean Sale M.D.   On: 05/01/2017 10:12    ASSESSMENT AND PLAN:   Active Problems:   Syncope   Sepsis ([place])  1.  Syncope: Multifactorial; etiologies include symptomatic anemia, Parkinson's disease and/or arrhythmia.  The patient has history of falls, orthostatic hypotension as well as atrial fibrillation.  He has been on anticoagulation and is Hemoccult positive.  He underwent colonoscopy 2 weeks ago at [facility] which showed some bleeding polyps.  He denies palpitations or chest pain.    Cardiologist suggest no further work ups.   No anticoagulants for now, echo, iV  fluids. 2.  Anemia: hold the patient's Eliquis and aspirin.  Consult gastroenterology  3.  Coronary artery disease: Stable; troponin negative and no EKG evidence of ischemia. 4.  Parkinson's disease: Makes patient more prone to falls.  Continue Sinemet 5.  Atrial fibrillation: Rate controlled; continue diltiazem. 6.  Diabetes mellitus type 2: Hold metformin.  Sliding scale insulin while hospitalized 7.  Hyperlipidemia: Continue statin therapy 8.  Seizure disorder: Continue Keppra 9.  COPD: Continue inhaled corticosteroid as well as Spiriva.  Albuterol as needed. 10.  DVT prophylaxis: SCDs 11.  GI prophylaxis: Continue PPI per home regimen   All the records are reviewed and case discussed with Care Management/Social Workerr. Management plans discussed with the patient, family and they are in agreement.  CODE STATUS: full.  TOTAL TIME TAKING CARE OF THIS PATIENT: 35 minutes.     POSSIBLE D/C IN 1-2 DAYS, DEPENDING ON CLINICAL CONDITION.   Vaughan Basta M.D on 05/01/2017   Between 7am to 6pm - Pager - [phone]  After 6pm go to [email or web address] - password [email or web address] [facility]  Office  [phone]  CC: Primary care physician; [employer], [place]  Note: This dictation was prepared with Sales executive along with smaller Company secretary. Any transcriptional errors that result from this process are unintentional.

## 2017-05-01 NOTE — Progress Notes (Signed)
[place] for electrolyte management   Pharmacy consulted for electrolyte management for 76 yo male ICU patient presenting with hypokalemia.   Plan:  Patient received potassium 22mEq IV Q1hr x 4 doses and magnesium 2g IV x 1. Will recheck electrolytes with am labs. Goal potassium > 4 and goal magnesium > 2.   Allergies  Allergen Reactions  . Cheese     Patient doesn't want any cheese    Patient Measurements: Height: 5\' 8"  (172.7 cm) Weight: 195 lb 3.2 oz (88.5 kg) IBW/kg (Calculated) : 68.4  Vital Signs: Temp: 99.9 F (37.7 C) (12/14 1300) Temp Source: Axillary (12/14 1300) BP: 99/57 (12/14 1400) Pulse Rate: 88 (12/14 1400) Intake/Output from previous day: 12/13 0701 - 12/14 0700 In: 1200 [I.V.:1200] Out: 800 [Urine:800] Intake/Output from this shift: Total I/O In: -  Out: 625 [Urine:625]  Labs: Recent Labs    04/30/17 0417  05/01/17 0100 05/01/17 0904 05/01/17 1031  WBC 8.2  --   --   --  17.4*  HGB 8.8*   < > 8.1* 9.0* 9.2*  HCT 29.1*  --   --   --  30.9*  PLT 257  --   --   --  271  APTT  --   --   --   --  35  CREATININE 0.81  --  0.70  --  0.76  MG 1.1*  --  1.7  --   --   PHOS  --   --   --   --  2.5  ALBUMIN 3.0*  --   --   --  3.2*  PROT 6.2*  --   --   --  6.5  AST 24  --   --   --  24  ALT 6*  --   --   --  <5*  ALKPHOS 66  --   --   --  70  BILITOT 0.6  --   --   --  1.0   < > = values in this interval not displayed.   Estimated Creatinine Clearance: 84.9 mL/min (by C-G formula based on SCr of 0.76 mg/dL).   Pharmacy will continue to monitor and adjust per consult.   Nikola Marone L 05/01/2017,4:33 PM

## 2017-05-01 NOTE — Progress Notes (Signed)
Pharmacy Antibiotic Note  Jimmy Hill is a 76 y.o. male admitted on 04/30/2017 with sepsis.  Patient admitted to ICU from 2A due to rapid response. Pharmacy has been consulted for vancomycin and Zosyn dosing.   Plan: Will start Zosyn EI 3.375g IV Q8hr.   Patient ordered vancomycin 1500mg  IV x 1, will continue vancomycin 1250mg  IV Q12hr for goal trough of 15-20. Will obtain trough prior to am dose on 12/16.   Height: 5\' 8"  (172.7 cm) Weight: 195 lb 3.2 oz (88.5 kg) IBW/kg (Calculated) : 68.4  Temp (24hrs), Avg:100.1 F (37.8 C), Min:98.3 F (36.8 C), Max:103 F (39.4 C)  Recent Labs  Lab 04/30/17 0417 05/01/17 0100 05/01/17 1031 05/01/17 1222  WBC 8.2  --  17.4*  --   CREATININE 0.81 0.70 0.76  --   LATICACIDVEN  --   --  2.6* 2.8*    Estimated Creatinine Clearance: 84.9 mL/min (by C-G formula based on SCr of 0.76 mg/dL).    Allergies  Allergen Reactions  . Cheese     Patient doesn't want any cheese    Antimicrobials this admission: Vancomycin 12/14 >>  Zosyn 12/14 >>   Dose adjustments this admission: N/A  Microbiology results: 12/14 BCx: sent  12/14 MRSA PCR: negative   Thank you for allowing pharmacy to be a part of this patient's care.  Simpson,Michael L 05/01/2017 4:22 PM

## 2017-05-02 ENCOUNTER — Encounter: Payer: Self-pay | Admitting: Radiology

## 2017-05-02 ENCOUNTER — Inpatient Hospital Stay: Payer: Medicare (Managed Care)

## 2017-05-02 LAB — BLOOD GAS, ARTERIAL
ALLENS TEST (PASS/FAIL): POSITIVE — AB
Acid-Base Excess: 5.6 mmol/L — ABNORMAL HIGH (ref 0.0–2.0)
Bicarbonate: 30.5 mmol/L — ABNORMAL HIGH (ref 20.0–28.0)
FIO2: 50
O2 SAT: 94.3 %
PATIENT TEMPERATURE: 37
PCO2 ART: 46 mmHg (ref 32.0–48.0)
pH, Arterial: 7.43 (ref 7.350–7.450)
pO2, Arterial: 70 mmHg — ABNORMAL LOW (ref 83.0–108.0)

## 2017-05-02 LAB — CELIAC DISEASE PANEL
Endomysial Ab, IgA: NEGATIVE
IgA: 345 mg/dL (ref 61–437)
Tissue Transglutaminase Ab, IgA: <2 U/mL (ref 0–3)

## 2017-05-02 LAB — COMPREHENSIVE METABOLIC PANEL
ALBUMIN: 2.5 g/dL — AB (ref 3.5–5.0)
ALK PHOS: 48 U/L (ref 38–126)
ALT: 6 U/L — ABNORMAL LOW (ref 17–63)
ALT: 5 U/L — AB (ref 17–63)
ANION GAP: 8 (ref 5–15)
AST: 34 U/L (ref 15–41)
AST: 31 U/L (ref 15–41)
Albumin: 2.2 g/dL — ABNORMAL LOW (ref 3.5–5.0)
Alkaline Phosphatase: 45 U/L (ref 38–126)
Anion gap: 10 (ref 5–15)
BUN: 11 mg/dL (ref 6–20)
BUN: 12 mg/dL (ref 6–20)
CALCIUM: 6.6 mg/dL — AB (ref 8.9–10.3)
CALCIUM: 7.4 mg/dL — AB (ref 8.9–10.3)
CHLORIDE: 109 mmol/L (ref 101–111)
CO2: 22 mmol/L (ref 22–32)
CO2: 22 mmol/L (ref 22–32)
CREATININE: 0.79 mg/dL (ref 0.61–1.24)
CREATININE: 0.86 mg/dL (ref 0.61–1.24)
Chloride: 103 mmol/L (ref 101–111)
GFR calc Af Amer: >60 mL/min (ref >60)
GFR calc non Af Amer: >60 mL/min (ref >60)
GLUCOSE: 262 mg/dL — AB (ref 65–99)
Glucose, Bld: 181 mg/dL — ABNORMAL HIGH (ref 65–99)
Potassium: 3 mmol/L — ABNORMAL LOW (ref 3.5–5.1)
Potassium: 3.7 mmol/L (ref 3.5–5.1)
SODIUM: 139 mmol/L (ref 135–145)
SODIUM: 135 mmol/L (ref 135–145)
Total Bilirubin: 0.7 mg/dL (ref 0.3–1.2)
Total Bilirubin: 1.2 mg/dL (ref 0.3–1.2)
Total Protein: 4.6 g/dL — ABNORMAL LOW (ref 6.5–8.1)
Total Protein: 5.3 g/dL — ABNORMAL LOW (ref 6.5–8.1)

## 2017-05-02 LAB — PROCALCITONIN: PROCALCITONIN: 1.02 ng/mL

## 2017-05-02 LAB — GLUCOSE, CAPILLARY
GLUCOSE-CAPILLARY: 215 mg/dL — AB (ref 65–99)
Glucose-Capillary: 175 mg/dL — ABNORMAL HIGH (ref 65–99)
Glucose-Capillary: 255 mg/dL — ABNORMAL HIGH (ref 65–99)
Glucose-Capillary: 227 mg/dL — ABNORMAL HIGH (ref 65–99)

## 2017-05-02 LAB — CBC
HCT: 23.6 % — ABNORMAL LOW (ref 40.0–52.0)
HEMOGLOBIN: 6.7 g/dL — AB (ref 13.0–18.0)
MCH: 20.1 pg — AB (ref 26.0–34.0)
MCHC: 28.6 g/dL — ABNORMAL LOW (ref 32.0–36.0)
MCV: 70.3 fL — AB (ref 80.0–100.0)
PLATELETS: 213 10*3/uL (ref 150–440)
RBC: 3.36 MIL/uL — AB (ref 4.40–5.90)
RDW: 17.8 % — ABNORMAL HIGH (ref 11.5–14.5)
WBC: 18.2 10*3/uL — AB (ref 3.8–10.6)

## 2017-05-02 LAB — HEMOGLOBIN AND HEMATOCRIT, BLOOD
HCT: 25.5 % — ABNORMAL LOW (ref 40.0–52.0)
HCT: 30.4 % — ABNORMAL LOW (ref 40.0–52.0)
HEMATOCRIT: 24.3 % — AB (ref 40.0–52.0)
HEMOGLOBIN: 7.4 g/dL — AB (ref 13.0–18.0)
Hemoglobin: 7.1 g/dL — ABNORMAL LOW (ref 13.0–18.0)
Hemoglobin: 9.2 g/dL — ABNORMAL LOW (ref 13.0–18.0)

## 2017-05-02 LAB — PHOSPHORUS
Phosphorus: 2.8 mg/dL (ref 2.5–4.6)
Phosphorus: 2.3 mg/dL — ABNORMAL LOW (ref 2.5–4.6)

## 2017-05-02 LAB — LACTIC ACID, PLASMA
LACTIC ACID, VENOUS: 4.5 mmol/L — AB (ref 0.5–1.9)
LACTIC ACID, VENOUS: 4.1 mmol/L — AB (ref 0.5–1.9)
LACTIC ACID, VENOUS: 3.8 mmol/L — AB (ref 0.5–1.9)
LACTIC ACID, VENOUS: 4.4 mmol/L — AB (ref 0.5–1.9)

## 2017-05-02 LAB — HEMOGLOBIN A1C
Hgb A1c MFr Bld: 7.9 % — ABNORMAL HIGH (ref 4.8–5.6)
Mean Plasma Glucose: 180 mg/dL

## 2017-05-02 LAB — MAGNESIUM
MAGNESIUM: 1.2 mg/dL — AB (ref 1.7–2.4)
MAGNESIUM: 1.8 mg/dL (ref 1.7–2.4)
Magnesium: 1.7 mg/dL (ref 1.7–2.4)

## 2017-05-02 LAB — POTASSIUM: Potassium: 3.7 mmol/L (ref 3.5–5.1)

## 2017-05-02 MED ORDER — MAGNESIUM SULFATE 4 GM/100ML IV SOLN
4.0000 g | Freq: Once | INTRAVENOUS | Status: AC
  Administered 2017-05-02: 4 g via INTRAVENOUS
  Filled 2017-05-02: qty 100

## 2017-05-02 MED ORDER — INSULIN ASPART 100 UNIT/ML ~~LOC~~ SOLN
0.0000 [IU] | Freq: Every day | SUBCUTANEOUS | Status: DC
  Administered 2017-05-02: 8 [IU] via SUBCUTANEOUS
  Administered 2017-05-03: 7 [IU] via SUBCUTANEOUS
  Administered 2017-05-04: 4 [IU] via SUBCUTANEOUS
  Administered 2017-05-06: 3 [IU] via SUBCUTANEOUS
  Filled 2017-05-02 (×3): qty 1

## 2017-05-02 MED ORDER — INSULIN ASPART 100 UNIT/ML ~~LOC~~ SOLN
0.0000 [IU] | Freq: Three times a day (TID) | SUBCUTANEOUS | Status: DC
  Administered 2017-05-03: 11 [IU] via SUBCUTANEOUS
  Administered 2017-05-03: 4 [IU] via SUBCUTANEOUS
  Administered 2017-05-03: 7 [IU] via SUBCUTANEOUS
  Administered 2017-05-04: 3 [IU] via SUBCUTANEOUS
  Administered 2017-05-04 (×2): 7 [IU] via SUBCUTANEOUS
  Administered 2017-05-05 – 2017-05-06 (×3): 3 [IU] via SUBCUTANEOUS
  Administered 2017-05-06: 4 [IU] via SUBCUTANEOUS
  Filled 2017-05-02 (×10): qty 1

## 2017-05-02 MED ORDER — SODIUM CHLORIDE 0.9 % IV BOLUS (SEPSIS)
1000.0000 mL | Freq: Once | INTRAVENOUS | Status: AC
  Administered 2017-05-02: 1000 mL via INTRAVENOUS

## 2017-05-02 MED ORDER — STERILE WATER FOR INJECTION IJ SOLN
INTRAMUSCULAR | Status: AC
  Administered 2017-05-02: 18:00:00
  Filled 2017-05-02: qty 10

## 2017-05-02 MED ORDER — IOPAMIDOL (ISOVUE-370) INJECTION 76%
100.0000 mL | Freq: Once | INTRAVENOUS | Status: AC | PRN
  Administered 2017-05-02: 100 mL via INTRAVENOUS

## 2017-05-02 MED ORDER — ALTEPLASE 2 MG IJ SOLR
2.0000 mg | Freq: Once | INTRAMUSCULAR | Status: AC
  Administered 2017-05-02: 2 mg
  Filled 2017-05-02: qty 2

## 2017-05-02 MED ORDER — INSULIN REGULAR HUMAN 100 UNIT/ML IJ SOLN: 8.0000 [IU] | Freq: Once | INTRAMUSCULAR | Status: AC

## 2017-05-02 MED ORDER — SODIUM CHLORIDE 0.9 % IV SOLN
Freq: Once | INTRAVENOUS | Status: AC
  Administered 2017-05-02: 16:00:00 via INTRAVENOUS

## 2017-05-02 MED ORDER — POTASSIUM CHLORIDE CRYS ER 20 MEQ PO TBCR
40.0000 meq | EXTENDED_RELEASE_TABLET | Freq: Once | ORAL | Status: AC
  Administered 2017-05-02: 40 meq via ORAL
  Filled 2017-05-02: qty 2

## 2017-05-02 MED ORDER — POTASSIUM CHLORIDE 20 MEQ PO PACK
60.0000 meq | PACK | Freq: Once | ORAL | Status: AC
  Administered 2017-05-02: 60 meq via ORAL
  Filled 2017-05-02: qty 3

## 2017-05-02 MED ORDER — MAGNESIUM SULFATE 2 GM/50ML IV SOLN
2.0000 g | Freq: Once | INTRAVENOUS | Status: AC
  Administered 2017-05-02: 2 g via INTRAVENOUS
  Filled 2017-05-02: qty 50

## 2017-05-02 MED ORDER — POTASSIUM CHLORIDE 10 MEQ/100ML IV SOLN
10.0000 meq | INTRAVENOUS | Status: DC
  Filled 2017-05-02 (×4): qty 100

## 2017-05-02 MED ORDER — SODIUM CHLORIDE 0.9 % IV SOLN: 2.0000 g | Freq: Once | INTRAVENOUS | Status: DC

## 2017-05-02 NOTE — Progress Notes (Signed)
[facility] NAME: Jimmy Hill    MR#:  [id number]  DATE OF BIRTH:  08/19/1940  SUBJECTIVE:   Patient admitted to the hospital secondary to syncope/fall and noted to be hypotensive. Transferred to the ICU yesterday due to worsening respiratory failure with hypoxemia secondary to pneumonia. Patient also noted to have a right lower lobe pneumonia. Patient is also anemic but the source of the anemia is unclear.  REVIEW OF SYSTEMS:    Review of Systems  Constitutional: Negative for chills and fever.  HENT: Negative for congestion and tinnitus.   Eyes: Negative for blurred vision and double vision.  Respiratory: Negative for cough, shortness of breath and wheezing.   Cardiovascular: Negative for chest pain, orthopnea and PND.  Gastrointestinal: Negative for abdominal pain, diarrhea, nausea and vomiting.  Genitourinary: Negative for dysuria and hematuria.  Neurological: Negative for dizziness, sensory change and focal weakness.  All other systems reviewed and are negative.   Nutrition: Clear Liquid Tolerating Diet: Yes Tolerating PT: Await Eval.    DRUG ALLERGIES:   Allergies  Allergen Reactions  . Cheese     Patient doesn't want any cheese    VITALS:  Blood pressure 108/87, pulse 98, temperature 97.9 F (36.6 C), temperature source Oral, resp. rate 19, height '5\' 8"'  (1.727 m), weight 90.8 kg (200 lb 2.8 oz), SpO2 94 %.  PHYSICAL EXAMINATION:   Physical Exam  GENERAL:  76 y.o.-year-old patient lying in bed in no acute distress.  EYES: Pupils equal, round, reactive to light and accommodation. No scleral icterus. Extraocular muscles intact.  HEENT: Head atraumatic, normocephalic. Oropharynx and nasopharynx clear.  NECK:  Supple, no jugular venous distention. No thyroid enlargement, no tenderness.  LUNGS: Normal breath sounds bilaterally, no wheezing, rales, rhonchi. No use of accessory muscles of respiration.   CARDIOVASCULAR: S1, S2 normal. No murmurs, rubs, or gallops.  ABDOMEN: Soft, nontender, nondistended. Bowel sounds present. No organomegaly or mass.  EXTREMITIES: No cyanosis, clubbing or edema b/l.    NEUROLOGIC: Cranial nerves II through XII are intact. No focal Motor or sensory deficits b/l. Globally weak.  PSYCHIATRIC: The patient is alert and oriented x 3.  SKIN: No obvious rash, lesion, or ulcer.    LABORATORY PANEL:   CBC Recent Labs  Lab 05/02/17 0550 05/02/17 0816  WBC 18.2*  --   HGB 6.7* 7.4*  HCT 23.6* 25.5*  PLT 213  --    ------------------------------------------------------------------------------------------------------------------  Chemistries  Recent Labs  Lab 05/02/17 0550  NA 139  K 3.0*  CL 109  CO2 22  GLUCOSE 181*  BUN 11  CREATININE 0.79  CALCIUM 6.6*  MG 1.2*  AST 34  ALT 6*  ALKPHOS 45  BILITOT 0.7   ------------------------------------------------------------------------------------------------------------------  Cardiac Enzymes Recent Labs  Lab 04/30/17 0417  TROPONINI <0.03   ------------------------------------------------------------------------------------------------------------------  RADIOLOGY:  Dg Chest 1 View  Result Date: 05/02/2017 CLINICAL DATA:  76 year old male with sepsis.  Hypoxia. EXAM: CHEST 1 VIEW COMPARISON:  05/01/2017 and earlier. FINDINGS: Portable AP upright view at 0354 hours. Right PICC line remains in place. Tip at the lower SVC level. Continued confluent opacity at the right lung base which first appeared yesterday. Stable lung volumes. No other confluent pulmonary opacity. No pneumothorax, pulmonary edema or pleural effusion. Stable cardiac size and mediastinal contours. Visualized tracheal air column is within normal limits. IMPRESSION: 1. Persistent right lower lobe confluent opacity. Consider aspiration and/or pneumonia. 2. No new cardiopulmonary abnormality. Electronically Signed  By: Genevie Ann M.D.    On: 05/02/2017 06:46   Dg Chest Port 1 View  Result Date: 05/01/2017 CLINICAL DATA:  PICC line placement. EXAM: PORTABLE CHEST 1 VIEW COMPARISON:  05/01/2017 FINDINGS: Right-sided PICC line terminates at the expected location of cavoatrial junction. No evidence of pneumothorax. Cardiomediastinal silhouette is enlarged. Calcific atherosclerotic disease of the aorta. Mediastinal contours appear intact. Persistent airspace opacity in the right lower lobe. Osseous structures are without acute abnormality. Soft tissues are grossly normal. IMPRESSION: Status post PICC line placement.  No evidence of pneumothorax. Persistent right lower lobe airspace disease. Electronically Signed   By: Fidela Salisbury M.D.   On: 05/01/2017 14:33   Dg Chest Port 1 View  Result Date: 05/01/2017 CLINICAL DATA:  Sepsis. History of congestive heart failure, COPD and coronary artery disease. EXAM: PORTABLE CHEST 1 VIEW COMPARISON:  Radiographs 04/30/2017 and 11/04/2016. FINDINGS: 0958 hour. Stable mild cardiomegaly and aortic atherosclerosis. There is new focal airspace disease at the right lung base without clear obscuration of the right hemidiaphragm or right heart border. This may be in the middle or lower lobe. The left lung is clear. There is no pneumothorax or significant pleural effusion. The bones appear unchanged. IMPRESSION: New right basilar infiltrate suspicious for pneumonia, possibly on the basis of aspiration. Electronically Signed   By: Richardean Sale M.D.   On: 05/01/2017 10:12   Ct Angio Abd/pel W/ And/or W/o  Addendum Date: 05/02/2017   ADDENDUM REPORT: 05/02/2017 13:26 ADDENDUM: In the report, a small amount of free fluid about the spleen was mentioned. There is also small amount of free fluid in the left pericolic gutter extending towards the left side of the pelvis. There is also a small amount of free fluid adjacent to the left side of the dome of the bladder. Again, these findings are nonspecific. In  addition, there is no focal wall thickening of the colon or pneumatosis to suggest bowel ischemia. Electronically Signed   By: Marybelle Killings M.D.   On: 05/02/2017 13:26   Result Date: 05/02/2017 CLINICAL DATA:  Recent back surgery.  Drop in hemoglobin. EXAM: CTA ABDOMEN AND PELVIS wITHOUT AND WITH CONTRAST TECHNIQUE: Multidetector CT imaging of the abdomen and pelvis was performed using the standard protocol during bolus administration of intravenous contrast. Multiplanar reconstructed images and MIPs were obtained and reviewed to evaluate the vascular anatomy. CONTRAST:  185m ISOVUE-370 IOPAMIDOL (ISOVUE-370) INJECTION 76% COMPARISON:  None. FINDINGS: VASCULAR Aorta: Nonaneurysmal and patent. Scattered atherosclerotic calcifications throughout the aorta. Celiac: Patent.  Branch vessels patent. SMA: Atherosclerotic calcifications at the origin. No significant narrowing. Renals: There are atherosclerotic calcifications at the origins of both renal arteries. Significant narrowing at the origin of the right renal artery cannot be excluded. Left renal artery is patent. IMA: Moderate disease at the origin. Inflow: There are atherosclerotic calcifications throughout the iliac arterial distribution. No significant narrowing. Proximal Outflow: Right common femoral artery is patent with some plaque. There is some disease at the origin of the right superficial femoral artery. There is calcified and smooth plaque in the left common femoral artery without significant narrowing. Visualize left superficial femoral artery is patent. Veins: No obvious DVT. Review of the MIP images confirms the above findings. NON-VASCULAR Lower chest: Bibasilar patchy airspace opacities. Small bilateral pleural effusions. Hepatobiliary: The contour of the liver somewhat nodular. There is an ill-defined low-density lesion in the lateral segment of the left lobe of liver on image 15 of series 6. Nodular tiny lesion in the medial  segment of the  left lobe on image 13. Tiny right lobe lesion is present on image 34 of series 6. Gallbladder is somewhat distended but otherwise unremarkable. Pancreas: Unremarkable Spleen: Spleen is unremarkable. There is a small amount of fluid about the spleen. Adrenals/Urinary Tract: Multiple calculi present within the collecting system of the left kidney. Small hypodensities in both kidneys are nonspecific. There is a 4.3 cm simple cyst emanating from the lower pole of the right kidney anteriorly. Adrenal glands are within normal limits. Bladder is unremarkable. Stomach/Bowel: Prominent stool burden throughout the colon. There is no obvious mass throughout the length of the colon. Appendix is nonvisualized. No evidence of small-bowel obstruction. Stomach and duodenum are within normal limits. Lymphatic: No abnormal retroperitoneal adenopathy. Reproductive: Within normal limits. Other: Small amount of free fluid is present about the spleen. There is no evidence of retroperitoneal hemorrhage. Musculoskeletal: Postoperative changes from L2 through L5 fusion are noted. Mild T10 compression deformity has a chronic appearance. IMPRESSION: VASCULAR There is no evidence of retroperitoneal hemorrhage. Atherosclerotic changes throughout the abdomen and pelvis are noted. Significant narrowing at the origin of the right renal artery is suspected. Correlate with renal function and the presence of uncontrolled hypertension. NON-VASCULAR The contour of the liver is somewhat nodular suggesting early cirrhosis. There are multiple lesions within the liver. Given the above finding, malignancy is not excluded. MRI of the liver is recommended Small amount of free fluid in the abdomen. Bibasilar patchy airspace disease and small pleural effusions. Bilateral nephrolithiasis. Electronically Signed: By: Marybelle Killings M.D. On: 05/02/2017 11:18     ASSESSMENT AND PLAN:   76 year old male with past medical history of diabetes, depression, history  of coronary artery disease, COPD, CHF, restless leg syndrome, seizures, history of spinal stenosis who presented to the hospital due to syncopal episode and also noted to be anemic. Yesterday patient developed some hypotension and worsening respiratory failure and noted to have sepsis secondary to pneumonia. Transferred to the ICU.  1. Acute respiratory failure with hypoxia-secondary to suspected pneumonia. -Continue O2 supplementation which has been weaned down since yesterday. Continue IV antibiotics with vancomycin, Zosyn for the pneumonia. We will notify oxygen as tolerated.  2. Sepsis-patient met criteria given his hypotension, tachycardia, fever and elevated lactic acid and chest x-ray final status of pneumonia. -Continue IV fluids, broad-spectrum IV antibiotics with vancomycin, Zosyn. Follow cultures returned negative so far.  3. Anemia-etiology unclear. Patient did have guaiac positive stools.has had a recent gastroenterology workup done at [facility] which was negative for acute pathology. Seen by gastroenterology here and plan for capsule endoscopy. -Follow serial hemoglobin. Patient underwent a CT angiogram of his abdomen and pelvis today which was negative for acute pathology. Follow serial hemoglobin and transfuse as needed.  4. History of atrial fibrillation-currently rate controlled. Eliquis on hold due to the anemia.  5. History of Parkinson's disease-continue Sinemet.  6. Diabetes type 2 without complication-continue sliding scale insulin and follow blood sugars.  7. History of seizures-continue Keppra. No acute seizure type activity.  8. Glaucoma-continue Alphagan and timolol eyedrops.   All the records are reviewed and case discussed with Care Management/Social Worker. Management plans discussed with the patient, family and they are in agreement.  CODE STATUS: Full code  DVT Prophylaxis: Teds and SCDs  TOTAL TIME TAKING CARE OF THIS PATIENT: 30 minutes.   POSSIBLE D/C IN  2-3 DAYS, DEPENDING ON CLINICAL CONDITION.   Henreitta Leber M.D on 05/02/2017 at 2:05 PM  Between 7am to 6pm - Pager -  [phone]  After 6pm go to [email or web address] - [employer] [facility]  Office  [phone]  CC: Primary care physician; Inc, [place]

## 2017-05-02 NOTE — Progress Notes (Signed)
[facility] for electrolyte management   Pharmacy consulted for electrolyte management for 76 yo male ICU patient presenting with hypokalemia.   Plan:  K 3.0, Mag 1.2, Phos 2.8  Ca 6.6   Albumin 2.2   Albumin Corrected Ca 8.0 Current orders for Potassium 60 meq PO packet x1 and magnesium 4 g IV x 1 per MD.   Will recheck K and Mag at 1800. Will recheck electrolytes with am labs. Goal potassium > 4 and goal magnesium > 2.   Allergies  Allergen Reactions  . Cheese     Patient doesn't want any cheese    Patient Measurements: Height: 5\' 8"  (172.7 cm) Weight: 200 lb 2.8 oz (90.8 kg) IBW/kg (Calculated) : 68.4  Vital Signs: Temp: 97.7 F (36.5 C) (12/15 0800) Temp Source: Oral (12/15 0800) BP: 102/56 (12/15 0700) Pulse Rate: 94 (12/15 0700) Intake/Output from previous day: 12/14 0701 - 12/15 0700 In: 2225.4 [P.O.:2; I.V.:1923.4; IV Piggyback:300] Out: 2050 [Urine:2050] Intake/Output from this shift: Total I/O In: 350 [IV Piggyback:350] Out: -   Labs: Recent Labs    04/30/17 0417  05/01/17 0100  05/01/17 1031 05/01/17 1644 05/02/17 0550 05/02/17 0816  WBC 8.2  --   --   --  17.4*  --  18.2*  --   HGB 8.8*   < > 8.1*   < > 9.2* 8.3* 6.7* 7.4*  HCT 29.1*  --   --   --  30.9*  --  23.6* 25.5*  PLT 257  --   --   --  271  --  213  --   APTT  --   --   --   --  35  --   --   --   CREATININE 0.81  --  0.70  --  0.76  --  0.79  --   MG 1.1*  --  1.7  --   --   --  1.2*  --   PHOS  --   --   --   --  2.5  --  2.8  --   ALBUMIN 3.0*  --   --   --  3.2*  --  2.2*  --   PROT 6.2*  --   --   --  6.5  --  4.6*  --   AST 24  --   --   --  24  --  34  --   ALT 6*  --   --   --  <5*  --  6*  --   ALKPHOS 66  --   --   --  70  --  45  --   BILITOT 0.6  --   --   --  1.0  --  0.7  --    < > = values in this interval not displayed.   Estimated Creatinine Clearance: 86 mL/min (by C-G formula based on SCr of 0.79 mg/dL).   Pharmacy will  continue to monitor and adjust per consult.   Rashunda Passon A 05/02/2017,10:31 AM

## 2017-05-02 NOTE — Progress Notes (Signed)
[place] [facility] [place] NAME: Jimmy Hill    MR#:  [id number]  DATE OF BIRTH:  07-12-1940  SUBJECTIVE:  CHIEF COMPLAINT:   Chief Complaint  Patient presents with  . Fall   Pt always have orthostatic blood pressure drop. Had some GI bleed and EGD and colonoscope done last week at [facility], had syncopal episode in bathroom. Also noted injury on head due to fall and guiac positive stool and anemia. TOday morning responded to rapid response- as he had hypoxia, fever. Appears some confused. No bleeding episodes noted.  REVIEW OF SYSTEMS:  CONSTITUTIONAL: No fever,positive for fatigue or weakness.  EYES: No blurred or double vision.  EARS, NOSE, AND THROAT: No tinnitus or ear pain.  RESPIRATORY: No cough, have shortness of breath,no wheezing or hemoptysis.  CARDIOVASCULAR: No chest pain, orthopnea, edema.  GASTROINTESTINAL: No nausea, vomiting, diarrhea or abdominal pain.  GENITOURINARY: No dysuria, hematuria.  ENDOCRINE: No polyuria, nocturia,  HEMATOLOGY: No anemia, easy bruising or bleeding SKIN: No rash or lesion. MUSCULOSKELETAL: No joint pain or arthritis.   NEUROLOGIC: No tingling, numbness, weakness.  PSYCHIATRY: No anxiety or depression.   ROS  DRUG ALLERGIES:   Allergies  Allergen Reactions  . Cheese     Patient doesn't want any cheese    VITALS:  Blood pressure (!) 104/52, pulse 89, temperature 98.1 F (36.7 C), temperature source Oral, resp. rate 16, height 5\' 8"  (1.727 m), weight 88.5 kg (195 lb 3.2 oz), SpO2 99 %.  PHYSICAL EXAMINATION:  GENERAL:  76 y.o.-year-old patient lying in the bed with no acute distress.  EYES: Pupils equal, round, reactive to light and accommodation. No scleral icterus. Extraocular muscles intact.  HEENT: Head have some sign of trauma on forehead, normocephalic. Oropharynx and nasopharynx clear.  NECK:  Supple, no jugular venous distention. No thyroid enlargement, no tenderness.  LUNGS: Normal breath  sounds bilaterally, no wheezing, have crepitation. positive use of accessory muscles of respiration. On NRBM. CARDIOVASCULAR: S1, S2 normal. No murmurs, rubs, or gallops.  ABDOMEN: Soft, nontender, nondistended. Bowel sounds present. No organomegaly or mass.  EXTREMITIES: No pedal edema, cyanosis, or clubbing.  NEUROLOGIC: Cranial nerves II through XII are intact. Muscle strength 4-5/5 in all extremities. Sensation intact. Gait not checked.  PSYCHIATRIC: The patient is alert and oriented x 2.  SKIN: No obvious rash, lesion, or ulcer.   Physical Exam LABORATORY PANEL:   CBC Recent Labs  Lab 05/01/17 1031 05/01/17 1644  WBC 17.4*  --   HGB 9.2* 8.3*  HCT 30.9*  --   PLT 271  --    ------------------------------------------------------------------------------------------------------------------  Chemistries  Recent Labs  Lab 05/01/17 0100 05/01/17 1031  NA 137 138  K 3.1* 3.4*  CL 101 100*  CO2 29 28  GLUCOSE 143* 121*  BUN 12 11  CREATININE 0.70 0.76  CALCIUM 7.7* 8.0*  MG 1.7  --   AST  --  24  ALT  --  <5*  ALKPHOS  --  70  BILITOT  --  1.0   ------------------------------------------------------------------------------------------------------------------  Cardiac Enzymes Recent Labs  Lab 04/30/17 0417  TROPONINI <0.03   ------------------------------------------------------------------------------------------------------------------  RADIOLOGY:  Dg Chest 2 View  Result Date: 04/30/2017 CLINICAL DATA:  Status post fall, with concern for chest injury. EXAM: CHEST  2 VIEW COMPARISON:  Chest radiograph performed 11/04/2016 FINDINGS: The lungs are well-aerated and clear. There is no evidence of focal opacification, pleural effusion or pneumothorax. The heart is normal in size; the mediastinal  contour is within normal limits. No acute osseous abnormalities are seen. IMPRESSION: No acute cardiopulmonary process seen. No displaced rib fractures identified.  Electronically Signed   By: Garald Balding M.D.   On: 04/30/2017 04:42   Ct Head Wo Contrast  Result Date: 04/30/2017 CLINICAL DATA:  Status post fall, with frontal scalp hematoma, headache and left-sided neck pain. Patient on blood thinner. EXAM: CT HEAD WITHOUT CONTRAST CT CERVICAL SPINE WITHOUT CONTRAST TECHNIQUE: Multidetector CT imaging of the head and cervical spine was performed following the standard protocol without intravenous contrast. Multiplanar CT image reconstructions of the cervical spine were also generated. COMPARISON:  CT of the head performed 12/31/2016, and MRI/MRA of the brain and MRI of the cervical spine performed 03/03/2016 FINDINGS: CT HEAD FINDINGS Brain: No evidence of acute infarction, hemorrhage, hydrocephalus, extra-axial collection or mass lesion/mass effect. Prominence of the ventricles and sulci reflects moderate cortical volume loss. Cerebellar atrophy is noted. Scattered periventricular and subcortical white matter change likely reflects small vessel ischemic microangiopathy. The brainstem and fourth ventricle are within normal limits. The basal ganglia are unremarkable in appearance. The cerebral hemispheres demonstrate grossly normal gray-white differentiation. No mass effect or midline shift is seen. Vascular: No hyperdense vessel or unexpected calcification. Skull: There is no evidence of fracture; visualized osseous structures are unremarkable in appearance. Sinuses/Orbits: The visualized portions of the orbits are within normal limits. The paranasal sinuses and mastoid air cells are well-aerated. Other: Mild soft tissue swelling is noted overlying the left frontal calvarium. CT CERVICAL SPINE FINDINGS Alignment: Normal. Skull base and vertebrae: No acute fracture. No primary bone lesion or focal pathologic process. There is incomplete fusion of the posterior arch of C1. Soft tissues and spinal canal: No prevertebral fluid or swelling. No visible canal hematoma. Disc  levels: Multilevel disc space narrowing is noted along the cervical spine, with scattered anterior and posterior disc osteophyte complexes, and underlying facet disease. Mild degenerative change is noted about the dens. Upper chest: The visualized lung apices are clear. The visualized portions of the thyroid gland are unremarkable. Scattered calcification is noted at the carotid bifurcations bilaterally. Other: No additional soft tissue abnormalities are seen. IMPRESSION: 1. No evidence of traumatic intracranial injury or fracture. 2. No evidence of fracture or subluxation along the cervical spine. 3. Mild soft tissue swelling overlying the left frontal calvarium. 4. Moderate cortical volume loss and scattered small vessel ischemic microangiopathy. 5. Mild degenerative change along the cervical spine. 6. Scattered calcification at the carotid bifurcations bilaterally. Carotid ultrasound is recommended for further evaluation, when and as deemed clinically appropriate. Electronically Signed   By: Garald Balding M.D.   On: 04/30/2017 04:40   Ct Cervical Spine Wo Contrast  Result Date: 04/30/2017 CLINICAL DATA:  Status post fall, with frontal scalp hematoma, headache and left-sided neck pain. Patient on blood thinner. EXAM: CT HEAD WITHOUT CONTRAST CT CERVICAL SPINE WITHOUT CONTRAST TECHNIQUE: Multidetector CT imaging of the head and cervical spine was performed following the standard protocol without intravenous contrast. Multiplanar CT image reconstructions of the cervical spine were also generated. COMPARISON:  CT of the head performed 12/31/2016, and MRI/MRA of the brain and MRI of the cervical spine performed 03/03/2016 FINDINGS: CT HEAD FINDINGS Brain: No evidence of acute infarction, hemorrhage, hydrocephalus, extra-axial collection or mass lesion/mass effect. Prominence of the ventricles and sulci reflects moderate cortical volume loss. Cerebellar atrophy is noted. Scattered periventricular and subcortical  white matter change likely reflects small vessel ischemic microangiopathy. The brainstem and fourth ventricle are within  normal limits. The basal ganglia are unremarkable in appearance. The cerebral hemispheres demonstrate grossly normal gray-white differentiation. No mass effect or midline shift is seen. Vascular: No hyperdense vessel or unexpected calcification. Skull: There is no evidence of fracture; visualized osseous structures are unremarkable in appearance. Sinuses/Orbits: The visualized portions of the orbits are within normal limits. The paranasal sinuses and mastoid air cells are well-aerated. Other: Mild soft tissue swelling is noted overlying the left frontal calvarium. CT CERVICAL SPINE FINDINGS Alignment: Normal. Skull base and vertebrae: No acute fracture. No primary bone lesion or focal pathologic process. There is incomplete fusion of the posterior arch of C1. Soft tissues and spinal canal: No prevertebral fluid or swelling. No visible canal hematoma. Disc levels: Multilevel disc space narrowing is noted along the cervical spine, with scattered anterior and posterior disc osteophyte complexes, and underlying facet disease. Mild degenerative change is noted about the dens. Upper chest: The visualized lung apices are clear. The visualized portions of the thyroid gland are unremarkable. Scattered calcification is noted at the carotid bifurcations bilaterally. Other: No additional soft tissue abnormalities are seen. IMPRESSION: 1. No evidence of traumatic intracranial injury or fracture. 2. No evidence of fracture or subluxation along the cervical spine. 3. Mild soft tissue swelling overlying the left frontal calvarium. 4. Moderate cortical volume loss and scattered small vessel ischemic microangiopathy. 5. Mild degenerative change along the cervical spine. 6. Scattered calcification at the carotid bifurcations bilaterally. Carotid ultrasound is recommended for further evaluation, when and as deemed  clinically appropriate. Electronically Signed   By: Garald Balding M.D.   On: 04/30/2017 04:40   Dg Chest Port 1 View  Result Date: 05/01/2017 CLINICAL DATA:  PICC line placement. EXAM: PORTABLE CHEST 1 VIEW COMPARISON:  05/01/2017 FINDINGS: Right-sided PICC line terminates at the expected location of cavoatrial junction. No evidence of pneumothorax. Cardiomediastinal silhouette is enlarged. Calcific atherosclerotic disease of the aorta. Mediastinal contours appear intact. Persistent airspace opacity in the right lower lobe. Osseous structures are without acute abnormality. Soft tissues are grossly normal. IMPRESSION: Status post PICC line placement.  No evidence of pneumothorax. Persistent right lower lobe airspace disease. Electronically Signed   By: Fidela Salisbury M.D.   On: 05/01/2017 14:33   Dg Chest Port 1 View  Result Date: 05/01/2017 CLINICAL DATA:  Sepsis. History of congestive heart failure, COPD and coronary artery disease. EXAM: PORTABLE CHEST 1 VIEW COMPARISON:  Radiographs 04/30/2017 and 11/04/2016. FINDINGS: 0958 hour. Stable mild cardiomegaly and aortic atherosclerosis. There is new focal airspace disease at the right lung base without clear obscuration of the right hemidiaphragm or right heart border. This may be in the middle or lower lobe. The left lung is clear. There is no pneumothorax or significant pleural effusion. The bones appear unchanged. IMPRESSION: New right basilar infiltrate suspicious for pneumonia, possibly on the basis of aspiration. Electronically Signed   By: Richardean Sale M.D.   On: 05/01/2017 10:12    ASSESSMENT AND PLAN:   Active Problems:   Syncope   Sepsis ([place])  * Ac hypoxic respi failure   Have crackling and he has received IV fluids, so may have some pulm edema   Cont NRBM , get Xray chest stat and will give Inj lasix.    Transfer to stepdown unit.   He was also not on anticoagulant due to GI bleed, may have PE as a cause for sudden respi  distress. Ordered CT angio for PE.    Discussed with ICU physician.  * Sepsis  Have fever, respi distress, some confusion.    Get stat CBC and lactic acids.   Vanc+ zosyn for now.   UA is negative, Check for Influenza.    Send Cx.  *  Syncope: Multifactorial; etiologies include symptomatic anemia, Parkinson's disease and/or arrhythmia.  The patient has history of falls, orthostatic hypotension as well as atrial fibrillation.  He has been on anticoagulation and is Hemoccult positive.  He underwent colonoscopy 2 weeks ago at [facility] which showed some bleeding polyps.  He denies palpitations or chest pain.    Cardiologist suggest no further work ups.   No anticoagulants for now, echo, iV fluids.  *  Anemia: hold the patient's Eliquis and aspirin.  Consult gastroenterology   *  Coronary artery disease: Stable; troponin negative and no EKG evidence of ischemia.  * Parkinson's disease: Makes patient more prone to falls.  Continue Sinemet  *  Atrial fibrillation: Rate controlled; continue diltiazem.  *  Diabetes mellitus type 2: Hold metformin.  Sliding scale insulin while hospitalized  *  Hyperlipidemia: Continue statin therapy *  Seizure disorder: Continue Keppra *  COPD: Continue inhaled corticosteroid as well as Spiriva.  Albuterol as needed. *  DVT prophylaxis: SCDs *  GI prophylaxis: Continue PPI per home regimen   All the records are reviewed and case discussed with Care Management/Social Workerr. Management plans discussed with the patient, family and they are in agreement.  CODE STATUS: full.  TOTAL TIME TAKING CARE OF THIS PATIENT: 45 critical care minutes.     POSSIBLE D/C IN 2-3 DAYS, DEPENDING ON CLINICAL CONDITION.   Vaughan Basta M.D on 05/02/2017   Between 7am to 6pm - Pager - [phone]  After 6pm go to [email or web address] - password [email or web address] [facility]  Office  [phone]  CC: Primary care physician; [employer] [place]  Note: This dictation was prepared with Sales executive along with smaller Company secretary. Any transcriptional errors that result from this process are unintentional.

## 2017-05-02 NOTE — Progress Notes (Signed)
Critical lactic acid of 4.5 called from lab during shift report. Stat lactic acid reordered by Dr. Chancy Milroy. Awaiting results.

## 2017-05-02 NOTE — Progress Notes (Signed)
Repeat hemoglobin resulted at 7.5, repeat lactic resulted at 4.1. Dr. Humphrey Rolls notified. Verbal orders to hold blood transfusion until repeat hemoglobin check at 1400 and abdominal CT. Orders read back and acknowledged. Will continue to assess.

## 2017-05-02 NOTE — Progress Notes (Addendum)
[facility] Consultation     Date: 05/02/2017,   MRN# [id number] Jimmy Hill 1941/02/24 Code Status:     Code Status Orders  (From admission, onward)        Start     Ordered   04/30/17 0835  Full code  Continuous     04/30/17 0834      AdmissionWeight: 190 lb (86.2 kg)                 CurrentWeight: 200 lb 2.8 oz (90.8 kg) Jimmy Hill is a 76 y.o. old male    SUBJECTIVE:   No acute overnight events. Patient looks better today compared to yesterday. FiO2 requirements remain at 6 L. Patient's blood pressure responded to fluid boluses.  Did not require vasopressors. Hemoglobin drop noted.  Almost 1.5 g of hemoglobin drop on a.m. labs.  There is no overt bleed. Lactic acid elevation noted as well.  Patient has remained hemodynamically stable.  He denies abdominal pain, nausea, vomiting, difficulty breathing.  Patient states he is hungry and wants to eat stating, "how am I going to get strong if I do not eat?"   MEDICATIONS   Current Medication:   Current Facility-Administered Medications:  .  0.9 %  sodium chloride infusion, , Intravenous, Continuous, Nettie Elm, MD, Last Rate: 125 mL/hr at 05/01/17 2338 .  0.9 %  sodium chloride infusion, , Intravenous, Once, Tukov, Magadalene S, NP .  acetaminophen (TYLENOL) tablet 650 mg, 650 mg, Oral, Q6H PRN, Harrie Foreman, MD .  albuterol (PROVENTIL) (2.5 MG/3ML) 0.083% nebulizer solution 2.5 mg, 2.5 mg, Nebulization, Q4H PRN, Harrie Foreman, MD, 2.5 mg at 05/01/17 0841 .  atorvastatin (LIPITOR) tablet 80 mg, 80 mg, Oral, QHS, Harrie Foreman, MD, 80 mg at 05/01/17 2232 .  brimonidine (ALPHAGAN) 0.2 % ophthalmic solution 1 drop, 1 drop, Both Eyes, BID, 1 drop at 05/01/17 2233 **AND** timolol (TIMOPTIC) 0.5 % ophthalmic solution 1 drop, 1 drop, Both Eyes, BID, Vaughan Basta, MD, 1 drop at 05/01/17 2233 .  carbidopa-levodopa (SINEMET IR) 25-250 MG per tablet immediate release 1 tablet, 1  tablet, Oral, TID, Vaughan Basta, MD, 1 tablet at 05/01/17 2232 .  carbidopa-levodopa (SINEMET IR) 25-250 MG per tablet immediate release 2 tablet, 2 tablet, Oral, Q breakfast, Harrie Foreman, MD, 2 tablet at 05/02/17 [phone] .  docusate sodium (COLACE) capsule 100 mg, 100 mg, Oral, BID, Harrie Foreman, MD, 100 mg at 05/01/17 2232 .  fludrocortisone (FLORINEF) tablet 0.2 mg, 0.2 mg, Oral, Daily, Harrie Foreman, MD, 0.2 mg at 05/01/17 1191 .  fluticasone (FLONASE) 50 MCG/ACT nasal spray 2 spray, 2 spray, Each Nare, Daily PRN, Harrie Foreman, MD .  gabapentin (NEURONTIN) tablet 600 mg, 600 mg, Oral, Daily, Harrie Foreman, MD, 600 mg at 05/01/17 4782 .  guaiFENesin-dextromethorphan (ROBITUSSIN DM) 100-10 MG/5ML syrup 10 mL, 10 mL, Oral, Q6H PRN, Harrie Foreman, MD, 10 mL at 05/02/17 0515 .  insulin aspart (novoLOG) injection 0-5 Units, 0-5 Units, Subcutaneous, QHS, Harrie Foreman, MD .  insulin aspart (novoLOG) injection 0-9 Units, 0-9 Units, Subcutaneous, TID WC, Harrie Foreman, MD, 2 Units at 05/02/17 [phone] .  ipratropium-albuterol (DUONEB) 0.5-2.5 (3) MG/3ML nebulizer solution 3 mL, 3 mL, Nebulization, Q6H, Nettie Elm, MD, 3 mL at 05/02/17 0829 .  levETIRAcetam (KEPPRA) tablet 500 mg, 500 mg, Oral, Q12H, Harrie Foreman, MD, 500 mg at 05/01/17 2233 .  loperamide (IMODIUM) capsule 2 mg, 2 mg,  Oral, PRN, Harrie Foreman, MD .  loratadine (CLARITIN) tablet 10 mg, 10 mg, Oral, Daily PRN, Harrie Foreman, MD .  magnesium hydroxide (MILK OF MAGNESIA) suspension 15 mL, 15 mL, Oral, Daily PRN, Harrie Foreman, MD .  magnesium sulfate IVPB 4 g 100 mL, 4 g, Intravenous, Once, Tukov, Magadalene S, NP, Last Rate: 50 mL/hr at 05/02/17 0824, 4 g at 05/02/17 0824 .  methylPREDNISolone sodium succinate (SOLU-MEDROL) 40 mg/mL injection 40 mg, 40 mg, Intravenous, Q6H, Nettie Elm, MD, 40 mg at 05/02/17 0601 .  nitroGLYCERIN (NITROSTAT) SL tablet 0.4 mg, 0.4 mg,  Sublingual, Q5 min PRN, Harrie Foreman, MD .  ondansetron [facility]) tablet 4 mg, 4 mg, Oral, Q6H PRN **OR** ondansetron (ZOFRAN) injection 4 mg, 4 mg, Intravenous, Q6H PRN, Harrie Foreman, MD .  oxyCODONE (Oxy IR/ROXICODONE) immediate release tablet 10 mg, 10 mg, Oral, Q12H PRN, Harrie Foreman, MD, 10 mg at 05/01/17 0346 .  pantoprazole (PROTONIX) injection 40 mg, 40 mg, Intravenous, Q12H, Harrie Foreman, MD, 40 mg at 05/01/17 2233 .  piperacillin-tazobactam (ZOSYN) IVPB 3.375 g, 3.375 g, Intravenous, Q8H, Vaughan Basta, MD, Last Rate: 12.5 mL/hr at 05/02/17 0601, 3.375 g at 05/02/17 0601 .  polyethylene glycol-electrolytes (NuLYTELY/GoLYTELY) solution 4,000 mL, 4,000 mL, Oral, Once, Jonathon Bellows, MD .  potassium chloride 10 mEq in 100 mL IVPB, 10 mEq, Intravenous, Q1 Hr x 4, Nettie Elm, MD .  senna-docusate (Senokot-S) tablet 1 tablet, 1 tablet, Oral, Daily, Harrie Foreman, MD, 1 tablet at 05/01/17 [phone] .  sodium chloride flush (NS) 0.9 % injection 10-40 mL, 10-40 mL, Intracatheter, Q12H, Vaughan Basta, MD, 10 mL at 05/01/17 2249 .  sodium chloride flush (NS) 0.9 % injection 10-40 mL, 10-40 mL, Intracatheter, PRN, Vaughan Basta, MD .  sodium chloride flush (NS) 0.9 % injection 3 mL, 3 mL, Intravenous, Q12H, Vaughan Basta, MD, 3 mL at 05/01/17 2233 .  Tdap (BOOSTRIX) injection 0.5 mL, 0.5 mL, Intramuscular, Once, Rifenbark, Milta Deiters, MD .  vancomycin (VANCOCIN) 1,250 mg in sodium chloride 0.9 % 250 mL IVPB, 1,250 mg, Intravenous, Q12H, Vaughan Basta, MD, Stopped at 05/02/17 0721   VS: BP (!) 102/56   Pulse 94   Temp 97.6 F (36.4 C) (Oral)   Resp 16   Ht 5\' 8"  (1.727 m)   Wt 200 lb 2.8 oz (90.8 kg)   SpO2 96%   BMI 30.44 kg/m      PHYSICAL EXAM   Physical Exam Awake, alert.  No distress. Sclera anicteric. Sitting comfortably in bed. CVS S1, S2, 0. Chest: Equal breath sounds bilaterally.  No rales or rhonchi.  Has  occasional bilateral wheezes.  Abdomen is soft, nontender, nondistended.  Bowel sounds positive. No lower extremity edema bilaterally.     LABS    Recent Labs    04/30/17 0417 04/30/17 0422  05/01/17 0100 05/01/17 0904 05/01/17 1031 05/01/17 1644 05/02/17 0550  HGB 8.8*  --    < > 8.1* 9.0* 9.2* 8.3* 6.7*  HCT 29.1*  --   --   --   --  30.9*  --  23.6*  MCV 69.6*  --   --   --   --  68.9*  --  70.3*  WBC 8.2  --   --   --   --  17.4*  --  18.2*  BUN 11  --   --  12  --  11  --  11  CREATININE 0.81  --   --  0.70  --  0.76  --  0.79  GLUCOSE 196*  --   --  143*  --  121*  --  181*  CALCIUM 8.2*  --   --  7.7*  --  8.0*  --  6.6*  INR  --  1.65  --   --   --  1.24  --   --    < > = values in this interval not displayed.  ,    No results for input(s): PH in the last 72 hours.  Invalid input(s): PCO2, PO2, BASEEXCESS, BASEDEFICITE, TFT    CULTURE RESULTS   Recent Results (from the past 240 hour(s))  MRSA PCR Screening     Status: None   Collection Time: 05/01/17 10:28 AM  Result Value Ref Range Status   MRSA by PCR NEGATIVE NEGATIVE Final    Comment:        The GeneXpert MRSA Assay (FDA approved for NASAL specimens only), is one component of a comprehensive MRSA colonization surveillance program. It is not intended to diagnose MRSA infection nor to guide or monitor treatment for MRSA infections.   Culture, blood (x 2)     Status: None (Preliminary result)   Collection Time: 05/01/17 10:31 AM  Result Value Ref Range Status   Specimen Description BLOOD RIGHT HAND  Final   Special Requests   Final    BOTTLES DRAWN AEROBIC AND ANAEROBIC Blood Culture adequate volume   Culture NO GROWTH < 24 HOURS  Final   Report Status PENDING  Incomplete  Culture, blood (x 2)     Status: None (Preliminary result)   Collection Time: 05/01/17 10:33 AM  Result Value Ref Range Status   Specimen Description BLOOD LEFT HAND  Final   Special Requests   Final    BOTTLES DRAWN  AEROBIC AND ANAEROBIC Blood Culture adequate volume   Culture NO GROWTH < 24 HOURS  Final   Report Status PENDING  Incomplete          IMAGING    Dg Chest 1 View  Result Date: 05/02/2017 CLINICAL DATA:  76 year old male with sepsis.  Hypoxia. EXAM: CHEST 1 VIEW COMPARISON:  05/01/2017 and earlier. FINDINGS: Portable AP upright view at 0354 hours. Right PICC line remains in place. Tip at the lower SVC level. Continued confluent opacity at the right lung base which first appeared yesterday. Stable lung volumes. No other confluent pulmonary opacity. No pneumothorax, pulmonary edema or pleural effusion. Stable cardiac size and mediastinal contours. Visualized tracheal air column is within normal limits. IMPRESSION: 1. Persistent right lower lobe confluent opacity. Consider aspiration and/or pneumonia. 2. No new cardiopulmonary abnormality. Electronically Signed   By: Genevie Ann M.D.   On: 05/02/2017 06:46   Dg Chest Port 1 View  Result Date: 05/01/2017 CLINICAL DATA:  PICC line placement. EXAM: PORTABLE CHEST 1 VIEW COMPARISON:  05/01/2017 FINDINGS: Right-sided PICC line terminates at the expected location of cavoatrial junction. No evidence of pneumothorax. Cardiomediastinal silhouette is enlarged. Calcific atherosclerotic disease of the aorta. Mediastinal contours appear intact. Persistent airspace opacity in the right lower lobe. Osseous structures are without acute abnormality. Soft tissues are grossly normal. IMPRESSION: Status post PICC line placement.  No evidence of pneumothorax. Persistent right lower lobe airspace disease. Electronically Signed   By: Fidela Salisbury M.D.   On: 05/01/2017 14:33   Dg Chest Port 1 View  Result Date: 05/01/2017 CLINICAL DATA:  Sepsis. History of congestive heart failure, COPD and coronary artery disease. EXAM:  PORTABLE CHEST 1 VIEW COMPARISON:  Radiographs 04/30/2017 and 11/04/2016. FINDINGS: 0958 hour. Stable mild cardiomegaly and aortic atherosclerosis.  There is new focal airspace disease at the right lung base without clear obscuration of the right hemidiaphragm or right heart border. This may be in the middle or lower lobe. The left lung is clear. There is no pneumothorax or significant pleural effusion. The bones appear unchanged. IMPRESSION: New right basilar infiltrate suspicious for pneumonia, possibly on the basis of aspiration. Electronically Signed   By: Richardean Sale M.D.   On: 05/01/2017 10:12     ASSESSMENT/PLAN    76 years old gentleman with a past medical history significant for Parkinson's, coronary artery disease status post stenting, COPD, diabetes, CHF, CKD stage III, glaucoma, dyslipidemia, restless leg syndrome, polyneuropathy, obstructive sleep apnea, ongoing tobacco abuse-smokes half a pack of cigarettes a day who was admitted to the hospital yesterday with syncope and was found to have anemia.  Transferred to the SDU / ICU 05/01/17 for acute hypoxemic respiratory failure  Problem list Acute hypoxemic respiratory failure secondary to pneumonia Right lower lobe pneumonia Sepsis Lactic acidosis Recent syncope Anemia Guaiac positive stools Liver mass History of coronary artery disease status post stent placement Parkinson's COPD Diabetes CKD stage III Glaucoma Dyslipidemia Restless leg syndrome Polyneuropathy Obstructive sleep apnea Ongoing tobacco abuse-half a pack of cigarettes a day.  Acute hypoxemic respiratory failure is significantly better.  Patient is requiring 6 L of oxygen.  States he feels better.  Denies any respiratory complaints. Continue methylprednisone 40 mg every 6 hours Continue duo nebs every 6 hours. Continue vancomycin and Zosyn for now.  Has remained hemodynamically stable after fluid resuscitation.   Lactic acid elevation this morning noted. ?  Cause.  Clinically the patient looks much better, is asymptomatic and is doing fine.   His blood pressure has remained stable.  His urine  output is adequate. To my review he is not on any medications that could result in increase in lactic acid levels. We will repeat a stat lactic acid level to make sure the lactate elevation of 4.5 this morning was not lab error. If lactic acid levels remain elevated, we will get a CT abdomen pelvis with contrast to assess for bowel ischemia though clinically this does not seem to be the case.  Hemoglobin drop noted.  Patient was given IV fluids yesterday but looking at the I's and O's, he is not in a positive fluid balance.  The patient is not having any overt bleeding. No acute invasive procedures except for PICC line placement yesterday. ?  Lab error. Repeat stat hemoglobin and hematocrit.  If remains low, will transfuse the patient PRBCs and evaluate the patient for retroperitoneal hematoma on the CT abdomen. Left the hemoglobin dropped this morning was not a lot better, the patient will need serial hemoglobin checks posttransfusion  ?  Cause for syncope.?  Carotid disease as seen on the CT of the C-spine. Head CT scan was negative but showed scattered calcification at the carotid bifurcations bilaterally.  The patient should get a carotid ultrasound but this should be done when his acute issues mentioned above have been addressed and are stable.  Patient has guaiac positive stools with recent history of upper and lower endoscopy at [facility] that did not show any active bleeding. GI is on board and is planning capsule endoscopy. Per GIs note, the patient was found to have a liver mass.  GI is planning repeat imaging of liver lesion for further workup  and diagnosis.  Will defer to GI. Continue PPI twice daily  Cardiology is on board.  Follow recommendations. Eliquis has been held given his anemia.  Cardiology is okay with it  Continue Lipitor Continue Sinemet Continue gabapentin Insulin sliding scale Continue Keppra Smoking cessation counseling.  PPI for stress ulcer prophylaxis as well  as guaiac positive stool SCD for DVT prophylaxis Insulin sliding scale for glycemic control  Keep NPO till lactic acid elevation is sorted out.  CC time 45 minutes  Nettie Elm, M.D.  Pulmonary & Critical Care Medicine   Addendum:  Repeat hgb 7.4. I think the hgb 6.7 from this AM was a lab error. Will hold off on PRBC transfusion and repeat hgb at 2 pm. Repeat lactic acid remain elevated at 4.1.  Check CT abdomen / pelvis with contrast for bowel ischemia and retroperitoneal hematoma. Repeat LA at 2 PM.  Nettie Elm, M.D.  Pulmonary & Critical Care Medicine

## 2017-05-02 NOTE — Progress Notes (Signed)
[place] for electrolyte management   Pharmacy consulted for electrolyte management for 76 yo male ICU patient presenting with hypokalemia.   Plan:  Potassium (mmol/L)  Date Value  05/02/2017 3.7  12/08/2013 3.9   Magnesium (mg/dL)  Date Value  05/02/2017 1.8   Will replace with magnesium sulfate 2 g iv once and KCl 40 meq po once in this 76 y/o M with atrial fibrillation. Will f/u AM labs.    Allergies  Allergen Reactions  . Cheese     Patient doesn't want any cheese    Patient Measurements: Height: 5\' 8"  (172.7 cm) Weight: 200 lb 2.8 oz (90.8 kg) IBW/kg (Calculated) : 68.4  Vital Signs: Temp: 97.7 F (36.5 C) (12/15 1815) Temp Source: Oral (12/15 1815) BP: 95/49 (12/15 1815) Pulse Rate: 99 (12/15 1815) Intake/Output from previous day: 12/14 0701 - 12/15 0700 In: 2225.4 [P.O.:2; I.V.:1923.4; IV Piggyback:300] Out: 2050 [Urine:2050] Intake/Output from this shift: No intake/output data recorded.  Labs: Recent Labs    04/30/17 0417  05/01/17 0100  05/01/17 1031  05/02/17 0550 05/02/17 0816 05/02/17 1424 05/02/17 1836  WBC 8.2  --   --   --  17.4*  --  18.2*  --   --   --   HGB 8.8*   < > 8.1*   < > 9.2*   < > 6.7* 7.4* 7.1*  --   HCT 29.1*  --   --   --  30.9*  --  23.6* 25.5* 24.3*  --   PLT 257  --   --   --  271  --  213  --   --   --   APTT  --   --   --   --  35  --   --   --   --   --   CREATININE 0.81  --  0.70  --  0.76  --  0.79  --   --   --   MG 1.1*  --  1.7  --   --   --  1.2*  --   --  1.8  PHOS  --   --   --   --  2.5  --  2.8  --   --   --   ALBUMIN 3.0*  --   --   --  3.2*  --  2.2*  --   --   --   PROT 6.2*  --   --   --  6.5  --  4.6*  --   --   --   AST 24  --   --   --  24  --  34  --   --   --   ALT 6*  --   --   --  <5*  --  6*  --   --   --   ALKPHOS 66  --   --   --  70  --  45  --   --   --   BILITOT 0.6  --   --   --  1.0  --  0.7  --   --   --    < > = values in this interval not  displayed.   Estimated Creatinine Clearance: 86 mL/min (by C-G formula based on SCr of 0.79 mg/dL).   Pharmacy will continue to monitor and adjust per consult.   Ulice Dash D 05/02/2017,7:03  PM   

## 2017-05-02 NOTE — Progress Notes (Signed)
Notified Maggie, NP of pt's morning lab values: Hgb of 6.7, potassium of 3.0, and magnesium 1.2; acknowledged, see new orders.

## 2017-05-03 ENCOUNTER — Encounter: Payer: Self-pay | Admitting: Pulmonary Disease

## 2017-05-03 DIAGNOSIS — D500 Iron deficiency anemia secondary to blood loss (chronic): Secondary | ICD-10-CM

## 2017-05-03 DIAGNOSIS — J181 Lobar pneumonia, unspecified organism: Secondary | ICD-10-CM

## 2017-05-03 LAB — BPAM RBC
Blood Product Expiration Date: 201812232359
ISSUE DATE / TIME: 201812151536
UNIT TYPE AND RH: 600

## 2017-05-03 LAB — TYPE AND SCREEN
ABO/RH(D): A POS
ANTIBODY SCREEN: NEGATIVE
Unit division: 0

## 2017-05-03 LAB — COMPREHENSIVE METABOLIC PANEL
ALK PHOS: 50 U/L (ref 38–126)
ANION GAP: 7 (ref 5–15)
AST: 25 U/L (ref 15–41)
Albumin: 2.6 g/dL — ABNORMAL LOW (ref 3.5–5.0)
BILIRUBIN TOTAL: 1.1 mg/dL (ref 0.3–1.2)
BUN: 13 mg/dL (ref 6–20)
CALCIUM: 7.5 mg/dL — AB (ref 8.9–10.3)
CO2: 24 mmol/L (ref 22–32)
CREATININE: 0.81 mg/dL (ref 0.61–1.24)
Chloride: 109 mmol/L (ref 101–111)
GFR calc non Af Amer: >60 mL/min (ref >60)
GLUCOSE: 185 mg/dL — AB (ref 65–99)
Potassium: 3.5 mmol/L (ref 3.5–5.1)
SODIUM: 140 mmol/L (ref 135–145)
TOTAL PROTEIN: 5.4 g/dL — AB (ref 6.5–8.1)

## 2017-05-03 LAB — CBC
HCT: 26.9 % — ABNORMAL LOW (ref 40.0–52.0)
Hemoglobin: 8.1 g/dL — ABNORMAL LOW (ref 13.0–18.0)
MCH: 21.4 pg — AB (ref 26.0–34.0)
MCHC: 30.3 g/dL — AB (ref 32.0–36.0)
MCV: 70.7 fL — ABNORMAL LOW (ref 80.0–100.0)
PLATELETS: 210 10*3/uL (ref 150–440)
RBC: 3.8 MIL/uL — AB (ref 4.40–5.90)
RDW: 18.7 % — ABNORMAL HIGH (ref 11.5–14.5)
WBC: 13.7 10*3/uL — ABNORMAL HIGH (ref 3.8–10.6)

## 2017-05-03 LAB — GLUCOSE, CAPILLARY
GLUCOSE-CAPILLARY: 212 mg/dL — AB (ref 65–99)
Glucose-Capillary: 186 mg/dL — ABNORMAL HIGH (ref 65–99)
Glucose-Capillary: 268 mg/dL — ABNORMAL HIGH (ref 65–99)
Glucose-Capillary: 203 mg/dL — ABNORMAL HIGH (ref 65–99)

## 2017-05-03 LAB — MAGNESIUM: MAGNESIUM: 1.8 mg/dL (ref 1.7–2.4)

## 2017-05-03 LAB — VANCOMYCIN, TROUGH: Vancomycin Tr: 19 ug/mL (ref 15–20)

## 2017-05-03 LAB — PHOSPHORUS: PHOSPHORUS: 2.6 mg/dL (ref 2.5–4.6)

## 2017-05-03 LAB — PREPARE RBC (CROSSMATCH)

## 2017-05-03 LAB — LACTIC ACID, PLASMA: Lactic Acid, Venous: 2.9 mmol/L (ref 0.5–1.9)

## 2017-05-03 MED ORDER — MAGNESIUM SULFATE 2 GM/50ML IV SOLN
2.0000 g | Freq: Once | INTRAVENOUS | Status: AC
  Administered 2017-05-03: 2 g via INTRAVENOUS
  Filled 2017-05-03: qty 50

## 2017-05-03 MED ORDER — POTASSIUM CHLORIDE 20 MEQ PO PACK
40.0000 meq | PACK | Freq: Once | ORAL | Status: AC
  Administered 2017-05-03: 40 meq via ORAL
  Filled 2017-05-03: qty 2

## 2017-05-03 MED ORDER — K PHOS MONO-SOD PHOS DI & MONO 155-852-130 MG PO TABS
1000.0000 mg | ORAL_TABLET | Freq: Once | ORAL | Status: AC
  Administered 2017-05-03: 1000 mg via ORAL
  Filled 2017-05-03: qty 4

## 2017-05-03 NOTE — Progress Notes (Signed)
[place] for electrolyte management   Pharmacy consulted for electrolyte management for 76 yo male ICU patient presenting with hypokalemia.   Plan:  Potassium (mmol/L)  Date Value  05/03/2017 3.5  12/08/2013 3.9   Magnesium (mg/dL)  Date Value  05/03/2017 1.8   Lab Results  Component Value Date   CALCIUM 7.5 (L) 05/03/2017   PHOS 2.6 05/03/2017   Will replace with magnesium sulfate 2 g iv once in this 76 y/o M with atrial fibrillation.  MD has ordered KPhos neutral 1000mg  tabs PO x 1 and KCL 40 meq packet PO x1.  Will f/u AM labs.    Allergies  Allergen Reactions  . Cheese     Patient doesn't want any cheese    Patient Measurements: Height: 5\' 8"  (172.7 cm) Weight: 199 lb 15.3 oz (90.7 kg) IBW/kg (Calculated) : 68.4  Vital Signs: Temp: 98.3 F (36.8 C) (12/16 0800) Temp Source: Oral (12/16 0800) BP: 138/83 (12/16 0800) Pulse Rate: 106 (12/16 0600) Intake/Output from previous day: 12/15 0701 - 12/16 0700 In: 5124.2 [I.V.:3224.2; Blood:600; IV Piggyback:1300] Out: 1300 [Urine:1300] Intake/Output from this shift: Total I/O In: -  Out: 300 [Urine:300]   Estimated Creatinine Clearance: 84.8 mL/min (by C-G formula based on SCr of 0.81 mg/dL).   Pharmacy will continue to monitor and adjust per consult.   Kerline Trahan A 05/03/2017,10:41 AM

## 2017-05-03 NOTE — Progress Notes (Signed)
Pharmacy Antibiotic Note  Jimmy Hill is a 76 y.o. male admitted on 04/30/2017 with sepsis.  Patient admitted to ICU from 2A due to rapid response. Pharmacy has been consulted for vancomycin and Zosyn dosing.   Plan: Will start Zosyn EI 3.375g IV Q8hr.   Patient ordered vancomycin 1500mg  IV x 1, will continue vancomycin 1250mg  IV Q12hr for goal trough of 15-20. Will obtain trough prior to am dose on 12/16.   Height: 5\' 8"  (172.7 cm) Weight: 199 lb 15.3 oz (90.7 kg) IBW/kg (Calculated) : 68.4  Temp (24hrs), Avg:97.9 F (36.6 C), Min:97.7 F (36.5 C), Max:98.6 F (37 C)  Recent Labs  Lab 04/30/17 0417 05/01/17 0100 05/01/17 1031  05/01/17 1644 05/02/17 0550 05/02/17 0627 05/02/17 0816 05/02/17 1424 05/02/17 1836 05/02/17 2052 05/03/17 0420  WBC 8.2  --  17.4*  --   --  18.2*  --   --   --   --   --  13.7*  CREATININE 0.81 0.70 0.76  --   --  0.79  --   --   --  0.86  --  0.81  LATICACIDVEN  --   --  2.6*   < > 2.2*  --  4.5* 4.1* 3.8*  --  4.4*  --   VANCOTROUGH  --   --   --   --   --   --   --   --   --   --   --  19   < > = values in this interval not displayed.    Estimated Creatinine Clearance: 84.8 mL/min (by C-G formula based on SCr of 0.81 mg/dL).    Allergies  Allergen Reactions  . Cheese     Patient doesn't want any cheese    Antimicrobials this admission: Vancomycin 12/14 >>  Zosyn 12/14 >>   Dose adjustments this admission: 12/16 0430 vanc 19. Renal function stable. Continue current regimen.  Microbiology results: 12/14 BCx: sent  12/14 MRSA PCR: negative   Thank you for allowing pharmacy to be a part of this patient's care.  Cordarrel Stiefel S 05/03/2017 5:02 AM

## 2017-05-03 NOTE — Progress Notes (Signed)
Jimmy Antigua, MD [address] [place], [place], [place], [id number], [place], [place], [place], [address] Phone: [phone]  Fax: [phone]   Subjective: Patient's main complaint today is that he would like to eat.  He is on clear liquids but would like solid food.   Objective: Vital signs in last 24 hours: Vitals:   05/03/17 0500 05/03/17 0600 05/03/17 0800 05/03/17 0845  BP: (!) 131/91 (!) 143/86 138/83   Pulse: (!) 106 (!) 106    Resp:      Temp:   98.3 F (36.8 C)   TempSrc:   Oral   SpO2: 98% 96%  95%  Weight:      Height:       Weight change: -3.5 oz (-0.1 kg)  Intake/Output Summary (Last 24 hours) at 05/03/2017 1135 Last data filed at 05/03/2017 1039 Gross per 24 hour  Intake 4114.58 ml  Output 1350 ml  Net 2764.58 ml     Exam: Abd: Soft, NT/ND, No HSM Skin: Warm, no rashes Neck: Supple, Trachea midline   Lab Results: Labs reviewed Micro Results: Recent Results (from the past 240 hour(s))  MRSA PCR Screening     Status: None   Collection Time: 05/01/17 10:28 AM  Result Value Ref Range Status   MRSA by PCR NEGATIVE NEGATIVE Final    Comment:        The GeneXpert MRSA Assay (FDA approved for NASAL specimens only), is one component of a comprehensive MRSA colonization surveillance program. It is not intended to diagnose MRSA infection nor to guide or monitor treatment for MRSA infections.   Culture, blood (x 2)     Status: None (Preliminary result)   Collection Time: 05/01/17 10:31 AM  Result Value Ref Range Status   Specimen Description BLOOD RIGHT HAND  Final   Special Requests   Final    BOTTLES DRAWN AEROBIC AND ANAEROBIC Blood Culture adequate volume   Culture NO GROWTH 2 DAYS  Final   Report Status PENDING  Incomplete  Culture, blood (x 2)     Status: None (Preliminary result)   Collection Time: 05/01/17 10:33 AM  Result Value Ref Range Status   Specimen Description BLOOD LEFT HAND  Final   Special  Requests   Final    BOTTLES DRAWN AEROBIC AND ANAEROBIC Blood Culture adequate volume   Culture NO GROWTH 2 DAYS  Final   Report Status PENDING  Incomplete   Studies/Results: Dg Chest 1 View  Result Date: 05/02/2017 CLINICAL DATA:  76 year old male with sepsis.  Hypoxia. EXAM: CHEST 1 VIEW COMPARISON:  05/01/2017 and earlier. FINDINGS: Portable AP upright view at 0354 hours. Right PICC line remains in place. Tip at the lower SVC level. Continued confluent opacity at the right lung base which first appeared yesterday. Stable lung volumes. No other confluent pulmonary opacity. No pneumothorax, pulmonary edema or pleural effusion. Stable cardiac size and mediastinal contours. Visualized tracheal air column is within normal limits. IMPRESSION: 1. Persistent right lower lobe confluent opacity. Consider aspiration and/or pneumonia. 2. No new cardiopulmonary abnormality. Electronically Signed   By: Genevie Ann M.D.   On: 05/02/2017 06:46   Dg Chest Port 1 View  Result Date: 05/01/2017 CLINICAL DATA:  PICC line placement. EXAM: PORTABLE CHEST 1 VIEW COMPARISON:  05/01/2017 FINDINGS: Right-sided PICC line terminates at the expected location of cavoatrial junction. No evidence of pneumothorax. Cardiomediastinal silhouette is enlarged. Calcific atherosclerotic disease of the aorta. Mediastinal contours appear intact. Persistent airspace opacity in the  right lower lobe. Osseous structures are without acute abnormality. Soft tissues are grossly normal. IMPRESSION: Status post PICC line placement.  No evidence of pneumothorax. Persistent right lower lobe airspace disease. Electronically Signed   By: Fidela Salisbury M.D.   On: 05/01/2017 14:33   Ct Angio Abd/pel W/ And/or W/o  Addendum Date: 05/02/2017   ADDENDUM REPORT: 05/02/2017 13:26 ADDENDUM: In the report, a small amount of free fluid about the spleen was mentioned. There is also small amount of free fluid in the left pericolic gutter extending towards the  left side of the pelvis. There is also a small amount of free fluid adjacent to the left side of the dome of the bladder. Again, these findings are nonspecific. In addition, there is no focal wall thickening of the colon or pneumatosis to suggest bowel ischemia. Electronically Signed   By: Marybelle Killings M.D.   On: 05/02/2017 13:26   Result Date: 05/02/2017 CLINICAL DATA:  Recent back surgery.  Drop in hemoglobin. EXAM: CTA ABDOMEN AND PELVIS wITHOUT AND WITH CONTRAST TECHNIQUE: Multidetector CT imaging of the abdomen and pelvis was performed using the standard protocol during bolus administration of intravenous contrast. Multiplanar reconstructed images and MIPs were obtained and reviewed to evaluate the vascular anatomy. CONTRAST:  137mL ISOVUE-370 IOPAMIDOL (ISOVUE-370) INJECTION 76% COMPARISON:  None. FINDINGS: VASCULAR Aorta: Nonaneurysmal and patent. Scattered atherosclerotic calcifications throughout the aorta. Celiac: Patent.  Branch vessels patent. SMA: Atherosclerotic calcifications at the origin. No significant narrowing. Renals: There are atherosclerotic calcifications at the origins of both renal arteries. Significant narrowing at the origin of the right renal artery cannot be excluded. Left renal artery is patent. IMA: Moderate disease at the origin. Inflow: There are atherosclerotic calcifications throughout the iliac arterial distribution. No significant narrowing. Proximal Outflow: Right common femoral artery is patent with some plaque. There is some disease at the origin of the right superficial femoral artery. There is calcified and smooth plaque in the left common femoral artery without significant narrowing. Visualize left superficial femoral artery is patent. Veins: No obvious DVT. Review of the MIP images confirms the above findings. NON-VASCULAR Lower chest: Bibasilar patchy airspace opacities. Small bilateral pleural effusions. Hepatobiliary: The contour of the liver somewhat nodular. There  is an ill-defined low-density lesion in the lateral segment of the left lobe of liver on image 15 of series 6. Nodular tiny lesion in the medial segment of the left lobe on image 13. Tiny right lobe lesion is present on image 34 of series 6. Gallbladder is somewhat distended but otherwise unremarkable. Pancreas: Unremarkable Spleen: Spleen is unremarkable. There is a small amount of fluid about the spleen. Adrenals/Urinary Tract: Multiple calculi present within the collecting system of the left kidney. Small hypodensities in both kidneys are nonspecific. There is a 4.3 cm simple cyst emanating from the lower pole of the right kidney anteriorly. Adrenal glands are within normal limits. Bladder is unremarkable. Stomach/Bowel: Prominent stool burden throughout the colon. There is no obvious mass throughout the length of the colon. Appendix is nonvisualized. No evidence of small-bowel obstruction. Stomach and duodenum are within normal limits. Lymphatic: No abnormal retroperitoneal adenopathy. Reproductive: Within normal limits. Other: Small amount of free fluid is present about the spleen. There is no evidence of retroperitoneal hemorrhage. Musculoskeletal: Postoperative changes from L2 through L5 fusion are noted. Mild T10 compression deformity has a chronic appearance. IMPRESSION: VASCULAR There is no evidence of retroperitoneal hemorrhage. Atherosclerotic changes throughout the abdomen and pelvis are noted. Significant narrowing at the origin of  the right renal artery is suspected. Correlate with renal function and the presence of uncontrolled hypertension. NON-VASCULAR The contour of the liver is somewhat nodular suggesting early cirrhosis. There are multiple lesions within the liver. Given the above finding, malignancy is not excluded. MRI of the liver is recommended Small amount of free fluid in the abdomen. Bibasilar patchy airspace disease and small pleural effusions. Bilateral nephrolithiasis. Electronically  Signed: By: Marybelle Killings M.D. On: 05/02/2017 11:18   Medications:  Scheduled Meds: . atorvastatin  80 mg Oral QHS  . brimonidine  1 drop Both Eyes BID   And  . timolol  1 drop Both Eyes BID  . carbidopa-levodopa  1 tablet Oral TID  . carbidopa-levodopa  2 tablet Oral Q breakfast  . docusate sodium  100 mg Oral BID  . fludrocortisone  0.2 mg Oral Daily  . gabapentin  600 mg Oral Daily  . insulin aspart  0-20 Units Subcutaneous TID WC  . insulin aspart  0-20 Units Subcutaneous QHS  . ipratropium-albuterol  3 mL Nebulization Q6H  . levETIRAcetam  500 mg Oral Q12H  . pantoprazole (PROTONIX) IV  40 mg Intravenous Q12H  . senna-docusate  1 tablet Oral Daily  . sodium chloride flush  10-40 mL Intracatheter Q12H   Continuous Infusions: . magnesium sulfate 1 - 4 g bolus IVPB    . piperacillin-tazobactam (ZOSYN)  IV 3.375 g (05/03/17 0629)   PRN Meds:.acetaminophen, albuterol, fluticasone, guaiFENesin-dextromethorphan, loperamide, loratadine, magnesium hydroxide, nitroGLYCERIN, [DISCONTINUED] ondansetron **OR** ondansetron (ZOFRAN) IV, oxyCODONE, sodium chloride flush   Assessment: Active Problems:   Syncope   Sepsis ([place]) 76 year old male admitted with syncopal episode and incidentally found to be anemic with microcytosis with recent workup at [facility] including upper endoscopy and colonoscopy showing no significant abnormalities   Plan: No signs of active GI bleeding present at this time and hemoglobin is stable. Capsule endoscopy was offered to him last week but patient declined. Patient's main concern today is that he would like to eat solid food He is willing to consider capsule endoscopy as I assured him that he would not have to drink a prep prior to the capsule endoscopy. Since he would like solid food today, capsule endoscopy cannot be done today. Can start solid diet today if okay with primary team Clear liquid diet after midnight If patient is willing to undergo capsule  endoscopy tomorrow, this can be arranged during the week. Continue serial CBCs and transfuse as needed No indication for upper lower endoscopy at this time with no signs of active GI bleeding. Dr. Vicente Males will be seeing the following the patient during the week for follow up   LOS: 2 days   Jimmy Antigua, MD 05/03/2017, 11:35 AM

## 2017-05-03 NOTE — Progress Notes (Signed)
NAD Cognition intact No complaints  Vitals:   05/03/17 0500 05/03/17 0600 05/03/17 0800 05/03/17 0845  BP: (!) 131/91 (!) 143/86 138/83   Pulse: (!) 106 (!) 106    Resp:      Temp:   98.3 F (36.8 C)   TempSrc:   Oral   SpO2: 98% 96%  95%  Weight:      Height:         Gen: NAD HEENT: NCAT, sclerae white Lungs: full BS, scattered rhonchi, bronchial breat sounds in R base Cardiovascular: Reg rate, no M noted Abdomen: Soft, NT, +BS Ext: no C/C/E Neuro: grossly intact  BMP Latest Ref Rng & Units 05/03/2017 05/02/2017 05/02/2017  Glucose 65 - 99 mg/dL 185(H) 262(H) -  BUN 6 - 20 mg/dL 13 12 -  Creatinine 0.61 - 1.24 mg/dL 0.81 0.86 -  Sodium 135 - 145 mmol/L 140 135 -  Potassium 3.5 - 5.1 mmol/L 3.5 3.7 3.7  Chloride 101 - 111 mmol/L 109 103 -  CO2 22 - 32 mmol/L 24 22 -  Calcium 8.9 - 10.3 mg/dL 7.5(L) 7.4(L) -    CBC Latest Ref Rng & Units 05/03/2017 05/02/2017 05/02/2017  WBC 3.8 - 10.6 K/uL 13.7(H) - -  Hemoglobin 13.0 - 18.0 g/dL 8.1(L) 9.2(L) 7.1(L)  Hematocrit 40.0 - 52.0 % 26.9(L) 30.4(L) 24.3(L)  Platelets 150 - 440 K/uL 210 - -    CXR 12/15: RLL opacity  IMPRESSION: Acute hypoxemic respiratory failure RLL CAP, NOS Anemia - suspected GI source OSA COPD without evidence of bronchospasm Former smoker  PLAN/REC: DC vancomycin Continue Pip-tazo DC systemic steroids Continue nebulized bronchodilators Transfer to MedSurg GI following After transfer, PCCM will sign off. Please call if we can be of further assistance    Merton Border, MD PCCM service Mobile [phone] Pager [phone] 05/03/2017 1:59 PM

## 2017-05-03 NOTE — Progress Notes (Signed)
Pt placed on [facility] C-4 CPAP. CPAP plugged into red outlet

## 2017-05-03 NOTE — Progress Notes (Signed)
[facility] [place] NAME: Jimmy Hill    MR#:  [id number]  DATE OF BIRTH:  [birth year]  SUBJECTIVE:   Patient was transfused yesterday and hemoglobin improved posttransfusion. Hemodynamically stable. Patient had CT angiogram of the abdomen and pelvis yesterday which was negative for acute pathology. Seen by gastroenterology this morning and plan for capsule endoscopy tomorrow.  Patient's biggest concern is that he wants to eat some solid food.  REVIEW OF SYSTEMS:    Review of Systems  Constitutional: Negative for chills and fever.  HENT: Negative for congestion and tinnitus.   Eyes: Negative for blurred vision and double vision.  Respiratory: Negative for cough, shortness of breath and wheezing.   Cardiovascular: Negative for chest pain, orthopnea and PND.  Gastrointestinal: Negative for abdominal pain, diarrhea, nausea and vomiting.  Genitourinary: Negative for dysuria and hematuria.  Neurological: Negative for dizziness, sensory change and focal weakness.  All other systems reviewed and are negative.   Nutrition: heart healthy/Carb modified Tolerating Diet: Yes Tolerating PT: Await Eval.    DRUG ALLERGIES:   Allergies  Allergen Reactions  . Cheese     Patient doesn't want any cheese    VITALS:  Blood pressure 138/83, pulse (!) 106, temperature 98.3 F (36.8 C), temperature source Oral, resp. rate 20, height '5\' 8"'  (1.727 m), weight 90.7 kg (199 lb 15.3 oz), SpO2 95 %.  PHYSICAL EXAMINATION:   Physical Exam  GENERAL:  76 y.o.-year-old patient lying in bed in no acute distress.  EYES: Pupils equal, round, reactive to light and accommodation. No scleral icterus. Extraocular muscles intact.  HEENT: Head atraumatic, normocephalic. Oropharynx and nasopharynx clear.  NECK:  Supple, no jugular venous distention. No thyroid enlargement, no tenderness.  LUNGS: Good a/e B/l, minimal wheezing, No rales, rhonchi. No use of accessory  muscles of respiration.  CARDIOVASCULAR: S1, S2 normal. No murmurs, rubs, or gallops.  ABDOMEN: Soft, nontender, nondistended. Bowel sounds present. No organomegaly or mass.  EXTREMITIES: No cyanosis, clubbing or edema b/l.    NEUROLOGIC: Cranial nerves II through XII are intact. No focal Motor or sensory deficits b/l. Globally weak.  PSYCHIATRIC: The patient is alert and oriented x 3.  SKIN: No obvious rash, lesion, or ulcer.    LABORATORY PANEL:   CBC Recent Labs  Lab 05/03/17 0420  WBC 13.7*  HGB 8.1*  HCT 26.9*  PLT 210   ------------------------------------------------------------------------------------------------------------------  Chemistries  Recent Labs  Lab 05/03/17 0420  NA 140  K 3.5  CL 109  CO2 24  GLUCOSE 185*  BUN 13  CREATININE 0.81  CALCIUM 7.5*  MG 1.8  AST 25  ALT <5*  ALKPHOS 50  BILITOT 1.1   ------------------------------------------------------------------------------------------------------------------  Cardiac Enzymes Recent Labs  Lab 04/30/17 0417  TROPONINI <0.03   ------------------------------------------------------------------------------------------------------------------  RADIOLOGY:  Dg Chest 1 View  Result Date: 05/02/2017 CLINICAL DATA:  76 year old male with sepsis.  Hypoxia. EXAM: CHEST 1 VIEW COMPARISON:  05/01/2017 and earlier. FINDINGS: Portable AP upright view at 0354 hours. Right PICC line remains in place. Tip at the lower SVC level. Continued confluent opacity at the right lung base which first appeared yesterday. Stable lung volumes. No other confluent pulmonary opacity. No pneumothorax, pulmonary edema or pleural effusion. Stable cardiac size and mediastinal contours. Visualized tracheal air column is within normal limits. IMPRESSION: 1. Persistent right lower lobe confluent opacity. Consider aspiration and/or pneumonia. 2. No new cardiopulmonary abnormality. Electronically Signed   By: Genevie Ann M.D.   On:  05/02/2017 06:46   Dg Chest Port 1 View  Result Date: 05/01/2017 CLINICAL DATA:  PICC line placement. EXAM: PORTABLE CHEST 1 VIEW COMPARISON:  05/01/2017 FINDINGS: Right-sided PICC line terminates at the expected location of cavoatrial junction. No evidence of pneumothorax. Cardiomediastinal silhouette is enlarged. Calcific atherosclerotic disease of the aorta. Mediastinal contours appear intact. Persistent airspace opacity in the right lower lobe. Osseous structures are without acute abnormality. Soft tissues are grossly normal. IMPRESSION: Status post PICC line placement.  No evidence of pneumothorax. Persistent right lower lobe airspace disease. Electronically Signed   By: Fidela Salisbury M.D.   On: 05/01/2017 14:33   Ct Angio Abd/pel W/ And/or W/o  Addendum Date: 05/02/2017   ADDENDUM REPORT: 05/02/2017 13:26 ADDENDUM: In the report, a small amount of free fluid about the spleen was mentioned. There is also small amount of free fluid in the left pericolic gutter extending towards the left side of the pelvis. There is also a small amount of free fluid adjacent to the left side of the dome of the bladder. Again, these findings are nonspecific. In addition, there is no focal wall thickening of the colon or pneumatosis to suggest bowel ischemia. Electronically Signed   By: Marybelle Killings M.D.   On: 05/02/2017 13:26   Result Date: 05/02/2017 CLINICAL DATA:  Recent back surgery.  Drop in hemoglobin. EXAM: CTA ABDOMEN AND PELVIS wITHOUT AND WITH CONTRAST TECHNIQUE: Multidetector CT imaging of the abdomen and pelvis was performed using the standard protocol during bolus administration of intravenous contrast. Multiplanar reconstructed images and MIPs were obtained and reviewed to evaluate the vascular anatomy. CONTRAST:  166m ISOVUE-370 IOPAMIDOL (ISOVUE-370) INJECTION 76% COMPARISON:  None. FINDINGS: VASCULAR Aorta: Nonaneurysmal and patent. Scattered atherosclerotic calcifications throughout the aorta.  Celiac: Patent.  Branch vessels patent. SMA: Atherosclerotic calcifications at the origin. No significant narrowing. Renals: There are atherosclerotic calcifications at the origins of both renal arteries. Significant narrowing at the origin of the right renal artery cannot be excluded. Left renal artery is patent. IMA: Moderate disease at the origin. Inflow: There are atherosclerotic calcifications throughout the iliac arterial distribution. No significant narrowing. Proximal Outflow: Right common femoral artery is patent with some plaque. There is some disease at the origin of the right superficial femoral artery. There is calcified and smooth plaque in the left common femoral artery without significant narrowing. Visualize left superficial femoral artery is patent. Veins: No obvious DVT. Review of the MIP images confirms the above findings. NON-VASCULAR Lower chest: Bibasilar patchy airspace opacities. Small bilateral pleural effusions. Hepatobiliary: The contour of the liver somewhat nodular. There is an ill-defined low-density lesion in the lateral segment of the left lobe of liver on image 15 of series 6. Nodular tiny lesion in the medial segment of the left lobe on image 13. Tiny right lobe lesion is present on image 34 of series 6. Gallbladder is somewhat distended but otherwise unremarkable. Pancreas: Unremarkable Spleen: Spleen is unremarkable. There is a small amount of fluid about the spleen. Adrenals/Urinary Tract: Multiple calculi present within the collecting system of the left kidney. Small hypodensities in both kidneys are nonspecific. There is a 4.3 cm simple cyst emanating from the lower pole of the right kidney anteriorly. Adrenal glands are within normal limits. Bladder is unremarkable. Stomach/Bowel: Prominent stool burden throughout the colon. There is no obvious mass throughout the length of the colon. Appendix is nonvisualized. No evidence of small-bowel obstruction. Stomach and duodenum are  within normal limits. Lymphatic: No abnormal retroperitoneal adenopathy. Reproductive: Within  normal limits. Other: Small amount of free fluid is present about the spleen. There is no evidence of retroperitoneal hemorrhage. Musculoskeletal: Postoperative changes from L2 through L5 fusion are noted. Mild T10 compression deformity has a chronic appearance. IMPRESSION: VASCULAR There is no evidence of retroperitoneal hemorrhage. Atherosclerotic changes throughout the abdomen and pelvis are noted. Significant narrowing at the origin of the right renal artery is suspected. Correlate with renal function and the presence of uncontrolled hypertension. NON-VASCULAR The contour of the liver is somewhat nodular suggesting early cirrhosis. There are multiple lesions within the liver. Given the above finding, malignancy is not excluded. MRI of the liver is recommended Small amount of free fluid in the abdomen. Bibasilar patchy airspace disease and small pleural effusions. Bilateral nephrolithiasis. Electronically Signed: By: Marybelle Killings M.D. On: 05/02/2017 11:18     ASSESSMENT AND PLAN:   76 year old male with past medical history of diabetes, depression, history of coronary artery disease, COPD, CHF, restless leg syndrome, seizures, history of spinal stenosis who presented to the hospital due to syncopal episode and also noted to be anemic. Yesterday patient developed some hypotension and worsening respiratory failure and noted to have sepsis secondary to pneumonia. Transferred to the ICU.  1. Acute respiratory failure with hypoxia-secondary to suspected pneumonia. -Continue O2 supplementation  - Continue IV antibiotics with Zosyn for the pneumonia. WEan O2 as tolerated.   2. Sepsis-patient met criteria given his hypotension, tachycardia, fever and elevated lactic acid and chest x-ray findings suggestive of pneumonia. -Continue IV fluids, IV Zosyn. Follow cultures returned negative so far.  3. Anemia-etiology  unclear. Patient did have guaiac positive stools. has had a recent gastroenterology workup done at [facility] which was negative for acute pathology. Seen by gastroenterology here and plan for capsule endoscopy tomorrow.  - s/p 1 unit PRBC's yesterday and Hg. Improved posttransfusion will continue to monitor. -Patient underwent a CT angiogram of his abdomen and pelvis yesterday which was negative for acute pathology.  - cont. IV protonix.   4. History of atrial fibrillation-currently rate controlled. Eliquis on hold due to the anemia.  5. History of Parkinson's disease-continue Sinemet.  6. Diabetes type 2 without complication-continue sliding scale insulin  - BS table.   7. History of seizures-continue Keppra. No acute seizure type activity.  8. Glaucoma-continue Alphagan and timolol eyedrops.  9. Neuropathy - cont. Neurontin.    10. Hyperlipidemia - cont. Atorvastatin.   All the records are reviewed and case discussed with Care Management/Social Worker. Management plans discussed with the patient, family and they are in agreement.  CODE STATUS: Full code  DVT Prophylaxis: Teds and SCDs  TOTAL TIME TAKING CARE OF THIS PATIENT: 30 minutes.   POSSIBLE D/C IN 2-3 DAYS, DEPENDING ON CLINICAL CONDITION.   Henreitta Leber M.D on 05/03/2017 at 12:29 PM  Between 7am to 6pm - Pager - [phone]  After 6pm go to [email or web address] - [employer] brewer [facility] [place] [facility]  Office  [phone]  CC: Primary care physician; Inc, [place]

## 2017-05-04 DIAGNOSIS — D509 Iron deficiency anemia, unspecified: Secondary | ICD-10-CM

## 2017-05-04 DIAGNOSIS — E538 Deficiency of other specified B group vitamins: Secondary | ICD-10-CM

## 2017-05-04 LAB — PHOSPHORUS: Phosphorus: 2.4 mg/dL — ABNORMAL LOW (ref 2.5–4.6)

## 2017-05-04 LAB — COMPREHENSIVE METABOLIC PANEL
ALT: 6 U/L — ABNORMAL LOW (ref 17–63)
AST: 28 U/L (ref 15–41)
Albumin: 2.7 g/dL — ABNORMAL LOW (ref 3.5–5.0)
Alkaline Phosphatase: 64 U/L (ref 38–126)
Anion gap: 9 (ref 5–15)
BUN: 13 mg/dL (ref 6–20)
CO2: 30 mmol/L (ref 22–32)
Calcium: 7.7 mg/dL — ABNORMAL LOW (ref 8.9–10.3)
Chloride: 101 mmol/L (ref 101–111)
Creatinine, Ser: 0.9 mg/dL (ref 0.61–1.24)
GFR calc Af Amer: >60 mL/min (ref >60)
GFR calc non Af Amer: >60 mL/min (ref >60)
Glucose, Bld: 153 mg/dL — ABNORMAL HIGH (ref 65–99)
Potassium: 2.6 mmol/L — CL (ref 3.5–5.1)
Sodium: 140 mmol/L (ref 135–145)
Total Bilirubin: 0.8 mg/dL (ref 0.3–1.2)
Total Protein: 5.6 g/dL — ABNORMAL LOW (ref 6.5–8.1)

## 2017-05-04 LAB — CBC
HCT: 28.4 % — ABNORMAL LOW (ref 40.0–52.0)
Hemoglobin: 8.7 g/dL — ABNORMAL LOW (ref 13.0–18.0)
MCH: 21.6 pg — ABNORMAL LOW (ref 26.0–34.0)
MCHC: 30.7 g/dL — ABNORMAL LOW (ref 32.0–36.0)
MCV: 70.5 fL — ABNORMAL LOW (ref 80.0–100.0)
Platelets: 219 10*3/uL (ref 150–440)
RBC: 4.02 MIL/uL — ABNORMAL LOW (ref 4.40–5.90)
RDW: 19.2 % — ABNORMAL HIGH (ref 11.5–14.5)
WBC: 18 10*3/uL — ABNORMAL HIGH (ref 3.8–10.6)

## 2017-05-04 LAB — GLUCOSE, CAPILLARY
GLUCOSE-CAPILLARY: 208 mg/dL — AB (ref 65–99)
Glucose-Capillary: 142 mg/dL — ABNORMAL HIGH (ref 65–99)
Glucose-Capillary: 209 mg/dL — ABNORMAL HIGH (ref 65–99)
Glucose-Capillary: 171 mg/dL — ABNORMAL HIGH (ref 65–99)

## 2017-05-04 LAB — MAGNESIUM: Magnesium: 1.5 mg/dL — ABNORMAL LOW (ref 1.7–2.4)

## 2017-05-04 LAB — POTASSIUM: POTASSIUM: 2.6 mmol/L — AB (ref 3.5–5.1)

## 2017-05-04 MED ORDER — MAGNESIUM CITRATE PO SOLN
1.0000 | Freq: Once | ORAL | Status: AC
  Administered 2017-05-04: 1 via ORAL
  Filled 2017-05-04: qty 296

## 2017-05-04 MED ORDER — POTASSIUM CHLORIDE 10 MEQ/100ML IV SOLN
10.0000 meq | INTRAVENOUS | Status: AC
  Administered 2017-05-04 (×3): 10 meq via INTRAVENOUS
  Filled 2017-05-04 (×3): qty 100

## 2017-05-04 MED ORDER — CITALOPRAM HYDROBROMIDE 20 MG PO TABS
20.0000 mg | ORAL_TABLET | Freq: Every day | ORAL | Status: DC
  Administered 2017-05-04 – 2017-05-06 (×3): 20 mg via ORAL
  Filled 2017-05-04 (×3): qty 1

## 2017-05-04 MED ORDER — PREGABALIN 50 MG PO CAPS
50.0000 mg | ORAL_CAPSULE | Freq: Two times a day (BID) | ORAL | Status: DC
  Administered 2017-05-04 – 2017-05-06 (×4): 50 mg via ORAL
  Filled 2017-05-04 (×4): qty 1

## 2017-05-04 MED ORDER — ROFLUMILAST 500 MCG PO TABS
500.0000 ug | ORAL_TABLET | Freq: Every day | ORAL | Status: DC
  Administered 2017-05-04 – 2017-05-06 (×3): 500 ug via ORAL
  Filled 2017-05-04 (×3): qty 1

## 2017-05-04 MED ORDER — MAGNESIUM SULFATE 4 GM/100ML IV SOLN
4.0000 g | Freq: Once | INTRAVENOUS | Status: AC
  Administered 2017-05-04: 4 g via INTRAVENOUS
  Filled 2017-05-04: qty 100

## 2017-05-04 MED ORDER — INSULIN GLARGINE 100 UNIT/ML ~~LOC~~ SOLN
10.0000 [IU] | Freq: Every day | SUBCUTANEOUS | Status: DC
  Administered 2017-05-04 – 2017-05-06 (×2): 10 [IU] via SUBCUTANEOUS
  Filled 2017-05-04 (×3): qty 0.1

## 2017-05-04 MED ORDER — POTASSIUM CHLORIDE 10 MEQ/100ML IV SOLN
10.0000 meq | INTRAVENOUS | Status: AC
  Administered 2017-05-04: 10 meq via INTRAVENOUS
  Filled 2017-05-04: qty 100

## 2017-05-04 MED ORDER — K PHOS MONO-SOD PHOS DI & MONO 155-852-130 MG PO TABS
500.0000 mg | ORAL_TABLET | ORAL | Status: AC
  Administered 2017-05-04 (×2): 500 mg via ORAL
  Filled 2017-05-04 (×2): qty 2

## 2017-05-04 MED ORDER — ACETAMINOPHEN 325 MG PO TABS
650.0000 mg | ORAL_TABLET | Freq: Four times a day (QID) | ORAL | Status: DC | PRN
  Filled 2017-05-04: qty 2

## 2017-05-04 MED ORDER — POTASSIUM CHLORIDE CRYS ER 20 MEQ PO TBCR
40.0000 meq | EXTENDED_RELEASE_TABLET | ORAL | Status: DC
  Administered 2017-05-04: 40 meq via ORAL
  Filled 2017-05-04: qty 2

## 2017-05-04 MED ORDER — POTASSIUM CHLORIDE CRYS ER 20 MEQ PO TBCR
40.0000 meq | EXTENDED_RELEASE_TABLET | Freq: Once | ORAL | Status: AC
  Administered 2017-05-04: 40 meq via ORAL
  Filled 2017-05-04: qty 2

## 2017-05-04 NOTE — Progress Notes (Signed)
Pharmacy Antibiotic Note  Jimmy Hill is a 76 y.o. male admitted on 04/30/2017 with sepsis. Pharmacy has been consulted for vancomycin and Zosyn dosing.   Plan: Will continue Zosyn EI 3.375g IV Q8hr.    Height: 5\' 8"  (172.7 cm) Weight: 199 lb 15.3 oz (90.7 kg) IBW/kg (Calculated) : 68.4  Temp (24hrs), Avg:98.4 F (36.9 C), Min:97.9 F (36.6 C), Max:98.9 F (37.2 C)  Recent Labs  Lab 04/30/17 0417  05/01/17 1031  05/02/17 0550 05/02/17 0627 05/02/17 0816 05/02/17 1424 05/02/17 1836 05/02/17 2052 05/03/17 0420 05/04/17 0502  WBC 8.2  --  17.4*  --  18.2*  --   --   --   --   --  13.7* 18.0*  CREATININE 0.81   < > 0.76  --  0.79  --   --   --  0.86  --  0.81 0.90  LATICACIDVEN  --   --  2.6*   < >  --  4.5* 4.1* 3.8*  --  4.4* 2.9*  --   VANCOTROUGH  --   --   --   --   --   --   --   --   --   --  19  --    < > = values in this interval not displayed.    Estimated Creatinine Clearance: 76.3 mL/min (by C-G formula based on SCr of 0.9 mg/dL).    Allergies  Allergen Reactions  . Cheese     Patient doesn't want any cheese    Antimicrobials this admission: Vancomycin 12/14 >> 12/16 Zosyn 12/14 >>   Dose adjustments this admission: 12/16 0430 vanc 19. Renal function stable. Continue current regimen.  Microbiology results: 12/14 BCx: NGTD 12/14 MRSA PCR: negative   Thank you for allowing pharmacy to be a part of this patient's care.  Rocky Morel 05/04/2017 8:23 AM

## 2017-05-04 NOTE — Progress Notes (Signed)
CRITICAL VALUE ALERT  Critical Value: k 2.6  Date & Time Notied:  12/17 0600  Provider Notified: Dr. Marcille Blanco   Orders Received/Actions taken: orders given see mar

## 2017-05-04 NOTE — Progress Notes (Signed)
[place] for electrolyte management   Pharmacy consulted for electrolyte management for 76 yo male patient presenting with hypokalemia.   Plan:  Potassium (mmol/L)  Date Value  05/04/2017 2.6 (LL)  12/08/2013 3.9   Magnesium (mg/dL)  Date Value  05/04/2017 1.5 (L)   Lab Results  Component Value Date   CALCIUM 7.7 (L) 05/04/2017   PHOS 2.4 (L) 05/04/2017   K 2.6, Mag 1.5, Phos 2.4  MD already ordered KCl 40 mEq PO x2 doses - one dose given already Will change remaining KCl ordered dose to KCl 10 mEq IV x4 runs; recheck K at 1800 Will replace with magnesium sulfate 4 g iv once in this 76 y/o M with atrial fibrillation and continued low Mag.  Will order KPhos neutral 500 mg tabs PO x 2 doses per policy  Check all electrolytes with AM labs.    Allergies  Allergen Reactions  . Cheese     Patient doesn't want any cheese    Patient Measurements: Height: 5\' 8"  (172.7 cm) Weight: 199 lb 15.3 oz (90.7 kg) IBW/kg (Calculated) : 68.4  Vital Signs: Temp: 97.9 F (36.6 C) (12/16 2341) Temp Source: Oral (12/16 2341) BP: 144/77 (12/16 2341) Pulse Rate: 85 (12/16 2341) Intake/Output from previous day: 12/16 0701 - 12/17 0700 In: 60 [I.V.:10; IV Piggyback:50] Out: 2250 [Urine:2250] Intake/Output from this shift: No intake/output data recorded.   Estimated Creatinine Clearance: 76.3 mL/min (by C-G formula based on SCr of 0.9 mg/dL).   Pharmacy will continue to monitor and adjust per consult.   Rayna Sexton L 05/04/2017,7:55 AM

## 2017-05-04 NOTE — Progress Notes (Signed)
[place] [facility] [place] NAME: Jimmy Hill    MR#:  [id number]  DATE OF BIRTH:  1940/10/14  SUBJECTIVE:   Hemoglobin stable posttransfusion. No other acute bleeding overnight. Noted to be hypokalemic and now is being replaced. Plan for capsule endoscopy tomorrow.  REVIEW OF SYSTEMS:    Review of Systems  Constitutional: Negative for chills and fever.  HENT: Negative for congestion and tinnitus.   Eyes: Negative for blurred vision and double vision.  Respiratory: Negative for cough, shortness of breath and wheezing.   Cardiovascular: Negative for chest pain, orthopnea and PND.  Gastrointestinal: Negative for abdominal pain, diarrhea, nausea and vomiting.  Genitourinary: Negative for dysuria and hematuria.  Neurological: Negative for dizziness, sensory change and focal weakness.  All other systems reviewed and are negative.   Nutrition: Clear Liquids Tolerating Diet: Yes Tolerating PT: Await Eval.    DRUG ALLERGIES:   Allergies  Allergen Reactions  . Cheese     Patient doesn't want any cheese    VITALS:  Blood pressure 139/85, pulse 93, temperature 98.9 F (37.2 C), temperature source Oral, resp. rate 18, height '5\' 8"'  (1.727 m), weight 90.7 kg (199 lb 15.3 oz), SpO2 94 %.  PHYSICAL EXAMINATION:   Physical Exam  GENERAL:  76 y.o.-year-old patient lying in bed in no acute distress.  EYES: Pupils equal, round, reactive to light and accommodation. No scleral icterus. Extraocular muscles intact.  HEENT: Head atraumatic, normocephalic. Oropharynx and nasopharynx clear.  NECK:  Supple, no jugular venous distention. No thyroid enlargement, no tenderness.  LUNGS: Good a/e B/l, minimal wheezing, No rales, rhonchi. No use of accessory muscles of respiration.  CARDIOVASCULAR: S1, S2 normal. No murmurs, rubs, or gallops.  ABDOMEN: Soft, nontender, nondistended. Bowel sounds present. No organomegaly or mass.  EXTREMITIES: No cyanosis,  clubbing or edema b/l.    NEUROLOGIC: Cranial nerves II through XII are intact. No focal Motor or sensory deficits b/l. Globally weak.  PSYCHIATRIC: The patient is alert and oriented x 3.  SKIN: No obvious rash, lesion, or ulcer.    LABORATORY PANEL:   CBC Recent Labs  Lab 05/04/17 0502  WBC 18.0*  HGB 8.7*  HCT 28.4*  PLT 219   ------------------------------------------------------------------------------------------------------------------  Chemistries  Recent Labs  Lab 05/04/17 0502  NA 140  K 2.6*  CL 101  CO2 30  GLUCOSE 153*  BUN 13  CREATININE 0.90  CALCIUM 7.7*  MG 1.5*  AST 28  ALT 6*  ALKPHOS 64  BILITOT 0.8   ------------------------------------------------------------------------------------------------------------------  Cardiac Enzymes Recent Labs  Lab 04/30/17 0417  TROPONINI <0.03   ------------------------------------------------------------------------------------------------------------------  RADIOLOGY:  No results found.   ASSESSMENT AND PLAN:   76 year old male with past medical history of diabetes, depression, history of coronary artery disease, COPD, CHF, restless leg syndrome, seizures, history of spinal stenosis who presented to the hospital due to syncopal episode and also noted to be anemic. Yesterday patient developed some hypotension and worsening respiratory failure and noted to have sepsis secondary to pneumonia. Transferred to the ICU.  1. Acute respiratory failure with hypoxia-secondary to pneumonia. - much improved and weaned to [place] O2.  - Continue IV antibiotics with Zosyn for the pneumonia.   2. Sepsis-patient met criteria given his hypotension, tachycardia, fever and elevated lactic acid and chest x-ray findings suggestive of pneumonia. - Much improved and now afebrile and hemodynamically stable. Continue IV Zosyn, cultures so far negative.  3. Anemia-etiology unclear. Patient did have guaiac positive stools. has  had a  recent gastroenterology workup done at [facility] which was negative for acute pathology.  - s/p 1 unit PRBC's on 05/02/17 and Hg. Improved posttransfusion and currently stable. -Patient underwent a CT angiogram of his abdomen and pelvis which was negative for acute pathology.  - cont. IV protonix. Seen by gastroenterology here and plan for capsule endoscopy tomorrow. Discussed w/ Dr. Vicente Males.   4. History of atrial fibrillation-currently rate controlled. Eliquis on hold due to the anemia.  5. History of Parkinson's disease-continue Sinemet.  6. Diabetes type 2 without complication-continue sliding scale insulin  - BS table.   7. History of seizures-continue Keppra. No acute seizure type activity.  8. Glaucoma-continue Alphagan and timolol eyedrops.  9. Neuropathy - cont. Neurontin.    10. Hyperlipidemia - cont. Atorvastatin.   All the records are reviewed and case discussed with Care Management/Social Worker. Management plans discussed with the patient, family and they are in agreement.  CODE STATUS: Full code  DVT Prophylaxis: Teds and SCDs  TOTAL TIME TAKING CARE OF THIS PATIENT: 30 minutes.   POSSIBLE D/C IN 1-2 DAYS, DEPENDING ON CLINICAL CONDITION.   Henreitta Leber M.D on 05/04/2017 at 3:04 PM  Between 7am to 6pm - Pager - [phone]  After 6pm go to [email or web address] - [employer] [facility]  Office  [phone]  CC: Primary care physician; Inc, [place]

## 2017-05-04 NOTE — Progress Notes (Signed)
Jimmy Hill , MD [address] [place], [place], [place], [address] [address], [place], [place], [place], [address] Phone: [phone]  Fax: [phone]   Jimmy Hill is being followed for anemia    Subjective: No complaints    Objective: Vital signs in last 24 hours: Vitals:   05/03/17 2100 05/03/17 2200 05/03/17 2341 05/04/17 0755  BP: 137/68 (!) 143/82 (!) 144/77 139/85  Pulse: (!) 101 98 85 93  Resp: 18 17 18 18   Temp:   97.9 F (36.6 C) 98.9 F (37.2 C)  TempSrc:   Oral Oral  SpO2: 100% 92% 93% 94%  Weight:      Height:       Weight change:   Intake/Output Summary (Last 24 hours) at 05/04/2017 1119 Last data filed at 05/04/2017 1042 Gross per 24 hour  Intake 650 ml  Output 2550 ml  Net -1900 ml     Exam: Heart:: Regular rate and rhythm, S1S2 present or without murmur or extra heart sounds Lungs: normal, clear to auscultation and clear to auscultation and percussion Abdomen: soft, nontender, normal bowel sounds   Lab Results: CBC Latest Ref Rng & Units 05/04/2017 05/03/2017 05/02/2017  WBC 3.8 - 10.6 K/uL 18.0(H) 13.7(H) -  Hemoglobin 13.0 - 18.0 g/dL 8.7(L) 8.1(L) 9.2(L)  Hematocrit 40.0 - 52.0 % 28.4(L) 26.9(L) 30.4(L)  Platelets 150 - 440 K/uL 219 210 -    Micro Results: Recent Results (from the past 240 hour(s))  MRSA PCR Screening     Status: None   Collection Time: 05/01/17 10:28 AM  Result Value Ref Range Status   MRSA by PCR NEGATIVE NEGATIVE Final    Comment:        The GeneXpert MRSA Assay (FDA approved for NASAL specimens only), is one component of a comprehensive MRSA colonization surveillance program. It is not intended to diagnose MRSA infection nor to guide or monitor treatment for MRSA infections.   Culture, blood (x 2)     Status: None (Preliminary result)   Collection Time: 05/01/17 10:31 AM  Result Value Ref Range Status   Specimen Description BLOOD RIGHT HAND  Final   Special Requests   Final    BOTTLES  DRAWN AEROBIC AND ANAEROBIC Blood Culture adequate volume   Culture NO GROWTH 3 DAYS  Final   Report Status PENDING  Incomplete  Culture, blood (x 2)     Status: None (Preliminary result)   Collection Time: 05/01/17 10:33 AM  Result Value Ref Range Status   Specimen Description BLOOD LEFT HAND  Final   Special Requests   Final    BOTTLES DRAWN AEROBIC AND ANAEROBIC Blood Culture adequate volume   Culture NO GROWTH 3 DAYS  Final   Report Status PENDING  Incomplete   Studies/Results: No results found. Medications: I have reviewed the patient's current medications. Scheduled Meds: . atorvastatin  80 mg Oral QHS  . brimonidine  1 drop Both Eyes BID   And  . timolol  1 drop Both Eyes BID  . carbidopa-levodopa  1 tablet Oral TID  . carbidopa-levodopa  2 tablet Oral Q breakfast  . docusate sodium  100 mg Oral BID  . fludrocortisone  0.2 mg Oral Daily  . gabapentin  600 mg Oral Daily  . insulin aspart  0-20 Units Subcutaneous TID WC  . insulin aspart  0-20 Units Subcutaneous QHS  . ipratropium-albuterol  3 mL Nebulization Q6H  . levETIRAcetam  500 mg Oral Q12H  . pantoprazole (PROTONIX)  IV  40 mg Intravenous Q12H  . phosphorus  500 mg Oral Q4H  . senna-docusate  1 tablet Oral Daily  . sodium chloride flush  10-40 mL Intracatheter Q12H   Continuous Infusions: . magnesium sulfate 1 - 4 g bolus IVPB 4 g (05/04/17 1051)  . piperacillin-tazobactam (ZOSYN)  IV Stopped (05/04/17 0900)  . potassium chloride 10 mEq (05/04/17 1054)   PRN Meds:.acetaminophen, albuterol, fluticasone, guaiFENesin-dextromethorphan, loperamide, loratadine, magnesium hydroxide, nitroGLYCERIN, [DISCONTINUED] ondansetron **OR** ondansetron (ZOFRAN) IV, oxyCODONE, sodium chloride flush   Assessment: Active Problems:   Syncope   Sepsis (HCC)   Jimmy Hill is a 76 y.o. y/o male admitted with a syncopal episode and incidentally found to be severely anemic with significant microcytosis, iron and b12 deficiency  .    He has had recent evaluation at [facility] in April 17, 2017 with an upper endoscopy and colonoscopy which showed no significant abnormality to account for this degree of anemia.  He has not had any small bowel evaluation. .  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   Plan:  1. Capsule study of the small bowel - tomorrow morning.1 bottle of Magnesium Citrate at 8 pm tonight   2. Replace IV iron for deficiency and b 12 .   Risks, benefits, alternatives of Givens capsule discussed with patient to include but not limited to the rare risk of Given's capsule becoming lodged in the GI tract requiring surgical removal.  The patient agrees with this plan & consent will be obtained.    LOS: 3 days   Jimmy Bellows, MD 05/04/2017, 11:19 AM

## 2017-05-04 NOTE — Progress Notes (Signed)
[place] for electrolyte management   Pharmacy consulted for electrolyte management for 76 yo male patient presenting with hypokalemia.   Labs:  Potassium (mmol/L)  Date Value  05/04/2017 2.6 (LL)  12/08/2013 3.9   Magnesium (mg/dL)  Date Value  05/04/2017 1.5 (L)   Lab Results  Component Value Date   CALCIUM 7.7 (L) 05/04/2017   PHOS 2.4 (L) 05/04/2017   Plan: K = 2.6 remains low this evening despite repletion. Mg has been replaced.  Will order KCl 40 mEq PO and 30 mEq IV and recheck all electrolytes with AM labs tomorrow.  Allergies  Allergen Reactions  . Cheese     Patient doesn't want any cheese    Estimated Creatinine Clearance: 76.3 mL/min (by C-G formula based on SCr of 0.9 mg/dL).   Pharmacy will continue to monitor and adjust per consult.   Lenis Noon, PharmD Clinical Pharmacist 05/04/2017,7:28 PM

## 2017-05-05 ENCOUNTER — Encounter
Admission: EM | Disposition: A | Discharge status: Home Health | Payer: Self-pay | Source: Home / Self Care | Attending: Specialist

## 2017-05-05 LAB — GLUCOSE, CAPILLARY
GLUCOSE-CAPILLARY: 129 mg/dL — AB (ref 65–99)
Glucose-Capillary: 107 mg/dL — ABNORMAL HIGH (ref 65–99)
Glucose-Capillary: 144 mg/dL — ABNORMAL HIGH (ref 65–99)
Glucose-Capillary: 142 mg/dL — ABNORMAL HIGH (ref 65–99)

## 2017-05-05 LAB — BASIC METABOLIC PANEL
Anion gap: 8 (ref 5–15)
BUN: 7 mg/dL (ref 6–20)
CO2: 33 mmol/L — ABNORMAL HIGH (ref 22–32)
CREATININE: 0.87 mg/dL (ref 0.61–1.24)
Calcium: 7.8 mg/dL — ABNORMAL LOW (ref 8.9–10.3)
Chloride: 98 mmol/L — ABNORMAL LOW (ref 101–111)
GFR calc Af Amer: >60 mL/min (ref >60)
GLUCOSE: 120 mg/dL — AB (ref 65–99)
POTASSIUM: 3.2 mmol/L — AB (ref 3.5–5.1)
SODIUM: 139 mmol/L (ref 135–145)

## 2017-05-05 LAB — CBC
HEMATOCRIT: 28.7 % — AB (ref 40.0–52.0)
Hemoglobin: 8.8 g/dL — ABNORMAL LOW (ref 13.0–18.0)
MCH: 21.6 pg — ABNORMAL LOW (ref 26.0–34.0)
MCHC: 30.6 g/dL — AB (ref 32.0–36.0)
MCV: 70.6 fL — ABNORMAL LOW (ref 80.0–100.0)
PLATELETS: 191 10*3/uL (ref 150–440)
RBC: 4.06 MIL/uL — ABNORMAL LOW (ref 4.40–5.90)
RDW: 18.8 % — AB (ref 11.5–14.5)
WBC: 8.1 10*3/uL (ref 3.8–10.6)

## 2017-05-05 LAB — MAGNESIUM: MAGNESIUM: 1.6 mg/dL — AB (ref 1.7–2.4)

## 2017-05-05 LAB — PHOSPHORUS: PHOSPHORUS: 3.2 mg/dL (ref 2.5–4.6)

## 2017-05-05 LAB — POTASSIUM: Potassium: 3.2 mmol/L — ABNORMAL LOW (ref 3.5–5.1)

## 2017-05-05 SURGERY — IMAGING PROCEDURE, GI TRACT, INTRALUMINAL, VIA CAPSULE

## 2017-05-05 MED ORDER — IPRATROPIUM-ALBUTEROL 0.5-2.5 (3) MG/3ML IN SOLN
3.0000 mL | Freq: Three times a day (TID) | RESPIRATORY_TRACT | Status: DC
  Administered 2017-05-05 – 2017-05-06 (×5): 3 mL via RESPIRATORY_TRACT
  Filled 2017-05-05 (×5): qty 3

## 2017-05-05 MED ORDER — POTASSIUM CHLORIDE CRYS ER 20 MEQ PO TBCR
40.0000 meq | EXTENDED_RELEASE_TABLET | Freq: Once | ORAL | Status: AC
  Administered 2017-05-06: 40 meq via ORAL
  Filled 2017-05-05: qty 2

## 2017-05-05 MED ORDER — MAGNESIUM SULFATE 2 GM/50ML IV SOLN
2.0000 g | Freq: Once | INTRAVENOUS | Status: AC
  Administered 2017-05-05: 2 g via INTRAVENOUS
  Filled 2017-05-05: qty 50

## 2017-05-05 MED ORDER — POTASSIUM CHLORIDE 20 MEQ PO PACK
40.0000 meq | PACK | Freq: Once | ORAL | Status: AC
  Administered 2017-05-05: 40 meq via ORAL
  Filled 2017-05-05: qty 2

## 2017-05-05 NOTE — Progress Notes (Signed)
Placed patient on 1l [place] after neb tx

## 2017-05-05 NOTE — Progress Notes (Signed)
[place] for electrolyte management   Pharmacy consulted for electrolyte management for 76 yo male patient presenting with hypokalemia.   Labs:  Potassium (mmol/L)  Date Value  05/05/2017 3.2 (L)  12/08/2013 3.9   Magnesium (mg/dL)  Date Value  05/05/2017 1.6 (L)   Lab Results  Component Value Date   CALCIUM 7.8 (L) 05/05/2017   PHOS 3.2 05/05/2017   Plan: 12/18 AM: K = 3.2, Mag 1.6.  Will order KCl 40 mEq PO x 1 dose and Mag Sulfate 2g IV X dose.  Will recheck K+ this evening and recheck all electrolytes with AM labs tomorrow.  Allergies  Allergen Reactions  . Cheese     Patient doesn't want any cheese    Estimated Creatinine Clearance: 79 mL/min (by C-G formula based on SCr of 0.87 mg/dL).   Pharmacy will continue to monitor and adjust per consult.   Pernell Dupre, PharmD, BCPS Clinical Pharmacist 05/05/2017 7:20 AM

## 2017-05-05 NOTE — Progress Notes (Signed)
[place] for electrolyte management   Pharmacy consulted for electrolyte management for 76 yo male patient presenting with hypokalemia.   Labs:  Potassium (mmol/L)  Date Value  05/05/2017 3.2 (L)  12/08/2013 3.9   Magnesium (mg/dL)  Date Value  05/05/2017 1.6 (L)   Lab Results  Component Value Date   CALCIUM 7.8 (L) 05/05/2017   PHOS 3.2 05/05/2017   Plan: K = 3.2 this evening. Will order KCl 40 mEq PO once and recheck electrolytes with AM labs tomorrow.  Allergies  Allergen Reactions  . Cheese     Patient doesn't want any cheese    Estimated Creatinine Clearance: 79 mL/min (by C-G formula based on SCr of 0.87 mg/dL).   Pharmacy will continue to monitor and adjust per consult.   Lenis Noon, PharmD, BCPS Clinical Pharmacist 05/05/2017 6:46 PM

## 2017-05-05 NOTE — Progress Notes (Addendum)
[place] at [place] NAME: Jimmy Hill    MR#:  [id number]  DATE OF BIRTH:  [birth year]  SUBJECTIVE:   Patient getting capsule endoscopy today. No acute bleeding overnight. Patient did have stool today which was brown and not melanotic. Denies any chest pains, shortness of breath. No other acute events overnight.  REVIEW OF SYSTEMS:    Review of Systems  Constitutional: Negative for chills and fever.  HENT: Negative for congestion and tinnitus.   Eyes: Negative for blurred vision and double vision.  Respiratory: Negative for cough, shortness of breath and wheezing.   Cardiovascular: Negative for chest pain, orthopnea and PND.  Gastrointestinal: Negative for abdominal pain, diarrhea, nausea and vomiting.  Genitourinary: Negative for dysuria and hematuria.  Neurological: Negative for dizziness, sensory change and focal weakness.  All other systems reviewed and are negative.   Nutrition: Heart Healthy/Carb control.  Tolerating Diet: Yes Tolerating PT: Await Eval.    DRUG ALLERGIES:   Allergies  Allergen Reactions  . Cheese     Patient doesn't want any cheese    VITALS:  Blood pressure 112/70, pulse 90, temperature 97.8 F (36.6 C), temperature source Oral, resp. rate 18, height _0  (1.727 m), weight 90.7 kg (199 lb 15.3 oz), SpO2 97 %.  PHYSICAL EXAMINATION:   Physical Exam  GENERAL:  76 y.o.-year-old patient lying in bed in no acute distress.  EYES: Pupils equal, round, reactive to light and accommodation. No scleral icterus. Extraocular muscles intact.  HEENT: Head atraumatic, normocephalic. Oropharynx and nasopharynx clear.  NECK:  Supple, no jugular venous distention. No thyroid enlargement, no tenderness.  LUNGS: Good a/e B/l, minimal wheezing, No rales, rhonchi. No use of accessory muscles of respiration.  CARDIOVASCULAR: S1, S2 normal. No murmurs, rubs, or gallops.  ABDOMEN: Soft, nontender, nondistended. Bowel sounds  present. No organomegaly or mass.  EXTREMITIES: No cyanosis, clubbing or edema b/l.    NEUROLOGIC: Cranial nerves II through XII are intact. No focal Motor or sensory deficits b/l. Globally weak.  PSYCHIATRIC: The patient is alert and oriented x 3.  SKIN: No obvious rash, lesion, or ulcer.    LABORATORY PANEL:   CBC Recent Labs  Lab 05/05/17 0432  WBC 8.1  HGB 8.8*  HCT 28.7*  PLT 191   ------------------------------------------------------------------------------------------------------------------  Chemistries  Recent Labs  Lab 05/04/17 0502  05/05/17 0432  NA 140  --  139  K 2.6*   < > 3.2*  CL 101  --  98*  CO2 30  --  33*  GLUCOSE 153*  --  120*  BUN 13  --  7  CREATININE 0.90  --  0.87  CALCIUM 7.7*  --  7.8*  MG 1.5*  --  1.6*  AST 28  --   --   ALT 6*  --   --   ALKPHOS 64  --   --   BILITOT 0.8  --   --    < > = values in this interval not displayed.   ------------------------------------------------------------------------------------------------------------------  Cardiac Enzymes Recent Labs  Lab 04/30/17 0417  TROPONINI <0.03   ------------------------------------------------------------------------------------------------------------------  RADIOLOGY:  No results found.   ASSESSMENT AND PLAN:   76 year old male with past medical history of diabetes, depression, history of coronary artery disease, COPD, CHF, restless leg syndrome, seizures, history of spinal stenosis who presented to the hospital due to syncopal episode and also noted to be anemic. Yesterday patient developed some hypotension and worsening respiratory failure and  noted to have sepsis secondary to pneumonia. Transferred to the ICU.  1. Acute respiratory failure with hypoxia-secondary to pneumonia. - much improved and weaned to  O2.  - cont. IV Zosyn and will switch to Oral Augmentin upon discharge.    2. Sepsis-patient met criteria given his hypotension, tachycardia, fever  and elevated lactic acid and chest x-ray findings suggestive of pneumonia. - Much improved and now afebrile and hemodynamically stable. Continue IV Zosyn, cultures so far negative.  3. Anemia-etiology unclear. Patient did have guaiac positive stools. has had a recent gastroenterology workup done at [facility] which was negative for acute pathology.  - s/p 1 unit PRBC's on 05/02/17 and Hg. Improved posttransfusion and currently stable. -Patient underwent a CT angiogram of his abdomen and pelvis which was negative for acute pathology.  - cont. IV protonix. Seen by gastroenterology getting Capsule Endoscopy today and will be read tomorrow.   4. History of atrial fibrillation-currently rate controlled.  -If capsule endoscopy is negative we'll need to discuss with cardiology about reinitiating anticoagulation with Eliquis  5. History of Parkinson's disease-continue Sinemet.  6. Diabetes type 2 without complication-continue sliding scale insulin  - BS table.   7. History of seizures-continue Keppra. No acute seizure type activity.  8. Glaucoma-continue Alphagan and timolol eyedrops.  9. Neuropathy - cont. Neurontin.    10. Hyperlipidemia - cont. Atorvastatin.   Will get PT consult to assess Mobility.   All the records are reviewed and case discussed with Care Management/Social Worker. Management plans discussed with the patient, family and they are in agreement.  CODE STATUS: Full code  DVT Prophylaxis: Teds and SCDs  TOTAL TIME TAKING CARE OF THIS PATIENT: 30 minutes.   POSSIBLE D/C IN 1-2 DAYS, DEPENDING ON CLINICAL CONDITION.   Henreitta Leber M.D on 05/05/2017 at 5:06 PM  Between 7am to 6pm - Pager - [phone]  After 6pm go to [email or web address] - [employer] [facility]  Office  [phone]  CC: Primary care physician; Inc, [place]

## 2017-05-06 ENCOUNTER — Encounter: Payer: Self-pay | Admitting: Gastroenterology

## 2017-05-06 LAB — CBC
HEMATOCRIT: 28.8 % — AB (ref 40.0–52.0)
Hemoglobin: 8.8 g/dL — ABNORMAL LOW (ref 13.0–18.0)
MCH: 21.6 pg — AB (ref 26.0–34.0)
MCHC: 30.6 g/dL — AB (ref 32.0–36.0)
MCV: 70.5 fL — AB (ref 80.0–100.0)
Platelets: 178 10*3/uL (ref 150–440)
RBC: 4.08 MIL/uL — ABNORMAL LOW (ref 4.40–5.90)
RDW: 19.5 % — AB (ref 11.5–14.5)
WBC: 7.8 10*3/uL (ref 3.8–10.6)

## 2017-05-06 LAB — CULTURE, BLOOD (ROUTINE X 2)
CULTURE: NO GROWTH
CULTURE: NO GROWTH
Special Requests: ADEQUATE
Special Requests: ADEQUATE

## 2017-05-06 LAB — BASIC METABOLIC PANEL
Anion gap: 7 (ref 5–15)
BUN: 8 mg/dL (ref 6–20)
CHLORIDE: 100 mmol/L — AB (ref 101–111)
CO2: 34 mmol/L — AB (ref 22–32)
CREATININE: 0.9 mg/dL (ref 0.61–1.24)
Calcium: 8 mg/dL — ABNORMAL LOW (ref 8.9–10.3)
GFR calc Af Amer: >60 mL/min (ref >60)
GFR calc non Af Amer: >60 mL/min (ref >60)
GLUCOSE: 115 mg/dL — AB (ref 65–99)
POTASSIUM: 3.2 mmol/L — AB (ref 3.5–5.1)
Sodium: 141 mmol/L (ref 135–145)

## 2017-05-06 LAB — GLUCOSE, CAPILLARY
GLUCOSE-CAPILLARY: 185 mg/dL — AB (ref 65–99)
Glucose-Capillary: 128 mg/dL — ABNORMAL HIGH (ref 65–99)
Glucose-Capillary: 133 mg/dL — ABNORMAL HIGH (ref 65–99)

## 2017-05-06 LAB — PHOSPHORUS: Phosphorus: 3.1 mg/dL (ref 2.5–4.6)

## 2017-05-06 LAB — MAGNESIUM: Magnesium: 1.7 mg/dL (ref 1.7–2.4)

## 2017-05-06 MED ORDER — MAGNESIUM SULFATE 2 GM/50ML IV SOLN
2.0000 g | Freq: Once | INTRAVENOUS | Status: AC
  Administered 2017-05-06: 2 g via INTRAVENOUS
  Filled 2017-05-06: qty 50

## 2017-05-06 MED ORDER — FERROUS SULFATE 325 (65 FE) MG PO TBEC: 325.0000 mg | DELAYED_RELEASE_TABLET | Freq: Two times a day (BID) | ORAL | 1 refills | Status: DC

## 2017-05-06 MED ORDER — VITAMIN B-12 1000 MCG PO TABS
500.0000 ug | ORAL_TABLET | Freq: Every day | ORAL | Status: DC
  Administered 2017-05-06: 500 ug via ORAL
  Filled 2017-05-06: qty 1

## 2017-05-06 MED ORDER — SODIUM CHLORIDE 0.9 % IV SOLN
200.0000 mg | Freq: Once | INTRAVENOUS | Status: AC
  Administered 2017-05-06: 200 mg via INTRAVENOUS
  Filled 2017-05-06 (×2): qty 10

## 2017-05-06 MED ORDER — AMOXICILLIN-POT CLAVULANATE 875-125 MG PO TABS: 1.0000 | ORAL_TABLET | Freq: Two times a day (BID) | ORAL | 0 refills | Status: AC

## 2017-05-06 MED ORDER — POTASSIUM CHLORIDE 20 MEQ PO PACK
40.0000 meq | PACK | Freq: Once | ORAL | Status: AC
  Administered 2017-05-06: 40 meq via ORAL
  Filled 2017-05-06: qty 2

## 2017-05-06 MED ORDER — PREDNISONE 10 MG PO TABS: ORAL_TABLET | ORAL | 0 refills | Status: DC

## 2017-05-06 MED ORDER — CYANOCOBALAMIN 500 MCG PO TABS: 500.0000 ug | ORAL_TABLET | Freq: Every day | ORAL | 1 refills | Status: DC

## 2017-05-06 NOTE — Progress Notes (Signed)
Physical Therapy Evaluation Patient Details Name: Jimmy Hill MRN: [id number] DOB: 1941-03-11 Today's Date: 05/06/2017   History of Present Illness  76 y/o male admitted after syncopal episode in bathroom, laceration to forehead.  Pt with h/o coronary artery disease status post MI with stent placement, Parkinson's disease, COPD and diabetes.  Pt admitted with anemia and guaiac positive stool.   Clinical Impression  PT re-evaluation performed this date.  Pt presents to PT with generalized weakness, decreased endurance, and balance deficits and would benefit from acute PT services to address objective findings.  Pt lives alone and participates with the [facility] program.  Pt receives in home assistance 2x/day for 3 days per week and attends [facility] program 3 x/week.  Pt is motivated to go home and was able to ambulate with RW for 150' and stand by assist.  This distance is longer than pt usually walks at home.  Recommend home with HHPT for post acute needs.    Follow Up Recommendations Home health PT    Equipment Recommendations  None recommended by PT    Recommendations for Other Services       Precautions / Restrictions Precautions Precautions: Fall Restrictions Weight Bearing Restrictions: No      Mobility  Bed Mobility Overal bed mobility: Modified Independent      General bed mobility comments: Pt able to transfer supine<>sit with out physical assist, HOB elevated at time of transfer.  Transfers Overall transfer level: Needs assistance Equipment used: Rolling walker (2 wheeled) Transfers: Sit to/from Stand Sit to Stand: Supervision    General transfer comment: Pt rises into standing from low bed with supervision assist for balance, pt bracing legs against bed for stability and able to self correct posterior sway in standing.  Ambulation/Gait Ambulation/Gait assistance: Supervision Ambulation Distance (Feet): 150 Feet Assistive device: Rolling walker (2 wheeled) Gait  Pattern/deviations: Step-through pattern Gait velocity: steady pace   General Gait Details: Pt determined to ambulate around nursing station and became fatigued during last 56' with c/o arm weakness and general decreased endurance.  Stairs     Wheelchair Mobility    Modified Rankin (Stroke Patients Only)       Balance Overall balance assessment: History of Falls;Needs assistance Sitting-balance support: Feet supported Sitting balance-Leahy Scale: Good   Postural control: Posterior lean Standing balance support: Bilateral upper extremity supported;During functional activity Standing balance-Leahy Scale: Fair Standing balance comment: Pt with balance reactions and able to recover/maintain with external perturbance.          Pertinent Vitals/Pain Pain Assessment: No/denies pain    Home Living Family/patient expects to be discharged to:: Private residence Living Arrangements: Alone(dog "Peanut") Available Help at Discharge: Personal care attendant([facility]ACE) Type of Home: Apartment Home Access: Ramped entrance     Home Layout: One level Home Equipment: Wheelchair - manual;Wheelchair - power;Youth worker - 2 wheels;Walker - 4 wheels      Prior Function Level of Independence: Needs assistance   Gait / Transfers Assistance Needed: Uses manual w/c or electric scooter for longer distances or when not feeling well; able to ambulate short distances with RW and Mod I assist.  ADL's / Homemaking Assistance Needed: Aide BID x 2 hours, 3 days per week.  Comments: Increased support at home available if needed.     Hand Dominance        Extremity/Trunk Assessment   Upper Extremity Assessment Upper Extremity Assessment: Generalized weakness    Lower Extremity Assessment Lower Extremity Assessment: Generalized weakness    Cervical /  Trunk Assessment Cervical / Trunk Assessment: Kyphotic  Communication   Communication: No difficulties  Cognition  Arousal/Alertness: Awake/alert Behavior During Therapy: WFL for tasks assessed/performed Overall Cognitive Status: Within Functional Limits for tasks assessed        General Comments: Follows all commands, generally plesant this date.      General Comments General comments (skin integrity, edema, etc.): Forhead laceration with steri-strips intact; ambulates on room air with O2 sats at 92-95% on room air at end of session.    Exercises Other Exercises Other Exercises: Functional Mobility training: bed mobiltiy and transfers Other Exercises: Pt education: maintaining seated rest prior to standing due to hypotension Other Exercises: Therex: ambulation, endurance training   Assessment/Plan    PT Assessment Patient needs continued PT services  PT Problem List Decreased strength;Decreased range of motion;Decreased activity tolerance;Decreased balance;Decreased mobility;Decreased coordination;Decreased safety awareness       PT Treatment Interventions DME instruction;Gait training;Functional mobility training;Therapeutic exercise;Therapeutic activities;Balance training;Patient/family education    PT Goals (Current goals can be found in the Care Plan section)  Acute Rehab PT Goals Patient Stated Goal: go home PT Goal Formulation: With patient Time For Goal Achievement: 05/13/17 Potential to Achieve Goals: Good    Frequency Min 2X/week   Barriers to discharge Decreased caregiver support Lives alone    Co-evaluation        AM-PAC PT "6 Clicks" Daily Activity  Outcome Measure Difficulty turning over in bed (including adjusting bedclothes, sheets and blankets)?: None Difficulty moving from lying on back to sitting on the side of the bed? : None Difficulty sitting down on and standing up from a chair with arms (e.g., wheelchair, bedside commode, etc,.)?: A Little Help needed moving to and from a bed to chair (including a wheelchair)?: A Little Help needed walking in hospital  room?: A Little Help needed climbing 3-5 steps with a railing? : A Little 6 Click Score: 20    End of Session Equipment Utilized During Treatment: Gait belt Activity Tolerance: Patient tolerated treatment well Patient left: in chair;with call bell/phone within reach;with family/visitor present Nurse Communication: Mobility status PT Visit Diagnosis: Muscle weakness (generalized) (M62.81);Difficulty in walking, not elsewhere classified (R26.2)    Time: 2774-1287 PT Time Calculation (min) (ACUTE ONLY): 28 min   Charges:   PT Evaluation $PT Re-evaluation: 1 Re-eval PT Treatments $Therapeutic Exercise: 8-22 mins   PT G Codes:   PT G-Codes **NOT FOR INPATIENT CLASS** Functional Assessment Tool Used: AM-PAC 6 Clicks Basic Mobility Functional Limitation: Mobility: Walking and moving around Mobility: Walking and Moving Around Current Status (O6767): At least 20 percent but less than 40 percent impaired, limited or restricted Mobility: Walking and Moving Around Goal Status [phone]): At least 1 percent but less than 20 percent impaired, limited or restricted    [employer], PT 05/06/2017, 1:28 PM

## 2017-05-06 NOTE — Progress Notes (Signed)
[facility] for electrolyte management   Pharmacy consulted for electrolyte management for 76 yo male patient presenting with hypokalemia.   Labs:  Potassium (mmol/L)  Date Value  05/06/2017 3.2 (L)  12/08/2013 3.9   Magnesium (mg/dL)  Date Value  05/06/2017 1.7   Lab Results  Component Value Date   CALCIUM 8.0 (L) 05/06/2017   PHOS 3.1 05/06/2017   Plan: 12/19 AM K = 3.2 and Mg 1.7. Will replace with Mag Sulfate IV 2g and KCL 53mEq PO x 1 dose. Will recheck electrolytes with AM labs tomorrow.  Allergies  Allergen Reactions  . Cheese     Patient doesn't want any cheese    Estimated Creatinine Clearance: 76.3 mL/min (by C-G formula based on SCr of 0.9 mg/dL).   Pharmacy will continue to monitor and adjust per consult.   Pernell Dupre, PharmD, BCPS Clinical Pharmacist 05/06/2017 7:59 AM

## 2017-05-06 NOTE — Progress Notes (Signed)
Patient stated that he does not want oxygen to go home with. Notified MD.

## 2017-05-06 NOTE — Discharge Summary (Signed)
[facility] NAME: Jimmy Hill    MR#:  [id number]  [place] OF BIRTH:  Oct 27, 1940  DATE OF ADMISSION:  04/30/2017 ADMITTING PHYSICIAN: Vaughan Basta, MD  DATE OF DISCHARGE: 05/06/2017  PRIMARY CARE PHYSICIAN: [facility] [place]    ADMISSION DIAGNOSIS:  Minor head injury, initial encounter [S09.90XA] Fall, initial encounter [W19.XXXA] Laceration of scalp, initial encounter [S01.01XA] Symptomatic anemia [D64.9] Syncope, unspecified syncope type [R55] Sepsis ([place]) [A41.9]  DISCHARGE DIAGNOSIS:  Active Problems:   Syncope   Sepsis ([place])   SECONDARY DIAGNOSIS:   Past Medical History:  Diagnosis Date  . Allergic rhinitis   . Anginal pain ([place])   . Arthritis   . Autonomic dysreflexia   . Back pain    CHRONIC LBP,STENOSIS  . CHF (congestive heart failure) ([place])   . Chronic kidney disease    STAGE 3  . COPD (chronic obstructive pulmonary disease) ([place])   . Coronary artery disease   . Depression   . Diabetes ([place])   . Diabetes mellitus without complication ([place])   . DJD (degenerative joint disease)   . Dysphagia   . Dysrhythmia    A-fib, now converted  . Edema    FEET/ANKLES  . GERD (gastroesophageal reflux disease)   . Glaucoma   . H/O orthostatic hypotension   . Hypercholesterolemia   . Myocardial infarction ([place]) 2000  . Parkinson's disease ([place])   . Peripheral vascular disease ([place])   . Pneumonia    history of   . Polyneuropathy   . RLS (restless legs syndrome)   . Seizures ([place])    when in rehab for his hip  . Sleep apnea   . Smoker   . Spinal stenosis   . Strabismic amblyopia   . Tinnitus     HOSPITAL COURSE:   76 year old male with past medical history of diabetes, depression, history of coronary artery disease, COPD, CHF, restless leg syndrome, seizures, history of spinal stenosis who presented to the hospital due to syncopal episode and also noted to be anemic.  Yesterday patient developed some hypotension and worsening respiratory failure and noted to have sepsis secondary to pneumonia. Transferred to the ICU.  1. Acute respiratory failure with hypoxia- this was secondary to pneumonia. -Patient was transferred to the ICU intermittently placed on BiPAP. Patient was given IV antibiotics with Zosyn. He has significantly improved since then. He was ambulated on room air and was mildly hypoxic and O2 stats were in the high 80s to low 90s. He did not want home oxygen at home. He's being discharged on oral Augmentin for an additional few days.   2. Sepsis-patient met criteria given his hypotension, tachycardia, fever and elevated lactic acid and chest x-ray findings suggestive of pneumonia. - Much improved and now afebrile and hemodynamically stable. Patient was on IV Zosyn, not being discharged on oral Augmentin. His cultures are negative  3. Anemia- patient was significantly anemic while in the hospital. This was a microcytic anemia. Patient had a recent GI workup done at [facility] which was negative for acute pathology. He did have heme positive stools. -He underwent a capsule endoscopy here in this hospital which showed small bowel polyps and some AVMs but no evidence of acute bleeding. -Patient is on Eliquis which is currently being discontinued. This is to be further addressed with his cardiologist as an outpatient. Patient's hemoglobin has remained stable posttransfusion and he's had no further bleeding over the past 72 hours.  -  Patient will continue oral Protonix and follow up with gastroenterology as an outpatient. -Patient was noted to be iron deficient and therefore was given a dose of IV iron and is being discharged on oral supplements. His B12 level was also low and he's being discharged on oral B12 supplements.  4. History of atrial fibrillation- patient remained rate controlled on the hospital. He will continue his low-dose Cardizem. -Eliquis is  currently being discontinued. Patient will follow-up with cardiology and have further discussion about restarting his anticoagulation if doesn't have any further bleeding.  5. History of Parkinson's disease- pt. Will continue Sinemet.  6. Diabetes type 2 without complication-while in the hospital patient was maintained on sliding scale insulin, but he will resume his Lantus and metformin upon discharge.  7. History of seizures- he will continue Keppra. No acute seizure type activity while in the hospital.  8. Glaucoma- he will continue Alphagan and timolol eyedrops.  9. Neuropathy - he will cont. Neurontin, Lyrica.    10. Hyperlipidemia - he will cont. Atorvastatin.   Patient is followed by the pace program and he will continue that. He is also being discharged home with home health physical therapy and nursing services. Patient refused home oxygen.  DISCHARGE CONDITIONS:   Stable  CONSULTS OBTAINED:  Treatment Team:  Corey Skains, MD Jonathon Bellows, MD Isaias Cowman, MD  DRUG ALLERGIES:   Allergies  Allergen Reactions  . Cheese     Patient doesn't want any cheese    DISCHARGE MEDICATIONS:   Allergies as of 05/06/2017      Reactions   Cheese    Patient doesn't want any cheese      Medication List    STOP taking these medications   apixaban 5 MG Tabs tablet Commonly known as:  ELIQUIS   diltiazem 120 MG tablet Commonly known as:  CARDIZEM     TAKE these medications   acetaminophen 325 MG tablet Commonly known as:  TYLENOL Take 650 mg by mouth every 6 (six) hours as needed for moderate pain.   albuterol (2.5 MG/3ML) 0.083% nebulizer solution Commonly known as:  PROVENTIL Take 2.5 mg by nebulization every 4 (four) hours as needed for wheezing or shortness of breath.   albuterol 108 (90 Base) MCG/ACT inhaler Commonly known as:  PROVENTIL HFA;VENTOLIN HFA Inhale 1-2 puffs into the lungs every 4 (four) hours as needed for wheezing or shortness  of breath.   amoxicillin-clavulanate 875-125 MG tablet Commonly known as:  AUGMENTIN Take 1 tablet by mouth 2 (two) times daily for 5 days.   aspirin EC 81 MG tablet Take 81 mg by mouth daily.   atorvastatin 80 MG tablet Commonly known as:  LIPITOR Take 80 mg by mouth at bedtime.   carbidopa-levodopa 25-250 MG tablet Commonly known as:  SINEMET IR Take 1 tablet by mouth 4 (four) times daily.   cetirizine 10 MG tablet Commonly known as:  ZYRTEC Take 10 mg by mouth at bedtime as needed for allergies.   citalopram 20 MG tablet Commonly known as:  CELEXA Take 20 mg by mouth daily.   COMBIGAN 0.2-0.5 % ophthalmic solution Generic drug:  brimonidine-timolol Place 1 drop into both eyes 2 (two) times daily.   cyanocobalamin 500 MCG tablet Take 1 tablet (500 mcg total) by mouth daily.   Difluprednate 0.05 % Emul Apply 1 drop to eye 2 (two) times daily.   diltiazem 120 MG 24 hr capsule Commonly known as:  CARDIZEM CD Take 1 capsule (120 mg total)  by mouth daily. What changed:  when to take this   ferrous sulfate 325 (65 FE) MG EC tablet Take 1 tablet (325 mg total) by mouth 2 (two) times daily.   fludrocortisone 0.1 MG tablet Commonly known as:  FLORINEF Take 0.1 mg by mouth daily.   fluticasone 50 MCG/ACT nasal spray Commonly known as:  FLONASE Place 2 sprays into both nostrils daily as needed for rhinitis.   gabapentin 600 MG tablet Commonly known as:  NEURONTIN Take 600 mg by mouth 3 (three) times daily.   GLUTOSE 15 PO Take 1 Tube by mouth. For blood sugar less than 70, repeat in 5 minutes up to 2 doses   HYDROcodone-acetaminophen 5-325 MG tablet Commonly known as:  NORCO/VICODIN Take 1 tablet by mouth daily as needed for moderate pain.   insulin glargine 100 UNIT/ML injection Commonly known as:  LANTUS Inject 20 Units into the skin daily.   levETIRAcetam 500 MG tablet Commonly known as:  KEPPRA Take 500 mg by mouth every 12 (twelve) hours.   lidocaine  5 % ointment Commonly known as:  XYLOCAINE Apply 1 application topically as needed.   magnesium hydroxide 400 MG/5ML suspension Commonly known as:  MILK OF MAGNESIA Take 15 mLs by mouth daily as needed for mild constipation.   metFORMIN 1000 MG tablet Commonly known as:  GLUCOPHAGE Take 500 mg by mouth daily with breakfast.   mirabegron ER 50 MG Tb24 tablet Commonly known as:  MYRBETRIQ Take 50 mg by mouth daily.   nitroGLYCERIN 0.4 MG SL tablet Commonly known as:  NITROSTAT Place 0.4 mg under the tongue every 5 (five) minutes as needed for chest pain.   oxyCODONE-acetaminophen 10-325 MG tablet Commonly known as:  PERCOCET Take 1 tablet by mouth every 4 (four) hours as needed.   pantoprazole 40 MG tablet Commonly known as:  PROTONIX Take 40 mg by mouth daily.   potassium chloride 20 MEQ packet Commonly known as:  KLOR-CON Take 1 packet by mouth daily.   predniSONE 10 MG tablet Commonly known as:  DELTASONE Label  & dispense according to the schedule below. 5 Pills PO for 1 day then, 4 Pills PO for 1 day, 3 Pills PO for 1 day, 2 Pills PO for 1 day, 1 Pill PO for 1 days then STOP. What changed:    medication strength  additional instructions   pregabalin 50 MG capsule Commonly known as:  LYRICA Take 50 mg by mouth 2 (two) times daily.   ranitidine 150 MG tablet Commonly known as:  ZANTAC Take 150 mg by mouth at bedtime.   REFRESH OPTIVE OP Apply 2 drops to eye every 8 (eight) hours as needed (dry eyes).   roflumilast 500 MCG Tabs tablet Commonly known as:  DALIRESP Take 1 tablet by mouth daily.   sennosides-docusate sodium 8.6-50 MG tablet Commonly known as:  SENOKOT-S Take 1 tablet by mouth daily.   tiotropium 18 MCG inhalation capsule Commonly known as:  SPIRIVA Place 18 mcg into inhaler and inhale daily. 2 PUFFS   traZODone 50 MG tablet Commonly known as:  DESYREL Take 50 mg by mouth at bedtime.   TUSSIN DM 10-100 MG/5ML liquid Generic drug:   Dextromethorphan-Guaifenesin Take 10 mLs by mouth every 6 (six) hours as needed (cough).   Vitamin D (Ergocalciferol) 50000 units Caps capsule Commonly known as:  DRISDOL Take 50,000 Units by mouth every 30 (thirty) days.         DISCHARGE INSTRUCTIONS:   DIET:  Cardiac diet  and Diabetic diet  DISCHARGE CONDITION:  Stable  ACTIVITY:  Activity as tolerated  OXYGEN:  Home Oxygen: No.   Oxygen Delivery: room air  DISCHARGE LOCATION:  Home with home health physical therapy and nursing.   If you experience worsening of your admission symptoms, develop shortness of breath, life threatening emergency, suicidal or homicidal thoughts you must seek medical attention immediately by calling 911 or calling your MD immediately  if symptoms less severe.  You Must read complete instructions/literature along with all the possible adverse reactions/side effects for all the Medicines you take and that have been prescribed to you. Take any new Medicines after you have completely understood and accpet all the possible adverse reactions/side effects.   Please note  You were cared for by a hospitalist during your hospital stay. If you have any questions about your discharge medications or the care you received while you were in the hospital after you are discharged, you can call the unit and asked to speak with the hospitalist on call if the hospitalist that took care of you is not available. Once you are discharged, your primary care physician will handle any further medical issues. Please note that NO REFILLS for any discharge medications will be authorized once you are discharged, as it is imperative that you return to your primary care physician (or establish a relationship with a primary care physician if you do not have one) for your aftercare needs so that they can reassess your need for medications and monitor your lab values.     Today   No acute bleeding overnight, hemoglobin stable.  Endoscopy interpreted and shows small bowel polyps and AVMs but they are not acutely bleeding. GI recommended giving him some IV iron and also oral B12 upon discharge. He will follow up with GI in the next few weeks.  VITAL SIGNS:  Blood pressure 125/69, pulse 88, temperature (!) 97.4 F (36.3 C), temperature source Oral, resp. rate 16, height '5\' 8"'  (1.727 m), weight 90.7 kg (199 lb 15.3 oz), SpO2 91 %.  I/O:    Intake/Output Summary (Last 24 hours) at 05/06/2017 1608 Last data filed at 05/06/2017 1413 Gross per 24 hour  Intake 840 ml  Output 400 ml  Net 440 ml    PHYSICAL EXAMINATION:   GENERAL:  76 y.o.-year-old patient lying in bed in no acute distress.  EYES: Pupils equal, round, reactive to light and accommodation. No scleral icterus. Extraocular muscles intact.  HEENT: Head atraumatic, normocephalic. Oropharynx and nasopharynx clear.  NECK:  Supple, no jugular venous distention. No thyroid enlargement, no tenderness.  LUNGS: Good a/e B/l, minimal wheezing, No rales, rhonchi. No use of accessory muscles of respiration.  CARDIOVASCULAR: S1, S2 normal. No murmurs, rubs, or gallops.  ABDOMEN: Soft, nontender, nondistended. Bowel sounds present. No organomegaly or mass.  EXTREMITIES: No cyanosis, clubbing or edema b/l.    NEUROLOGIC: Cranial nerves II through XII are intact. No focal Motor or sensory deficits b/l. Globally weak. PSYCHIATRIC: The patient is alert and oriented x 3.  SKIN: No obvious rash, lesion, or ulcer.    DATA REVIEW:   CBC Recent Labs  Lab 05/06/17 0417  WBC 7.8  HGB 8.8*  HCT 28.8*  PLT 178    Chemistries  Recent Labs  Lab 05/04/17 0502  05/06/17 0417  NA 140   < > 141  K 2.6*   < > 3.2*  CL 101   < > 100*  CO2 30   < > 34*  GLUCOSE 153*   < > 115*  BUN 13   < > 8  CREATININE 0.90   < > 0.90  CALCIUM 7.7*   < > 8.0*  MG 1.5*   < > 1.7  AST 28  --   --   ALT 6*  --   --   ALKPHOS 64  --   --   BILITOT 0.8  --   --    < > = values in  this interval not displayed.    Cardiac Enzymes Recent Labs  Lab 04/30/17 0417  TROPONINI <0.03    Microbiology Results  Results for orders placed or performed during the hospital encounter of 04/30/17  MRSA PCR Screening     Status: None   Collection Time: 05/01/17 10:28 AM  Result Value Ref Range Status   MRSA by PCR NEGATIVE NEGATIVE Final    Comment:        The GeneXpert MRSA Assay (FDA approved for NASAL specimens only), is one component of a comprehensive MRSA colonization surveillance program. It is not intended to diagnose MRSA infection nor to guide or monitor treatment for MRSA infections.   Culture, blood (x 2)     Status: None   Collection Time: 05/01/17 10:31 AM  Result Value Ref Range Status   Specimen Description BLOOD RIGHT HAND  Final   Special Requests   Final    BOTTLES DRAWN AEROBIC AND ANAEROBIC Blood Culture adequate volume   Culture NO GROWTH 5 DAYS  Final   Report Status 05/06/2017 FINAL  Final  Culture, blood (x 2)     Status: None   Collection Time: 05/01/17 10:33 AM  Result Value Ref Range Status   Specimen Description BLOOD LEFT HAND  Final   Special Requests   Final    BOTTLES DRAWN AEROBIC AND ANAEROBIC Blood Culture adequate volume   Culture NO GROWTH 5 DAYS  Final   Report Status 05/06/2017 FINAL  Final    RADIOLOGY:  No results found.    Management plans discussed with the patient, family and they are in agreement.  CODE STATUS:     Code Status Orders  (From admission, onward)        Start     Ordered   04/30/17 0835  Full code  Continuous     04/30/17 0834  Advance Directive Documentation     Most Recent Value  Type of Advance Directive  Living will  Pre-existing out of facility DNR order (yellow form or pink MOST form)  No data  "MOST" Form in Place?  No data      TOTAL TIME TAKING CARE OF THIS PATIENT: 40 minutes.    Henreitta Leber M.D on 05/06/2017 at 4:08 PM  Between 7am to 6pm - Pager -  [phone]  After 6pm go to [email or web address] - Technical brewer [facility]  Office  [phone]  CC: Primary care physician; [facility] [place]

## 2017-05-27 ENCOUNTER — Ambulatory Visit: Payer: Medicare (Managed Care) | Admitting: Gastroenterology

## 2017-06-01 ENCOUNTER — Encounter: Payer: Self-pay | Admitting: Gastroenterology

## 2017-06-01 ENCOUNTER — Ambulatory Visit: Payer: Medicare (Managed Care) | Admitting: Gastroenterology

## 2017-06-01 NOTE — Progress Notes (Deleted)
Summary of history :  Jimmy Hill is a 77 y.o. male has been previously being seen at [facility] for anemia.  The last office visit with [facility] was in 02/06/2016 and at that time he had never had a colonoscopy.  An attempt was made at a prior inpatient hospitalization and he was not cleaned out.  Review of epic suggest that he had a colonoscopy on 04/17/2017 prep of the colon was felt to polyps 5-7 mm in the descending colon and transverse colon were dissected with a hot snare and he tried a large lipoma seen in the transverse colon.  He also underwent an upper endoscopy.  The esophagus was normal and the gastric mucosa appeared atrophied and was biopsied and resulted as a reactive gastropathy with no H. pylori.  The polyps of the transverse colon were adenomatous.  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   Recent admission he was admitted on 04/30/2017 after fainting.  He briefly lost consciousness.  CT scan of the abdomen showed no acute process.  Laboratory evaluation revealed anemia and guaiac positive stool and is admitted.  He has coronary artery disease with atrial fibrillation, PCI and stent placements in the right coronary artery in 2017.  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 y/o male admitted with a syncopal episode and incidentally found to be severely anemic with significant microcytosis and iron deficiency .   He has had recent evaluation at [facility] in  April 17, 2017 with an upper endoscopy and colonoscopy which showed no significant abnormality to account for this degree of anemia.  He had not had any small bowel evaluation att hat time nor any iron studies at [facility]  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  I cannot find in the records any pathology report suggesting a biopsy from the liver. We saw him in 04/2017 for the anemia and capsule study of the small bowel shows polys x2 and AVM's x2 .   Plan  1. Refer to [facility] for enteroscopy to evaluate polyps and AVM's for ablation  2. Check iron studies and CBC- if low will refer for IV iron  3. Will refer to Oncology to discuss at Multidisciplinary meeting next steps in terms of the lesion in the liver/biopsy and subsequent care.

## 2017-08-04 ENCOUNTER — Emergency Department: Payer: Medicare (Managed Care)

## 2017-08-04 ENCOUNTER — Emergency Department
Admission: EM | Admit: 2017-08-04 | Discharge: 2017-08-04 | Disposition: A | Discharge status: Home / Self Care | Payer: Medicare (Managed Care) | Attending: Student in an Organized Health Care Education/Training Program | Admitting: Student in an Organized Health Care Education/Training Program

## 2017-08-04 DIAGNOSIS — I509 Heart failure, unspecified: Secondary | ICD-10-CM | POA: Insufficient documentation

## 2017-08-04 DIAGNOSIS — Z7901 Long term (current) use of anticoagulants: Secondary | ICD-10-CM | POA: Insufficient documentation

## 2017-08-04 DIAGNOSIS — E7800 Pure hypercholesterolemia, unspecified: Secondary | ICD-10-CM | POA: Diagnosis not present

## 2017-08-04 DIAGNOSIS — Z7982 Long term (current) use of aspirin: Secondary | ICD-10-CM | POA: Diagnosis not present

## 2017-08-04 DIAGNOSIS — E1122 Type 2 diabetes mellitus with diabetic chronic kidney disease: Secondary | ICD-10-CM | POA: Diagnosis not present

## 2017-08-04 DIAGNOSIS — E860 Dehydration: Secondary | ICD-10-CM | POA: Insufficient documentation

## 2017-08-04 DIAGNOSIS — I130 Hypertensive heart and chronic kidney disease with heart failure and stage 1 through stage 4 chronic kidney disease, or unspecified chronic kidney disease: Secondary | ICD-10-CM | POA: Insufficient documentation

## 2017-08-04 DIAGNOSIS — Z7984 Long term (current) use of oral hypoglycemic drugs: Secondary | ICD-10-CM | POA: Diagnosis not present

## 2017-08-04 DIAGNOSIS — I252 Old myocardial infarction: Secondary | ICD-10-CM | POA: Insufficient documentation

## 2017-08-04 DIAGNOSIS — R000 Tachycardia, unspecified: Secondary | ICD-10-CM

## 2017-08-04 DIAGNOSIS — Z87891 Personal history of nicotine dependence: Secondary | ICD-10-CM | POA: Diagnosis not present

## 2017-08-04 DIAGNOSIS — I2510 Atherosclerotic heart disease of native coronary artery without angina pectoris: Secondary | ICD-10-CM | POA: Insufficient documentation

## 2017-08-04 DIAGNOSIS — N183 Chronic kidney disease, stage 3 (moderate): Secondary | ICD-10-CM | POA: Diagnosis not present

## 2017-08-04 DIAGNOSIS — Z79899 Other long term (current) drug therapy: Secondary | ICD-10-CM | POA: Diagnosis not present

## 2017-08-04 DIAGNOSIS — J449 Chronic obstructive pulmonary disease, unspecified: Secondary | ICD-10-CM | POA: Diagnosis not present

## 2017-08-04 DIAGNOSIS — R079 Chest pain, unspecified: Secondary | ICD-10-CM | POA: Diagnosis present

## 2017-08-04 LAB — BASIC METABOLIC PANEL
ANION GAP: 8 (ref 5–15)
BUN: 16 mg/dL (ref 6–20)
CHLORIDE: 102 mmol/L (ref 101–111)
CO2: 29 mmol/L (ref 22–32)
CREATININE: 1.12 mg/dL (ref 0.61–1.24)
Calcium: 8.1 mg/dL — ABNORMAL LOW (ref 8.9–10.3)
GFR calc non Af Amer: >60 mL/min (ref >60)
Glucose, Bld: 167 mg/dL — ABNORMAL HIGH (ref 65–99)
Potassium: 4 mmol/L (ref 3.5–5.1)
Sodium: 139 mmol/L (ref 135–145)

## 2017-08-04 LAB — CBC
HCT: 33.6 % — ABNORMAL LOW (ref 40.0–52.0)
HEMOGLOBIN: 10.6 g/dL — AB (ref 13.0–18.0)
MCH: 22.9 pg — AB (ref 26.0–34.0)
MCHC: 31.7 g/dL — ABNORMAL LOW (ref 32.0–36.0)
MCV: 72.3 fL — AB (ref 80.0–100.0)
Platelets: 189 10*3/uL (ref 150–440)
RBC: 4.64 MIL/uL (ref 4.40–5.90)
RDW: 18.6 % — ABNORMAL HIGH (ref 11.5–14.5)
WBC: 7.7 10*3/uL (ref 3.8–10.6)

## 2017-08-04 LAB — TROPONIN I
Troponin I: <0.03 ng/mL (ref <0.03)
Troponin I: <0.03 ng/mL (ref <0.03)

## 2017-08-04 LAB — BRAIN NATRIURETIC PEPTIDE: B Natriuretic Peptide: 208 pg/mL — ABNORMAL HIGH (ref 0.0–100.0)

## 2017-08-04 MED ORDER — SODIUM CHLORIDE 0.9 % IV BOLUS (SEPSIS)
1000.0000 mL | Freq: Once | INTRAVENOUS | Status: AC
Start: 2017-08-04 — End: 2017-08-04
  Administered 2017-08-04: 1000 mL via INTRAVENOUS

## 2017-08-04 NOTE — ED Provider Notes (Signed)
[facility] Emergency Department Provider Note    First MD Initiated Contact with Patient 08/04/17 1301     (approximate)  I have reviewed the triage vital signs and the nursing notes.   HISTORY  Chief Complaint Chest pain   HPI Jimmy Hill is a 77 y.o. male with extensive past medical history [facility] with chief complaint of chest pain since this morning he was tachycardia.  Denies any fevers.  No nausea or vomiting.  States he did feel like he was about to pass out.  Patient had EKG done clinic and was reportedly at some point in SVT though no tracing provided by EMS shows this.   patient found to be in sinus tachycardia.  Denies any melena or hematochezia.  No back pain.  States he has had mid sternal mild discomfort radiating to his left shoulder.  Past Medical History:  Diagnosis Date  . Allergic rhinitis   . Anginal pain ([place])   . Arthritis   . Autonomic dysreflexia   . Back pain    CHRONIC LBP,STENOSIS  . CHF (congestive heart failure) ([place])   . Chronic kidney disease    STAGE 3  . COPD (chronic obstructive pulmonary disease) ([place])   . Coronary artery disease   . Depression   . Diabetes ([place])   . Diabetes mellitus without complication ([place])   . DJD (degenerative joint disease)   . Dysphagia   . Dysrhythmia    A-fib, now converted  . Edema    FEET/ANKLES  . GERD (gastroesophageal reflux disease)   . Glaucoma   . H/O orthostatic hypotension   . Hypercholesterolemia   . Myocardial infarction ([place]) 2000  . Parkinson's disease ([place])   . Peripheral vascular disease ([place])   . Pneumonia    history of   . Polyneuropathy   . RLS (restless legs syndrome)   . Seizures ([place])    when in rehab for his hip  . Sleep apnea   . Smoker   . Spinal stenosis   . Strabismic amblyopia   . Tinnitus    Family History  Problem Relation Age of Onset  . Heart disease Father   . Diabetes Mellitus II Other    Past  Surgical History:  Procedure Laterality Date  . ANKLE SURGERY    . APPENDECTOMY    . BACK SURGERY    . CATARACT EXTRACTION W/PHACO Left 08/26/2016   Procedure: CATARACT EXTRACTION PHACO AND INTRAOCULAR LENS PLACEMENT (IOC);  Surgeon: Birder Robson, MD;  Location: [facility] ORS;  Service: Ophthalmology;  Laterality: Left;  [place] 01:37 AP% 19.4 CDE 18.97 fluid pack lot # [id number]  . CORONARY ANGIOPLASTY     STENT  . CTR    . ESOPHAGOGASTRODUODENOSCOPY (EGD) WITH PROPOFOL N/A 03/04/2016   Procedure: ESOPHAGOGASTRODUODENOSCOPY (EGD) WITH PROPOFOL;  Surgeon: [name], MD;  Location: [facility] ENDOSCOPY;  Service: Endoscopy;  Laterality: N/A;  . EYE SURGERY     cataract  . FRACTURE SURGERY    . HIP PINNING,CANNULATED Left 12/04/2015   Procedure: CANNULATED HIP PINNING;  Surgeon: Thornton Park, MD;  Location: [facility] ORS;  Service: Orthopedics;  Laterality: Left;   Patient Active Problem List   Diagnosis Date Noted  . Sepsis ([place]) 05/01/2017  . Syncope 04/30/2017  . New onset atrial fibrillation ([place]) 07/03/2016  . Loss of weight   . Gastritis without bleeding   . Occult GI bleeding   . GI bleed 03/02/2016  . Left-sided weakness 03/02/2016  .  Rectal bleeding 01/16/2016  . Colitis presumed infectious 01/10/2016  . Pressure ulcer 12/29/2015  . Altered mental status 12/28/2015  . Fracture of femoral neck, left ([place]) 12/03/2015  . CAD (coronary artery disease) 12/03/2015  . Sleep apnea 12/03/2015  . Femoral neck fracture, left, closed, initial encounter 12/03/2015  . Unstable angina ([place]) 05/14/2015  . COPD (chronic obstructive pulmonary disease) ([place]) 02/05/2015  . Acute bronchitis 02/05/2015  . Type 2 diabetes mellitus ([place]) 02/05/2015  . Chest pain 12/09/2014  . Presence of drug coated stent in LAD coronary artery 07/11/2013  . Disease characterized by destruction of skeletal muscle 09/12/2009  . Encephalopathy 09/12/2009  . Vitamin D deficiency 05/05/2008  . Polyneuropathy in diabetes  ([place]) 08/17/2005  . Spinal stenosis in cervical region 03/20/2005  . Essential tremor 01/17/2005  . Hypercholesterolemia 03/21/2004  . Hypertension, benign 01/08/2004  . 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 Localized osteoarthrosis, lower leg 04/07/2002  . Parkinson's disease ([place]) 09/21/2000  . Peptic ulcer with hemorrhage and perforation ([place]) 04/19/1996  . 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 Authorizing Provider  acetaminophen (TYLENOL) 325 MG tablet Take 650 mg by mouth every 6 (six) hours as needed for moderate pain.    [provider]  albuterol (PROVENTIL HFA;VENTOLIN HFA) 108 (90 Base) MCG/ACT inhaler Inhale 1-2 puffs into the lungs every 4 (four) hours as needed for wheezing or shortness of breath.    [provider]  apixaban (ELIQUIS) 5 MG TABS tablet Take by mouth.    [provider]  aspirin EC 81 MG tablet Take 81 mg by mouth daily.    [provider]  atorvastatin (LIPITOR) 80 MG tablet Take 80 mg by mouth at bedtime.    [provider]  brimonidine-timolol (COMBIGAN) 0.2-0.5 % ophthalmic solution Place 1 drop into both eyes 2 (two) times daily.    [provider]  carbidopa-levodopa (SINEMET IR) 25-250 MG tablet Take 1 tablet by mouth 4 (four) times daily.     [provider]  Carboxymethylcellul-Glycerin (REFRESH OPTIVE OP) Apply 2 drops to eye every 8 (eight) hours as needed (dry eyes).    [provider]  cetirizine (ZYRTEC) 10 MG tablet Take 10 mg by mouth at bedtime as needed for allergies.     [provider]  citalopram (CELEXA) 20 MG tablet Take 20 mg by mouth daily.    [provider]  Dextromethorphan-Guaifenesin (TUSSIN DM) 10-100 MG/5ML liquid Take 10 mLs by mouth every 6 (six) hours as needed (cough).    [provider]  Dextrose, Diabetic Use, (GLUTOSE 15 PO) Take 1 Tube  by mouth. For blood sugar less than 70, repeat in 5 minutes up to 2 doses    [provider]  diclofenac sodium (VOLTAREN) 1 % GEL Apply topically. 01/26/17 01/26/18  [provider]  Difluprednate 0.05 % EMUL Apply 1 drop to eye 2 (two) times daily.    [provider]  diltiazem (CARDIZEM CD) 120 MG 24 hr capsule Take 1 capsule (120 mg total) by mouth daily. Patient taking differently: Take 120 mg by mouth 4 (four) times daily.  07/05/16   Bettey Costa, MD  ferrous sulfate 325 (65 FE) MG EC tablet Take 1 tablet (325 mg total) by mouth 2 (two) times daily. 05/06/17 07/05/17  Henreitta Leber, MD  fludrocortisone (FLORINEF) 0.1 MG tablet Take 0.1 mg by mouth daily.  [provider]  fluticasone (FLONASE) 50 MCG/ACT nasal spray Place 2 sprays into both nostrils daily as needed for rhinitis.    [provider]  Fluticasone-Salmeterol (ADVAIR DISKUS) 500-50 MCG/DOSE AEPB Inhale into the lungs.    [provider]  gabapentin (NEURONTIN) 600 MG tablet Take 600 mg by mouth 3 (three) times daily.     [provider]  HYDROcodone-acetaminophen (NORCO/VICODIN) 5-325 MG tablet Take 1 tablet by mouth daily as needed for moderate pain.     [provider]  insulin glargine (LANTUS) 100 UNIT/ML injection Inject 20 Units into the skin daily.    [provider]  levETIRAcetam (KEPPRA) 500 MG tablet Take 500 mg by mouth every 12 (twelve) hours.     [provider]  lidocaine (XYLOCAINE) 5 % ointment Apply 1 application topically as needed.    [provider]  loperamide (IMODIUM A-D) 2 MG tablet Take by mouth.    [provider]  magnesium hydroxide (MILK OF MAGNESIA) 400 MG/5ML suspension Take 15 mLs by mouth daily as needed for mild constipation.    [provider]  metFORMIN (GLUCOPHAGE) 1000 MG tablet Take 500 mg by mouth daily with breakfast.     [provider]  mirabegron ER (MYRBETRIQ)  50 MG TB24 tablet Take 50 mg by mouth daily.    [provider]  nitroGLYCERIN (NITROSTAT) 0.4 MG SL tablet Place 0.4 mg under the tongue every 5 (five) minutes as needed for chest pain.    [provider]  ondansetron (ZOFRAN-ODT) 4 MG disintegrating tablet Take 4 mg by mouth.    [provider]  oxyCODONE-acetaminophen (PERCOCET) 10-325 MG tablet Take 1 tablet by mouth every 4 (four) hours as needed.    [provider]  pantoprazole (PROTONIX) 40 MG tablet Take 40 mg by mouth daily.    [provider]  potassium chloride (KLOR-CON) 20 MEQ packet Take 1 packet by mouth daily.    [provider]  predniSONE (DELTASONE) 10 MG tablet Label  & dispense according to the schedule below. 5 Pills PO for 1 day then, 4 Pills PO for 1 day, 3 Pills PO for 1 day, 2 Pills PO for 1 day, 1 Pill PO for 1 days then STOP. 05/06/17   Henreitta Leber, MD  pregabalin (LYRICA) 50 MG capsule Take 50 mg by mouth 2 (two) times daily. 01/26/17 01/26/18  [provider]  QUEtiapine (SEROQUEL) 25 MG tablet Take 25 mg by mouth. 01/16/16   [provider]  ranitidine (ZANTAC) 150 MG tablet Take 150 mg by mouth at bedtime.    [provider]  roflumilast (DALIRESP) 500 MCG TABS tablet Take 1 tablet by mouth daily.    [provider]  sennosides-docusate sodium (SENOKOT-S) 8.6-50 MG tablet Take 1 tablet by mouth daily.     [provider]  tiotropium (SPIRIVA) 18 MCG inhalation capsule Place 18 mcg into inhaler and inhale daily. 2 PUFFS    [provider]  traZODone (DESYREL) 50 MG tablet Take 50 mg by mouth at bedtime.    [provider]  vitamin B-12 500 MCG tablet Take 1 tablet (500 mcg total) by mouth daily. 05/06/17   Henreitta Leber, MD  Vitamin D, Ergocalciferol, (DRISDOL) 50000 units CAPS capsule Take 50,000 Units by mouth every 30 (thirty) days.    [provider]    Allergies Bupropion; Cheese;  and Sulfamethoxazole-trimethoprim    Social History Social History   Tobacco Use  .  Smoking status: Former Smoker    Packs/day: 0.50    Types: Cigarettes    Last attempt to quit: 11/17/2015    Years since quitting: 1.7  . Smokeless tobacco: Never Used  Substance Use Topics  . Alcohol use: No  . Drug use: No    Review of Systems Patient denies headaches, rhinorrhea, blurry vision, numbness, shortness of breath, chest pain, edema, cough, abdominal pain, nausea, vomiting, diarrhea, dysuria, fevers, rashes or hallucinations unless otherwise stated above in HPI. ____________________________________________   PHYSICAL EXAM:  VITAL SIGNS: Vitals:   08/04/17 1345 08/04/17 1400  BP:  127/76  Pulse:    Resp: 17 16  Temp:    SpO2:      Constitutional: Alert and oriented. Pale and ill appearing and but no acute distress. Eyes: Conjunctivae are normal.  Head: Atraumatic. Nose: No congestion/rhinnorhea. Mouth/Throat: Mucous membranes are moist.   Neck: No stridor. Painless ROM.  Cardiovascular: tachycardic but regular rhythm. Grossly normal heart sounds.  Good peripheral circulation. Respiratory: Normal respiratory effort.  No retractions. Lungs CTAB. Gastrointestinal: Soft and nontender. No distention. No abdominal bruits. No CVA tenderness. Genitourinary:  Musculoskeletal: No lower extremity tenderness nor edema.  No joint effusions. Neurologic:  Normal speech and language. No gross focal neurologic deficits are appreciated. No facial droop Skin:  Skin is warm, dry and intact. No rash noted. Psychiatric: Mood and affect are normal. Speech and behavior are normal.  ____________________________________________   LABS (all labs ordered are listed, but only abnormal results are displayed)  Results for orders placed or performed during the hospital encounter of 08/04/17 (from the past 24 hour(s))  Basic metabolic panel     Status: Abnormal   Collection Time: 08/04/17  1:02 PM    Result Value Ref Range   Sodium 139 135 - 145 mmol/L   Potassium 4.0 3.5 - 5.1 mmol/L   Chloride 102 101 - 111 mmol/L   CO2 29 22 - 32 mmol/L   Glucose, Bld 167 (H) 65 - 99 mg/dL   BUN 16 6 - 20 mg/dL   Creatinine, Ser 1.12 0.61 - 1.24 mg/dL   Calcium 8.1 (L) 8.9 - 10.3 mg/dL   GFR calc non Af Amer >60 >60 mL/min   GFR calc Af Amer >60 >60 mL/min   Anion gap 8 5 - 15  CBC     Status: Abnormal   Collection Time: 08/04/17  1:02 PM  Result Value Ref Range   WBC 7.7 3.8 - 10.6 K/uL   RBC 4.64 4.40 - 5.90 MIL/uL   Hemoglobin 10.6 (L) 13.0 - 18.0 g/dL   HCT 33.6 (L) 40.0 - 52.0 %   MCV 72.3 (L) 80.0 - 100.0 fL   MCH 22.9 (L) 26.0 - 34.0 pg   MCHC 31.7 (L) 32.0 - 36.0 g/dL   RDW 18.6 (H) 11.5 - 14.5 %   Platelets 189 150 - 440 K/uL  Troponin I     Status: None   Collection Time: 08/04/17  1:02 PM  Result Value Ref Range   Troponin I <0.03 <0.03 ng/mL  Brain natriuretic peptide     Status: Abnormal   Collection Time: 08/04/17  1:02 PM  Result Value Ref Range   B Natriuretic Peptide 208.0 (H) 0.0 - 100.0 pg/mL  Troponin I     Status: None   Collection Time: 08/04/17  3:20 PM  Result Value Ref Range   Troponin I <0.03 <0.03 ng/mL   ____________________________________________  EKG My review and personal interpretation at  Time: 13:01   Indication: chest pain  Rate: 115  Rhythm: sinus Axis: normal Other: poor r wave progression, no stemi, normal intervals ____________________________________________  RADIOLOGY  I personally reviewed all radiographic images ordered to evaluate for the above acute complaints and reviewed radiology reports and findings.  These findings were personally discussed with the patient.  Please see medical record for radiology report.  ____________________________________________   PROCEDURES  Procedure(s) performed:  Procedures    Critical Care performed: no ____________________________________________   INITIAL IMPRESSION / ASSESSMENT AND  PLAN / ED COURSE  Pertinent labs & imaging results that were available during my care of the patient were reviewed by me and considered in my medical decision making (see chart for details).  DDX: ACS, pericarditis, sepsis, dehydration, pe, dissection, pna, bronchitis, costochondritis   YUAN GANN is a 77 y.o. who presents to the ED with pain and sinus tachycardia as described above.  Patient currently on Eliquis.  Not clinically consistent with PE.  EKG shows sinus tach and no evidence of SVT.  Will provide IV fluids he does appear clinically dehydrated with slightly low blood pressure.  We will send blood work radiographs continue to monitor patient for the above differential.  Clinical Course as of Aug 04 1953  Tue Aug 04, 2017  1417 Patient's heart rate improved and blood pressure improved after IV fluids.  Appears well-appearing in no acute distress.  Initial troponin is negative.  Will repeat enzymes given his high risk profile.  [PR]  1435 Patient reassessed.  States he is chest pain-free currently.  Requesting something to drink.  Informed patient I would recommend that he stay for repeat troponin patient agrees.  [PR]    Clinical Course User Index [PR] Merlyn Lot, MD   Repeat troponin negative.  Patient stable and appropriate for outpatient follow-up.  As part of my medical decision making, I reviewed the following data within the [place] notes reviewed and incorporated, Labs reviewed, notes from prior ED visits and [place] Controlled Substance Database   ____________________________________________   FINAL CLINICAL IMPRESSION(S) / ED DIAGNOSES  Final diagnoses:  Sinus tachycardia  Dehydration      NEW MEDICATIONS STARTED DURING THIS VISIT:  Discharge Medication List as of 08/04/2017  4:37 PM       Note:  This document was prepared using Dragon voice recognition software and may include unintentional dictation errors.      Merlyn Lot, MD 08/04/17 Karl Bales

## 2017-08-04 NOTE — ED Triage Notes (Signed)
Pt presents [facility] for what was in coded as ST.Pt  Is A/Ox4. NAD.

## 2017-09-22 ENCOUNTER — Observation Stay
Admission: EM | Admit: 2017-09-22 | Discharge: 2017-09-23 | Disposition: A | Discharge status: Home / Self Care | Payer: No Typology Code available for payment source | Attending: Internal Medicine | Admitting: Internal Medicine

## 2017-09-22 ENCOUNTER — Encounter: Payer: Self-pay | Admitting: Emergency Medicine

## 2017-09-22 ENCOUNTER — Emergency Department: Payer: No Typology Code available for payment source

## 2017-09-22 DIAGNOSIS — I130 Hypertensive heart and chronic kidney disease with heart failure and stage 1 through stage 4 chronic kidney disease, or unspecified chronic kidney disease: Secondary | ICD-10-CM | POA: Diagnosis not present

## 2017-09-22 DIAGNOSIS — E1142 Type 2 diabetes mellitus with diabetic polyneuropathy: Secondary | ICD-10-CM | POA: Diagnosis not present

## 2017-09-22 DIAGNOSIS — Z7982 Long term (current) use of aspirin: Secondary | ICD-10-CM | POA: Diagnosis not present

## 2017-09-22 DIAGNOSIS — J9601 Acute respiratory failure with hypoxia: Secondary | ICD-10-CM | POA: Diagnosis not present

## 2017-09-22 DIAGNOSIS — G4733 Obstructive sleep apnea (adult) (pediatric): Secondary | ICD-10-CM | POA: Insufficient documentation

## 2017-09-22 DIAGNOSIS — J441 Chronic obstructive pulmonary disease with (acute) exacerbation: Secondary | ICD-10-CM | POA: Diagnosis not present

## 2017-09-22 DIAGNOSIS — G20 Parkinson's disease: Secondary | ICD-10-CM | POA: Diagnosis not present

## 2017-09-22 DIAGNOSIS — S060X0A Concussion without loss of consciousness, initial encounter: Secondary | ICD-10-CM

## 2017-09-22 DIAGNOSIS — G9341 Metabolic encephalopathy: Secondary | ICD-10-CM | POA: Insufficient documentation

## 2017-09-22 DIAGNOSIS — N189 Chronic kidney disease, unspecified: Secondary | ICD-10-CM | POA: Insufficient documentation

## 2017-09-22 DIAGNOSIS — I252 Old myocardial infarction: Secondary | ICD-10-CM | POA: Diagnosis not present

## 2017-09-22 DIAGNOSIS — Z87891 Personal history of nicotine dependence: Secondary | ICD-10-CM | POA: Diagnosis not present

## 2017-09-22 DIAGNOSIS — I480 Paroxysmal atrial fibrillation: Secondary | ICD-10-CM | POA: Diagnosis not present

## 2017-09-22 DIAGNOSIS — F329 Major depressive disorder, single episode, unspecified: Secondary | ICD-10-CM | POA: Insufficient documentation

## 2017-09-22 DIAGNOSIS — Z79899 Other long term (current) drug therapy: Secondary | ICD-10-CM | POA: Insufficient documentation

## 2017-09-22 DIAGNOSIS — Z7951 Long term (current) use of inhaled steroids: Secondary | ICD-10-CM | POA: Diagnosis not present

## 2017-09-22 DIAGNOSIS — I2510 Atherosclerotic heart disease of native coronary artery without angina pectoris: Secondary | ICD-10-CM | POA: Insufficient documentation

## 2017-09-22 DIAGNOSIS — I509 Heart failure, unspecified: Secondary | ICD-10-CM | POA: Insufficient documentation

## 2017-09-22 DIAGNOSIS — E876 Hypokalemia: Secondary | ICD-10-CM | POA: Diagnosis not present

## 2017-09-22 DIAGNOSIS — Z794 Long term (current) use of insulin: Secondary | ICD-10-CM | POA: Insufficient documentation

## 2017-09-22 DIAGNOSIS — E1151 Type 2 diabetes mellitus with diabetic peripheral angiopathy without gangrene: Secondary | ICD-10-CM | POA: Insufficient documentation

## 2017-09-22 DIAGNOSIS — Z7952 Long term (current) use of systemic steroids: Secondary | ICD-10-CM | POA: Insufficient documentation

## 2017-09-22 DIAGNOSIS — E7800 Pure hypercholesterolemia, unspecified: Secondary | ICD-10-CM | POA: Insufficient documentation

## 2017-09-22 DIAGNOSIS — K219 Gastro-esophageal reflux disease without esophagitis: Secondary | ICD-10-CM | POA: Insufficient documentation

## 2017-09-22 DIAGNOSIS — R569 Unspecified convulsions: Secondary | ICD-10-CM | POA: Insufficient documentation

## 2017-09-22 DIAGNOSIS — Z955 Presence of coronary angioplasty implant and graft: Secondary | ICD-10-CM | POA: Diagnosis not present

## 2017-09-22 DIAGNOSIS — E1122 Type 2 diabetes mellitus with diabetic chronic kidney disease: Secondary | ICD-10-CM | POA: Diagnosis not present

## 2017-09-22 DIAGNOSIS — R51 Headache: Secondary | ICD-10-CM | POA: Diagnosis present

## 2017-09-22 DIAGNOSIS — W06XXXA Fall from bed, initial encounter: Secondary | ICD-10-CM | POA: Insufficient documentation

## 2017-09-22 DIAGNOSIS — J9602 Acute respiratory failure with hypercapnia: Secondary | ICD-10-CM | POA: Insufficient documentation

## 2017-09-22 LAB — BASIC METABOLIC PANEL
ANION GAP: 5 (ref 5–15)
BUN: 11 mg/dL (ref 6–20)
CALCIUM: 8.3 mg/dL — AB (ref 8.9–10.3)
CHLORIDE: 101 mmol/L (ref 101–111)
CO2: 33 mmol/L — ABNORMAL HIGH (ref 22–32)
CREATININE: 0.98 mg/dL (ref 0.61–1.24)
GFR calc Af Amer: >60 mL/min (ref >60)
GFR calc non Af Amer: >60 mL/min (ref >60)
Glucose, Bld: 170 mg/dL — ABNORMAL HIGH (ref 65–99)
Potassium: 3.2 mmol/L — ABNORMAL LOW (ref 3.5–5.1)
SODIUM: 139 mmol/L (ref 135–145)

## 2017-09-22 LAB — CBC WITH DIFFERENTIAL/PLATELET
Basophils Absolute: 0.1 10*3/uL (ref 0–0.1)
Basophils Relative: 1 %
EOS ABS: 0.3 10*3/uL (ref 0–0.7)
EOS PCT: 4 %
HCT: 34.9 % — ABNORMAL LOW (ref 40.0–52.0)
Hemoglobin: 10.8 g/dL — ABNORMAL LOW (ref 13.0–18.0)
LYMPHS PCT: 29 %
Lymphs Abs: 2.1 10*3/uL (ref 1.0–3.6)
MCH: 22.4 pg — ABNORMAL LOW (ref 26.0–34.0)
MCHC: 30.9 g/dL — ABNORMAL LOW (ref 32.0–36.0)
MCV: 72.6 fL — ABNORMAL LOW (ref 80.0–100.0)
Monocytes Absolute: 0.7 10*3/uL (ref 0.2–1.0)
Monocytes Relative: 10 %
NEUTROS PCT: 56 %
Neutro Abs: 4.2 10*3/uL (ref 1.4–6.5)
PLATELETS: 167 10*3/uL (ref 150–440)
RBC: 4.8 MIL/uL (ref 4.40–5.90)
RDW: 19 % — ABNORMAL HIGH (ref 11.5–14.5)
WBC: 7.4 10*3/uL (ref 3.8–10.6)

## 2017-09-22 LAB — TROPONIN I

## 2017-09-22 MED ORDER — ALBUTEROL SULFATE (2.5 MG/3ML) 0.083% IN NEBU
2.5000 mg | INHALATION_SOLUTION | Freq: Once | RESPIRATORY_TRACT | Status: AC
  Administered 2017-09-22: 2.5 mg via RESPIRATORY_TRACT
  Filled 2017-09-22: qty 3

## 2017-09-22 MED ORDER — METHYLPREDNISOLONE SODIUM SUCC 125 MG IJ SOLR
125.0000 mg | Freq: Once | INTRAMUSCULAR | Status: AC
  Administered 2017-09-22: 125 mg via INTRAVENOUS
  Filled 2017-09-22: qty 2

## 2017-09-22 MED ORDER — IPRATROPIUM-ALBUTEROL 0.5-2.5 (3) MG/3ML IN SOLN
3.0000 mL | Freq: Once | RESPIRATORY_TRACT | Status: AC
Start: 2017-09-22 — End: 2017-09-22
  Administered 2017-09-22: 3 mL via RESPIRATORY_TRACT
  Filled 2017-09-22: qty 3

## 2017-09-22 MED ORDER — METOCLOPRAMIDE HCL 5 MG/ML IJ SOLN
10.0000 mg | Freq: Once | INTRAMUSCULAR | Status: AC
  Administered 2017-09-22: 10 mg via INTRAMUSCULAR
  Filled 2017-09-22: qty 2

## 2017-09-22 NOTE — ED Notes (Signed)
Patient transported to CT 

## 2017-09-22 NOTE — ED Provider Notes (Signed)
[facility] Emergency Department Provider Note ____________________________________________   First MD Initiated Contact with Patient 09/22/17 1739     (approximate)  I have reviewed the triage vital signs and the nursing notes.   HISTORY  Chief Complaint Fall    HPI Jimmy Hill is a 77 y.o. male with PMH as noted below who presents with headache, gradual onset over the last 12 hours, worsening, and worst in the right temple.  Patient also reports some blurred vision on the right side which is new for him.  Patient states that he fell out of bed at around 10 PM last night and had a mild headache initially but it improved before returning this morning.  He denies nausea or vomiting, chest or back pain, or other injuries, although he does report some neck pain.  Past Medical History:  Diagnosis Date  . Allergic rhinitis   . Anginal pain ([place])   . Arthritis   . Autonomic dysreflexia   . Back pain    CHRONIC LBP,STENOSIS  . CHF (congestive heart failure) ([place])   . Chronic kidney disease    STAGE 3  . COPD (chronic obstructive pulmonary disease) ([place])   . Coronary artery disease   . Depression   . Diabetes ([place])   . Diabetes mellitus without complication ([place])   . DJD (degenerative joint disease)   . Dysphagia   . Dysrhythmia    A-fib, now converted  . Edema    FEET/ANKLES  . GERD (gastroesophageal reflux disease)   . Glaucoma   . H/O orthostatic hypotension   . Hypercholesterolemia   . Myocardial infarction ([place]) 2000  . Parkinson's disease ([place])   . Peripheral vascular disease ([place])   . Pneumonia    history of   . Polyneuropathy   . RLS (restless legs syndrome)   . Seizures ([place])    when in rehab for his hip  . Sleep apnea   . Smoker   . Spinal stenosis   . Strabismic amblyopia   . Tinnitus     Patient Active Problem List   Diagnosis Date Noted  . Sepsis ([place]) 05/01/2017  . Syncope 04/30/2017  . New onset atrial fibrillation  ([place]) 07/03/2016  . Loss of weight   . Gastritis without bleeding   . Occult GI bleeding   . GI bleed 03/02/2016  . Left-sided weakness 03/02/2016  . Rectal bleeding 01/16/2016  . Colitis presumed infectious 01/10/2016  . Pressure ulcer 12/29/2015  . Altered mental status 12/28/2015  . Fracture of femoral neck, left ([place]) 12/03/2015  . CAD (coronary artery disease) 12/03/2015  . Sleep apnea 12/03/2015  . Femoral neck fracture, left, closed, initial encounter 12/03/2015  . Unstable angina ([place]) 05/14/2015  . COPD (chronic obstructive pulmonary disease) ([place]) 02/05/2015  . Acute bronchitis 02/05/2015  . Type 2 diabetes mellitus ([place]) 02/05/2015  . Chest pain 12/09/2014  . Presence of drug coated stent in LAD coronary artery 07/11/2013  . Disease characterized by destruction of skeletal muscle 09/12/2009  . Encephalopathy 09/12/2009  . Vitamin D deficiency 05/05/2008  . Polyneuropathy in diabetes ([place]) 08/17/2005  . Spinal stenosis in cervical region 03/20/2005  . Essential tremor 01/17/2005  . Hypercholesterolemia 03/21/2004  . Hypertension, benign 01/08/2004  . 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 Localized osteoarthrosis, lower leg 04/07/2002  . Parkinson's disease ([place]) 09/21/2000  . Peptic ulcer with hemorrhage and perforation ([place]) 04/19/1996  . 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 ANKLE SURGERY    . APPENDECTOMY    . BACK SURGERY    . CATARACT EXTRACTION W/PHACO Left 08/26/2016   Procedure: CATARACT EXTRACTION PHACO AND INTRAOCULAR LENS PLACEMENT (IOC);  Surgeon: Birder Robson, MD;  Location: [facility] ORS;  Service: Ophthalmology;  Laterality: Left;  [place] 01:37 AP% 19.4 CDE 18.97 fluid pack lot # [id number]  . CORONARY ANGIOPLASTY     STENT  . CTR    . ESOPHAGOGASTRODUODENOSCOPY (EGD) WITH PROPOFOL N/A 03/04/2016   Procedure: ESOPHAGOGASTRODUODENOSCOPY (EGD) WITH PROPOFOL;  Surgeon: [name], MD;   Location: [facility] ENDOSCOPY;  Service: Endoscopy;  Laterality: N/A;  . EYE SURGERY     cataract  . FRACTURE SURGERY    . HIP PINNING,CANNULATED Left 12/04/2015   Procedure: CANNULATED HIP PINNING;  Surgeon: Thornton Park, MD;  Location: [facility] ORS;  Service: Orthopedics;  Laterality: Left;    Prior to Admission medications   Medication Sig Start Date End Date Taking? Authorizing Provider  acetaminophen (TYLENOL) 325 MG tablet Take 650 mg by mouth every 6 (six) hours as needed for moderate pain.    [provider]  albuterol (PROVENTIL HFA;VENTOLIN HFA) 108 (90 Base) MCG/ACT inhaler Inhale 1-2 puffs into the lungs every 4 (four) hours as needed for wheezing or shortness of breath.    [provider]  apixaban (ELIQUIS) 5 MG TABS tablet Take by mouth.    [provider]  aspirin EC 81 MG tablet Take 81 mg by mouth daily.    [provider]  atorvastatin (LIPITOR) 80 MG tablet Take 80 mg by mouth at bedtime.    [provider]  brimonidine-timolol (COMBIGAN) 0.2-0.5 % ophthalmic solution Place 1 drop into both eyes 2 (two) times daily.    [provider]  carbidopa-levodopa (SINEMET IR) 25-250 MG tablet Take 1 tablet by mouth 4 (four) times daily.     [provider]  Carboxymethylcellul-Glycerin (REFRESH OPTIVE OP) Apply 2 drops to eye every 8 (eight) hours as needed (dry eyes).    [provider]  cetirizine (ZYRTEC) 10 MG tablet Take 10 mg by mouth at bedtime as needed for allergies.     [provider]  citalopram (CELEXA) 20 MG tablet Take 20 mg by mouth daily.    [provider]  Dextromethorphan-Guaifenesin (TUSSIN DM) 10-100 MG/5ML liquid Take 10 mLs by mouth every 6 (six) hours as needed (cough).    [provider]  Dextrose, Diabetic Use, (GLUTOSE 15 PO) Take 1 Tube by mouth. For blood sugar less than 70, repeat in 5 minutes up to 2 doses    [provider]  diclofenac sodium  (VOLTAREN) 1 % GEL Apply topically. 01/26/17 01/26/18  [provider]  Difluprednate 0.05 % EMUL Apply 1 drop to eye 2 (two) times daily.    [provider]  diltiazem (CARDIZEM CD) 120 MG 24 hr capsule Take 1 capsule (120 mg total) by mouth daily. Patient taking differently: Take 120 mg by mouth 4 (four) times daily.  07/05/16   Bettey Costa, MD  ferrous sulfate 325 (65 FE) MG EC tablet Take 1 tablet (325 mg total) by mouth 2 (two) times daily. 05/06/17 07/05/17  Henreitta Leber, MD  fludrocortisone (FLORINEF) 0.1 MG tablet Take 0.1 mg by mouth daily.     [provider]  fluticasone (FLONASE) 50 MCG/ACT nasal spray Place 2 sprays into both nostrils daily as needed for rhinitis.    [provider]  Fluticasone-Salmeterol (ADVAIR DISKUS)  500-50 MCG/DOSE AEPB Inhale into the lungs.    [provider]  gabapentin (NEURONTIN) 600 MG tablet Take 600 mg by mouth 3 (three) times daily.     [provider]  HYDROcodone-acetaminophen (NORCO/VICODIN) 5-325 MG tablet Take 1 tablet by mouth daily as needed for moderate pain.     [provider]  insulin glargine (LANTUS) 100 UNIT/ML injection Inject 20 Units into the skin daily.    [provider]  levETIRAcetam (KEPPRA) 500 MG tablet Take 500 mg by mouth every 12 (twelve) hours.     [provider]  lidocaine (XYLOCAINE) 5 % ointment Apply 1 application topically as needed.    [provider]  loperamide (IMODIUM A-D) 2 MG tablet Take by mouth.    [provider]  magnesium hydroxide (MILK OF MAGNESIA) 400 MG/5ML suspension Take 15 mLs by mouth daily as needed for mild constipation.    [provider]  metFORMIN (GLUCOPHAGE) 1000 MG tablet Take 500 mg by mouth daily with breakfast.     [provider]  mirabegron ER (MYRBETRIQ) 50 MG TB24 tablet Take 50 mg by mouth daily.    [provider]  nitroGLYCERIN (NITROSTAT) 0.4 MG SL tablet  Place 0.4 mg under the tongue every 5 (five) minutes as needed for chest pain.    [provider]  ondansetron (ZOFRAN-ODT) 4 MG disintegrating tablet Take 4 mg by mouth.    [provider]  oxyCODONE-acetaminophen (PERCOCET) 10-325 MG tablet Take 1 tablet by mouth every 4 (four) hours as needed.    [provider]  pantoprazole (PROTONIX) 40 MG tablet Take 40 mg by mouth daily.    [provider]  potassium chloride (KLOR-CON) 20 MEQ packet Take 1 packet by mouth daily.    [provider]  predniSONE (DELTASONE) 10 MG tablet Label  & dispense according to the schedule below. 5 Pills PO for 1 day then, 4 Pills PO for 1 day, 3 Pills PO for 1 day, 2 Pills PO for 1 day, 1 Pill PO for 1 days then STOP. 05/06/17   Henreitta Leber, MD  pregabalin (LYRICA) 50 MG capsule Take 50 mg by mouth 2 (two) times daily. 01/26/17 01/26/18  [provider]  QUEtiapine (SEROQUEL) 25 MG tablet Take 25 mg by mouth. 01/16/16   [provider]  ranitidine (ZANTAC) 150 MG tablet Take 150 mg by mouth at bedtime.    [provider]  roflumilast (DALIRESP) 500 MCG TABS tablet Take 1 tablet by mouth daily.    [provider]  sennosides-docusate sodium (SENOKOT-S) 8.6-50 MG tablet Take 1 tablet by mouth daily.     [provider]  tiotropium (SPIRIVA) 18 MCG inhalation capsule Place 18 mcg into inhaler and inhale daily. 2 PUFFS    [provider]  traZODone (DESYREL) 50 MG tablet Take 50 mg by mouth at bedtime.    [provider]  vitamin B-12 500 MCG tablet Take 1 tablet (500 mcg total) by mouth daily. 05/06/17   Henreitta Leber, MD  Vitamin D, Ergocalciferol, (DRISDOL) 50000 units CAPS capsule Take 50,000 Units by mouth every 30 (thirty) days.    [provider]    Allergies Bupropion; Cheese; and Sulfamethoxazole-trimethoprim  Family History  Problem Relation Age of Onset  . Heart disease Father   .  Diabetes Mellitus II Other     Social History Social History   Tobacco Use  . Smoking status: Former Smoker    Packs/day:  0.50    Types: Cigarettes    Last attempt to quit: 11/17/2015    Years since quitting: 1.8  . Smokeless tobacco: Never Used  Substance Use Topics  . Alcohol use: No  . Drug use: No    Review of Systems  Constitutional: No fever. Eyes: Positive for right eye blurred vision. ENT: Positive for neck pain. Cardiovascular: Denies chest pain. Respiratory: Denies shortness of breath. Gastrointestinal: No nausea, no vomiting.  Genitourinary: Negative for flank pain.  Musculoskeletal: Negative for back pain. Skin: Negative for rash. Neurological: Positive for headache.  Negative for focal weakness or numbness.   ____________________________________________   PHYSICAL EXAM:  VITAL SIGNS: ED Triage Vitals  Enc Vitals Group     BP 09/22/17 1746 (!) 154/81     Pulse Rate 09/22/17 1746 77     Resp 09/22/17 1746 20     Temp 09/22/17 1746 98.8 F (37.1 C)     Temp Source 09/22/17 1746 Oral     SpO2 09/22/17 1746 95 %     Weight 09/22/17 1747 200 lb (90.7 kg)     Height 09/22/17 1747 5\' 8"  (1.727 m)     Head Circumference --      Peak Flow --      Pain Score 09/22/17 1746 9     Pain Loc --      Pain Edu? --      Excl. in [place]? --     Constitutional: Alert and oriented.  Relatively well appearing and in no acute distress. Eyes: Conjunctivae are normal.  EOMI.  PERRLA. Head: Atraumatic. Nose: No congestion/rhinnorhea. Mouth/Throat: Mucous membranes are somewhat dry. Neck: Normal range of motion.  Mild right paraspinal tenderness with no significant midline tenderness. Cardiovascular: Good peripheral circulation. Respiratory: Normal respiratory effort.   Gastrointestinal: Soft and nontender.  Genitourinary: No flank tenderness. Musculoskeletal: No lower extremity edema.  FROM to all extremities.  No deformity.  Extremities warm and well perfused.    Neurologic:  Normal speech and language.  Motor and sensory intact in all extremities.  Normal coordination with no ataxia on finger-to-nose.  No gross focal neurologic deficits are appreciated.  Skin:  Skin is warm and dry. No rash noted. Psychiatric: Mood and affect are normal. Speech and behavior are normal.  ____________________________________________   LABS (all labs ordered are listed, but only abnormal results are displayed)  Labs Reviewed  BASIC METABOLIC PANEL  TROPONIN I  CBC WITH DIFFERENTIAL/PLATELET  URINALYSIS, COMPLETE (UACMP) WITH MICROSCOPIC  BLOOD GAS, VENOUS   ____________________________________________  EKG   ____________________________________________  RADIOLOGY  CT head: No ICH or other acute findings CT C-spine: No acute fracture  ____________________________________________   PROCEDURES  Procedure(s) performed: No  Procedures  Critical Care performed: No ____________________________________________   INITIAL IMPRESSION / ASSESSMENT AND PLAN / ED COURSE  Pertinent labs & imaging results that were available during my care of the patient were reviewed by me and considered in my medical decision making (see chart for details).  77 year old male with PMH as noted above presents with head injury last night at 10 PM, with subsequent development of headache today which is gradually worsened and which is associated with some blurred vision in his right eye which is new.  On exam, the vital signs are normal except for hypertension, neuro exam is nonfocal, there is no visible trauma, and the remainder the exam is as described above.  Overall suspect patient has had concussion versus minor head injury.  I have a lower  suspicion for ICH given the normal neuro exam.  Will obtain a CT of the head to rule out acute trauma as well as CT of the C-spine.  No evidence of acute stroke or other CNS etiology.  Anticipate discharge home with ophthalmology  follow-up if imaging is negative.    ----------------------------------------- 8:50 PM on 09/22/2017 -----------------------------------------  Patient's imaging is negative.  On reassessment, however, the patient appears much more somnolent.  He was sleeping when I entered in the room.  I woke him up, and he was able to answer my questions although he was difficult to wake up and appears somewhat more lethargic with than when I initially saw him.  Given the negative imaging, this could be related to concussion, or he may just be sleepy having probably not gotten much good sleep last night, however I am also somewhat concerned that there may be an underlying cause that precipitated both fall and his increased sleepiness now.  We will obtain EKG, labs, UA, and chest x-ray (as patient demonstrated some wheezing type noises when I woke him and does endorse some shortness of breath).  If this additional work-up is negative and patient becomes more alert, I still anticipate possible discharge home.  ----------------------------------------- 9:40 PM on 09/22/2017 -----------------------------------------  I signed the patient out to the oncoming physician Dr. Quentin Cornwall.  Plan will be to await the results of the lab work-up.  If no concerning acute findings, then disposition will be based on patient's mental status and whether he returns to baseline.  ____________________________________________   FINAL CLINICAL IMPRESSION(S) / ED DIAGNOSES  Final diagnoses:  Concussion without loss of consciousness, initial encounter      NEW MEDICATIONS STARTED DURING THIS VISIT:  New Prescriptions   No medications on file     Note:  This document was prepared using Dragon voice recognition software and may include unintentional dictation errors.    Arta Silence, MD 09/22/17 2141

## 2017-09-22 NOTE — ED Provider Notes (Signed)
Patient received in sign-out from Dr. Cherylann Banas.  Workup and evaluation pending laboratory evaluation and reassessment.  Patient does have significant wheeze on exam does have mild hypercapnia.  Could explain part of his encephalopathy.  Do feel based on his fall, headache and apparent confusion after work-up in the ER that patient would benefit from observation in the hospital for additional nebulizer treatments.  We will give IV solumedrol.      Merlyn Lot, MD 09/22/17 2308

## 2017-09-22 NOTE — ED Triage Notes (Signed)
[facility] stating pt had a fall out of bed around 2200 last night. Pt was able to get back into bed and stated he initially had a HA but that it edged off. Pt stating that HA became better and then slowly began to worsen as the day went on. Pt stating also increasing blurriness in his right eye. Pt stating he already had vision defects in left eye.

## 2017-09-23 ENCOUNTER — Other Ambulatory Visit: Payer: Self-pay

## 2017-09-23 DIAGNOSIS — J441 Chronic obstructive pulmonary disease with (acute) exacerbation: Secondary | ICD-10-CM | POA: Diagnosis present

## 2017-09-23 LAB — BASIC METABOLIC PANEL
ANION GAP: 11 (ref 5–15)
BUN: 12 mg/dL (ref 6–20)
CALCIUM: 8.3 mg/dL — AB (ref 8.9–10.3)
CO2: 29 mmol/L (ref 22–32)
Chloride: 101 mmol/L (ref 101–111)
Creatinine, Ser: 0.84 mg/dL (ref 0.61–1.24)
GFR calc Af Amer: >60 mL/min (ref >60)
GLUCOSE: 224 mg/dL — AB (ref 65–99)
POTASSIUM: 3.2 mmol/L — AB (ref 3.5–5.1)
SODIUM: 141 mmol/L (ref 135–145)

## 2017-09-23 LAB — CBC
HCT: 35.5 % — ABNORMAL LOW (ref 40.0–52.0)
HEMOGLOBIN: 11.1 g/dL — AB (ref 13.0–18.0)
MCH: 22.5 pg — ABNORMAL LOW (ref 26.0–34.0)
MCHC: 31.2 g/dL — AB (ref 32.0–36.0)
MCV: 72.1 fL — ABNORMAL LOW (ref 80.0–100.0)
Platelets: 186 10*3/uL (ref 150–440)
RBC: 4.93 MIL/uL (ref 4.40–5.90)
RDW: 19 % — ABNORMAL HIGH (ref 11.5–14.5)
WBC: 6.1 10*3/uL (ref 3.8–10.6)

## 2017-09-23 LAB — URINALYSIS, COMPLETE (UACMP) WITH MICROSCOPIC
Bacteria, UA: NONE SEEN
Bilirubin Urine: NEGATIVE
GLUCOSE, UA: 50 mg/dL — AB
HGB URINE DIPSTICK: NEGATIVE
KETONES UR: 5 mg/dL — AB
Leukocytes, UA: NEGATIVE
Nitrite: NEGATIVE
PROTEIN: NEGATIVE mg/dL
SQUAMOUS EPITHELIAL / LPF: NONE SEEN (ref 0–5)
Specific Gravity, Urine: 1.012 (ref 1.005–1.030)
pH: 6 (ref 5.0–8.0)

## 2017-09-23 LAB — GLUCOSE, CAPILLARY
Glucose-Capillary: 293 mg/dL — ABNORMAL HIGH (ref 65–99)
Glucose-Capillary: 334 mg/dL — ABNORMAL HIGH (ref 65–99)

## 2017-09-23 MED ORDER — FERROUS SULFATE 325 (65 FE) MG PO TABS
325.0000 mg | ORAL_TABLET | Freq: Two times a day (BID) | ORAL | Status: DC
  Administered 2017-09-23: 325 mg via ORAL
  Filled 2017-09-23: qty 1

## 2017-09-23 MED ORDER — PANTOPRAZOLE SODIUM 40 MG PO TBEC
40.0000 mg | DELAYED_RELEASE_TABLET | Freq: Every day | ORAL | Status: DC
  Administered 2017-09-23: 40 mg via ORAL
  Filled 2017-09-23: qty 1

## 2017-09-23 MED ORDER — DOCUSATE SODIUM 100 MG PO CAPS
100.0000 mg | ORAL_CAPSULE | Freq: Two times a day (BID) | ORAL | Status: DC
  Filled 2017-09-23: qty 1

## 2017-09-23 MED ORDER — ASPIRIN EC 81 MG PO TBEC
81.0000 mg | DELAYED_RELEASE_TABLET | Freq: Every day | ORAL | Status: DC
  Administered 2017-09-23: 81 mg via ORAL
  Filled 2017-09-23: qty 1

## 2017-09-23 MED ORDER — METHYLPREDNISOLONE SODIUM SUCC 125 MG IJ SOLR
80.0000 mg | Freq: Two times a day (BID) | INTRAMUSCULAR | Status: DC
  Administered 2017-09-23: 80 mg via INTRAVENOUS
  Filled 2017-09-23: qty 2

## 2017-09-23 MED ORDER — CARBIDOPA-LEVODOPA 25-250 MG PO TABS
1.0000 | ORAL_TABLET | Freq: Four times a day (QID) | ORAL | Status: DC
  Administered 2017-09-23 (×2): 1 via ORAL
  Filled 2017-09-23 (×4): qty 1

## 2017-09-23 MED ORDER — IPRATROPIUM-ALBUTEROL 0.5-2.5 (3) MG/3ML IN SOLN
3.0000 mL | Freq: Four times a day (QID) | RESPIRATORY_TRACT | Status: DC
  Administered 2017-09-23 (×2): 3 mL via RESPIRATORY_TRACT
  Filled 2017-09-23 (×2): qty 3

## 2017-09-23 MED ORDER — TIMOLOL MALEATE 0.5 % OP SOLN
1.0000 [drp] | Freq: Two times a day (BID) | OPHTHALMIC | Status: DC
  Administered 2017-09-23: 1 [drp] via OPHTHALMIC
  Filled 2017-09-23: qty 5

## 2017-09-23 MED ORDER — DILTIAZEM HCL ER COATED BEADS 120 MG PO CP24
120.0000 mg | ORAL_CAPSULE | Freq: Every day | ORAL | Status: DC
  Administered 2017-09-23: 120 mg via ORAL
  Filled 2017-09-23: qty 1

## 2017-09-23 MED ORDER — FLUTICASONE PROPIONATE 50 MCG/ACT NA SUSP
2.0000 | Freq: Every day | NASAL | Status: DC | PRN
  Filled 2017-09-23: qty 16

## 2017-09-23 MED ORDER — INSULIN GLARGINE 100 UNIT/ML ~~LOC~~ SOLN
20.0000 [IU] | Freq: Every day | SUBCUTANEOUS | Status: DC
  Administered 2017-09-23: 20 [IU] via SUBCUTANEOUS
  Filled 2017-09-23: qty 0.2

## 2017-09-23 MED ORDER — POTASSIUM CHLORIDE 20 MEQ PO PACK: 40.0000 meq | PACK | Freq: Every day | ORAL | 0 refills | Status: DC

## 2017-09-23 MED ORDER — ONDANSETRON HCL 4 MG/2ML IJ SOLN: 4.0000 mg | Freq: Four times a day (QID) | INTRAMUSCULAR | Status: DC | PRN

## 2017-09-23 MED ORDER — TIOTROPIUM BROMIDE MONOHYDRATE 18 MCG IN CAPS
18.0000 ug | ORAL_CAPSULE | Freq: Every day | RESPIRATORY_TRACT | Status: DC
  Administered 2017-09-23: 18 ug via RESPIRATORY_TRACT
  Filled 2017-09-23: qty 5

## 2017-09-23 MED ORDER — PREGABALIN 50 MG PO CAPS
50.0000 mg | ORAL_CAPSULE | Freq: Two times a day (BID) | ORAL | Status: DC
  Administered 2017-09-23: 50 mg via ORAL
  Filled 2017-09-23: qty 1

## 2017-09-23 MED ORDER — DIFLUPREDNATE 0.05 % OP EMUL: 1.0000 [drp] | Freq: Two times a day (BID) | OPHTHALMIC | Status: DC

## 2017-09-23 MED ORDER — CARBOXYMETHYLCELLUL-GLYCERIN 0.5-0.9 % OP SOLN: 1.0000 [drp] | Freq: Three times a day (TID) | OPHTHALMIC | Status: DC | PRN

## 2017-09-23 MED ORDER — QUETIAPINE FUMARATE 25 MG PO TABS: 25.0000 mg | ORAL_TABLET | Freq: Every day | ORAL | Status: DC

## 2017-09-23 MED ORDER — VITAMIN D (ERGOCALCIFEROL) 1.25 MG (50000 UNIT) PO CAPS: 50000.0000 [IU] | ORAL_CAPSULE | ORAL | Status: DC

## 2017-09-23 MED ORDER — APIXABAN 5 MG PO TABS
5.0000 mg | ORAL_TABLET | Freq: Two times a day (BID) | ORAL | Status: DC
  Filled 2017-09-23: qty 1

## 2017-09-23 MED ORDER — ORAL CARE MOUTH RINSE: 15.0000 mL | Freq: Two times a day (BID) | OROMUCOSAL | Status: DC

## 2017-09-23 MED ORDER — ACETAMINOPHEN 650 MG RE SUPP: 650.0000 mg | Freq: Four times a day (QID) | RECTAL | Status: DC | PRN

## 2017-09-23 MED ORDER — VITAMIN B-12 1000 MCG PO TABS
500.0000 ug | ORAL_TABLET | Freq: Every day | ORAL | Status: DC
  Administered 2017-09-23: 500 ug via ORAL
  Filled 2017-09-23: qty 1

## 2017-09-23 MED ORDER — INSULIN ASPART 100 UNIT/ML ~~LOC~~ SOLN
0.0000 [IU] | Freq: Three times a day (TID) | SUBCUTANEOUS | Status: DC
  Administered 2017-09-23: 11 [IU] via SUBCUTANEOUS
  Administered 2017-09-23: 8 [IU] via SUBCUTANEOUS
  Filled 2017-09-23 (×2): qty 1

## 2017-09-23 MED ORDER — FAMOTIDINE 20 MG PO TABS
10.0000 mg | ORAL_TABLET | Freq: Every day | ORAL | Status: DC
  Administered 2017-09-23: 10 mg via ORAL
  Filled 2017-09-23: qty 1

## 2017-09-23 MED ORDER — HYDROCODONE-ACETAMINOPHEN 5-325 MG PO TABS: 1.0000 | ORAL_TABLET | ORAL | Status: DC | PRN

## 2017-09-23 MED ORDER — POTASSIUM CHLORIDE 20 MEQ PO PACK
20.0000 meq | PACK | Freq: Every day | ORAL | Status: DC
  Administered 2017-09-23: 20 meq via ORAL
  Filled 2017-09-23: qty 1

## 2017-09-23 MED ORDER — BRIMONIDINE TARTRATE 0.2 % OP SOLN
1.0000 [drp] | Freq: Two times a day (BID) | OPHTHALMIC | Status: DC
  Administered 2017-09-23: 1 [drp] via OPHTHALMIC
  Filled 2017-09-23: qty 5

## 2017-09-23 MED ORDER — TRAZODONE HCL 50 MG PO TABS: 50.0000 mg | ORAL_TABLET | Freq: Every day | ORAL | Status: DC

## 2017-09-23 MED ORDER — ATORVASTATIN CALCIUM 20 MG PO TABS: 80.0000 mg | ORAL_TABLET | Freq: Every day | ORAL | Status: DC

## 2017-09-23 MED ORDER — POLYVINYL ALCOHOL 1.4 % OP SOLN
1.0000 [drp] | Freq: Three times a day (TID) | OPHTHALMIC | Status: DC | PRN
  Filled 2017-09-23: qty 15

## 2017-09-23 MED ORDER — ONDANSETRON HCL 4 MG PO TABS: 4.0000 mg | ORAL_TABLET | Freq: Four times a day (QID) | ORAL | Status: DC | PRN

## 2017-09-23 MED ORDER — FLUDROCORTISONE ACETATE 0.1 MG PO TABS
0.1000 mg | ORAL_TABLET | Freq: Every day | ORAL | Status: DC
  Administered 2017-09-23: 0.1 mg via ORAL
  Filled 2017-09-23: qty 1

## 2017-09-23 MED ORDER — LEVETIRACETAM 500 MG PO TABS
500.0000 mg | ORAL_TABLET | Freq: Two times a day (BID) | ORAL | Status: DC
  Administered 2017-09-23: 500 mg via ORAL
  Filled 2017-09-23 (×3): qty 1

## 2017-09-23 MED ORDER — TRAZODONE HCL 50 MG PO TABS: 25.0000 mg | ORAL_TABLET | Freq: Every evening | ORAL | Status: DC | PRN

## 2017-09-23 MED ORDER — ACETAMINOPHEN 325 MG PO TABS
650.0000 mg | ORAL_TABLET | Freq: Four times a day (QID) | ORAL | Status: DC | PRN
Start: 2017-09-23 — End: 2017-09-23

## 2017-09-23 MED ORDER — POTASSIUM CHLORIDE CRYS ER 20 MEQ PO TBCR
40.0000 meq | EXTENDED_RELEASE_TABLET | Freq: Two times a day (BID) | ORAL | Status: DC
  Administered 2017-09-23: 40 meq via ORAL
  Filled 2017-09-23: qty 2

## 2017-09-23 MED ORDER — BRIMONIDINE TARTRATE-TIMOLOL 0.2-0.5 % OP SOLN
1.0000 [drp] | Freq: Two times a day (BID) | OPHTHALMIC | Status: DC
  Filled 2017-09-23: qty 5

## 2017-09-23 MED ORDER — FERROUS SULFATE 325 (65 FE) MG PO TBEC: 325.0000 mg | DELAYED_RELEASE_TABLET | ORAL | 0 refills | Status: DC

## 2017-09-23 MED ORDER — BISACODYL 5 MG PO TBEC: 5.0000 mg | DELAYED_RELEASE_TABLET | Freq: Every day | ORAL | Status: DC | PRN

## 2017-09-23 MED ORDER — LORATADINE 10 MG PO TABS
10.0000 mg | ORAL_TABLET | Freq: Every day | ORAL | Status: DC
  Administered 2017-09-23: 10 mg via ORAL
  Filled 2017-09-23: qty 1

## 2017-09-23 MED ORDER — NITROGLYCERIN 0.4 MG SL SUBL: 0.4000 mg | SUBLINGUAL_TABLET | SUBLINGUAL | Status: DC | PRN

## 2017-09-23 MED ORDER — ROFLUMILAST 500 MCG PO TABS
500.0000 ug | ORAL_TABLET | Freq: Every day | ORAL | Status: DC
  Administered 2017-09-23: 500 ug via ORAL
  Filled 2017-09-23: qty 1

## 2017-09-23 MED ORDER — CITALOPRAM HYDROBROMIDE 20 MG PO TABS
20.0000 mg | ORAL_TABLET | Freq: Every day | ORAL | Status: DC
  Administered 2017-09-23: 20 mg via ORAL
  Filled 2017-09-23: qty 1

## 2017-09-23 MED ORDER — FLUTICASONE FUROATE-VILANTEROL 200-25 MCG/INH IN AEPB
1.0000 | INHALATION_SPRAY | Freq: Every day | RESPIRATORY_TRACT | Status: DC
  Administered 2017-09-23: 1 via RESPIRATORY_TRACT
  Filled 2017-09-23: qty 28

## 2017-09-23 MED ORDER — INSULIN ASPART 100 UNIT/ML ~~LOC~~ SOLN: 0.0000 [IU] | Freq: Every day | SUBCUTANEOUS | Status: DC

## 2017-09-23 NOTE — ED Notes (Signed)
Patient transported to room 136 by this EDT.

## 2017-09-23 NOTE — ED Notes (Signed)
Placed pt on 3L nasal cannula for comfort while sleeping

## 2017-09-23 NOTE — Care Management (Signed)
Notified Lauren, RN with [facility] of patients discharge.

## 2017-09-23 NOTE — Progress Notes (Signed)
Patient received from ED lethargic and only responding to sternal rubs. This morning patient is more alert and is oriented without complaints of pain. Patient did sleep entire the night. Received potassium replacement this morning.

## 2017-09-23 NOTE — Progress Notes (Signed)
Pt discharged to home via wheelchair with [facility] without incident per MD order. No change in patient from AM assessment. Pt pain controlled on discharge. Prior to d/c all d/c teachings done both written and verbal. All questions answered. Pt verbalizes understanding of all teachings and agrees to comply. D/C packet discharged with patient to home.

## 2017-09-23 NOTE — Progress Notes (Signed)
MEDICATION RELATED CONSULT NOTE - INITIAL   Pharmacy Consult for electrolyte management Indication: hypokalemia  Allergies  Allergen Reactions  . Bupropion Other (See Comments)    Seizure  . Cheese     Patient doesn't want any cheese  . Sulfamethoxazole-Trimethoprim Other (See Comments)    Patient Measurements: Height: 5\' 8"  (172.7 cm) Weight: 200 lb (90.7 kg) IBW/kg (Calculated) : 68.4  Vital Signs: Temp: 97.7 F (36.5 C) (05/08 0201) Temp Source: Axillary (05/08 0201) BP: 163/82 (05/08 0201) Pulse Rate: 81 (05/08 0201) Intake/Output from previous day: No intake/output data recorded. Intake/Output from this shift: No intake/output data recorded.  Labs: Recent Labs    09/22/17 2153 09/23/17 0415  WBC 7.4 6.1  HGB 10.8* 11.1*  HCT 34.9* 35.5*  PLT 167 186  CREATININE 0.98 0.84   Estimated Creatinine Clearance: 80.5 mL/min (by C-G formula based on SCr of 0.84 mg/dL).   Microbiology: No results found for this or any previous visit (from the past 720 hour(s)).  Medical History: Past Medical History:  Diagnosis Date  . Allergic rhinitis   . Anginal pain ([place])   . Arthritis   . Autonomic dysreflexia   . Back pain    CHRONIC LBP,STENOSIS  . CHF (congestive heart failure) ([place])   . Chronic kidney disease    STAGE 3  . COPD (chronic obstructive pulmonary disease) ([place])   . Coronary artery disease   . Depression   . Diabetes ([place])   . Diabetes mellitus without complication ([place])   . DJD (degenerative joint disease)   . Dysphagia   . Dysrhythmia    A-fib, now converted  . Edema    FEET/ANKLES  . GERD (gastroesophageal reflux disease)   . Glaucoma   . H/O orthostatic hypotension   . Hypercholesterolemia   . Myocardial infarction ([place]) 2000  . Parkinson's disease ([place])   . Peripheral vascular disease ([place])   . Pneumonia    history of   . Polyneuropathy   . RLS (restless legs syndrome)   . Seizures ([place])    when in rehab for his hip  . Sleep apnea    . Smoker   . Spinal stenosis   . Strabismic amblyopia   . Tinnitus     Medications:  Scheduled:  . apixaban  5 mg Oral BID  . aspirin EC  81 mg Oral Daily  . atorvastatin  80 mg Oral QHS  . brimonidine  1 drop Both Eyes BID   And  . timolol  1 drop Both Eyes BID  . carbidopa-levodopa  1 tablet Oral QID  . citalopram  20 mg Oral Daily  . Difluprednate  1 drop Ophthalmic BID  . diltiazem  120 mg Oral Daily  . docusate sodium  100 mg Oral BID  . famotidine  10 mg Oral Daily  . ferrous sulfate  325 mg Oral BID  . fludrocortisone  0.1 mg Oral Daily  . fluticasone furoate-vilanterol  1 puff Inhalation Daily  . insulin aspart  0-15 Units Subcutaneous TID WC  . insulin aspart  0-5 Units Subcutaneous QHS  . insulin glargine  20 Units Subcutaneous Daily  . ipratropium-albuterol  3 mL Nebulization Q6H  . levETIRAcetam  500 mg Oral Q12H  . loratadine  10 mg Oral Daily  . mouth rinse  15 mL Mouth Rinse BID  . methylPREDNISolone (SOLU-MEDROL) injection  80 mg Intravenous Q12H  . pantoprazole  40 mg Oral Daily  . potassium chloride  20 mEq Oral Daily  .  potassium chloride  40 mEq Oral BID  . pregabalin  50 mg Oral BID  . QUEtiapine  25 mg Oral QHS  . roflumilast  500 mcg Oral Daily  . tiotropium  18 mcg Inhalation Daily  . traZODone  50 mg Oral QHS  . vitamin B-12  500 mcg Oral Daily  . [START ON 09/28/2017] Vitamin D (Ergocalciferol)  50,000 Units Oral Q30 days    Assessment: Patient admitted for fall found to have K of 3.2  Goal of Therapy:  K 3.5 - 4.5  Plan:  Will replace w/ KCI 40 mEq PO bid x 2 doses in addition to KCI 20 mEq packets daily and will recheck electrolytes w/ am labs.  Tobie Lords, PharmD, BCPS Clinical Pharmacist 09/23/2017

## 2017-09-23 NOTE — H&P (Addendum)
[facility] [place] NAME: Jimmy Hill    MR#:  [id number]  DATE OF BIRTH:  04/25/1941  DATE OF ADMISSION:  09/22/2017  PRIMARY CARE PHYSICIAN: [facility] [place]   REQUESTING/REFERRING PHYSICIAN:   CHIEF COMPLAINT:   Chief Complaint  Patient presents with  . Fall    HISTORY OF PRESENT ILLNESS: Jimmy Hill  is a 77 y.o. male with a known history of osteoarthritis, chronic back pain, CHF, chronic kidney disease, COPD, coronary artery disease, OSA, diabetes and other comorbidities. Patient was brought to emergency room for headache status post mechanical fall yesterday evening; patient fell out of bed and hit his head.  Headache got worse in the next 24 hours.  He also complained of right eye worsening blurry vision.  The headache and blurry vision improved in the emergency room with pain medication.  Brain CT scan was negative for intracranial hemorrhage or other acute abnormalities. However, while in the emergency room, patient was noted to be in respiratory distress with dyspnea, and wheezing and gradually becoming more lethargic.  ABG was done and CO2 was found to be elevated at 62.  Oxygen saturation was 89-91%.  Chest x-ray is negative for acute cardiopulmonary disease.  Blood tests show low potassium at 3.2 and chronic anemia with hemoglobin 10.8.  Patient is admitted as observation, for further evaluation and treatment.  PAST MEDICAL HISTORY:   Past Medical History:  Diagnosis Date  . Allergic rhinitis   . Anginal pain ([place])   . Arthritis   . Autonomic dysreflexia   . Back pain    CHRONIC LBP,STENOSIS  . CHF (congestive heart failure) ([place])   . Chronic kidney disease    STAGE 3  . COPD (chronic obstructive pulmonary disease) ([place])   . Coronary artery disease   . Depression   . Diabetes ([place])   . Diabetes mellitus without complication ([place])   . DJD (degenerative joint disease)   . Dysphagia    . Dysrhythmia    A-fib, now converted  . Edema    FEET/ANKLES  . GERD (gastroesophageal reflux disease)   . Glaucoma   . H/O orthostatic hypotension   . Hypercholesterolemia   . Myocardial infarction ([place]) 2000  . Parkinson's disease ([place])   . Peripheral vascular disease ([place])   . Pneumonia    history of   . Polyneuropathy   . RLS (restless legs syndrome)   . Seizures ([place])    when in rehab for his hip  . Sleep apnea   . Smoker   . Spinal stenosis   . Strabismic amblyopia   . Tinnitus     PAST SURGICAL HISTORY:  Past Surgical History:  Procedure Laterality Date  . ANKLE SURGERY    . APPENDECTOMY    . BACK SURGERY    . CATARACT EXTRACTION W/PHACO Left 08/26/2016   Procedure: CATARACT EXTRACTION PHACO AND INTRAOCULAR LENS PLACEMENT (IOC);  Surgeon: Birder Robson, MD;  Location: [facility] ORS;  Service: Ophthalmology;  Laterality: Left;  [place] 01:37 AP% 19.4 CDE 18.97 fluid pack lot # [id number]  . CORONARY ANGIOPLASTY     STENT  . CTR    . ESOPHAGOGASTRODUODENOSCOPY (EGD) WITH PROPOFOL N/A 03/04/2016   Procedure: ESOPHAGOGASTRODUODENOSCOPY (EGD) WITH PROPOFOL;  Surgeon: [name], MD;  Location: [facility] ENDOSCOPY;  Service: Endoscopy;  Laterality: N/A;  . EYE SURGERY     cataract  . FRACTURE SURGERY    . HIP PINNING,CANNULATED Left 12/04/2015  Procedure: CANNULATED HIP PINNING;  Surgeon: Thornton Park, MD;  Location: [facility] ORS;  Service: Orthopedics;  Laterality: Left;    SOCIAL HISTORY:  Social History   Tobacco Use  . Smoking status: Former Smoker    Packs/day: 0.50    Types: Cigarettes    Last attempt to quit: 11/17/2015    Years since quitting: 1.8  . Smokeless tobacco: Never Used  Substance Use Topics  . Alcohol use: No    FAMILY HISTORY:  Family History  Problem Relation Age of Onset  . Heart disease Father   . Diabetes Mellitus II Other     DRUG ALLERGIES:  Allergies  Allergen Reactions  . Bupropion Other (See Comments)    Seizure  . Cheese      Patient doesn't want any cheese  . Sulfamethoxazole-Trimethoprim Other (See Comments)    REVIEW OF SYSTEMS:   CONSTITUTIONAL: No fever, fatigue or weakness.  EYES: Positive for right eye blurry vision.  EARS, NOSE, AND THROAT: No tinnitus or ear pain.  RESPIRATORY: No cough, but positive for shortness of breath and wheezing; no hemoptysis.  CARDIOVASCULAR: No chest pain, orthopnea, edema.  GASTROINTESTINAL: No nausea, vomiting, diarrhea or abdominal pain.  GENITOURINARY: No dysuria, hematuria.  ENDOCRINE: No polyuria, nocturia,  HEMATOLOGY: No anemia, easy bruising or bleeding SKIN: No rash or lesion. MUSCULOSKELETAL: No joint pain.   NEUROLOGIC: No focal weakness.  PSYCHIATRY: No anxiety or depression.   MEDICATIONS AT HOME:  Prior to Admission medications   Medication Sig Start Date End Date Taking? Authorizing Provider  acetaminophen (TYLENOL) 325 MG tablet Take 650 mg by mouth every 6 (six) hours as needed for moderate pain.    [provider]  albuterol (PROVENTIL HFA;VENTOLIN HFA) 108 (90 Base) MCG/ACT inhaler Inhale 1-2 puffs into the lungs every 4 (four) hours as needed for wheezing or shortness of breath.    [provider]  apixaban (ELIQUIS) 5 MG TABS tablet Take by mouth.    [provider]  aspirin EC 81 MG tablet Take 81 mg by mouth daily.    [provider]  atorvastatin (LIPITOR) 80 MG tablet Take 80 mg by mouth at bedtime.    [provider]  brimonidine-timolol (COMBIGAN) 0.2-0.5 % ophthalmic solution Place 1 drop into both eyes 2 (two) times daily.    [provider]  carbidopa-levodopa (SINEMET IR) 25-250 MG tablet Take 1 tablet by mouth 4 (four) times daily.     [provider]  Carboxymethylcellul-Glycerin (REFRESH OPTIVE OP) Apply 2 drops to eye every 8 (eight) hours as needed (dry eyes).    [provider]  cetirizine (ZYRTEC) 10 MG tablet Take 10 mg by mouth at bedtime as needed for  allergies.     [provider]  citalopram (CELEXA) 20 MG tablet Take 20 mg by mouth daily.    [provider]  Dextromethorphan-Guaifenesin (TUSSIN DM) 10-100 MG/5ML liquid Take 10 mLs by mouth every 6 (six) hours as needed (cough).    [provider]  Dextrose, Diabetic Use, (GLUTOSE 15 PO) Take 1 Tube by mouth. For blood sugar less than 70, repeat in 5 minutes up to 2 doses    [provider]  diclofenac sodium (VOLTAREN) 1 % GEL Apply topically. 01/26/17 01/26/18  [provider]  Difluprednate 0.05 % EMUL Apply 1 drop to eye 2 (two) times daily.    [provider]  diltiazem (CARDIZEM CD) 120 MG 24 hr capsule Take 1 capsule (120 mg total) by mouth  daily. Patient taking differently: Take 120 mg by mouth 4 (four) times daily.  07/05/16   Bettey Costa, MD  ferrous sulfate 325 (65 FE) MG EC tablet Take 1 tablet (325 mg total) by mouth 2 (two) times daily. 05/06/17 07/05/17  Henreitta Leber, MD  fludrocortisone (FLORINEF) 0.1 MG tablet Take 0.1 mg by mouth daily.     [provider]  fluticasone (FLONASE) 50 MCG/ACT nasal spray Place 2 sprays into both nostrils daily as needed for rhinitis.    [provider]  Fluticasone-Salmeterol (ADVAIR DISKUS) 500-50 MCG/DOSE AEPB Inhale into the lungs.    [provider]  gabapentin (NEURONTIN) 600 MG tablet Take 600 mg by mouth 3 (three) times daily.     [provider]  HYDROcodone-acetaminophen (NORCO/VICODIN) 5-325 MG tablet Take 1 tablet by mouth daily as needed for moderate pain.     [provider]  insulin glargine (LANTUS) 100 UNIT/ML injection Inject 20 Units into the skin daily.    [provider]  levETIRAcetam (KEPPRA) 500 MG tablet Take 500 mg by mouth every 12 (twelve) hours.     [provider]  lidocaine (XYLOCAINE) 5 % ointment Apply 1 application topically as needed.    [provider]  loperamide (IMODIUM A-D) 2 MG  tablet Take by mouth.    [provider]  magnesium hydroxide (MILK OF MAGNESIA) 400 MG/5ML suspension Take 15 mLs by mouth daily as needed for mild constipation.    [provider]  metFORMIN (GLUCOPHAGE) 1000 MG tablet Take 500 mg by mouth daily with breakfast.     [provider]  mirabegron ER (MYRBETRIQ) 50 MG TB24 tablet Take 50 mg by mouth daily.    [provider]  nitroGLYCERIN (NITROSTAT) 0.4 MG SL tablet Place 0.4 mg under the tongue every 5 (five) minutes as needed for chest pain.    [provider]  ondansetron (ZOFRAN-ODT) 4 MG disintegrating tablet Take 4 mg by mouth.    [provider]  oxyCODONE-acetaminophen (PERCOCET) 10-325 MG tablet Take 1 tablet by mouth every 4 (four) hours as needed.    [provider]  pantoprazole (PROTONIX) 40 MG tablet Take 40 mg by mouth daily.    [provider]  potassium chloride (KLOR-CON) 20 MEQ packet Take 1 packet by mouth daily.    [provider]  predniSONE (DELTASONE) 10 MG tablet Label  & dispense according to the schedule below. 5 Pills PO for 1 day then, 4 Pills PO for 1 day, 3 Pills PO for 1 day, 2 Pills PO for 1 day, 1 Pill PO for 1 days then STOP. 05/06/17   Henreitta Leber, MD  pregabalin (LYRICA) 50 MG capsule Take 50 mg by mouth 2 (two) times daily. 01/26/17 01/26/18  [provider]  QUEtiapine (SEROQUEL) 25 MG tablet Take 25 mg by mouth. 01/16/16   [provider]  ranitidine (ZANTAC) 150 MG tablet Take 150 mg by mouth at bedtime.    [provider]  roflumilast (DALIRESP) 500 MCG TABS tablet Take 1 tablet by mouth daily.    [provider]  sennosides-docusate sodium (SENOKOT-S) 8.6-50 MG tablet Take 1 tablet by mouth daily.     [provider]  tiotropium (SPIRIVA) 18 MCG inhalation capsule Place 18 mcg into inhaler and inhale daily. 2 PUFFS    [provider]  traZODone (DESYREL) 50 MG tablet Take 50  mg by mouth at bedtime.    [provider]  vitamin  B-12 500 MCG tablet Take 1 tablet (500 mcg total) by mouth daily. 05/06/17   Henreitta Leber, MD  Vitamin D, Ergocalciferol, (DRISDOL) 50000 units CAPS capsule Take 50,000 Units by mouth every 30 (thirty) days.    [provider]      PHYSICAL EXAMINATION:   VITAL SIGNS: Blood pressure (!) 163/82, pulse 81, temperature 97.7 F (36.5 C), temperature source Axillary, resp. rate 19, height 5\' 8"  (1.727 m), weight 90.7 kg (200 lb), SpO2 96 %.  GENERAL:  77 y.o.-year-old patient lying in the bed with no acute distress, but somnolent.  EYES: Pupils equal, round, reactive to light and accommodation. No scleral icterus.   HEENT: Head atraumatic, normocephalic. Oropharynx and nasopharynx clear.  NECK:  Supple, no jugular venous distention. No thyroid enlargement, no tenderness.  LUNGS: Reduced breath sounds and scattered wheezing noted bilaterally. No use of accessory muscles of respiration.  CARDIOVASCULAR: S1, S2 normal. No murmurs, rubs, or gallops.  ABDOMEN: Soft, nontender, nondistended. Bowel sounds present. No organomegaly or mass.  EXTREMITIES: No pedal edema, cyanosis, or clubbing.  NEUROLOGIC EXAM: Is limited, due to patient being somnolent, not following commands. PSYCHIATRIC: The patient is lethargic, somnolent.  SKIN: No obvious rash, lesion, or ulcer.   LABORATORY PANEL:   CBC Recent Labs  Lab 09/22/17 2153  WBC 7.4  HGB 10.8*  HCT 34.9*  PLT 167  MCV 72.6*  MCH 22.4*  MCHC 30.9*  RDW 19.0*  LYMPHSABS 2.1  MONOABS 0.7  EOSABS 0.3  BASOSABS 0.1   ------------------------------------------------------------------------------------------------------------------  Chemistries  Recent Labs  Lab 09/22/17 2153  NA 139  K 3.2*  CL 101  CO2 33*  GLUCOSE 170*  BUN 11  CREATININE 0.98  CALCIUM 8.3*    ------------------------------------------------------------------------------------------------------------------ estimated creatinine clearance is 69 mL/min (by C-G formula based on SCr of 0.98 mg/dL). ------------------------------------------------------------------------------------------------------------------ No results for input(s): TSH, T4TOTAL, T3FREE, THYROIDAB in the last 72 hours.  Invalid input(s): FREET3   Coagulation profile No results for input(s): INR, PROTIME in the last 168 hours. ------------------------------------------------------------------------------------------------------------------- No results for input(s): DDIMER in the last 72 hours. -------------------------------------------------------------------------------------------------------------------  Cardiac Enzymes Recent Labs  Lab 09/22/17 2153  TROPONINI <0.03   ------------------------------------------------------------------------------------------------------------------ Invalid input(s): POCBNP  ---------------------------------------------------------------------------------------------------------------  Urinalysis    Component Value Date/Time   COLORURINE YELLOW (A) 09/22/2017 2208   APPEARANCEUR CLEAR (A) 09/22/2017 2208   APPEARANCEUR Clear 12/07/2013 2131   LABSPEC 1.012 09/22/2017 2208   LABSPEC 1.015 12/07/2013 2131   PHURINE 6.0 09/22/2017 2208   GLUCOSEU 50 (A) 09/22/2017 2208   GLUCOSEU >=500 12/07/2013 2131   HGBUR NEGATIVE 09/22/2017 2208   BILIRUBINUR NEGATIVE 09/22/2017 2208   BILIRUBINUR Negative 12/07/2013 2131   KETONESUR 5 (A) 09/22/2017 2208   PROTEINUR NEGATIVE 09/22/2017 2208   NITRITE NEGATIVE 09/22/2017 2208   LEUKOCYTESUR NEGATIVE 09/22/2017 2208   LEUKOCYTESUR Negative 12/07/2013 2131     RADIOLOGY: Ct Head Wo Contrast  Result Date: 09/22/2017 CLINICAL DATA:  Fall.  Pain.  Headache.  Blurred vision. EXAM: CT HEAD WITHOUT CONTRAST CT CERVICAL SPINE  WITHOUT CONTRAST TECHNIQUE: Multidetector CT imaging of the head and cervical spine was performed following the standard protocol without intravenous contrast. Multiplanar CT image reconstructions of the cervical spine were also generated. COMPARISON:  04/30/2017. FINDINGS: CT HEAD FINDINGS Brain: No evidence for acute infarction, hemorrhage, mass lesion, hydrocephalus, or extra-axial fluid. Generalized atrophy. Chronic microvascular ischemic change. Vascular: Calcification of the cavernous internal carotid arteries consistent with cerebrovascular atherosclerotic disease. No signs of intracranial large vessel occlusion. Skull:  Calvarium is intact. No fracture or worrisome osseous lesion. Sinuses/Orbits: Clear sinuses. No acute orbital findings. Prior cataract extraction. Other: None. CT CERVICAL SPINE FINDINGS Alignment: Straightening of the normal cervical lordosis. Trace anterolisthesis C5-6 and C4-5. Skull base and vertebrae: No acute fracture. No primary bone lesion or focal pathologic process. 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 No visible canal hematoma. No prevertebral fluid. Carotid atherosclerosis. Disc levels: Multilevel spondylosis, similar to priors. Underline facet disease. Disc space narrowing most pronounced at C5-6 and C6-7. Upper chest: No lung apex lesion. Other: None. IMPRESSION: No skull fracture or intracranial hemorrhage. No cervical spine fracture or traumatic subluxation. Atrophy and small vessel disease. Widespread cervical spondylosis. Electronically Signed   By: Staci Righter M.D.   On: 09/22/2017 19:28   Ct Cervical Spine Wo Contrast  Result Date: 09/22/2017 CLINICAL DATA:  Fall.  Pain.  Headache.  Blurred vision. EXAM: CT HEAD WITHOUT CONTRAST CT CERVICAL SPINE WITHOUT CONTRAST TECHNIQUE: Multidetector CT imaging of the head and cervical  spine was performed following the standard protocol without intravenous contrast. Multiplanar CT image reconstructions of the cervical spine were also generated. COMPARISON:  04/30/2017. FINDINGS: CT HEAD FINDINGS Brain: No evidence for acute infarction, hemorrhage, mass lesion, hydrocephalus, or extra-axial fluid. Generalized atrophy. Chronic microvascular ischemic change. Vascular: Calcification of the cavernous internal carotid arteries consistent with cerebrovascular atherosclerotic disease. No signs of intracranial large vessel occlusion. Skull: Calvarium is intact. No fracture or worrisome osseous lesion. Sinuses/Orbits: Clear sinuses. No acute orbital findings. Prior cataract extraction. Other: None. CT CERVICAL SPINE FINDINGS Alignment: Straightening of the normal cervical lordosis. Trace anterolisthesis C5-6 and C4-5. Skull base and vertebrae: No acute fracture. No primary bone lesion or focal pathologic process. 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 No visible canal hematoma. No prevertebral fluid. Carotid atherosclerosis. Disc levels: Multilevel spondylosis, similar to priors. Underline facet disease. Disc space narrowing most pronounced at C5-6 and C6-7. Upper chest: No lung apex lesion. Other: None. IMPRESSION: No skull fracture or intracranial hemorrhage. No cervical spine fracture or traumatic subluxation. Atrophy and small vessel disease. Widespread cervical spondylosis. Electronically Signed   By: Staci Righter M.D.   On: 09/22/2017 19:28   Dg Chest Portable 1 View  Result Date: 09/22/2017 CLINICAL DATA:  Patient fell out of bed at 2200 hours last evening. Dyspnea with cough. EXAM: PORTABLE CHEST 1 VIEW COMPARISON:  08/04/2017 FINDINGS: Stable cardiomediastinal silhouette with minimal aortic atherosclerosis. No aortic aneurysm. Chronic mild interstitial  prominence is seen of the lungs without focal pulmonary consolidation, effusion or pneumothorax. Degenerative changes are present about both AC and glenohumeral joints. IMPRESSION: No active pulmonary disease.  Aortic atherosclerosis. Electronically Signed   By: Ashley Royalty M.D.   On: 09/22/2017 21:09    EKG: Orders placed or performed during the hospital encounter of 09/22/17  . ED EKG  . ED EKG    IMPRESSION AND PLAN:  1.  Acute encephalopathy, likely secondary to hypercapnia from COPD exacerbation and obstructive sleep apnea.  Patient is a good candidate for CPAP at night.  Will treat with nebulizer treatments, steroids and oxygen therapy to maintain oxygen saturation around 92 to 94%.  Continue to monitor clinically closely. 2.  Headache, status post fall, resolved status post pain medication in the emergency room.  No  signs of injuries, brain and cervical spine CAT scans.  Continue to monitor clinically closely. 3.  Acute respiratory failure with hypoxia and hypercapnea, secondary to acute COPD exacerbation.  Will treat with oxygen therapy, nebulizer treatments and IV steroids. 4.  Acute COPD exacerbation, treatment above under #3. 5.  Hypokalemia.  Will replace potassium per protocol.   All the records are reviewed and case discussed with ED provider.   CODE STATUS:    Code Status Orders  (From admission, onward)        Start     Ordered   09/23/17 0202  Full code  Continuous     09/23/17 0201    Code Status History    Date Active Date Inactive Code Status Order ID Comments User Context   04/30/2017 0834 05/06/2017 2029 Full Code [id number]  Harrie Foreman, MD Inpatient   07/04/2016 0028 07/04/2016 1846 Full Code [id number]  SledgeUbaldo Glassing, DO Inpatient   07/04/2016 0028 07/04/2016 0028 Full Code [id number]  HugelmeyerUbaldo Glassing, DO Inpatient   03/03/2016 0013 03/04/2016 2200 Full Code [id number]  Saundra Shelling, MD Inpatient   12/28/2015 1718 12/30/2015 2008 Full Code  [id number]  Henreitta Leber, MD Inpatient   12/03/2015 2059 12/04/2015 1530 Full Code [id number]  Thornton Park, MD ED   02/05/2015 1614 02/06/2015 1404 Full Code [id number]  Theodoro Grist, MD Inpatient   12/10/2014 0047 12/10/2014 1636 Full Code [id number]  Nicholes Mango, MD Inpatient       TOTAL TIME TAKING CARE OF THIS PATIENT: 45 minutes.    Amelia Jo M.D on 09/23/2017 at 4:42 AM  Between 7am to 6pm - Pager - [phone]  After 6pm go to [email or web address] - password [email or web address] [facility]  [facility]  Office  [phone]  CC: Primary care physician; [facility] [place]

## 2017-09-24 LAB — BLOOD GAS, VENOUS
ACID-BASE EXCESS: 9.7 mmol/L — AB (ref 0.0–2.0)
BICARBONATE: 36.7 mmol/L — AB (ref 20.0–28.0)
PH VEN: 7.38 (ref 7.250–7.430)
Patient temperature: 37
pCO2, Ven: 62 mmHg — ABNORMAL HIGH (ref 44.0–60.0)

## 2017-09-24 NOTE — Discharge Summary (Signed)
[facility] NAME: Jimmy Hill    MR#:  [id number]  [place] OF BIRTH:  08-28-1940  DATE OF ADMISSION:  09/22/2017   ADMITTING PHYSICIAN: Amelia Jo, MD  DATE OF DISCHARGE: 09/23/2017  3:45 PM  PRIMARY CARE PHYSICIAN: [facility] [place]   ADMISSION DIAGNOSIS:   COPD exacerbation ([place]) [J44.1] Concussion without loss of consciousness, initial encounter [S06.0X0A]  DISCHARGE DIAGNOSIS:   Active Problems:   Acute exacerbation of chronic obstructive pulmonary disease (COPD) ([place])   SECONDARY DIAGNOSIS:   Past Medical History:  Diagnosis Date  . Allergic rhinitis   . Anginal pain ([place])   . Arthritis   . Autonomic dysreflexia   . Back pain    CHRONIC LBP,STENOSIS  . CHF (congestive heart failure) ([place])   . Chronic kidney disease    STAGE 3  . COPD (chronic obstructive pulmonary disease) ([place])   . Coronary artery disease   . Depression   . Diabetes ([place])   . Diabetes mellitus without complication ([place])   . DJD (degenerative joint disease)   . Dysphagia   . Dysrhythmia    A-fib, now converted  . Edema    FEET/ANKLES  . GERD (gastroesophageal reflux disease)   . Glaucoma   . H/O orthostatic hypotension   . Hypercholesterolemia   . Myocardial infarction ([place]) 2000  . Parkinson's disease ([place])   . Peripheral vascular disease ([place])   . Pneumonia    history of   . Polyneuropathy   . RLS (restless legs syndrome)   . Seizures ([place])    when in rehab for his hip  . Sleep apnea   . Smoker   . Spinal stenosis   . Strabismic amblyopia   . Tinnitus     HOSPITAL COURSE:   77 year old male with past medical history significant for Parkinson's disease, COPD not on home oxygen, CAD, sleep apnea, diabetes, chronic back pain comes to the hospital secondary to confusion and weakness.  1.  Acute metabolic encephalopathy-secondary to hypercapnia.  Patient not using the CPAP at home. -Improved with  short course of BiPAP in the hospital.  More alert and at baseline and wants to go home. -Received steroids in the hospital.  No indication for COPD exacerbation at this time. -Continue his inhalers, as needed nebulizers.  Discussed this with his primary physician at [facility] have agreed to follow-up on this -Also advised to cut down his narcotic pain medication, discontinued gabapentin  2.  Hypokalemia-replace  3.  Parkinson's disease-continue Sinemet and outpatient follow-up  4.  Paroxysmal atrial fibrillation-rate controlled.  On amiodarone.  Also takes Toprol.  On aspirin  5.  Diabetes mellitus-on Lantus and metformin.  Patient has a wheelchair and walker at home.  Will be discharged home   DISCHARGE CONDITIONS:   Guarded  CONSULTS OBTAINED:   None  DRUG ALLERGIES:   Allergies  Allergen Reactions  . Bupropion Other (See Comments)    Seizure  . Cheese     Patient doesn't want any cheese  . Sulfamethoxazole-Trimethoprim Other (See Comments)   DISCHARGE MEDICATIONS:   Allergies as of 09/23/2017      Reactions   Bupropion Other (See Comments)   Seizure   Cheese    Patient doesn't want any cheese   Sulfamethoxazole-trimethoprim Other (See Comments)      Medication List    STOP taking these medications   gabapentin 600 MG tablet Commonly known as:  NEURONTIN  TAKE these medications   ADVAIR DISKUS 500-50 MCG/DOSE Aepb Generic drug:  Fluticasone-Salmeterol Inhale 1 puff into the lungs 2 (two) times daily.   amiodarone 100 MG tablet Commonly known as:  PACERONE Take 100 mg by mouth daily.   aspirin EC 81 MG tablet Take 81 mg by mouth daily.   atorvastatin 80 MG tablet Commonly known as:  LIPITOR Take 80 mg by mouth at bedtime.   carbidopa-levodopa 25-250 MG tablet Commonly known as:  SINEMET IR Take 2 tablets by mouth every morning (8AM), 1 tablet at noon, 1 tablet every afternoon (430PM) and 1 tablet at bedtime (10PM)   cetirizine 10 MG  tablet Commonly known as:  ZYRTEC Take 10 mg by mouth at bedtime as needed for allergies.   citalopram 20 MG tablet Commonly known as:  CELEXA Take 20 mg by mouth daily.   COMBIGAN 0.2-0.5 % ophthalmic solution Generic drug:  brimonidine-timolol Place 1 drop into both eyes 2 (two) times daily.   ferrous sulfate 325 (65 FE) MG EC tablet Take 1 tablet (325 mg total) by mouth every Monday, Wednesday, and Friday.   fluticasone 50 MCG/ACT nasal spray Commonly known as:  FLONASE Place 2 sprays into both nostrils daily as needed for rhinitis.   GLUTOSE 15 PO Take 1 Tube by mouth. For blood sugar less than 70, repeat in 5 minutes up to 2 doses   HYDROcodone-acetaminophen 5-325 MG tablet Commonly known as:  NORCO/VICODIN Take 1 tablet by mouth daily as needed for moderate pain.   insulin glargine 100 UNIT/ML injection Commonly known as:  LANTUS Inject 14 Units into the skin every morning.   magnesium oxide 400 MG tablet Commonly known as:  MAG-OX Take 400 mg by mouth daily.   metFORMIN 1000 MG tablet Commonly known as:  GLUCOPHAGE Take 1,000 mg by mouth 2 (two) times daily with a meal.   metoprolol succinate 25 MG 24 hr tablet Commonly known as:  TOPROL-XL Take 25 mg by mouth daily.   nitroGLYCERIN 0.4 MG SL tablet Commonly known as:  NITROSTAT Place 0.4 mg under the tongue every 5 (five) minutes as needed for chest pain.   potassium chloride 20 MEQ packet Commonly known as:  KLOR-CON Take 40 mEq by mouth daily. What changed:  how much to take   pregabalin 50 MG capsule Commonly known as:  LYRICA Take 50 mg by mouth 2 (two) times daily.   REFRESH OPTIVE OP Apply 2 drops to eye every 8 (eight) hours as needed (dry eyes).   sennosides-docusate sodium 8.6-50 MG tablet Commonly known as:  SENOKOT-S Take 1 tablet by mouth daily.   tiotropium 18 MCG inhalation capsule Commonly known as:  SPIRIVA Place 18 mcg into inhaler and inhale daily. 2 PUFFS   traMADol 50 MG  tablet Commonly known as:  ULTRAM Take 50 mg by mouth every 6 (six) hours as needed for moderate pain.   traZODone 50 MG tablet Commonly known as:  DESYREL Take 50 mg by mouth at bedtime.   TUSSIN DM 10-100 MG/5ML liquid Generic drug:  Dextromethorphan-guaiFENesin Take 10 mLs by mouth every 6 (six) hours as needed (cough).   vitamin B-12 500 MCG tablet Commonly known as:  CYANOCOBALAMIN Take 1 tablet (500 mcg total) by mouth daily.   Vitamin D (Ergocalciferol) 50000 units Caps capsule Commonly known as:  DRISDOL Take 50,000 Units by mouth every 30 (thirty) days.        DISCHARGE INSTRUCTIONS:   1. PCP f/u in 1-2 weeks  DIET:   Cardiac diet  ACTIVITY:   Activity as tolerated  OXYGEN:   Home Oxygen: No.  Oxygen Delivery: room air  DISCHARGE LOCATION:   home   If you experience worsening of your admission symptoms, develop shortness of breath, life threatening emergency, suicidal or homicidal thoughts you must seek medical attention immediately by calling 911 or calling your MD immediately  if symptoms less severe.  You Must read complete instructions/literature along with all the possible adverse reactions/side effects for all the Medicines you take and that have been prescribed to you. Take any new Medicines after you have completely understood and accpet all the possible adverse reactions/side effects.   Please note  You were cared for by a hospitalist during your hospital stay. If you have any questions about your discharge medications or the care you received while you were in the hospital after you are discharged, you can call the unit and asked to speak with the hospitalist on call if the hospitalist that took care of you is not available. Once you are discharged, your primary care physician will handle any further medical issues. Please note that NO REFILLS for any discharge medications will be authorized once you are discharged, as it is imperative that you  return to your primary care physician (or establish a relationship with a primary care physician if you do not have one) for your aftercare needs so that they can reassess your need for medications and monitor your lab values.    On the day of Discharge:  VITAL SIGNS:   Blood pressure (!) 171/97, pulse 86, temperature 98 F (36.7 C), temperature source Oral, resp. rate 18, height 5\' 8"  (1.727 m), weight 90.7 kg (200 lb), SpO2 94 %.  PHYSICAL EXAMINATION:    GENERAL:  77 y.o.-year-old patient lying in the bed with no acute distress.  EYES: Pupils equal, round, reactive to light and accommodation. No scleral icterus. Extraocular muscles intact.  HEENT: Head atraumatic, normocephalic. Oropharynx and nasopharynx clear.  NECK:  Supple, no jugular venous distention. No thyroid enlargement, no tenderness.  LUNGS: Normal breath sounds bilaterally, no  rales,rhonchi or crepitation. No use of accessory muscles of respiration. Scattered wheezing CARDIOVASCULAR: S1, S2 normal. No  rubs, or gallops. 2/6 systolic murmur present ABDOMEN: Soft, non-tender, non-distended. Bowel sounds present. No organomegaly or mass.  EXTREMITIES: No pedal edema, cyanosis, or clubbing. Right hand tremors NEUROLOGIC: Cranial nerves II through XII are intact. Muscle strength 5/5 in all extremities. Sensation intact. Gait not checked.  PSYCHIATRIC: The patient is alert and oriented x 3.  SKIN: No obvious rash, lesion, or ulcer.   DATA REVIEW:   CBC Recent Labs  Lab 09/23/17 0415  WBC 6.1  HGB 11.1*  HCT 35.5*  PLT 186    Chemistries  Recent Labs  Lab 09/23/17 0415  NA 141  K 3.2*  CL 101  CO2 29  GLUCOSE 224*  BUN 12  CREATININE 0.84  CALCIUM 8.3*     Microbiology Results  Results for orders placed or performed during the hospital encounter of 04/30/17  MRSA PCR Screening     Status: None   Collection Time: 05/01/17 10:28 AM  Result Value Ref Range Status   MRSA by PCR NEGATIVE NEGATIVE Final     Comment:        The GeneXpert MRSA Assay (FDA approved for NASAL specimens only), is one component of a comprehensive MRSA colonization surveillance program. It is not intended to diagnose MRSA infection nor to guide  or monitor treatment for MRSA infections.   Culture, blood (x 2)     Status: None   Collection Time: 05/01/17 10:31 AM  Result Value Ref Range Status   Specimen Description BLOOD RIGHT HAND  Final   Special Requests   Final    BOTTLES DRAWN AEROBIC AND ANAEROBIC Blood Culture adequate volume   Culture NO GROWTH 5 DAYS  Final   Report Status 05/06/2017 FINAL  Final  Culture, blood (x 2)     Status: None   Collection Time: 05/01/17 10:33 AM  Result Value Ref Range Status   Specimen Description BLOOD LEFT HAND  Final   Special Requests   Final    BOTTLES DRAWN AEROBIC AND ANAEROBIC Blood Culture adequate volume   Culture NO GROWTH 5 DAYS  Final   Report Status 05/06/2017 FINAL  Final    RADIOLOGY:  No results found.   Management plans discussed with the patient, family and they are in agreement.  CODE STATUS:  Code Status History    Date Active Date Inactive Code Status Order ID Comments User Context   09/23/2017 0202 09/23/2017 1857 Full Code [id number]  Amelia Jo, MD Inpatient   04/30/2017 0834 05/06/2017 2029 Full Code [id number]  Harrie Foreman, MD Inpatient   07/04/2016 0028 07/04/2016 1846 Full Code [id number]  [place], [place], DO Inpatient   07/04/2016 0028 07/04/2016 0028 Full Code [id number]  Hugelmeyer, Alexis, DO Inpatient   03/03/2016 0013 03/04/2016 2200 Full Code [id number]  Saundra Shelling, MD Inpatient   12/28/2015 1718 12/30/2015 2008 Full Code [id number]  Henreitta Leber, MD Inpatient   12/03/2015 2059 12/04/2015 1530 Full Code [id number]  Thornton [place], MD ED   02/05/2015 1614 02/06/2015 1404 Full Code [id number]  Theodoro Grist, MD Inpatient   12/10/2014 0047 12/10/2014 1636 Full Code [id number]  Nicholes Mango, MD Inpatient      TOTAL TIME TAKING  CARE OF THIS PATIENT: 38 minutes.    Gladstone Lighter M.D on 09/24/2017 at 2:43 PM  Between 7am to 6pm - Pager - [phone]  After 6pm go to [email or web address] - [employer] [place] [facility]  Office  [phone]  CC: Primary care physician; [facility] [place]   Note: This dictation was prepared with Sales executive along with smaller Company secretary. Any transcriptional errors that result from this process are unintentional.

## 2017-10-13 ENCOUNTER — Encounter: Payer: Self-pay | Admitting: Emergency Medicine

## 2017-10-13 ENCOUNTER — Other Ambulatory Visit: Payer: Self-pay

## 2017-10-13 ENCOUNTER — Emergency Department: Payer: No Typology Code available for payment source

## 2017-10-13 ENCOUNTER — Inpatient Hospital Stay
Admission: EM | Admit: 2017-10-13 | Discharge: 2017-10-14 | DRG: 190 | Disposition: A | Discharge status: Home / Self Care | Payer: No Typology Code available for payment source | Attending: Internal Medicine | Admitting: Internal Medicine

## 2017-10-13 DIAGNOSIS — M1990 Unspecified osteoarthritis, unspecified site: Secondary | ICD-10-CM | POA: Diagnosis present

## 2017-10-13 DIAGNOSIS — Z8249 Family history of ischemic heart disease and other diseases of the circulatory system: Secondary | ICD-10-CM

## 2017-10-13 DIAGNOSIS — Z87891 Personal history of nicotine dependence: Secondary | ICD-10-CM

## 2017-10-13 DIAGNOSIS — E7800 Pure hypercholesterolemia, unspecified: Secondary | ICD-10-CM | POA: Diagnosis present

## 2017-10-13 DIAGNOSIS — M48061 Spinal stenosis, lumbar region without neurogenic claudication: Secondary | ICD-10-CM | POA: Diagnosis present

## 2017-10-13 DIAGNOSIS — Z791 Long term (current) use of non-steroidal anti-inflammatories (NSAID): Secondary | ICD-10-CM

## 2017-10-13 DIAGNOSIS — Z91011 Allergy to milk products: Secondary | ICD-10-CM

## 2017-10-13 DIAGNOSIS — H53039 Strabismic amblyopia, unspecified eye: Secondary | ICD-10-CM | POA: Diagnosis present

## 2017-10-13 DIAGNOSIS — J309 Allergic rhinitis, unspecified: Secondary | ICD-10-CM | POA: Diagnosis present

## 2017-10-13 DIAGNOSIS — H409 Unspecified glaucoma: Secondary | ICD-10-CM | POA: Diagnosis present

## 2017-10-13 DIAGNOSIS — E1122 Type 2 diabetes mellitus with diabetic chronic kidney disease: Secondary | ICD-10-CM | POA: Diagnosis present

## 2017-10-13 DIAGNOSIS — Z7982 Long term (current) use of aspirin: Secondary | ICD-10-CM

## 2017-10-13 DIAGNOSIS — I130 Hypertensive heart and chronic kidney disease with heart failure and stage 1 through stage 4 chronic kidney disease, or unspecified chronic kidney disease: Secondary | ICD-10-CM | POA: Diagnosis present

## 2017-10-13 DIAGNOSIS — J441 Chronic obstructive pulmonary disease with (acute) exacerbation: Secondary | ICD-10-CM | POA: Diagnosis not present

## 2017-10-13 DIAGNOSIS — I252 Old myocardial infarction: Secondary | ICD-10-CM

## 2017-10-13 DIAGNOSIS — R0902 Hypoxemia: Secondary | ICD-10-CM

## 2017-10-13 DIAGNOSIS — G20 Parkinson's disease: Secondary | ICD-10-CM | POA: Diagnosis present

## 2017-10-13 DIAGNOSIS — G4730 Sleep apnea, unspecified: Secondary | ICD-10-CM | POA: Diagnosis present

## 2017-10-13 DIAGNOSIS — N183 Chronic kidney disease, stage 3 (moderate): Secondary | ICD-10-CM | POA: Diagnosis present

## 2017-10-13 DIAGNOSIS — G904 Autonomic dysreflexia: Secondary | ICD-10-CM | POA: Diagnosis present

## 2017-10-13 DIAGNOSIS — I2510 Atherosclerotic heart disease of native coronary artery without angina pectoris: Secondary | ICD-10-CM | POA: Diagnosis present

## 2017-10-13 DIAGNOSIS — R1310 Dysphagia, unspecified: Secondary | ICD-10-CM | POA: Diagnosis present

## 2017-10-13 DIAGNOSIS — D509 Iron deficiency anemia, unspecified: Secondary | ICD-10-CM | POA: Diagnosis present

## 2017-10-13 DIAGNOSIS — Z9842 Cataract extraction status, left eye: Secondary | ICD-10-CM

## 2017-10-13 DIAGNOSIS — Z882 Allergy status to sulfonamides status: Secondary | ICD-10-CM

## 2017-10-13 DIAGNOSIS — K219 Gastro-esophageal reflux disease without esophagitis: Secondary | ICD-10-CM | POA: Diagnosis present

## 2017-10-13 DIAGNOSIS — E1151 Type 2 diabetes mellitus with diabetic peripheral angiopathy without gangrene: Secondary | ICD-10-CM | POA: Diagnosis present

## 2017-10-13 DIAGNOSIS — I509 Heart failure, unspecified: Secondary | ICD-10-CM | POA: Diagnosis present

## 2017-10-13 DIAGNOSIS — Z961 Presence of intraocular lens: Secondary | ICD-10-CM | POA: Diagnosis present

## 2017-10-13 DIAGNOSIS — Z888 Allergy status to other drugs, medicaments and biological substances status: Secondary | ICD-10-CM

## 2017-10-13 DIAGNOSIS — N179 Acute kidney failure, unspecified: Secondary | ICD-10-CM | POA: Diagnosis present

## 2017-10-13 DIAGNOSIS — Z794 Long term (current) use of insulin: Secondary | ICD-10-CM

## 2017-10-13 DIAGNOSIS — G2581 Restless legs syndrome: Secondary | ICD-10-CM | POA: Diagnosis present

## 2017-10-13 DIAGNOSIS — E1165 Type 2 diabetes mellitus with hyperglycemia: Secondary | ICD-10-CM | POA: Diagnosis present

## 2017-10-13 DIAGNOSIS — Z9981 Dependence on supplemental oxygen: Secondary | ICD-10-CM

## 2017-10-13 DIAGNOSIS — J9601 Acute respiratory failure with hypoxia: Secondary | ICD-10-CM | POA: Diagnosis present

## 2017-10-13 DIAGNOSIS — Z7951 Long term (current) use of inhaled steroids: Secondary | ICD-10-CM

## 2017-10-13 LAB — TROPONIN I: Troponin I: <0.03 ng/mL (ref <0.03)

## 2017-10-13 LAB — CBC
HEMATOCRIT: 33.5 % — AB (ref 40.0–52.0)
Hemoglobin: 10.2 g/dL — ABNORMAL LOW (ref 13.0–18.0)
MCH: 22.7 pg — ABNORMAL LOW (ref 26.0–34.0)
MCHC: 30.6 g/dL — AB (ref 32.0–36.0)
MCV: 74.1 fL — AB (ref 80.0–100.0)
PLATELETS: 269 10*3/uL (ref 150–440)
RBC: 4.52 MIL/uL (ref 4.40–5.90)
RDW: 20.2 % — ABNORMAL HIGH (ref 11.5–14.5)
WBC: 11.4 10*3/uL — AB (ref 3.8–10.6)

## 2017-10-13 LAB — BASIC METABOLIC PANEL
Anion gap: 10 (ref 5–15)
BUN: 15 mg/dL (ref 6–20)
CHLORIDE: 102 mmol/L (ref 101–111)
CO2: 27 mmol/L (ref 22–32)
CREATININE: 1.35 mg/dL — AB (ref 0.61–1.24)
Calcium: 8.5 mg/dL — ABNORMAL LOW (ref 8.9–10.3)
GFR calc Af Amer: 57 mL/min — ABNORMAL LOW (ref >60)
GFR calc non Af Amer: 49 mL/min — ABNORMAL LOW (ref >60)
GLUCOSE: 177 mg/dL — AB (ref 65–99)
Potassium: 4.1 mmol/L (ref 3.5–5.1)
SODIUM: 139 mmol/L (ref 135–145)

## 2017-10-13 MED ORDER — MAGNESIUM SULFATE 2 GM/50ML IV SOLN
2.0000 g | Freq: Once | INTRAVENOUS | Status: AC
  Administered 2017-10-13: 2 g via INTRAVENOUS
  Filled 2017-10-13: qty 50

## 2017-10-13 MED ORDER — IPRATROPIUM-ALBUTEROL 0.5-2.5 (3) MG/3ML IN SOLN
3.0000 mL | Freq: Once | RESPIRATORY_TRACT | Status: AC
  Administered 2017-10-13: 3 mL via RESPIRATORY_TRACT
  Filled 2017-10-13: qty 3

## 2017-10-13 NOTE — ED Triage Notes (Signed)
Patient coming from home via EMS for shortness of breath. Patient is normally on 2L [place] at home with Hx of COPD. Patient started feeling SOB at 8pm tonight and could not reach home breathing treatments so called 911. Patient was given 2 duonebs and 125 solu medrol by EMS. Patient has wheezing in all lung fields and has productive cough

## 2017-10-13 NOTE — ED Notes (Signed)
Patient's O2 sat dropped to 87% while sleeping with good wave form. Patient's O2 was at 3L via nasal cannula at the time. Increased patient to 5L O2 via nasal cannula. Patient in no respiratory distress at this time.

## 2017-10-14 ENCOUNTER — Encounter: Payer: Self-pay | Admitting: Internal Medicine

## 2017-10-14 DIAGNOSIS — G4730 Sleep apnea, unspecified: Secondary | ICD-10-CM | POA: Diagnosis present

## 2017-10-14 DIAGNOSIS — N183 Chronic kidney disease, stage 3 (moderate): Secondary | ICD-10-CM | POA: Diagnosis present

## 2017-10-14 DIAGNOSIS — H53039 Strabismic amblyopia, unspecified eye: Secondary | ICD-10-CM | POA: Diagnosis present

## 2017-10-14 DIAGNOSIS — K219 Gastro-esophageal reflux disease without esophagitis: Secondary | ICD-10-CM | POA: Diagnosis present

## 2017-10-14 DIAGNOSIS — I2510 Atherosclerotic heart disease of native coronary artery without angina pectoris: Secondary | ICD-10-CM | POA: Diagnosis present

## 2017-10-14 DIAGNOSIS — E1122 Type 2 diabetes mellitus with diabetic chronic kidney disease: Secondary | ICD-10-CM | POA: Diagnosis present

## 2017-10-14 DIAGNOSIS — G904 Autonomic dysreflexia: Secondary | ICD-10-CM | POA: Diagnosis present

## 2017-10-14 DIAGNOSIS — I252 Old myocardial infarction: Secondary | ICD-10-CM | POA: Diagnosis not present

## 2017-10-14 DIAGNOSIS — I130 Hypertensive heart and chronic kidney disease with heart failure and stage 1 through stage 4 chronic kidney disease, or unspecified chronic kidney disease: Secondary | ICD-10-CM | POA: Diagnosis present

## 2017-10-14 DIAGNOSIS — R0902 Hypoxemia: Secondary | ICD-10-CM | POA: Diagnosis present

## 2017-10-14 DIAGNOSIS — G20 Parkinson's disease: Secondary | ICD-10-CM | POA: Diagnosis present

## 2017-10-14 DIAGNOSIS — E1151 Type 2 diabetes mellitus with diabetic peripheral angiopathy without gangrene: Secondary | ICD-10-CM | POA: Diagnosis present

## 2017-10-14 DIAGNOSIS — Z9842 Cataract extraction status, left eye: Secondary | ICD-10-CM | POA: Diagnosis not present

## 2017-10-14 DIAGNOSIS — Z9981 Dependence on supplemental oxygen: Secondary | ICD-10-CM | POA: Diagnosis not present

## 2017-10-14 DIAGNOSIS — J441 Chronic obstructive pulmonary disease with (acute) exacerbation: Secondary | ICD-10-CM | POA: Diagnosis present

## 2017-10-14 DIAGNOSIS — J309 Allergic rhinitis, unspecified: Secondary | ICD-10-CM | POA: Diagnosis present

## 2017-10-14 DIAGNOSIS — H409 Unspecified glaucoma: Secondary | ICD-10-CM | POA: Diagnosis present

## 2017-10-14 DIAGNOSIS — R1310 Dysphagia, unspecified: Secondary | ICD-10-CM | POA: Diagnosis present

## 2017-10-14 DIAGNOSIS — I509 Heart failure, unspecified: Secondary | ICD-10-CM | POA: Diagnosis present

## 2017-10-14 DIAGNOSIS — N179 Acute kidney failure, unspecified: Secondary | ICD-10-CM | POA: Diagnosis present

## 2017-10-14 DIAGNOSIS — E1165 Type 2 diabetes mellitus with hyperglycemia: Secondary | ICD-10-CM | POA: Diagnosis present

## 2017-10-14 DIAGNOSIS — M1990 Unspecified osteoarthritis, unspecified site: Secondary | ICD-10-CM | POA: Diagnosis present

## 2017-10-14 DIAGNOSIS — E7800 Pure hypercholesterolemia, unspecified: Secondary | ICD-10-CM | POA: Diagnosis present

## 2017-10-14 DIAGNOSIS — J9601 Acute respiratory failure with hypoxia: Secondary | ICD-10-CM | POA: Diagnosis present

## 2017-10-14 DIAGNOSIS — G2581 Restless legs syndrome: Secondary | ICD-10-CM | POA: Diagnosis present

## 2017-10-14 LAB — BLOOD GAS, VENOUS
ACID-BASE EXCESS: 6.1 mmol/L — AB (ref 0.0–2.0)
Bicarbonate: 31.7 mmol/L — ABNORMAL HIGH (ref 20.0–28.0)
O2 SAT: 75.5 %
PATIENT TEMPERATURE: 37
PCO2 VEN: 50 mmHg (ref 44.0–60.0)
pH, Ven: 7.41 (ref 7.250–7.430)
pO2, Ven: 40 mmHg (ref 32.0–45.0)

## 2017-10-14 LAB — GLUCOSE, CAPILLARY
GLUCOSE-CAPILLARY: 266 mg/dL — AB (ref 65–99)
Glucose-Capillary: 264 mg/dL — ABNORMAL HIGH (ref 65–99)

## 2017-10-14 MED ORDER — CARBIDOPA-LEVODOPA 25-250 MG PO TABS
1.0000 | ORAL_TABLET | ORAL | Status: DC
  Filled 2017-10-14: qty 1

## 2017-10-14 MED ORDER — ONDANSETRON HCL 4 MG PO TABS: 4.0000 mg | ORAL_TABLET | Freq: Four times a day (QID) | ORAL | Status: DC | PRN

## 2017-10-14 MED ORDER — PREDNISONE 20 MG PO TABS: 40.0000 mg | ORAL_TABLET | Freq: Every day | ORAL | Status: DC

## 2017-10-14 MED ORDER — ALBUTEROL SULFATE (2.5 MG/3ML) 0.083% IN NEBU
5.0000 mg | INHALATION_SOLUTION | Freq: Once | RESPIRATORY_TRACT | Status: AC
  Administered 2017-10-14: 5 mg via RESPIRATORY_TRACT
  Filled 2017-10-14: qty 6

## 2017-10-14 MED ORDER — MAGNESIUM OXIDE 400 (241.3 MG) MG PO TABS
400.0000 mg | ORAL_TABLET | Freq: Every day | ORAL | Status: DC
  Administered 2017-10-14: 400 mg via ORAL
  Filled 2017-10-14: qty 1

## 2017-10-14 MED ORDER — FERROUS SULFATE 325 (65 FE) MG PO TABS
325.0000 mg | ORAL_TABLET | ORAL | Status: DC
  Administered 2017-10-14: 325 mg via ORAL
  Filled 2017-10-14: qty 1

## 2017-10-14 MED ORDER — LACTATED RINGERS IV SOLN
INTRAVENOUS | Status: AC
  Administered 2017-10-14 (×2): via INTRAVENOUS

## 2017-10-14 MED ORDER — PREDNISONE 10 MG PO TABS: 50.0000 mg | ORAL_TABLET | Freq: Every day | ORAL | 0 refills | Status: DC

## 2017-10-14 MED ORDER — INSULIN ASPART 100 UNIT/ML ~~LOC~~ SOLN: 0.0000 [IU] | Freq: Three times a day (TID) | SUBCUTANEOUS | Status: DC

## 2017-10-14 MED ORDER — INSULIN GLARGINE 100 UNIT/ML ~~LOC~~ SOLN
14.0000 [IU] | SUBCUTANEOUS | Status: DC
  Administered 2017-10-14: 14 [IU] via SUBCUTANEOUS
  Filled 2017-10-14: qty 0.14

## 2017-10-14 MED ORDER — TIMOLOL MALEATE 0.5 % OP SOLN
1.0000 [drp] | Freq: Two times a day (BID) | OPHTHALMIC | Status: DC
  Administered 2017-10-14: 1 [drp] via OPHTHALMIC
  Filled 2017-10-14: qty 5

## 2017-10-14 MED ORDER — BRIMONIDINE TARTRATE 0.2 % OP SOLN
1.0000 [drp] | Freq: Two times a day (BID) | OPHTHALMIC | Status: DC
  Administered 2017-10-14: 1 [drp] via OPHTHALMIC
  Filled 2017-10-14: qty 5

## 2017-10-14 MED ORDER — ACETAMINOPHEN 325 MG PO TABS: 650.0000 mg | ORAL_TABLET | Freq: Four times a day (QID) | ORAL | Status: DC | PRN

## 2017-10-14 MED ORDER — INSULIN ASPART 100 UNIT/ML ~~LOC~~ SOLN: 0.0000 [IU] | Freq: Every day | SUBCUTANEOUS | Status: DC

## 2017-10-14 MED ORDER — HYDROCODONE-ACETAMINOPHEN 5-325 MG PO TABS: 1.0000 | ORAL_TABLET | Freq: Every day | ORAL | Status: DC | PRN

## 2017-10-14 MED ORDER — SODIUM CHLORIDE 0.9 % IV SOLN
500.0000 mg | Freq: Once | INTRAVENOUS | Status: AC
  Administered 2017-10-14: 500 mg via INTRAVENOUS
  Filled 2017-10-14: qty 500

## 2017-10-14 MED ORDER — PREGABALIN 50 MG PO CAPS
50.0000 mg | ORAL_CAPSULE | Freq: Two times a day (BID) | ORAL | Status: DC
  Administered 2017-10-14: 50 mg via ORAL
  Filled 2017-10-14: qty 1

## 2017-10-14 MED ORDER — NITROGLYCERIN 0.4 MG SL SUBL: 0.4000 mg | SUBLINGUAL_TABLET | SUBLINGUAL | Status: DC | PRN

## 2017-10-14 MED ORDER — AZITHROMYCIN 250 MG PO TABS: 250.0000 mg | ORAL_TABLET | Freq: Every day | ORAL | Status: DC

## 2017-10-14 MED ORDER — CARBIDOPA-LEVODOPA 25-250 MG PO TABS
1.0000 | ORAL_TABLET | ORAL | Status: DC
  Administered 2017-10-14: 1 via ORAL
  Filled 2017-10-14: qty 1

## 2017-10-14 MED ORDER — ACETAMINOPHEN 650 MG RE SUPP: 650.0000 mg | Freq: Four times a day (QID) | RECTAL | Status: DC | PRN

## 2017-10-14 MED ORDER — SENNOSIDES-DOCUSATE SODIUM 8.6-50 MG PO TABS: 1.0000 | ORAL_TABLET | Freq: Every evening | ORAL | Status: DC | PRN

## 2017-10-14 MED ORDER — POTASSIUM CHLORIDE 20 MEQ PO PACK
40.0000 meq | PACK | Freq: Every day | ORAL | Status: DC
  Administered 2017-10-14: 40 meq via ORAL
  Filled 2017-10-14: qty 2

## 2017-10-14 MED ORDER — ENOXAPARIN SODIUM 40 MG/0.4ML ~~LOC~~ SOLN: 40.0000 mg | SUBCUTANEOUS | Status: DC

## 2017-10-14 MED ORDER — BISACODYL 5 MG PO TBEC: 5.0000 mg | DELAYED_RELEASE_TABLET | Freq: Every day | ORAL | Status: DC | PRN

## 2017-10-14 MED ORDER — METHYLPREDNISOLONE SODIUM SUCC 125 MG IJ SOLR
60.0000 mg | Freq: Two times a day (BID) | INTRAMUSCULAR | Status: DC
  Administered 2017-10-14: 60 mg via INTRAVENOUS
  Filled 2017-10-14: qty 2

## 2017-10-14 MED ORDER — ASPIRIN EC 81 MG PO TBEC
81.0000 mg | DELAYED_RELEASE_TABLET | Freq: Every day | ORAL | Status: DC
  Administered 2017-10-14: 81 mg via ORAL
  Filled 2017-10-14: qty 1

## 2017-10-14 MED ORDER — IPRATROPIUM-ALBUTEROL 0.5-2.5 (3) MG/3ML IN SOLN
3.0000 mL | Freq: Four times a day (QID) | RESPIRATORY_TRACT | Status: DC
  Administered 2017-10-14: 3 mL via RESPIRATORY_TRACT
  Filled 2017-10-14: qty 3

## 2017-10-14 MED ORDER — AMIODARONE HCL 200 MG PO TABS
100.0000 mg | ORAL_TABLET | Freq: Every day | ORAL | Status: DC
  Administered 2017-10-14: 100 mg via ORAL
  Filled 2017-10-14: qty 1

## 2017-10-14 MED ORDER — CARBIDOPA-LEVODOPA 25-250 MG PO TABS
2.0000 | ORAL_TABLET | ORAL | Status: DC
  Administered 2017-10-14: 2 via ORAL
  Filled 2017-10-14: qty 2

## 2017-10-14 MED ORDER — CARBOXYMETHYLCELLUL-GLYCERIN 0.5-0.9 % OP SOLN: 2.0000 [drp] | Freq: Three times a day (TID) | OPHTHALMIC | Status: DC | PRN

## 2017-10-14 MED ORDER — TRAZODONE HCL 50 MG PO TABS: 50.0000 mg | ORAL_TABLET | Freq: Every day | ORAL | Status: DC

## 2017-10-14 MED ORDER — METOPROLOL SUCCINATE ER 50 MG PO TB24
25.0000 mg | ORAL_TABLET | Freq: Every day | ORAL | Status: DC
  Administered 2017-10-14: 25 mg via ORAL
  Filled 2017-10-14: qty 1

## 2017-10-14 MED ORDER — CITALOPRAM HYDROBROMIDE 20 MG PO TABS
20.0000 mg | ORAL_TABLET | Freq: Every day | ORAL | Status: DC
  Administered 2017-10-14: 20 mg via ORAL
  Filled 2017-10-14: qty 1

## 2017-10-14 MED ORDER — TRAMADOL HCL 50 MG PO TABS: 50.0000 mg | ORAL_TABLET | Freq: Four times a day (QID) | ORAL | Status: DC | PRN

## 2017-10-14 MED ORDER — ATORVASTATIN CALCIUM 20 MG PO TABS: 80.0000 mg | ORAL_TABLET | Freq: Every day | ORAL | Status: DC

## 2017-10-14 MED ORDER — BRIMONIDINE TARTRATE-TIMOLOL 0.2-0.5 % OP SOLN: 1.0000 [drp] | Freq: Two times a day (BID) | OPHTHALMIC | Status: DC

## 2017-10-14 MED ORDER — ONDANSETRON HCL 4 MG/2ML IJ SOLN: 4.0000 mg | Freq: Four times a day (QID) | INTRAMUSCULAR | Status: DC | PRN

## 2017-10-14 MED ORDER — INSULIN ASPART 100 UNIT/ML ~~LOC~~ SOLN
0.0000 [IU] | Freq: Three times a day (TID) | SUBCUTANEOUS | Status: DC
  Administered 2017-10-14 (×2): 8 [IU] via SUBCUTANEOUS
  Filled 2017-10-14 (×2): qty 1

## 2017-10-14 MED ORDER — AZITHROMYCIN 250 MG PO TABS: ORAL_TABLET | ORAL | 0 refills | Status: DC

## 2017-10-14 MED ORDER — POLYVINYL ALCOHOL 1.4 % OP SOLN
1.0000 [drp] | Freq: Three times a day (TID) | OPHTHALMIC | Status: DC | PRN
  Filled 2017-10-14: qty 15

## 2017-10-14 MED ORDER — SODIUM CHLORIDE 0.9 % IV SOLN: 500.0000 mg | INTRAVENOUS | Status: DC

## 2017-10-14 MED ORDER — IPRATROPIUM-ALBUTEROL 0.5-2.5 (3) MG/3ML IN SOLN
3.0000 mL | RESPIRATORY_TRACT | Status: DC
Start: 2017-10-14 — End: 2017-10-14
  Administered 2017-10-14 (×2): 3 mL via RESPIRATORY_TRACT
  Filled 2017-10-14 (×2): qty 3

## 2017-10-14 MED ORDER — VITAMIN B-12 1000 MCG PO TABS
500.0000 ug | ORAL_TABLET | Freq: Every day | ORAL | Status: DC
  Administered 2017-10-14: 500 ug via ORAL
  Filled 2017-10-14: qty 1

## 2017-10-14 MED ORDER — MOMETASONE FURO-FORMOTEROL FUM 200-5 MCG/ACT IN AERO
2.0000 | INHALATION_SPRAY | Freq: Two times a day (BID) | RESPIRATORY_TRACT | Status: DC
  Administered 2017-10-14: 2 via RESPIRATORY_TRACT
  Filled 2017-10-14: qty 8.8

## 2017-10-14 NOTE — Progress Notes (Signed)
PT Cancellation Note  Patient Details Name: Jimmy Hill. MRN: [id number] DOB: 05/09/1941   Cancelled Treatment:    Reason Eval/Treat Not Completed: Other (comment).  This PT reported to pt's room but pt had already been discharged.    Collie Siad PT, DPT 10/14/2017, 3:36 PM

## 2017-10-14 NOTE — Progress Notes (Signed)
Pt has been on chronic 2L since about 1030 this am with no complications. Pt got up to chair with assistance of RN. Pt stable for d/c home today per MD. Discharge instructions and prescriptions reviewed with pt, all questions answered. PIV removed, VSS 94% on 2L [place]. [facility] will be transporting pt and he will be going to PT before going home per Robin (with [facility]).  [place], Jerry Caras

## 2017-10-14 NOTE — H&P (Signed)
[facility] NAME: Jimmy Hill    MR#:  [id number]  DATE OF BIRTH:  [birth year]  DATE OF ADMISSION:  10/13/2017  PRIMARY CARE PHYSICIAN: [facility] [place]   REQUESTING/REFERRING PHYSICIAN: Loney Hering, MD  CHIEF COMPLAINT:   Chief Complaint  Patient presents with  . Shortness of Breath    HISTORY OF PRESENT ILLNESS:  Jimmy Hill  is a 77 y.o. male with a known history of COPD p/w cough/SOB x1d. Poor historian, doesn't say much. Endorses onset of SOB @~2100PM on Tuesday 10/13/2017. Endorses cough productive of white sputum. Endorses mild subjective fever. Endorses fatigue/malaise/generalized weakness/decreased exercise tolerance. Denies chills, N/V/D/AP, CP, hemoptysis. (+) wheezing on exam. Sputum thick and tenacious. Labs demonstrate hyperglycemia, AKI, microcytic anemia. Admit for moderate COPD exacerbation.  PAST MEDICAL HISTORY:   Past Medical History:  Diagnosis Date  . Allergic rhinitis   . Anginal pain ([place])   . Arthritis   . Autonomic dysreflexia   . Back pain    CHRONIC LBP,STENOSIS  . CHF (congestive heart failure) ([place])   . Chronic kidney disease    STAGE 3  . COPD (chronic obstructive pulmonary disease) ([place])   . Coronary artery disease   . Depression   . Diabetes ([place])   . Diabetes mellitus without complication ([place])   . DJD (degenerative joint disease)   . Dysphagia   . Dysrhythmia    A-fib, now converted  . Edema    FEET/ANKLES  . GERD (gastroesophageal reflux disease)   . Glaucoma   . H/O orthostatic hypotension   . Hypercholesterolemia   . Myocardial infarction ([place]) 2000  . Parkinson's disease ([place])   . Peripheral vascular disease ([place])   . Pneumonia    history of   . Polyneuropathy   . RLS (restless legs syndrome)   . Seizures ([place])    when in rehab for his hip  . Sleep apnea   . Smoker   . Spinal stenosis   . Strabismic amblyopia   . Tinnitus       PAST SURGICAL HISTORY:   Past Surgical History:  Procedure Laterality Date  . ANKLE SURGERY    . APPENDECTOMY    . BACK SURGERY    . CATARACT EXTRACTION W/PHACO Left 08/26/2016   Procedure: CATARACT EXTRACTION PHACO AND INTRAOCULAR LENS PLACEMENT (IOC);  Surgeon: Birder Robson, MD;  Location: [facility] ORS;  Service: Ophthalmology;  Laterality: Left;  [place] 01:37 AP% 19.4 CDE 18.97 fluid pack lot # [id number]  . CORONARY ANGIOPLASTY     STENT  . CTR    . ESOPHAGOGASTRODUODENOSCOPY (EGD) WITH PROPOFOL N/A 03/04/2016   Procedure: ESOPHAGOGASTRODUODENOSCOPY (EGD) WITH PROPOFOL;  Surgeon: [name], MD;  Location: [facility] END[facility];  Service: Endoscopy;  Laterality: N/A;  . EYE SURGERY     cataract  . FRACTURE SURGERY    . HIP PINNING,CANNULATED Left 12/04/2015   Procedure: CANNULATED HIP PINNING;  Surgeon: Thornton Park, MD;  Location: [facility] ORS;  Service: Orthopedics;  Laterality: Left;    SOCIAL HISTORY:   Social History   Tobacco Use  . Smoking status: Former Smoker    Packs/day: 0.50    Types: Cigarettes    Last attempt to quit: 11/17/2015    Years since quitting: 1.9  . Smokeless tobacco: Never Used  Substance Use Topics  . Alcohol use: No    FAMILY HISTORY:   Family History  Problem Relation Age of Onset  .  Heart disease Father   . Diabetes Mellitus II Other     DRUG ALLERGIES:   Allergies  Allergen Reactions  . Bupropion Other (See Comments)    Seizure  . Cheese     Patient doesn't want any cheese  . Sulfamethoxazole-Trimethoprim Other (See Comments)    REVIEW OF SYSTEMS:   Review of Systems  Constitutional: Positive for fever and malaise/fatigue. Negative for chills, diaphoresis and weight loss.  HENT: Negative for congestion, ear pain, hearing loss, nosebleeds, sinus pain, sore throat and tinnitus.   Eyes: Negative for blurred vision, double vision and photophobia.  Respiratory: Positive for cough, sputum production, shortness of breath and wheezing.  Negative for hemoptysis.   Cardiovascular: Negative for chest pain, palpitations, orthopnea, claudication, leg swelling and PND.  Gastrointestinal: Negative for abdominal pain, blood in stool, constipation, diarrhea, heartburn, melena, nausea and vomiting.  Genitourinary: Negative for dysuria, frequency, hematuria and urgency.  Musculoskeletal: Negative for back pain, falls, joint pain, myalgias and neck pain.  Skin: Negative for itching and rash.  Neurological: Positive for weakness. Negative for dizziness, tingling, tremors, sensory change, speech change, focal weakness, seizures, loss of consciousness and headaches.  Psychiatric/Behavioral: Negative for memory loss. The patient does not have insomnia.    MEDICATIONS AT HOME:   Prior to Admission medications   Medication Sig Start Date End Date Taking? Authorizing Provider  amiodarone (PACERONE) 100 MG tablet Take 100 mg by mouth daily.    [provider]  aspirin EC 81 MG tablet Take 81 mg by mouth daily.    [provider]  atorvastatin (LIPITOR) 80 MG tablet Take 80 mg by mouth at bedtime.    [provider]  brimonidine-timolol (COMBIGAN) 0.2-0.5 % ophthalmic solution Place 1 drop into both eyes 2 (two) times daily.    [provider]  carbidopa-levodopa (SINEMET IR) 25-250 MG tablet Take 2 tablets by mouth every morning (8AM), 1 tablet at noon, 1 tablet every afternoon (430PM) and 1 tablet at bedtime (10PM)    [provider]  Carboxymethylcellul-Glycerin (REFRESH OPTIVE OP) Apply 2 drops to eye every 8 (eight) hours as needed (dry eyes).    [provider]  cetirizine (ZYRTEC) 10 MG tablet Take 10 mg by mouth at bedtime as needed for allergies.     [provider]  citalopram (CELEXA) 20 MG tablet Take 20 mg by mouth daily.    [provider]  Dextromethorphan-Guaifenesin (TUSSIN DM) 10-100 MG/5ML liquid Take 10 mLs by mouth every 6 (six) hours as needed (cough).     [provider]  Dextrose, Diabetic Use, (GLUTOSE 15 PO) Take 1 Tube by mouth. For blood sugar less than 70, repeat in 5 minutes up to 2 doses    [provider]  ferrous sulfate 325 (65 FE) MG EC tablet Take 1 tablet (325 mg total) by mouth every Monday, Wednesday, and Friday. 09/23/17 11/22/17  Gladstone Lighter, MD  fluticasone (FLONASE) 50 MCG/ACT nasal spray Place 2 sprays into both nostrils daily as needed for rhinitis.    [provider]  Fluticasone-Salmeterol (ADVAIR DISKUS) 500-50 MCG/DOSE AEPB Inhale 1 puff into the lungs 2 (two) times daily.     [provider]  HYDROcodone-acetaminophen (NORCO/VICODIN) 5-325 MG tablet Take 1 tablet by mouth daily as needed for moderate pain.     [provider]  insulin glargine (LANTUS) 100 UNIT/ML injection Inject 14 Units into the skin every morning.     Gareth Morgan, MD  magnesium oxide (MAG-OX) 400  MG tablet Take 400 mg by mouth daily.    [provider]  metFORMIN (GLUCOPHAGE) 1000 MG tablet Take 1,000 mg by mouth 2 (two) times daily with a meal.     Gareth Morgan, MD  metoprolol succinate (TOPROL-XL) 25 MG 24 hr tablet Take 25 mg by mouth daily.    [provider]  nitroGLYCERIN (NITROSTAT) 0.4 MG SL tablet Place 0.4 mg under the tongue every 5 (five) minutes as needed for chest pain.    [provider]  potassium chloride (KLOR-CON) 20 MEQ packet Take 40 mEq by mouth daily. 09/23/17 10/23/17  Gladstone Lighter, MD  pregabalin (LYRICA) 50 MG capsule Take 50 mg by mouth 2 (two) times daily. 01/26/17 01/26/18  [provider]  sennosides-docusate sodium (SENOKOT-S) 8.6-50 MG tablet Take 1 tablet by mouth daily.     [provider]  tiotropium (SPIRIVA) 18 MCG inhalation capsule Place 18 mcg into inhaler and inhale daily. 2 PUFFS    [provider]  traMADol (ULTRAM) 50 MG tablet Take 50 mg by mouth every 6 (six) hours as needed for moderate pain.     [provider]  traZODone (DESYREL) 50 MG tablet Take 50 mg by mouth at bedtime.    [provider]  vitamin B-12 500 MCG tablet Take 1 tablet (500 mcg total) by mouth daily. 05/06/17   Henreitta Leber, MD  Vitamin D, Ergocalciferol, (DRISDOL) 50000 units CAPS capsule Take 50,000 Units by mouth every 30 (thirty) days.    [provider]      VITAL SIGNS:  Blood pressure (!) 151/84, pulse 87, temperature 98.6 F (37 C), temperature source Oral, resp. rate 17, height 5\' 8"  (1.727 m), weight 90.7 kg (200 lb), SpO2 92 %.  PHYSICAL EXAMINATION:  Physical Exam  Constitutional: He is oriented to person, place, and time. He appears well-developed and well-nourished. He is active and cooperative.  Non-toxic appearance. He does not have a sickly appearance. He does not appear ill. No distress. He is not intubated.  HENT:  Head: Normocephalic and atraumatic.  Mouth/Throat: Oropharynx is clear and moist. No oropharyngeal exudate or posterior oropharyngeal edema.  Eyes: Conjunctivae, EOM and lids are normal. No scleral icterus.  Neck: Neck supple. No JVD present. No thyromegaly present.  Cardiovascular: Normal rate, regular rhythm, S1 normal, S2 normal and normal heart sounds.  No extrasystoles are present. Exam reveals no gallop, no S3, no S4, no distant heart sounds and no friction rub.  No murmur heard. Pulmonary/Chest: Effort normal. No accessory muscle usage or stridor. Tachypnea noted. No apnea and no bradypnea. He is not intubated. No respiratory distress. He has decreased breath sounds in the right upper field, the right middle field, the right lower field, the left upper field, the left middle field and the left lower field. He has wheezes in the right upper field, the right middle field, the right lower field, the left upper field, the left middle field and the left lower field. He has no rhonchi. He has no rales.  Abdominal: Soft. Bowel sounds are normal. He exhibits  no distension. There is no tenderness. There is no rebound and no guarding.  Musculoskeletal: Normal range of motion. He exhibits no edema or tenderness.       Right lower leg: Normal. He exhibits no tenderness and no edema.       Left lower leg: Normal. He exhibits no tenderness and no edema.  Lymphadenopathy:    He has no cervical adenopathy.  Neurological: He is alert and oriented to person, place, and time.  Skin: Skin is warm and dry. No rash noted. He is not diaphoretic. No erythema.  Psychiatric: Judgment and thought content normal. His mood appears not anxious. His affect is blunt. He is withdrawn. He is not agitated.   (+) mild diffuse wheezing. Not in distress. LABORATORY PANEL:   CBC Recent Labs  Lab 10/13/17 2237  WBC 11.4*  HGB 10.2*  HCT 33.5*  PLT 269   ------------------------------------------------------------------------------------------------------------------  Chemistries  Recent Labs  Lab 10/13/17 2237  NA 139  K 4.1  CL 102  CO2 27  GLUCOSE 177*  BUN 15  CREATININE 1.35*  CALCIUM 8.5*   ------------------------------------------------------------------------------------------------------------------  Cardiac Enzymes Recent Labs  Lab 10/13/17 2237  TROPONINI <0.03   ------------------------------------------------------------------------------------------------------------------  RADIOLOGY:  Dg Chest Portable 1 View  Result Date: 10/13/2017 CLINICAL DATA:  Shortness of breath EXAM: PORTABLE CHEST 1 VIEW COMPARISON:  Sep 22, 2017 FINDINGS: Stable cardiomegaly. The hila and mediastinum are normal. No pneumothorax. No pulmonary nodules, masses, focal infiltrates, or overt edema. IMPRESSION: No active disease. Electronically Signed   By: Dorise Bullion III M.D   On: 10/13/2017 23:04   IMPRESSION AND PLAN:   A/P: 36I acute COPD exacerbation, acute hypoxemic respiratory failure, T2IDDM/hyperglycemia, AKI, microcytic anemia. -Afebrile, WBC < 12,  SIRS (-) -CXR (-) pneumonia -DuoNebs -IV steroids, transition to PO -Azithromycin indicated in moderate exacerbation even in absence of active bacterial infxn/pna -Incentive spirometry, pulmonary toileting -SSI, c/w insulin, hold PO antihyperglycemics -Cr 1.35, increased from 0.84 on 09/23/2017. Suspect prerenal AKI superimposed on CKD III (2/2 DM, HTN, aged kidney), IVF, monitor BMP, avoid nephrotoxins. Consider urine studies (electrolytes, protein, creatinine, urea nitrogen), renal U/S if no improvement -Microcytic anemia, suspect iron deficiency, also likely component of anemia of chronic disease. No evidence of acute blood loss -c/w remaining home meds, formulary subs -Cardiac diabetic diet -Lovenox -Full code -Admission, > 2 midnights   All the records are reviewed and case discussed with ED provider. Management plans discussed with the patient, family and they are in agreement.  CODE STATUS: Full code  TOTAL TIME TAKING CARE OF THIS PATIENT: 75 minutes.    Arta Silence M.D on 10/14/2017 at 3:27 AM  Between 7am to 6pm - Pager - [phone]  After 6pm go to [email or web address] - [employer] [facility]  Office  [phone]  CC: Primary care physician; [facility] [place]   Note: This dictation was prepared with Sales executive along with smaller Company secretary. Any transcriptional errors that result from this process are unintentional.

## 2017-10-14 NOTE — ED Notes (Signed)
Patient transported to 157 

## 2017-10-14 NOTE — ED Provider Notes (Signed)
[facility] Emergency Department Provider Note   ____________________________________________   First MD Initiated Contact with Patient 10/13/17 2316     (approximate)  I have reviewed the triage vital signs and the nursing notes.   HISTORY  Chief Complaint Shortness of Breath    HPI Jimmy Brahm. is a 77 y.o. male who comes into the hospital today with some shortness of breath.  The patient was sneezing coughing and could not catch his breath.  The symptoms started around 8 PM this evening.  He has been on intermittent O2 for about 2 weeks and then was changed to 2 L of continuous O2.  He denies any chest pain but has had a cough productive of white sputum.  He denies any nausea or vomiting.  He was seen in the clinic today but he did have a 7-minute coughing fit.  The physician states that he encouraged the patient to use his albuterol at home but the patient states he could not even get up to go get it.  EMS were called and the patient received a breathing treatment but he was sent in for further evaluation.  By EMS the patient received 2 DuoNeb's and a dose of Solu-Medrol 125 mg.   Past Medical History:  Diagnosis Date  . Allergic rhinitis   . Anginal pain ([place])   . Arthritis   . Autonomic dysreflexia   . Back pain    CHRONIC LBP,STENOSIS  . CHF (congestive heart failure) ([place])   . Chronic kidney disease    STAGE 3  . COPD (chronic obstructive pulmonary disease) ([place])   . Coronary artery disease   . Depression   . Diabetes ([place])   . Diabetes mellitus without complication ([place])   . DJD (degenerative joint disease)   . Dysphagia   . Dysrhythmia    A-fib, now converted  . Edema    FEET/ANKLES  . GERD (gastroesophageal reflux disease)   . Glaucoma   . H/O orthostatic hypotension   . Hypercholesterolemia   . Myocardial infarction ([place]) 2000  . Parkinson's disease ([place])   . Peripheral vascular disease ([place])   . Pneumonia    history of    . Polyneuropathy   . RLS (restless legs syndrome)   . Seizures ([place])    when in rehab for his hip  . Sleep apnea   . Smoker   . Spinal stenosis   . Strabismic amblyopia   . Tinnitus     Patient Active Problem List   Diagnosis Date Noted  . Acute exacerbation of chronic obstructive pulmonary disease (COPD) ([place]) 09/23/2017  . Sepsis ([place]) 05/01/2017  . Syncope 04/30/2017  . New onset atrial fibrillation ([place]) 07/03/2016  . Loss of weight   . Gastritis without bleeding   . Occult GI bleeding   . GI bleed 03/02/2016  . Left-sided weakness 03/02/2016  . Rectal bleeding 01/16/2016  . Colitis presumed infectious 01/10/2016  . Pressure ulcer 12/29/2015  . Altered mental status 12/28/2015  . Fracture of femoral neck, left ([place]) 12/03/2015  . CAD (coronary artery disease) 12/03/2015  . Sleep apnea 12/03/2015  . Femoral neck fracture, left, closed, initial encounter 12/03/2015  . Unstable angina ([place]) 05/14/2015  . COPD (chronic obstructive pulmonary disease) ([place]) 02/05/2015  . Acute bronchitis 02/05/2015  . Type 2 diabetes mellitus ([place]) 02/05/2015  . Chest pain 12/09/2014  . Presence of drug coated stent in LAD coronary artery 07/11/2013  . Disease characterized by destruction of  skeletal muscle 09/12/2009  . Encephalopathy 09/12/2009  . Vitamin D deficiency 05/05/2008  . Polyneuropathy in diabetes ([place]) 08/17/2005  . Spinal stenosis in cervical region 03/20/2005  . Essential tremor 01/17/2005  . Hypercholesterolemia 03/21/2004  . Hypertension, benign 01/08/2004  . 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 Localized osteoarthrosis, lower leg 04/07/2002  . Parkinson's disease ([place]) 09/21/2000  . Peptic ulcer with hemorrhage and perforation ([place]) 04/19/1996  . 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 ANKLE SURGERY    . APPENDECTOMY    . BACK SURGERY    . CATARACT EXTRACTION W/PHACO Left 08/26/2016    Procedure: CATARACT EXTRACTION PHACO AND INTRAOCULAR LENS PLACEMENT (IOC);  Surgeon: Birder Robson, MD;  Location: [facility] ORS;  Service: Ophthalmology;  Laterality: Left;  [place] 01:37 AP% 19.4 CDE 18.97 fluid pack lot # [id number]  . CORONARY ANGIOPLASTY     STENT  . CTR    . ESOPHAGOGASTRODUODENOSCOPY (EGD) WITH PROPOFOL N/A 03/04/2016   Procedure: ESOPHAGOGASTRODUODENOSCOPY (EGD) WITH PROPOFOL;  Surgeon: [name], MD;  Location: [facility] ENDOSCOPY;  Service: Endoscopy;  Laterality: N/A;  . EYE SURGERY     cataract  . FRACTURE SURGERY    . HIP PINNING,CANNULATED Left 12/04/2015   Procedure: CANNULATED HIP PINNING;  Surgeon: Thornton Park, MD;  Location: [facility] ORS;  Service: Orthopedics;  Laterality: Left;    Prior to Admission medications   Medication Sig Start Date End Date Taking? Authorizing Provider  amiodarone (PACERONE) 100 MG tablet Take 100 mg by mouth daily.    [provider]  aspirin EC 81 MG tablet Take 81 mg by mouth daily.    [provider]  atorvastatin (LIPITOR) 80 MG tablet Take 80 mg by mouth at bedtime.    [provider]  brimonidine-timolol (COMBIGAN) 0.2-0.5 % ophthalmic solution Place 1 drop into both eyes 2 (two) times daily.    [provider]  carbidopa-levodopa (SINEMET IR) 25-250 MG tablet Take 2 tablets by mouth every morning (8AM), 1 tablet at noon, 1 tablet every afternoon (430PM) and 1 tablet at bedtime (10PM)    [provider]  Carboxymethylcellul-Glycerin (REFRESH OPTIVE OP) Apply 2 drops to eye every 8 (eight) hours as needed (dry eyes).    [provider]  cetirizine (ZYRTEC) 10 MG tablet Take 10 mg by mouth at bedtime as needed for allergies.     [provider]  citalopram (CELEXA) 20 MG tablet Take 20 mg by mouth daily.    [provider]  Dextromethorphan-Guaifenesin (TUSSIN DM) 10-100 MG/5ML liquid Take 10 mLs by mouth every 6 (six) hours as needed (cough).    [provider]  Dextrose, Diabetic Use, (GLUTOSE 15 PO) Take 1 Tube by mouth. For blood sugar less than 70, repeat in 5 minutes up to 2 doses    [provider]  ferrous sulfate 325 (65 FE) MG EC tablet Take 1 tablet (325 mg total) by mouth every Monday, Wednesday, and Friday. 09/23/17 11/22/17  [place] Lighter, MD  fluticasone (FLONASE) 50 MCG/ACT nasal spray Place 2 sprays into both nostrils daily as needed for rhinitis.    [provider]  Fluticasone-Salmeterol (ADVAIR DISKUS) 500-50 MCG/DOSE AEPB Inhale 1 puff into the lungs 2 (two) times daily.     [provider]  HYDROcodone-acetaminophen (NORCO/VICODIN) 5-325 MG tablet Take 1 tablet by mouth daily as needed for moderate pain.     [provider]  insulin glargine (LANTUS) 100 UNIT/ML injection  Inject 14 Units into the skin every morning.     Gareth Morgan, MD  magnesium oxide (MAG-OX) 400 MG tablet Take 400 mg by mouth daily.    [provider]  metFORMIN (GLUCOPHAGE) 1000 MG tablet Take 1,000 mg by mouth 2 (two) times daily with a meal.     Gareth Morgan, MD  metoprolol succinate (TOPROL-XL) 25 MG 24 hr tablet Take 25 mg by mouth daily.    [provider]  nitroGLYCERIN (NITROSTAT) 0.4 MG SL tablet Place 0.4 mg under the tongue every 5 (five) minutes as needed for chest pain.    [provider]  potassium chloride (KLOR-CON) 20 MEQ packet Take 40 mEq by mouth daily. 09/23/17 10/23/17  [place] Lighter, MD  pregabalin (LYRICA) 50 MG capsule Take 50 mg by mouth 2 (two) times daily. 01/26/17 01/26/18  [provider]  sennosides-docusate sodium (SENOKOT-S) 8.6-50 MG tablet Take 1 tablet by mouth daily.     [provider]  tiotropium (SPIRIVA) 18 MCG inhalation capsule Place 18 mcg into inhaler and inhale daily. 2 PUFFS    [provider]  traMADol (ULTRAM) 50 MG tablet Take 50 mg by mouth every 6 (six) hours as needed for moderate pain.    [provider]    traZODone (DESYREL) 50 MG tablet Take 50 mg by mouth at bedtime.    [provider]  vitamin B-12 500 MCG tablet Take 1 tablet (500 mcg total) by mouth daily. 05/06/17   Henreitta Leber, MD  Vitamin D, Ergocalciferol, (DRISDOL) 50000 units CAPS capsule Take 50,000 Units by mouth every 30 (thirty) days.    [provider]    Allergies Bupropion; Cheese; and Sulfamethoxazole-trimethoprim  Family History  Problem Relation Age of Onset  . Heart disease Father   . Diabetes Mellitus II Other     Social History Social History   Tobacco Use  . Smoking status: Former Smoker    Packs/day: 0.50    Types: Cigarettes    Last attempt to quit: 11/17/2015    Years since quitting: 1.9  . Smokeless tobacco: Never Used  Substance Use Topics  . Alcohol use: No  . Drug use: No    Review of Systems  Constitutional: No fever/chills Eyes: No visual changes. ENT: No sore throat. Cardiovascular: Denies chest pain. Respiratory: Cough and shortness of breath. Gastrointestinal: No abdominal pain.  No nausea, no vomiting.  No diarrhea.  No constipation. Genitourinary: Negative for dysuria. Musculoskeletal: Negative for back pain. Skin: Negative for rash. Neurological: Negative for headaches, focal weakness or numbness.   ____________________________________________   PHYSICAL EXAM:  VITAL SIGNS: ED Triage Vitals  Enc Vitals Group     BP 10/13/17 2300 130/68     Pulse Rate 10/13/17 2233 93     Resp 10/13/17 2233 (!) 22     Temp 10/13/17 2233 98.6 F (37 C)     Temp Source 10/13/17 2233 Oral     SpO2 10/13/17 2233 90 %     Weight 10/13/17 2234 200 lb (90.7 kg)     Height 10/13/17 2234 5\' 8"  (1.727 m)     Head Circumference --      Peak Flow --      Pain Score 10/13/17 2234 0     Pain Loc --      Pain Edu? --      Excl. in [place]? --     Constitutional: Alert and oriented. Well appearing and in moderate distress. Eyes:  Conjunctivae are normal. PERRL. EOMI. Head:  Atraumatic. Nose: No congestion/rhinnorhea. Mouth/Throat: Mucous membranes are moist.  Oropharynx non-erythematous. Cardiovascular: Normal rate, regular rhythm. Grossly normal heart sounds.  Good peripheral circulation. Respiratory: Increased respiratory effort.  Subcostal retractions.  Expiratory wheezes in all lung fields. Gastrointestinal: Soft and nontender. No distention.  Positive bowel sounds Musculoskeletal: No lower extremity tenderness nor edema.   Neurologic:  Normal speech and language.  Skin:  Skin is warm, dry and intact.  Psychiatric: Mood and affect are normal.   ____________________________________________   LABS (all labs ordered are listed, but only abnormal results are displayed)  Labs Reviewed  BASIC METABOLIC PANEL - Abnormal; Notable for the following components:      Result Value   Glucose, Bld 177 (*)    Creatinine, Ser 1.35 (*)    Calcium 8.5 (*)    GFR calc non Af Amer 49 (*)    GFR calc Af Amer 57 (*)    All other components within normal limits  CBC - Abnormal; Notable for the following components:   WBC 11.4 (*)    Hemoglobin 10.2 (*)    HCT 33.5 (*)    MCV 74.1 (*)    MCH 22.7 (*)    MCHC 30.6 (*)    RDW 20.2 (*)    All other components within normal limits  BLOOD GAS, VENOUS - Abnormal; Notable for the following components:   Bicarbonate 31.7 (*)    Acid-Base Excess 6.1 (*)    All other components within normal limits  TROPONIN I   ____________________________________________  EKG  ED ECG REPORT I, Loney Hering, the attending physician, personally viewed and interpreted this ECG.   Date: 10/13/2017  EKG Time: 2227  Rate: 95  Rhythm: normal sinus rhythm  Axis: normal  Intervals:none  ST&T Change: none  ____________________________________________  RADIOLOGY  ED MD interpretation:  CXR: No active disease  Official radiology report(s): Dg Chest Portable 1 View  Result Date: 10/13/2017 CLINICAL DATA:  Shortness of  breath EXAM: PORTABLE CHEST 1 VIEW COMPARISON:  Sep 22, 2017 FINDINGS: Stable cardiomegaly. The hila and mediastinum are normal. No pneumothorax. No pulmonary nodules, masses, focal infiltrates, or overt edema. IMPRESSION: No active disease. Electronically Signed   By: Dorise Bullion III M.D   On: 10/13/2017 23:04    ____________________________________________   PROCEDURES  Procedure(s) performed: please, see procedure note(s).  .Critical Care Performed by: Loney Hering, MD Authorized by: Loney Hering, MD   Critical care provider statement:    Critical care time (minutes):  30   Critical care start time:  10/13/2017 11:16 PM   Critical care end time:  10/13/2017 11:46 PM   Critical care time was exclusive of:  Separately billable procedures and treating other patients   Critical care was necessary to treat or prevent imminent or life-threatening deterioration of the following conditions:  Respiratory failure   Critical care was time spent personally by me on the following activities:  Development of treatment plan with patient or surrogate, evaluation of patient's response to treatment, examination of patient, obtaining history from patient or surrogate, ordering and performing treatments and interventions, ordering and review of laboratory studies, ordering and review of radiographic studies, pulse oximetry, re-evaluation of patient's condition and review of old charts   I assumed direction of critical care for this patient from another provider in my specialty: no      Critical Care performed: Yes, see critical care note(s)  ____________________________________________   INITIAL  IMPRESSION / ASSESSMENT AND PLAN / ED COURSE  As part of my medical decision making, I reviewed the following data within the electronic MEDICAL RECORD NUMBER Notes from prior ED visits and [place] Controlled Substance Database   This is a 77 year old male who comes into the hospital today with some  shortness of breath and cough.  The patient has a smoking history and has some significant wheezing.  I feel that he may be having some COPD exacerbation versus pneumonia versus bronchitis.  We did check some blood work on the patient to include a CBC, BMP, troponin and a blood gas.  The patient's blood work is fairly unremarkable.  The patient also had a chest x-ray which did not show any new pneumonia.  The patient received medications from EMS.  The patient was given a third DuoNeb as well as some magnesium sulfate.  When the wheezing persisted he received another dose of albuterol.  He still had some wheezing so he will be admitted to the hospitalist service.      ____________________________________________   FINAL CLINICAL IMPRESSION(S) / ED DIAGNOSES  Final diagnoses:  COPD exacerbation ([place])  Hypoxia     ED Discharge Orders    None       Note:  This document was prepared using Dragon voice recognition software and may include unintentional dictation errors.    Loney Hering, MD 10/14/17 Reece Agar

## 2017-10-14 NOTE — Progress Notes (Signed)
[facility] in the [facility] and lives alone in [place]. Per [place] with [facility] he is on 2 liters of oxygen at baseline.   [employer], LCSW [phone]

## 2017-10-29 NOTE — Discharge Summary (Signed)
[facility] [place] NAME: Jimmy Hill    MR#:  [id number]  DATE OF BIRTH:  January 27, 1941  DATE OF ADMISSION:  10/13/2017 ADMITTING PHYSICIAN: Arta Silence, MD  DATE OF DISCHARGE: 10/14/2017  PRIMARY CARE PHYSICIAN: [facility] [place]    ADMISSION DIAGNOSIS:  Hypoxia [R09.02] COPD exacerbation ([place]) [J44.1]  DISCHARGE DIAGNOSIS:  acute on chronic COPD exacerbation  SECONDARY DIAGNOSIS:   Past Medical History:  Diagnosis Date  . Allergic rhinitis   . Anginal pain ([place])   . Arthritis   . Autonomic dysreflexia   . Back pain    CHRONIC LBP,STENOSIS  . CHF (congestive heart failure) ([place])   . Chronic kidney disease    STAGE 3  . COPD (chronic obstructive pulmonary disease) ([place])   . Coronary artery disease   . Depression   . Diabetes ([place])   . Diabetes mellitus without complication ([place])   . DJD (degenerative joint disease)   . Dysphagia   . Dysrhythmia    A-fib, now converted  . Edema    FEET/ANKLES  . GERD (gastroesophageal reflux disease)   . Glaucoma   . H/O orthostatic hypotension   . Hypercholesterolemia   . Myocardial infarction ([place]) 2000  . Parkinson's disease ([place])   . Peripheral vascular disease ([place])   . Pneumonia    history of   . Polyneuropathy   . RLS (restless legs syndrome)   . Seizures ([place])    when in rehab for his hip  . Sleep apnea   . Smoker   . Spinal stenosis   . Strabismic amblyopia   . Tinnitus     HOSPITAL COURSE:   Jimmy Hill. is a 77 y.o. male who comes into the hospital today with some shortness of breath.  The patient was sneezing coughing and could not catch his breath.  The symptoms started around 8 PM this evening.  He has been on intermittent O2 for about 2 weeks and then was changed to 2 L of continuous O2.  He denies any chest pain but has had a cough productive of white sputum.   1. acute on chronic COPD exacerbation -she was  on IV steroid. Transition to PO taper. Sats at baseline. -Empirically azithromycin -continue incentive spirometer, pulmonary tolerating, inhalers and nebulizer  2. chronic kidney disease stage III -stable  3. Hypertension continue home meds  4. Diabetes sliding scale and home medications  If continues to improve will discharge patient back to home with continuing [facility] program at [facility].pIt is agreeable with plan  CONSULTS OBTAINED:  Treatment Team:  Arta Silence, MD  DRUG ALLERGIES:   Allergies  Allergen Reactions  . Bupropion Other (See Comments)    Seizure  . Cheese     Patient doesn't want any cheese  . Sulfamethoxazole-Trimethoprim Other (See Comments)    DISCHARGE MEDICATIONS:   Allergies as of 10/14/2017      Reactions   Bupropion Other (See Comments)   Seizure   Cheese    Patient doesn't want any cheese   Sulfamethoxazole-trimethoprim Other (See Comments)      Medication List    STOP taking these medications   potassium chloride 20 MEQ packet Commonly known as:  KLOR-CON     TAKE these medications   albuterol (2.5 MG/3ML) 0.083% nebulizer solution Commonly known as:  PROVENTIL Take 2.5 mg by nebulization 4 (four) times daily as needed for wheezing or shortness of breath.  amiodarone 100 MG tablet Commonly known as:  PACERONE Take 100 mg by mouth daily.   atorvastatin 80 MG tablet Commonly known as:  LIPITOR Take 80 mg by mouth at bedtime.   azithromycin 250 MG tablet Commonly known as:  ZITHROMAX Take as directed   carbidopa-levodopa 25-250 MG tablet Commonly known as:  SINEMET IR Take 2 tablets by mouth every morning (8AM), 1 tablet at noon, 1 tablet every afternoon (430PM) and 1 tablet at bedtime (10PM)   COMBIGAN 0.2-0.5 % ophthalmic solution Generic drug:  brimonidine-timolol Place 1 drop into both eyes 2 (two) times daily.   GLUTOSE 15 40 % Gel Generic drug:  dextrose Take 1 Tube by mouth. For blood sugar less than 70, repeat in  5 minutes up to 2 doses   insulin glargine 100 UNIT/ML injection Commonly known as:  LANTUS Inject 14 Units into the skin every morning.   magnesium oxide 400 MG tablet Commonly known as:  MAG-OX Take 400 mg by mouth daily.   metFORMIN 1000 MG tablet Commonly known as:  GLUCOPHAGE Take 1,000 mg by mouth 2 (two) times daily with a meal.   metoprolol succinate 25 MG 24 hr tablet Commonly known as:  TOPROL-XL Take 25 mg by mouth daily.   nitroGLYCERIN 0.4 MG SL tablet Commonly known as:  NITROSTAT Place 0.4 mg under the tongue every 5 (five) minutes as needed for chest pain.   potassium chloride 10 MEQ tablet Commonly known as:  K-DUR,KLOR-CON Take 20 mEq by mouth daily.   predniSONE 10 MG tablet Commonly known as:  DELTASONE Take 5 tablets (50 mg total) by mouth daily with breakfast. Take 50 mg daily taper by 10 mg daily then stop   pregabalin 50 MG capsule Commonly known as:  LYRICA Take 50 mg by mouth 2 (two) times daily.   REFRESH OPTIVE OP Apply 2 drops to eye every 8 (eight) hours as needed (dry eyes).   traMADol 50 MG tablet Commonly known as:  ULTRAM Take 50 mg by mouth every 6 (six) hours as needed for moderate pain.   TUSSIN DM 10-100 MG/5ML liquid Generic drug:  Dextromethorphan-guaiFENesin Take 5 mLs by mouth every 6 (six) hours as needed (cough).       If you experience worsening of your admission symptoms, develop shortness of breath, life threatening emergency, suicidal or homicidal thoughts you must seek medical attention immediately by calling 911 or calling your MD immediately  if symptoms less severe.  You Must read complete instructions/literature along with all the possible adverse reactions/side effects for all the Medicines you take and that have been prescribed to you. Take any new Medicines after you have completely understood and accept all the possible adverse reactions/side effects.   Please note  You were cared for by a hospitalist  during your hospital stay. If you have any questions about your discharge medications or the care you received while you were in the hospital after you are discharged, you can call the unit and asked to speak with the hospitalist on call if the hospitalist that took care of you is not available. Once you are discharged, your primary care physician will handle any further medical issues. Please note that NO REFILLS for any discharge medications will be authorized once you are discharged, as it is imperative that you return to your primary care physician (or establish a relationship with a primary care physician if you do not have one) for your aftercare needs so that they can reassess your  need for medications and monitor your lab values. Today   SUBJECTIVE   Breathing better. Appears at baseline  VITAL SIGNS:  Blood pressure 125/73, pulse 92, temperature 97.7 F (36.5 C), temperature source Oral, resp. rate 18, height 5\' 8"  (1.727 m), weight 89.9 kg (198 lb 3.1 oz), SpO2 94 %.  I/O:  No intake or output data in the 24 hours ending 10/29/17 1201  PHYSICAL EXAMINATION:  GENERAL:  77 y.o.-year-old patient lying in the bed with no acute distress. The ill EYES: Pupils equal, round, reactive to light and accommodation. No scleral icterus. Extraocular muscles intact.  HEENT: Head atraumatic, normocephalic. Oropharynx and nasopharynx clear.  NECK:  Supple, no jugular venous distention. No thyroid enlargement, no tenderness.  LUNGS distant breath sounds bilaterally, no wheezing, rales,rhonchi or crepitation. No use of accessory muscles of respiration.  CARDIOVASCULAR: S1, S2 normal. No murmurs, rubs, or gallops.  ABDOMEN: Soft, non-tender, non-distended. Bowel sounds present. No organomegaly or mass.  EXTREMITIES: No pedal edema, cyanosis, or clubbing.  NEUROLOGIC: Cranial nerves II through XII are intact. Muscle strength 5/5 in all extremities. Sensation intact. Gait not checked.  PSYCHIATRIC: The  patient is alert and oriented x 3.  SKIN: No obvious rash, lesion, or ulcer.   DATA REVIEW:   CBC  No results for input(s): WBC, HGB, HCT, PLT in the last 168 hours.  Chemistries  No results for input(s): NA, K, CL, CO2, GLUCOSE, BUN, CREATININE, CALCIUM, MG, AST, ALT, ALKPHOS, BILITOT in the last 168 hours.  Invalid input(s): [place]  Microbiology Results   No results found for this or any previous visit (from the past 240 hour(s)).  RADIOLOGY:  No results found.   Management plans discussed with the patient, family and they are in agreement.  CODE STATUS:  Code Status History    Date Active Date Inactive Code Status Order ID Comments User Context   10/14/2017 0310 10/14/2017 1821 Full Code [id number]  Arta Silence, MD ED   09/23/2017 0202 09/23/2017 1857 Full Code [id number]  Amelia Jo, MD Inpatient   04/30/2017 0834 05/06/2017 2029 Full Code [id number]  Harrie Foreman, MD Inpatient   07/04/2016 0028 07/04/2016 1846 Full Code [id number]  [place], [place], DO Inpatient   07/04/2016 0028 07/04/2016 0028 Full Code [id number]  [place], [place], DO Inpatient   03/03/2016 0013 03/04/2016 2200 Full Code [id number]  Saundra Shelling, MD Inpatient   12/28/2015 1718 12/30/2015 2008 Full Code [id number]  Henreitta Leber, MD Inpatient   12/03/2015 2059 12/04/2015 1530 Full Code [id number]  [place], MD ED   02/05/2015 1614 02/06/2015 1404 Full Code [id number]  Theodoro Grist, MD Inpatient   12/10/2014 0047 12/10/2014 1636 Full Code [id number]  Nicholes Mango, MD Inpatient    Advance Directive Documentation     Most Recent Value  Type of Advance Directive  Healthcare Power of Attorney  Pre-existing out of facility DNR order (yellow form or pink MOST form)  -  "MOST" Form in Place?  -      TOTAL TIME TAKING CARE OF THIS PATIENT: *40* minutes.    Fritzi Mandes M.D on 10/29/2017 at 12:01 PM  Between 7am to 6pm - Pager - [phone] After 6pm go to [email or web address] - password [email or web address]  [facility]  Office  [phone]  CC: Primary care physician; [facility] [place]

## 2017-12-16 ENCOUNTER — Ambulatory Visit
Admission: RE | Admit: 2017-12-16 | Discharge: 2017-12-16 | Disposition: A | Discharge status: Home / Self Care | Payer: No Typology Code available for payment source | Source: Ambulatory Visit | Attending: Family Medicine | Admitting: Family Medicine

## 2017-12-16 ENCOUNTER — Other Ambulatory Visit: Payer: Self-pay | Admitting: Family Medicine

## 2017-12-16 DIAGNOSIS — W19XXXA Unspecified fall, initial encounter: Secondary | ICD-10-CM | POA: Diagnosis not present

## 2017-12-16 DIAGNOSIS — R51 Headache: Secondary | ICD-10-CM | POA: Insufficient documentation

## 2017-12-16 DIAGNOSIS — G319 Degenerative disease of nervous system, unspecified: Secondary | ICD-10-CM | POA: Diagnosis not present

## 2017-12-16 DIAGNOSIS — H539 Unspecified visual disturbance: Secondary | ICD-10-CM

## 2017-12-16 DIAGNOSIS — I639 Cerebral infarction, unspecified: Secondary | ICD-10-CM

## 2018-01-04 ENCOUNTER — Other Ambulatory Visit: Payer: Self-pay | Admitting: Family Medicine

## 2018-01-04 DIAGNOSIS — K769 Liver disease, unspecified: Secondary | ICD-10-CM

## 2018-01-19 ENCOUNTER — Ambulatory Visit
Admission: RE | Admit: 2018-01-19 | Discharge: 2018-01-19 | Disposition: A | Discharge status: Home / Self Care | Payer: No Typology Code available for payment source | Source: Ambulatory Visit | Attending: Family Medicine | Admitting: Family Medicine

## 2018-01-19 DIAGNOSIS — R160 Hepatomegaly, not elsewhere classified: Secondary | ICD-10-CM | POA: Diagnosis not present

## 2018-01-19 DIAGNOSIS — R590 Localized enlarged lymph nodes: Secondary | ICD-10-CM | POA: Diagnosis not present

## 2018-01-19 DIAGNOSIS — K769 Liver disease, unspecified: Secondary | ICD-10-CM | POA: Diagnosis present

## 2018-01-19 DIAGNOSIS — K7689 Other specified diseases of liver: Secondary | ICD-10-CM | POA: Diagnosis not present

## 2018-01-22 ENCOUNTER — Encounter: Payer: Self-pay | Admitting: Hematology and Oncology

## 2018-01-22 ENCOUNTER — Inpatient Hospital Stay
Payer: No Typology Code available for payment source | Attending: Hematology and Oncology | Admitting: Hematology and Oncology

## 2018-01-22 ENCOUNTER — Ambulatory Visit: Payer: No Typology Code available for payment source

## 2018-01-22 ENCOUNTER — Other Ambulatory Visit: Payer: Self-pay

## 2018-01-22 ENCOUNTER — Other Ambulatory Visit: Payer: Self-pay | Admitting: Hematology and Oncology

## 2018-01-22 ENCOUNTER — Telehealth: Payer: Self-pay | Admitting: *Deleted

## 2018-01-22 VITALS — BP 101/69 | HR 88 | Temp 98.0°F | Resp 20 | Wt 197.3 lb

## 2018-01-22 DIAGNOSIS — F329 Major depressive disorder, single episode, unspecified: Secondary | ICD-10-CM | POA: Diagnosis not present

## 2018-01-22 DIAGNOSIS — K769 Liver disease, unspecified: Secondary | ICD-10-CM | POA: Insufficient documentation

## 2018-01-22 DIAGNOSIS — I509 Heart failure, unspecified: Secondary | ICD-10-CM | POA: Insufficient documentation

## 2018-01-22 DIAGNOSIS — G20 Parkinson's disease: Secondary | ICD-10-CM | POA: Diagnosis not present

## 2018-01-22 DIAGNOSIS — E1122 Type 2 diabetes mellitus with diabetic chronic kidney disease: Secondary | ICD-10-CM | POA: Insufficient documentation

## 2018-01-22 DIAGNOSIS — D649 Anemia, unspecified: Secondary | ICD-10-CM | POA: Insufficient documentation

## 2018-01-22 DIAGNOSIS — F17210 Nicotine dependence, cigarettes, uncomplicated: Secondary | ICD-10-CM

## 2018-01-22 DIAGNOSIS — D509 Iron deficiency anemia, unspecified: Secondary | ICD-10-CM | POA: Insufficient documentation

## 2018-01-22 DIAGNOSIS — Z87891 Personal history of nicotine dependence: Secondary | ICD-10-CM | POA: Insufficient documentation

## 2018-01-22 DIAGNOSIS — N183 Chronic kidney disease, stage 3 (moderate): Secondary | ICD-10-CM

## 2018-01-22 DIAGNOSIS — C801 Malignant (primary) neoplasm, unspecified: Secondary | ICD-10-CM | POA: Insufficient documentation

## 2018-01-22 DIAGNOSIS — C787 Secondary malignant neoplasm of liver and intrahepatic bile duct: Secondary | ICD-10-CM | POA: Diagnosis not present

## 2018-01-22 DIAGNOSIS — J449 Chronic obstructive pulmonary disease, unspecified: Secondary | ICD-10-CM | POA: Diagnosis not present

## 2018-01-22 DIAGNOSIS — R970 Elevated carcinoembryonic antigen [CEA]: Secondary | ICD-10-CM | POA: Insufficient documentation

## 2018-01-22 DIAGNOSIS — I2510 Atherosclerotic heart disease of native coronary artery without angina pectoris: Secondary | ICD-10-CM | POA: Diagnosis not present

## 2018-01-22 LAB — CBC WITH DIFFERENTIAL/PLATELET
Basophils Absolute: 0.1 10*3/uL (ref 0–0.1)
Basophils Relative: 1 %
Eosinophils Absolute: 0.3 10*3/uL (ref 0–0.7)
Eosinophils Relative: 4 %
HCT: 36.8 % — ABNORMAL LOW (ref 40.0–52.0)
Hemoglobin: 11.6 g/dL — ABNORMAL LOW (ref 13.0–18.0)
Lymphocytes Relative: 33 %
Lymphs Abs: 2.4 10*3/uL (ref 1.0–3.6)
MCH: 24.8 pg — ABNORMAL LOW (ref 26.0–34.0)
MCHC: 31.4 g/dL — ABNORMAL LOW (ref 32.0–36.0)
MCV: 79 fL — ABNORMAL LOW (ref 80.0–100.0)
Monocytes Absolute: 0.7 10*3/uL (ref 0.2–1.0)
Monocytes Relative: 10 %
Neutro Abs: 3.8 10*3/uL (ref 1.4–6.5)
Neutrophils Relative %: 52 %
Platelets: 205 10*3/uL (ref 150–440)
RBC: 4.66 MIL/uL (ref 4.40–5.90)
RDW: 20.2 % — ABNORMAL HIGH (ref 11.5–14.5)
WBC: 7.2 10*3/uL (ref 3.8–10.6)

## 2018-01-22 LAB — RETICULOCYTES
RBC.: 4.55 MIL/uL (ref 4.40–5.90)
Retic Count, Absolute: 86.5 10*3/uL (ref 19.0–183.0)
Retic Ct Pct: 1.9 % (ref 0.4–3.1)

## 2018-01-22 LAB — IRON AND TIBC
Iron: 32 ug/dL — ABNORMAL LOW (ref 45–182)
Saturation Ratios: 9 % — ABNORMAL LOW (ref 17.9–39.5)
TIBC: 360 ug/dL (ref 250–450)
UIBC: 328 ug/dL

## 2018-01-22 LAB — COMPREHENSIVE METABOLIC PANEL
ALT: 9 U/L (ref 0–44)
AST: 28 U/L (ref 15–41)
Albumin: 3.3 g/dL — ABNORMAL LOW (ref 3.5–5.0)
Alkaline Phosphatase: 69 U/L (ref 38–126)
Anion gap: 14 (ref 5–15)
BUN: 12 mg/dL (ref 8–23)
CO2: 22 mmol/L (ref 22–32)
Calcium: 8.4 mg/dL — ABNORMAL LOW (ref 8.9–10.3)
Chloride: 102 mmol/L (ref 98–111)
Creatinine, Ser: 1.04 mg/dL (ref 0.61–1.24)
GFR calc Af Amer: >60 mL/min (ref >60)
GFR calc non Af Amer: >60 mL/min (ref >60)
Glucose, Bld: 240 mg/dL — ABNORMAL HIGH (ref 70–99)
Potassium: 3.9 mmol/L (ref 3.5–5.1)
Sodium: 138 mmol/L (ref 135–145)
Total Bilirubin: 0.6 mg/dL (ref 0.3–1.2)
Total Protein: 6.3 g/dL — ABNORMAL LOW (ref 6.5–8.1)

## 2018-01-22 LAB — VITAMIN B12: Vitamin B-12: 635 pg/mL (ref 180–914)

## 2018-01-22 LAB — APTT: aPTT: 32 seconds (ref 24–36)

## 2018-01-22 LAB — PROTIME-INR
INR: 1.1
Prothrombin Time: 14.1 seconds (ref 11.4–15.2)

## 2018-01-22 LAB — FOLATE: Folate: 8.9 ng/mL (ref >5.9)

## 2018-01-22 LAB — FERRITIN: Ferritin: 12 ng/mL — ABNORMAL LOW (ref 24–336)

## 2018-01-22 NOTE — Progress Notes (Signed)
[facility] day:  01/22/2018  Chief Complaint: Jimmy Hill. is a 77 y.o. male with liver lesions who is referred in consultation by Dr Leticia Penna for assessment and management.  HPI:  The patient notes recent evaluation by Dr. Meredith Staggers.  Upon reviewing records, patient stated that he needed follow-up imaging.  He notes RUQ pain.  He denies any history of hepatitis.  He notes a period of frequent hospitalizations when he underwent xrays, MRIs, and endoscopies for blood in the stool.  EGD at [facility] on 04/17/2017 revealed diffuse atrophic mucosa in the entire stomach and multiple localized erosions without bleeding.  Pathology revealed foveolar hyperplasia c/w reactive gastropathy.  Colonoscopy at [facility] on 04/17/2017 revealed two 5-7 mm polyps in the descending colon and transverse colon (adenomatous polyps).  Capsule study on 05/05/2017 revealed polyps x 2 in the proximal small bowel and AVMs x 2 in the terminal ileum and proximal small bowel.  Plan was anterograde and retrograde balloon enteroscopy at [facility].  CTA abdomen and pelvis with and without contrast on 05/02/2017 revealed a nodular liver.  There was an ill-defined low-density lesion in the lateral segment of the left lobe of liver on image 15 of series 6. There was a nodular tiny lesion in the medial segment of the left lobe on image 13. Tiny right lobe lesion was present on image 34 of series 6.  There was no retroperitoneal adenopathy.  MRI abdomen on 01/18/2018 revealed mild degradation secondary to motion and lack of IV contrast.  There was significant enlargement of a lateral segment left liver lobe lesion (4.1 x 3.2 cm). Given adjacent new 3.6 x 3.3 cm gastrohepatic ligament adenopathy, differential considerations include hepatocellular carcinoma or metastatic disease from an unknown primary. Correlate with risk factors for cirrhosis. Consider tissue sampling of the lateral segment left  liver lobe versus further evaluation with PET to direct tissue sampling.  There were smaller bilateral liver lesions which are also suspicious for hepatocellular carcinomas or metastatic disease.  CXR on 10/13/2017 revealed no active disease.  CBC on 11/26/2017 revealed adequate to 37.6, hemoglobin 10.9, MCV 80, platelets 259,000, white count 13,400 with an ANC of 10,100.  Creatinine was 1.01, sodium 138, protein 5.9, and albumin 3.2.  Liver function tests were normal.  TSH was 0.537.  Regarding his diet, he eats meat several times/week.  He eats green leafy vegetables.  He denies bleeding (hematuria, melena or hematochezia).  He denies ice pica.   Past Medical History:  Diagnosis Date  . Allergic rhinitis   . Anginal pain ([place])   . Arthritis   . Autonomic dysreflexia   . Back pain    CHRONIC LBP,STENOSIS  . CHF (congestive heart failure) ([place])   . Chronic kidney disease    STAGE 3  . COPD (chronic obstructive pulmonary disease) ([place])   . Coronary artery disease   . Depression   . Diabetes ([place])   . Diabetes mellitus without complication ([place])   . DJD (degenerative joint disease)   . Dysphagia   . Dysrhythmia    A-fib, now converted  . Edema    FEET/ANKLES  . GERD (gastroesophageal reflux disease)   . Glaucoma   . H/O orthostatic hypotension   . Hypercholesterolemia   . Myocardial infarction ([place]) 2000  . Parkinson's disease ([place])   . Peripheral vascular disease ([place])   . Pneumonia    history of   . Polyneuropathy   . RLS (restless  legs syndrome)   . Seizures ([place])    when in rehab for his hip  . Sleep apnea   . Smoker   . Spinal stenosis   . Strabismic amblyopia   . Tinnitus     Past Surgical History:  Procedure Laterality Date  . ANKLE SURGERY    . APPENDECTOMY    . BACK SURGERY    . CATARACT EXTRACTION W/PHACO Left 08/26/2016   Procedure: CATARACT EXTRACTION PHACO AND INTRAOCULAR LENS PLACEMENT (IOC);  Surgeon: Birder Robson, MD;  Location: [facility] ORS;   Service: Ophthalmology;  Laterality: Left;  [place] 01:37 AP% 19.4 CDE 18.97 fluid pack lot # [id number]  . CORONARY ANGIOPLASTY     STENT  . CTR    . ESOPHAGOGASTRODUODENOSCOPY (EGD) WITH PROPOFOL N/A 03/04/2016   Procedure: ESOPHAGOGASTRODUODENOSCOPY (EGD) WITH PROPOFOL;  Surgeon: [name], MD;  Location: [facility] ENDOSCOPY;  Service: Endoscopy;  Laterality: N/A;  . EYE SURGERY     cataract  . FRACTURE SURGERY    . HIP PINNING,CANNULATED Left 12/04/2015   Procedure: CANNULATED HIP PINNING;  Surgeon: Thornton Park, MD;  Location: [facility] ORS;  Service: Orthopedics;  Laterality: Left;    Family History  Problem Relation Age of Onset  . Heart disease Father   . Diabetes Mellitus II Other     Social History:  reports that he quit smoking about 2 years ago. His smoking use included cigarettes. He smoked 0.50 packs per day. He has never used smokeless tobacco. He reports that he does not drink alcohol or use drugs.  He smoked 2 packs/week x 60 years.  He is unaware of any exposures to radiation or toxins.  He is a retired Production designer, theatre/television/film.  He lives alone.  He has a wheelchair at his house (Parkinson's, diabetes with neuropathy).  He is able to perform is ADLs.  He has aides come out during the day.  His wife has dementia and lives in the [facility].  The patient is accompanied by his daughter, Jimmy Hill, today.  Allergies:  Allergies  Allergen Reactions  . Bupropion Other (See Comments)    Seizure  . Cheese     Patient doesn't want any cheese  . Sulfamethoxazole-Trimethoprim Other (See Comments)    Current Medications: Current Outpatient Medications  Medication Sig Dispense Refill  . albuterol (PROVENTIL) (2.5 MG/3ML) 0.083% nebulizer solution Take 2.5 mg by nebulization 4 (four) times daily as needed for wheezing or shortness of breath.    Marland Kitchen amiodarone (PACERONE) 100 MG tablet Take 100 mg by mouth daily.    Marland Kitchen atorvastatin (LIPITOR) 80 MG tablet Take 80 mg by mouth at bedtime.     . brimonidine-timolol (COMBIGAN) 0.2-0.5 % ophthalmic solution Place 1 drop into both eyes 2 (two) times daily.    . carbidopa-levodopa (SINEMET IR) 25-250 MG tablet Take 2 tablets by mouth every morning (8AM), 1 tablet at noon, 1 tablet every afternoon (430PM) and 1 tablet at bedtime (10PM)    . Carboxymethylcellul-Glycerin (REFRESH OPTIVE OP) Apply 2 drops to eye every 8 (eight) hours as needed (dry eyes).    . Dextromethorphan-Guaifenesin (TUSSIN DM) 10-100 MG/5ML liquid Take 5 mLs by mouth every 6 (six) hours as needed (cough).     Marland Kitchen dextrose (GLUTOSE 15) 40 % GEL Take 1 Tube by mouth. For blood sugar less than 70, repeat in 5 minutes up to 2 doses     . insulin glargine (LANTUS) 100 UNIT/ML injection Inject 14 Units into the skin every morning.     Marland Kitchen  magnesium oxide (MAG-OX) 400 MG tablet Take 400 mg by mouth daily.    . metFORMIN (GLUCOPHAGE) 1000 MG tablet Take 1,000 mg by mouth 2 (two) times daily with a meal.     . metoprolol succinate (TOPROL-XL) 25 MG 24 hr tablet Take 25 mg by mouth daily.    . nitroGLYCERIN (NITROSTAT) 0.4 MG SL tablet Place 0.4 mg under the tongue every 5 (five) minutes as needed for chest pain.    . potassium chloride (K-DUR,KLOR-CON) 10 MEQ tablet Take 20 mEq by mouth daily.    . predniSONE (DELTASONE) 10 MG tablet Take 5 tablets (50 mg total) by mouth daily with breakfast. Take 50 mg daily taper by 10 mg daily then stop 15 tablet 0  . pregabalin (LYRICA) 50 MG capsule Take 50 mg by mouth 2 (two) times daily.    . traMADol (ULTRAM) 50 MG tablet Take 50 mg by mouth every 6 (six) hours as needed for moderate pain.    Marland Kitchen apixaban (ELIQUIS) 5 MG TABS tablet Take by mouth.     No current facility-administered medications for this visit.     Review of Systems:  GENERAL:  Feels "fine" except for RUQ pain.  No fevers, sweats.  Weight up and down. 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  Glaucoma.  Runny nose.   No sore throat, mouth sores or tenderness. Lungs: No shortness of breath.  Cough.  No hemoptysis. Cardiac:  No chest pain, palpitations, orthopnea, or PND. GI:  Little nausea.  Diarrhea last night and today.  No vomiting, constipation, melena or hematochezia. GU:  Urgency and frequency.  No dysuria, or hematuria. Musculoskeletal:  Bones and joints ache.  No muscle tenderness. Extremities:  No pain or swelling. Skin:  No rashes or skin changes. Neuro:  No headache, numbness or weakness, balance or coordination issues. Endocrine:  No diabetes, thyroid issues, hot flashes or night sweats. Psych:  No mood changes, depression or anxiety. Pain:  No focal pain. Review of systems:  All other systems reviewed and found to be negative.  Physical Exam: Blood pressure 101/69, pulse 88, temperature 98 F (36.7 C), temperature source Tympanic, resp. rate 20, weight 197 lb 5 oz (89.5 kg), SpO2 95 %. GENERAL:  Elderly gentleman sitting comfortably in a wheelchair in the exam room in no acute distress.  He requires 2 person assist onto the exam table. MENTAL STATUS:  Alert and oriented to person, place and time. HEAD:  Pearline Cables hair.  Scruffy beard.  Normocephalic, atraumatic, face symmetric, no Cushingoid features. EYES:  Blue eyes.  Pupils equal round and reactive to light and accomodation.  No conjunctivitis or scleral icterus. ENT:   in place.  Oropharynx clear without lesion.  Tongue normal. Mucous membranes moist.  RESPIRATORY:  Clear to auscultation without rales, wheezes or rhonchi. CARDIOVASCULAR:  Regular rate and rhythm without murmur, rub or gallop. CHEST:  Right chest with subtle area of mild hyperpigmentation tender to touch.  No discrete mass. ABDOMEN:  Soft, non-tender, with active bowel sounds, and no hepatosplenomegaly.  No masses. SKIN:  Left posterior neck mole.  No rashes, ulcers or lesions. EXTREMITIES: Nicotine stained nails.  No edema, no skin discoloration or tenderness.  No palpable  cords. LYMPH NODES: No palpable cervical, supraclavicular, axillary or inguinal adenopathy  NEUROLOGICAL: Unremarkable. PSYCH:  Appropriate.   Appointment on 01/22/2018  Component Date Value Ref Range Status  . Prothrombin Time 01/22/2018 14.1  11.4 -  15.2 seconds Final  . INR 01/22/2018 1.10   Final   Performed at [facility], [address] [place], [place] [address]  . aPTT 01/22/2018 32  24 - 36 seconds Final   Performed at [facility], [address] [place], [place] [address]  . Folate 01/22/2018 8.9  >5.9 ng/mL Final   Performed at [facility], [address] [place], [place] [address]  . Vitamin B-12 01/22/2018 635  180 - 914 pg/mL Final   Comment: (NOTE) This assay is not validated for testing neonatal or myeloproliferative syndrome specimens for Vitamin B12 levels. Performed at [facility], [place] [address] [place], [place] [address]   . Iron 01/22/2018 32* 45 - 182 ug/dL Final  . TIBC 01/22/2018 360  250 - 450 ug/dL Final  . Saturation Ratios 01/22/2018 9* 17.9 - 39.5 % Final  . UIBC 01/22/2018 328  ug/dL Final   Performed at [facility], [address], [place], [place] [address]  . Ferritin 01/22/2018 12* 24 - 336 ng/mL Final   Performed at [facility], [address] [place], [place] [address]  . Retic Ct Pct 01/22/2018 1.9  0.4 - 3.1 % Final  . RBC. 01/22/2018 4.55  4.40 - 5.90 MIL/uL Final  . Retic Count, Absolute 01/22/2018 86.5  19.0 - 183.0 K/uL Final   Performed at [facility], [address] [place], [place] [address]  . Sodium 01/22/2018 138  135 - 145 mmol/L Final  . Potassium 01/22/2018 3.9  3.5 - 5.1 mmol/L Final  . Chloride 01/22/2018 102  98 - 111 mmol/L Final  . CO2 01/22/2018 22  22 - 32 mmol/L Final  . Glucose, Bld 01/22/2018 240* 70 - 99 mg/dL Final  . BUN 01/22/2018 12  8 - 23 mg/dL Final  . Creatinine, Ser 01/22/2018 1.04  0.61 - 1.24 mg/dL Final  . Calcium 01/22/2018  8.4* 8.9 - 10.3 mg/dL Final  . Total Protein 01/22/2018 6.3* 6.5 - 8.1 g/dL Final  . Albumin 01/22/2018 3.3* 3.5 - 5.0 g/dL Final  . AST 01/22/2018 28  15 - 41 U/L Final  . ALT 01/22/2018 9  0 - 44 U/L Final  . Alkaline Phosphatase 01/22/2018 69  38 - 126 U/L Final  . Total Bilirubin 01/22/2018 0.6  0.3 - 1.2 mg/dL Final  . GFR calc non Af Amer 01/22/2018 >60  >60 mL/min Final  . GFR calc Af Amer 01/22/2018 >60  >60 mL/min Final   Comment: (NOTE) The eGFR has been calculated using the CKD EPI equation. This calculation has not been validated in all clinical situations. eGFR's persistently <60 mL/min signify possible Chronic Kidney Disease.   Georgiann Hahn gap 01/22/2018 14  5 - 15 Final   Performed at [facility], [address] [place], [place] [address]  . WBC 01/22/2018 7.2  3.8 - 10.6 K/uL Final  . RBC 01/22/2018 4.66  4.40 - 5.90 MIL/uL Final  . Hemoglobin 01/22/2018 11.6* 13.0 - 18.0 g/dL Final  . HCT 01/22/2018 36.8* 40.0 - 52.0 % Final  . MCV 01/22/2018 79.0* 80.0 - 100.0 fL Final  . MCH 01/22/2018 24.8* 26.0 - 34.0 pg Final  . MCHC 01/22/2018 31.4* 32.0 - 36.0 g/dL Final  . RDW 01/22/2018 20.2* 11.5 - 14.5 % Final  . Platelets 01/22/2018 205  150 - 440 K/uL Final  . Neutrophils Relative % 01/22/2018 52  % Final  . Neutro Abs 01/22/2018 3.8  1.4 -  6.5 K/uL Final  . Lymphocytes Relative 01/22/2018 33  % Final  . Lymphs Abs 01/22/2018 2.4  1.0 - 3.6 K/uL Final  . Monocytes Relative 01/22/2018 10  % Final  . Monocytes Absolute 01/22/2018 0.7  0.2 - 1.0 K/uL Final  . Eosinophils Relative 01/22/2018 4  % Final  . Eosinophils Absolute 01/22/2018 0.3  0 - 0.7 K/uL Final  . Basophils Relative 01/22/2018 1  % Final  . Basophils Absolute 01/22/2018 0.1  0 - 0.1 K/uL Final   Performed at [facility], [address] [place], [place] [address]    Assessment:  Neng Albee. is a 77 y.o. male with liver lesions of unclear etiology.  He has a > 20 pack year smoking  history.  He denies any history of hepatitis.    MRI abdomen without contrast on 01/18/2018 revealed significant enlargement of a lateral segment left liver lobe lesion (4.1 x 3.2 cm). There was new adjacent 3.6 x 3.3 cm gastrohepatic ligament adenopathy.  Differential considerations include hepatocellular carcinoma or metastatic disease from an unknown primary. There were smaller bilateral liver lesions which are also suspicious for hepatocellular carcinomas or metastatic disease.  CXR on 10/13/2017 revealed no active disease.  He has a normocytic anemia.  Diet appears good.  Labs on 11/26/2017 revealed a hemoglobin of 10.9 and MCV 80.  Creatinine and TSH were normal.  EGD at [facility] on 04/17/2017 revealed diffuse atrophic mucosa in the entire stomach and multiple localized erosions without bleeding.  Pathology revealed foveolar hyperplasia c/w reactive gastropathy.  [facility] revealed two 5-7 mm polyps in the descending colon and transverse colon (adenomatous polyps).  Capsule study on 05/05/2017 revealed polyps x 2 in the proximal small bowel and AVMs x 2 in the terminal ileum and proximal small bowel.  Plan was anterograde and retrograde balloon enteroscopy at [facility].  Symptomatically, he notes RUQ pain.  He denies bleeding (hematuria, melena or hematochezia).  Exam is unremarkable.  Plan: 1.  Discuss liver lesions of unclear etiology, likely malignant.  MRI personally reviewed with patient and his daughter.  I agree with the radiologist findings.  Surrounding lymph nodes are also concerning for malignancy.  Suspect hepatocellular carcinoma or metastatic disease.  Discuss PET scan to assess extent of disease and possible other site for biopsy.  May need to pursue liver biopsy if no other options.  Discuss treatment based on results from biopsy.  Discuss that surgery is not an option.  Treatment would be systemic therapy. 2.  Discuss anemia.  Diet appears good.  He has a history  of melena.  Records of prior endoscopies will be obtained.  Reports reviewed.  Discussed baseline labs. 3.  Labs today:  CBC with diff, CMP, ferritin, iron studies, retic, B12, folate, hepatitis B core antibody, hepatitis  C antibody, CEA, AFP, CA19-9, PT, PTT. 4.  Schedule PET scan. 5.  RTC after PET scan for MD assess and discussion regarding direction of therapy.  Addendum:  Labs revealed ferritin of 12 and an iron saturation of 9% c/w iron deficiency anemia.  Patient was contacted to begin oral iron.   Lequita Asal, MD  01/22/2018, 5:35 PM

## 2018-01-22 NOTE — Telephone Encounter (Signed)
Called patient to inform him that his iron is low. MD recommends iron 325 mg  (one tablet daily to be taken with OJ or vitamin C).  Patient verbalizes understanding.

## 2018-01-22 NOTE — Progress Notes (Signed)
Met with Jimmy Hill and his daughter before and after consult with Dr. Mike Gip. Introduced Therapist, nutritional and provided contact information for any future needs/barriers. Went over instructions for PET. All questions answered. Oncology Nurse Navigator Documentation  Navigator Location: [facility]-Med Onc (01/22/18 1400)   )Navigator Encounter Type: Initial MedOnc (01/22/18 1400)                     Patient Visit Type: MedOnc;Initial (01/22/18 1400)                              Time Spent with Patient: 45 (01/22/18 1400)

## 2018-01-22 NOTE — Progress Notes (Signed)
New patient in with daughter for "liver masses and enlarged lymph nodes" per patient.

## 2018-01-22 NOTE — Telephone Encounter (Signed)
-----   Message from Lequita Asal, MD sent at 01/22/2018  4:45 PM EDT ----- Regarding: Please call patient  Iron stores are low.  Begin ferrous sulfate 325 mg po q day with OJ or vitamin C.  M  ----- Message ----- From: Interface, Lab In [place] Sent: 01/22/2018   3:07 PM EDT To: Lequita Asal, MD

## 2018-01-23 LAB — CANCER ANTIGEN 19-9: CA 19-9: 253 U/mL — ABNORMAL HIGH (ref 0–35)

## 2018-01-23 LAB — AFP TUMOR MARKER: AFP, Serum, Tumor Marker: 13.4 ng/mL — ABNORMAL HIGH (ref 0.0–8.3)

## 2018-01-23 LAB — CEA: CEA: 8.3 ng/mL — ABNORMAL HIGH (ref 0.0–4.7)

## 2018-01-23 LAB — HEPATITIS C ANTIBODY: HCV Ab: 0.1 s/co ratio (ref 0.0–0.9)

## 2018-01-23 LAB — HEPATITIS B CORE ANTIBODY, TOTAL: Hep B Core Total Ab: NEGATIVE

## 2018-01-26 ENCOUNTER — Ambulatory Visit: Admission: RE | Admit: 2018-01-26 | Payer: No Typology Code available for payment source | Source: Ambulatory Visit

## 2018-01-28 ENCOUNTER — Ambulatory Visit
Admission: RE | Admit: 2018-01-28 | Discharge: 2018-01-28 | Disposition: A | Discharge status: Home / Self Care | Payer: No Typology Code available for payment source | Source: Ambulatory Visit | Attending: Hematology and Oncology | Admitting: Hematology and Oncology

## 2018-01-28 DIAGNOSIS — N200 Calculus of kidney: Secondary | ICD-10-CM | POA: Diagnosis not present

## 2018-01-28 DIAGNOSIS — R590 Localized enlarged lymph nodes: Secondary | ICD-10-CM | POA: Diagnosis not present

## 2018-01-28 DIAGNOSIS — I2510 Atherosclerotic heart disease of native coronary artery without angina pectoris: Secondary | ICD-10-CM | POA: Diagnosis not present

## 2018-01-28 DIAGNOSIS — K769 Liver disease, unspecified: Secondary | ICD-10-CM | POA: Insufficient documentation

## 2018-01-28 DIAGNOSIS — I700 Atherosclerosis of aorta: Secondary | ICD-10-CM | POA: Insufficient documentation

## 2018-01-28 DIAGNOSIS — R160 Hepatomegaly, not elsewhere classified: Secondary | ICD-10-CM | POA: Diagnosis not present

## 2018-01-28 LAB — GLUCOSE, CAPILLARY: Glucose-Capillary: 132 mg/dL — ABNORMAL HIGH (ref 70–99)

## 2018-01-28 MED ORDER — FLUDEOXYGLUCOSE F - 18 (FDG) INJECTION
10.2200 | Freq: Once | INTRAVENOUS | Status: AC | PRN
  Administered 2018-01-28: 10.22 via INTRAVENOUS

## 2018-01-29 ENCOUNTER — Inpatient Hospital Stay (HOSPITAL_BASED_OUTPATIENT_CLINIC_OR_DEPARTMENT_OTHER): Payer: No Typology Code available for payment source | Admitting: Hematology and Oncology

## 2018-01-29 ENCOUNTER — Encounter: Payer: Self-pay | Admitting: Hematology and Oncology

## 2018-01-29 VITALS — BP 131/81 | HR 87 | Temp 96.7°F | Resp 18

## 2018-01-29 DIAGNOSIS — N183 Chronic kidney disease, stage 3 (moderate): Secondary | ICD-10-CM | POA: Diagnosis not present

## 2018-01-29 DIAGNOSIS — D509 Iron deficiency anemia, unspecified: Secondary | ICD-10-CM

## 2018-01-29 DIAGNOSIS — J449 Chronic obstructive pulmonary disease, unspecified: Secondary | ICD-10-CM

## 2018-01-29 DIAGNOSIS — K769 Liver disease, unspecified: Secondary | ICD-10-CM | POA: Diagnosis not present

## 2018-01-29 NOTE — Progress Notes (Signed)
Patient here today for PET results. 

## 2018-01-29 NOTE — Progress Notes (Addendum)
[facility] day:  01/29/2018   Chief Complaint: Jimmy Hill. is a 77 y.o. male with liver lesions and iron deficiency anemia who is seen for review of work-up and discussion regarding direction of therapy.  HPI:  The patient was last seen in the medical oncology clinic on 01/22/2018 for initial consultation.  He had liver lesions of unclear etiology.  Work-up on 01/22/2018 revealed a hematocrit of 36.8, hemoglobin 11.6, MCV 79.0, platelets 205,000, and WBC 7200.  CA19-9 was 253 (0-35).  CEA was 8.3.  AFP was 13.4.  Ferritin was 12.  Iron saturation was 9% with a TIBC of 360.  Albumin was 3.3.  Retic was 1.9%.  Normal studies included:  hepatitis B core antibody total, hepatitis C antibody, folate, B12, PT was 14.1 with an INR of 1.10.  PTT was 32.  He was instructed to begin oral iron with vitamin C. He began oral iron on 01/23/2018.  PET scan on 01/28/2018 revealed hypermetabolism associated with a large ill-defined left hepatic lobe mass (SUV 7.0) as well as periportal/gastrohepatic ligament adenopathy (up to 1.5 cm: SUV 4.3).  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  There was aortic atherosclerosis, three-vessel coronary artery calcification and bilateral renal stones.  During the interim, he denies any new complaints.  He notes lower back discomfort.  He is taking oral iron every other day.   Past Medical History:  Diagnosis Date  . Allergic rhinitis   . Anginal pain ([place])   . Arthritis   . Autonomic dysreflexia   . Back pain    CHRONIC LBP,STENOSIS  . CHF (congestive heart failure) ([place])   . Chronic kidney disease    STAGE 3  . COPD (chronic obstructive pulmonary disease) ([place])   . Coronary artery disease   . Depression   . Diabetes ([place])   . Diabetes mellitus without complication ([place])   . DJD (degenerative joint  disease)   . Dysphagia   . Dysrhythmia    A-fib, now converted  . Edema    FEET/ANKLES  . GERD (gastroesophageal reflux disease)   . Glaucoma   . H/O orthostatic hypotension   . Hypercholesterolemia   . Myocardial infarction ([place]) 2000  . Parkinson's disease ([place])   . Peripheral vascular disease ([place])   . Pneumonia    history of   . Polyneuropathy   . RLS (restless legs syndrome)   . Seizures ([place])    when in rehab for his hip  . Sleep apnea   . Smoker   . Spinal stenosis   . Strabismic amblyopia   . Tinnitus     Past Surgical History:  Procedure Laterality Date  . ANKLE SURGERY    . APPENDECTOMY    . BACK SURGERY    . CATARACT EXTRACTION W/PHACO Left 08/26/2016   Procedure: CATARACT EXTRACTION PHACO AND INTRAOCULAR LENS PLACEMENT (IOC);  Surgeon: Birder Robson, MD;  Location: [facility] ORS;  Service: Ophthalmology;  Laterality: Left;  [place] 01:37 AP% 19.4 CDE 18.97 fluid pack lot # [id number]  . CORONARY ANGIOPLASTY     STENT  . CTR    . ESOPHAGOGASTRODUODENOSCOPY (EGD) WITH PROPOFOL N/A 03/04/2016   Procedure: ESOPHAGOGASTRODUODENOSCOPY (EGD) WITH PROPOFOL;  Surgeon: [name], MD;  Location: [facility] ENDOSCOPY;  Service: Endoscopy;  Laterality: N/A;  . EYE SURGERY     cataract  . FRACTURE SURGERY    .  HIP PINNING,CANNULATED Left 12/04/2015   Procedure: CANNULATED HIP PINNING;  Surgeon: Thornton Park, MD;  Location: [facility] ORS;  Service: Orthopedics;  Laterality: Left;    Family History  Problem Relation Age of Onset  . Heart disease Father   . Diabetes Mellitus II Other     Social History:  reports that he quit smoking about 2 years ago. His smoking use included cigarettes. He smoked 0.50 packs per day. He has never used smokeless tobacco. He reports that he does not drink alcohol or use drugs.  He smoked 2 packs/week x 60 years.  He is unaware of any exposures to radiation or toxins.  He is a retired Production designer, theatre/television/film.  He lives alone.  He has a wheelchair at  his house (Parkinson's, diabetes with neuropathy).  He is able to perform is ADLs.  He has aides come out during the day.  His wife has dementia and lives in the [facility].  Daughter is [name]. The patient is accompanied by his aide, Willette Cluster, today.   Allergies:  Allergies  Allergen Reactions  . Bupropion Other (See Comments)    Seizure  . Cheese     Patient doesn't want any cheese  . Sulfamethoxazole-Trimethoprim Other (See Comments)    Current Medications: Current Outpatient Medications  Medication Sig Dispense Refill  . albuterol (PROVENTIL) (2.5 MG/3ML) 0.083% nebulizer solution Take 2.5 mg by nebulization 4 (four) times daily as needed for wheezing or shortness of breath.    Marland Kitchen amiodarone (PACERONE) 100 MG tablet Take 100 mg by mouth daily.    Marland Kitchen apixaban (ELIQUIS) 5 MG TABS tablet Take by mouth.    Marland Kitchen atorvastatin (LIPITOR) 80 MG tablet Take 80 mg by mouth at bedtime.    . brimonidine-timolol (COMBIGAN) 0.2-0.5 % ophthalmic solution Place 1 drop into both eyes 2 (two) times daily.    . carbidopa-levodopa (SINEMET IR) 25-250 MG tablet Take 2 tablets by mouth every morning (8AM), 1 tablet at noon, 1 tablet every afternoon (430PM) and 1 tablet at bedtime (10PM)    . Carboxymethylcellul-Glycerin (REFRESH OPTIVE OP) Apply 2 drops to eye every 8 (eight) hours as needed (dry eyes).    . Dextromethorphan-Guaifenesin (TUSSIN DM) 10-100 MG/5ML liquid Take 5 mLs by mouth every 6 (six) hours as needed (cough).     Marland Kitchen dextrose (GLUTOSE 15) 40 % GEL Take 1 Tube by mouth. For blood sugar less than 70, repeat in 5 minutes up to 2 doses     . insulin glargine (LANTUS) 100 UNIT/ML injection Inject 14 Units into the skin every morning.     . magnesium oxide (MAG-OX) 400 MG tablet Take 400 mg by mouth daily.    . metFORMIN (GLUCOPHAGE) 1000 MG tablet Take 1,000 mg by mouth 2 (two) times daily with a meal.     . metoprolol succinate (TOPROL-XL) 25 MG 24 hr tablet Take 25 mg by mouth daily.    .  nitroGLYCERIN (NITROSTAT) 0.4 MG SL tablet Place 0.4 mg under the tongue every 5 (five) minutes as needed for chest pain.    . potassium chloride (K-DUR,KLOR-CON) 10 MEQ tablet Take 20 mEq by mouth daily.    . predniSONE (DELTASONE) 10 MG tablet Take 5 tablets (50 mg total) by mouth daily with breakfast. Take 50 mg daily taper by 10 mg daily then stop 15 tablet 0  . traMADol (ULTRAM) 50 MG tablet Take 50 mg by mouth every 6 (six) hours as needed for moderate pain.    Marland Kitchen  pregabalin (LYRICA) 50 MG capsule Take 50 mg by mouth 2 (two) times daily.     No current facility-administered medications for this visit.     Review of Systems:  GENERAL:  Feels "the same".  No fevers, sweats or weight loss. 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  No runny nose, sore throat, mouth sores or tenderness. Lungs: No shortness of breath.  Cough.  No hemoptysis. Cardiac:  No chest pain, palpitations, orthopnea, or PND. GI:  No nausea, vomiting, diarrhea, constipation, melena or hematochezia. GU:  No urgency, frequency, dysuria, or hematuria. Musculoskeletal:  Bones and joints ache.  No muscle tenderness. Extremities:  No pain or swelling. Skin:  No rashes or skin changes. Neuro:  No headache, numbness or weakness, balance or coordination issues. Endocrine:  Diabetes.  No thyroid issues, hot flashes or night sweats. Psych:  No mood changes, depression or anxiety. Pain:  No focal pain. Review of systems:  All other systems reviewed and found to be negative.   Physical Exam: Blood pressure 131/81, pulse 87, temperature (!) 96.7 F (35.9 C), temperature source Tympanic, resp. rate 18. GENERAL:  Elderly gentleman sitting comfortably in a wheelchair in the exam room in no acute distress. MENTAL STATUS:  Alert and oriented to person, place and time. HEAD:  Pearline Cables hair.  Scruffy beard.  Normocephalic, atraumatic, face symmetric, no Cushingoid features. EYES:   Blue eyes.  No conjunctivitis or scleral icterus. ENT:  [place] in place.  EXTREMITIES: Nicotine stained nails.  No edema, no skin discoloration. NEUROLOGICAL: Unremarkable. PSYCH:  Appropriate.    Hospital Outpatient Visit on 01/28/2018  Component Date Value Ref Range Status  . Glucose-Capillary 01/28/2018 132* 70 - 99 mg/dL Final    Assessment:  Bryar Dahms. is a 77 y.o. male with liver lesions of unclear etiology.  He has a > 20 pack year smoking history.  He denies any history of hepatitis.    MRI abdomen without contrast on 01/18/2018 revealed significant enlargement of a lateral segment left liver lobe lesion (4.1 x 3.2 cm). There was new adjacent 3.6 x 3.3 cm gastrohepatic ligament adenopathy.  Differential considerations include hepatocellular carcinoma or metastatic disease from an unknown primary. There were smaller bilateral liver lesions which are also suspicious for hepatocellular carcinomas or metastatic disease.  PET scan on 01/28/2018 revealed hypermetabolism associated with a large ill-defined left hepatic lobe mass (SUV 7.0) as well as periportal/gastrohepatic ligament adenopathy (up to 1.5 cm: SUV 4.3).  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  There was aortic atherosclerosis, three-vessel coronary artery calcification and bilateral renal stones.  CA19-9 was 253 (0-35).  CEA was 8.3.  AFP was 13.4.  Normal studies included:  hepatitis B core antibody total, hepatitis C antibody.  He has iron deficiency anemia.  Labs on 01/22/2018 revealed a ferritin 12 and an iron saturation of 9%. B12 and folate were normal.  Labs on 11/26/2017 revealed a normal creatinine and TSH.  Diet appears good.   EGD at [facility] on 04/17/2017 revealed diffuse atrophic mucosa in the entire stomach and multiple localized erosions without bleeding.  Pathology revealed foveolar hyperplasia c/w  reactive gastropathy.  Colonoscopy at [facility] on 04/17/2017 revealed two 5-7 mm polyps in the descending colon and transverse colon (adenomatous polyps).  Capsule study on 05/05/2017 revealed polyps x 2 in the proximal small bowel and AVMs  x 2 in the terminal ileum and proximal small bowel.  Plan was anterograde and retrograde balloon enteroscopy at [facility].  Symptomatically, he denies any new complaints.  Exam is stable.  Plan: 1.  Discuss laboratory work-up.  Anemia secondary to iron deficiency.  Elevated tumor markers (AFP, CEA, and CA19-9). 2.  Liver lesions:  Images personally reviewed.  Images reviewed with patient.  Agree with radiology interpretation.  Discuss plan for liver biopsy.  Discussed with radiology. 3.  Iron deficiency anemia:  Increase oral iron from QOD to daily with OJ or vitamin C.  If tolerating well, increase to BID.  Discuss plan for IV iron if does not improve. 4.  Schedule ultrasound guided liver biopsy. 5.  RTC 3 days after liver biopsy for MD assessment, review of pathology, and discussion regarding direction of therapy.  Addendum:  Patient on Eliquis.  Will find out if patient can come off Eliquis for procedure.   Honor Loh, NP  01/29/2018, 3:36 PM   I saw and evaluated the patient, participating in the key portions of the service and reviewing pertinent diagnostic studies and records.  I reviewed the nurse practitioner's note and agree with the findings and the plan.  The assessment and plan were discussed with the patient.  Several questions were asked by the patient and answered.   Nolon Stalls, MD 01/29/2018,3:36 PM

## 2018-02-01 ENCOUNTER — Other Ambulatory Visit: Payer: Self-pay | Admitting: Urgent Care

## 2018-02-01 ENCOUNTER — Telehealth: Payer: Self-pay | Admitting: *Deleted

## 2018-02-01 DIAGNOSIS — R932 Abnormal findings on diagnostic imaging of liver and biliary tract: Secondary | ICD-10-CM

## 2018-02-01 DIAGNOSIS — R160 Hepatomegaly, not elsewhere classified: Secondary | ICD-10-CM

## 2018-02-01 NOTE — Telephone Encounter (Signed)
Dr Manson Allan is no longer with [facility].  Patient has not been seen @ [facility] in a very long time.  I have contacted patient to inquire who his PCP is and that he is taking Eliquis.  Explained that we need this information to finalize his liver biopsy.  Requested call back asap.

## 2018-02-01 NOTE — Telephone Encounter (Signed)
-----   Message from Karen Kitchens, NP sent at 01/31/2018  9:25 PM EDT ----- Need medical clearance from Dr. Leticia Penna to hold patient's Eliquis for the ordered liver biopsy.  Jimmy Hill

## 2018-02-02 ENCOUNTER — Telehealth: Payer: Self-pay | Admitting: *Deleted

## 2018-02-02 NOTE — Telephone Encounter (Signed)
Entered in error

## 2018-02-02 NOTE — Telephone Encounter (Signed)
Called yesterday and left Dr. Minette Headland @ [facility] a message that patient is scheduled for liver biopsy, and asked for clearance for patient to be off hie Eliquis for the procedure.  He called back this morning to inform me that patient has been off Eliquis since December 2018.  He would like to know when the biopsy is scheduled as [facility] will provide transportation for the patient to this procedure.  Office # [phone] or his cell # [phone].

## 2018-02-02 NOTE — Telephone Encounter (Signed)
Called patient and LVM that his liver biopsy has been scheduled for Tuesday, September 24.  Patient should arrive @ [place] @ 9 AM for 10 AM procedure.  Asked patient to call me back to confirm he got this message.

## 2018-02-02 NOTE — Telephone Encounter (Signed)
Called and spoke with Dr. Minette Headland to inform him that patient's liver biopsy has been scheduled for Tuesday, September 24. [facility] @ 9 AM for 10 AM procedure.

## 2018-02-08 ENCOUNTER — Other Ambulatory Visit: Payer: Self-pay | Admitting: Physician Assistant

## 2018-02-09 ENCOUNTER — Ambulatory Visit
Admission: RE | Admit: 2018-02-09 | Discharge: 2018-02-09 | Disposition: A | Discharge status: Home / Self Care | Payer: No Typology Code available for payment source | Source: Ambulatory Visit | Attending: Urgent Care | Admitting: Urgent Care

## 2018-02-09 DIAGNOSIS — R160 Hepatomegaly, not elsewhere classified: Secondary | ICD-10-CM | POA: Insufficient documentation

## 2018-02-09 DIAGNOSIS — I252 Old myocardial infarction: Secondary | ICD-10-CM | POA: Diagnosis not present

## 2018-02-09 DIAGNOSIS — I700 Atherosclerosis of aorta: Secondary | ICD-10-CM | POA: Insufficient documentation

## 2018-02-09 DIAGNOSIS — E1140 Type 2 diabetes mellitus with diabetic neuropathy, unspecified: Secondary | ICD-10-CM | POA: Insufficient documentation

## 2018-02-09 DIAGNOSIS — I2510 Atherosclerotic heart disease of native coronary artery without angina pectoris: Secondary | ICD-10-CM | POA: Diagnosis not present

## 2018-02-09 DIAGNOSIS — Z87891 Personal history of nicotine dependence: Secondary | ICD-10-CM | POA: Diagnosis not present

## 2018-02-09 DIAGNOSIS — J449 Chronic obstructive pulmonary disease, unspecified: Secondary | ICD-10-CM | POA: Diagnosis not present

## 2018-02-09 DIAGNOSIS — I509 Heart failure, unspecified: Secondary | ICD-10-CM | POA: Insufficient documentation

## 2018-02-09 DIAGNOSIS — K769 Liver disease, unspecified: Secondary | ICD-10-CM | POA: Diagnosis not present

## 2018-02-09 DIAGNOSIS — Z794 Long term (current) use of insulin: Secondary | ICD-10-CM | POA: Insufficient documentation

## 2018-02-09 DIAGNOSIS — E1122 Type 2 diabetes mellitus with diabetic chronic kidney disease: Secondary | ICD-10-CM | POA: Diagnosis not present

## 2018-02-09 DIAGNOSIS — Z9889 Other specified postprocedural states: Secondary | ICD-10-CM | POA: Diagnosis not present

## 2018-02-09 DIAGNOSIS — Z91018 Allergy to other foods: Secondary | ICD-10-CM | POA: Diagnosis not present

## 2018-02-09 DIAGNOSIS — N183 Chronic kidney disease, stage 3 (moderate): Secondary | ICD-10-CM | POA: Insufficient documentation

## 2018-02-09 DIAGNOSIS — Z8249 Family history of ischemic heart disease and other diseases of the circulatory system: Secondary | ICD-10-CM | POA: Insufficient documentation

## 2018-02-09 DIAGNOSIS — Z882 Allergy status to sulfonamides status: Secondary | ICD-10-CM | POA: Diagnosis not present

## 2018-02-09 DIAGNOSIS — G20 Parkinson's disease: Secondary | ICD-10-CM | POA: Diagnosis not present

## 2018-02-09 DIAGNOSIS — Z7901 Long term (current) use of anticoagulants: Secondary | ICD-10-CM | POA: Diagnosis not present

## 2018-02-09 DIAGNOSIS — E1151 Type 2 diabetes mellitus with diabetic peripheral angiopathy without gangrene: Secondary | ICD-10-CM | POA: Insufficient documentation

## 2018-02-09 DIAGNOSIS — Z79899 Other long term (current) drug therapy: Secondary | ICD-10-CM | POA: Diagnosis not present

## 2018-02-09 DIAGNOSIS — E7800 Pure hypercholesterolemia, unspecified: Secondary | ICD-10-CM | POA: Diagnosis not present

## 2018-02-09 DIAGNOSIS — R932 Abnormal findings on diagnostic imaging of liver and biliary tract: Secondary | ICD-10-CM | POA: Diagnosis present

## 2018-02-09 DIAGNOSIS — Z833 Family history of diabetes mellitus: Secondary | ICD-10-CM | POA: Insufficient documentation

## 2018-02-09 DIAGNOSIS — Z888 Allergy status to other drugs, medicaments and biological substances status: Secondary | ICD-10-CM | POA: Insufficient documentation

## 2018-02-09 DIAGNOSIS — G4730 Sleep apnea, unspecified: Secondary | ICD-10-CM | POA: Diagnosis not present

## 2018-02-09 DIAGNOSIS — N200 Calculus of kidney: Secondary | ICD-10-CM | POA: Insufficient documentation

## 2018-02-09 DIAGNOSIS — M1990 Unspecified osteoarthritis, unspecified site: Secondary | ICD-10-CM | POA: Insufficient documentation

## 2018-02-09 LAB — CBC
HCT: 39.3 % — ABNORMAL LOW (ref 40.0–52.0)
Hemoglobin: 12.8 g/dL — ABNORMAL LOW (ref 13.0–18.0)
MCH: 26.3 pg (ref 26.0–34.0)
MCHC: 32.6 g/dL (ref 32.0–36.0)
MCV: 80.6 fL (ref 80.0–100.0)
PLATELETS: 222 10*3/uL (ref 150–440)
RBC: 4.88 MIL/uL (ref 4.40–5.90)
RDW: 20 % — ABNORMAL HIGH (ref 11.5–14.5)
WBC: 10.5 10*3/uL (ref 3.8–10.6)

## 2018-02-09 LAB — PROTIME-INR
INR: 1.09
PROTHROMBIN TIME: 14 s (ref 11.4–15.2)

## 2018-02-09 LAB — APTT: aPTT: 34 seconds (ref 24–36)

## 2018-02-09 LAB — GLUCOSE, CAPILLARY: Glucose-Capillary: 127 mg/dL — ABNORMAL HIGH (ref 70–99)

## 2018-02-09 MED ORDER — SODIUM CHLORIDE 0.9 % IV SOLN
INTRAVENOUS | Status: DC
  Administered 2018-02-09: 10:00:00 via INTRAVENOUS

## 2018-02-09 MED ORDER — MIDAZOLAM HCL 2 MG/2ML IJ SOLN
INTRAMUSCULAR | Status: AC
  Filled 2018-02-09: qty 2

## 2018-02-09 MED ORDER — LIDOCAINE HCL (PF) 1 % IJ SOLN
INTRAMUSCULAR | Status: AC | PRN
  Administered 2018-02-09: 5 mL

## 2018-02-09 MED ORDER — MIDAZOLAM HCL 2 MG/2ML IJ SOLN
INTRAMUSCULAR | Status: AC | PRN
  Administered 2018-02-09: 1 mg via INTRAVENOUS

## 2018-02-09 MED ORDER — FENTANYL CITRATE (PF) 100 MCG/2ML IJ SOLN
INTRAMUSCULAR | Status: AC
  Filled 2018-02-09: qty 2

## 2018-02-09 MED ORDER — FENTANYL CITRATE (PF) 100 MCG/2ML IJ SOLN
INTRAMUSCULAR | Status: AC | PRN
  Administered 2018-02-09: 25 ug via INTRAVENOUS

## 2018-02-09 NOTE — Progress Notes (Signed)
DC instructions reviewed with pt. And his MD escort from [place], Dr. Demetrio Lapping. Both verbalize understanding of DC instructions. [facility] called for ride for pt. _V.Scott, RN spoke with [facility] for 2 PM pickup.

## 2018-02-09 NOTE — Discharge Instructions (Signed)
Liver Biopsy, Care After °These instructions give you information on caring for yourself after your procedure. Your doctor may also give you more specific instructions. Call your doctor if you have any problems or questions after your procedure. °Follow these instructions at home: °· Rest at home for 1-2 days or as told by your doctor. °· Have someone stay with you for at least 24 hours. °· Do not do these things in the first 24 hours: °? Drive. °? Use machinery. °? Take care of other people. °? Sign legal documents. °? Take a bath or shower. °· There are many different ways to close and cover a cut (incision). For example, a cut can be closed with stitches, skin glue, or adhesive strips. Follow your doctor's instructions on: °? Taking care of your cut. °? Changing and removing your bandage (dressing). °? Removing whatever was used to close your cut. °· Do not drink alcohol in the first week. °· Do not lift more than 5 pounds or play contact sports for the first 2 weeks. °· Take medicines only as told by your doctor. For 1 week, do not take medicine that has aspirin in it or medicines like ibuprofen. °· Get your test results. °Contact a doctor if: °· A cut bleeds and leaves more than just a small spot of blood. °· A cut is red, puffs up (swells), or hurts more than before. °· Fluid or something else comes from a cut. °· A cut smells bad. °· You have a fever or chills. °Get help right away if: °· You have swelling, bloating, or pain in your belly (abdomen). °· You get dizzy or faint. °· You have a rash. °· You feel sick to your stomach (nauseous) or throw up (vomit). °· You have trouble breathing, feel short of breath, or feel faint. °· Your chest hurts. °· You have problems talking or seeing. °· You have trouble balancing or moving your arms or legs. °This information is not intended to replace advice given to you by your health care provider. Make sure you discuss any questions you have with your health care  provider. °Document Released: 02/12/2008 Document Revised: 10/11/2015 Document Reviewed: 07/01/2013 °Elsevier Interactive Patient Education © 2018 [employer] ° °

## 2018-02-09 NOTE — Procedures (Signed)
L lobe liver lesion core Bx 18 g EBL 0 Comp 0

## 2018-02-09 NOTE — H&P (Signed)
Chief Complaint: Patient was seen in consultation today for liver lesion  Referring Physician(s): [name]  Supervising Physician: Marybelle Killings  Patient Status: [facility] - Out-pt  History of Present Illness: Jimmy Hill. is a 77 y.o. male with past medical history of CHF, COPD, CKD stage 3, CAD, DM, and Parkinson's disease who presents to [facility] with enlarging liver lesions.  Liver lesions were first identified by CT Abdomen/Pelvis 05/02/2017 and demonstrated enlargement on subsequent scan obtained 01/18/18 and a PET scan 01/28/18 showed hypermetabolic activity.   Patient referred to IR for liver lesion biopsy at the request of Dr. Meredith Staggers with [facility].  Case reviewed and approved by Dr. Kathlene Cote.   Patient presents today in his usual state of health.  He is accompanied by his [facility] MD who states patient is himself today and verifies patient has followed all instructions for undergoing biopsy today.  He has been NPO.  He has not been on Eliquis for several months.   Past Medical History:  Diagnosis Date  . Allergic rhinitis   . Anginal pain ([place])   . Arthritis   . Autonomic dysreflexia   . Back pain    CHRONIC LBP,STENOSIS  . CHF (congestive heart failure) ([place])   . Chronic kidney disease    STAGE 3  . COPD (chronic obstructive pulmonary disease) ([place])   . Coronary artery disease   . Depression   . Diabetes ([place])   . Diabetes mellitus without complication ([place])   . DJD (degenerative joint disease)   . Dysphagia   . Dysrhythmia    A-fib, now converted  . Edema    FEET/ANKLES  . GERD (gastroesophageal reflux disease)   . Glaucoma   . H/O orthostatic hypotension   . Hypercholesterolemia   . Myocardial infarction ([place]) 2000  . Parkinson's disease ([place])   . Peripheral vascular disease ([place])   . Pneumonia    history of   . Polyneuropathy   . RLS (restless legs syndrome)   . Seizures ([place])    when in rehab for his hip  . Sleep apnea    . Smoker   . Spinal stenosis   . Strabismic amblyopia   . Tinnitus     Past Surgical History:  Procedure Laterality Date  . ANKLE SURGERY    . APPENDECTOMY    . BACK SURGERY    . CATARACT EXTRACTION W/PHACO Left 08/26/2016   Procedure: CATARACT EXTRACTION PHACO AND INTRAOCULAR LENS PLACEMENT (IOC);  Surgeon: Birder Robson, MD;  Location: [facility] ORS;  Service: Ophthalmology;  Laterality: Left;  [place] 01:37 AP% 19.4 CDE 18.97 fluid pack lot # [id number]  . CORONARY ANGIOPLASTY     STENT  . CTR    . ESOPHAGOGASTRODUODENOSCOPY (EGD) WITH PROPOFOL N/A 03/04/2016   Procedure: ESOPHAGOGASTRODUODENOSCOPY (EGD) WITH PROPOFOL;  Surgeon: [name], MD;  Location: [facility] ENDOSCOPY;  Service: Endoscopy;  Laterality: N/A;  . EYE SURGERY     cataract  . FRACTURE SURGERY    . HIP PINNING,CANNULATED Left 12/04/2015   Procedure: CANNULATED HIP PINNING;  Surgeon: Thornton Park, MD;  Location: [facility] ORS;  Service: Orthopedics;  Laterality: Left;    Allergies: Bupropion; Cheese; and Sulfamethoxazole-trimethoprim  Medications: Prior to Admission medications   Medication Sig Start Date End Date Taking? Authorizing Provider  albuterol (PROVENTIL) (2.5 MG/3ML) 0.083% nebulizer solution Take 2.5 mg by nebulization 4 (four) times daily as needed for wheezing or shortness of breath.   Yes [provider]  amiodarone (PACERONE)  100 MG tablet Take 100 mg by mouth daily.   Yes [provider]  atorvastatin (LIPITOR) 80 MG tablet Take 80 mg by mouth at bedtime.   Yes [provider]  brimonidine-timolol (COMBIGAN) 0.2-0.5 % ophthalmic solution Place 1 drop into both eyes 2 (two) times daily.   Yes [provider]  carbidopa-levodopa (SINEMET IR) 25-250 MG tablet Take 2 tablets by mouth every morning (8AM), 1 tablet at noon, 1 tablet every afternoon (430PM) and 1 tablet at bedtime (10PM)   Yes [provider]  Carboxymethylcellul-Glycerin (REFRESH OPTIVE OP) Apply 2  drops to eye every 8 (eight) hours as needed (dry eyes).   Yes [provider]  Dextromethorphan-Guaifenesin (TUSSIN DM) 10-100 MG/5ML liquid Take 5 mLs by mouth every 6 (six) hours as needed (cough).    Yes [provider]  insulin glargine (LANTUS) 100 UNIT/ML injection Inject 14 Units into the skin every morning.    Yes Gareth Morgan, MD  magnesium oxide (MAG-OX) 400 MG tablet Take 400 mg by mouth daily.   Yes [provider]  metFORMIN (GLUCOPHAGE) 1000 MG tablet Take 1,000 mg by mouth 2 (two) times daily with a meal.    Yes Gareth Morgan, MD  metoprolol succinate (TOPROL-XL) 25 MG 24 hr tablet Take 25 mg by mouth daily.   Yes [provider]  potassium chloride (K-DUR,KLOR-CON) 10 MEQ tablet Take 20 mEq by mouth daily.   Yes [provider]  apixaban (ELIQUIS) 5 MG TABS tablet Take by mouth.    [provider]  dextrose (GLUTOSE 15) 40 % GEL Take 1 Tube by mouth. For blood sugar less than 70, repeat in 5 minutes up to 2 doses     [provider]  nitroGLYCERIN (NITROSTAT) 0.4 MG SL tablet Place 0.4 mg under the tongue every 5 (five) minutes as needed for chest pain.    [provider]  predniSONE (DELTASONE) 10 MG tablet Take 5 tablets (50 mg total) by mouth daily with breakfast. Take 50 mg daily taper by 10 mg daily then stop Patient not taking: Reported on 02/09/2018 10/15/17   Fritzi Mandes, MD  pregabalin (LYRICA) 50 MG capsule Take 50 mg by mouth 2 (two) times daily. 01/26/17 01/26/18  [provider]  traMADol (ULTRAM) 50 MG tablet Take 50 mg by mouth every 6 (six) hours as needed for moderate pain.    [provider]     Family History  Problem Relation Age of Onset  . Heart disease Father   . Diabetes Mellitus II Other     Social History   Socioeconomic History  . Marital status: Married    Spouse name: Not on file  . Number of children: Not on file  . Years of education: Not on file  .  Highest education level: Not on file  Occupational History  . Occupation: retired  Scientific laboratory technician  . Financial resource strain: Not on file  . Food insecurity:    Worry: Not on file    Inability: Not on file  . Transportation needs:    Medical: Not on file    Non-medical: Not on file  Tobacco Use  . Smoking status: Former Smoker    Packs/day: 0.50    Types: Cigarettes    Last attempt to quit: 11/17/2015    Years since quitting: 2.2  . Smokeless tobacco: Never Used  Substance and Sexual Activity  . Alcohol use: No  . Drug use: No  . Sexual  activity: Never  Lifestyle  . Physical activity:    Days per week: Not on file    Minutes per session: Not on file  . Stress: Not on file  Relationships  . Social connections:    Talks on phone: Not on file    Gets together: Not on file    Attends religious service: Not on file    Active member of club or organization: Not on file    Attends meetings of clubs or organizations: Not on file    Relationship status: Not on file  Other Topics Concern  . Not on file  Social History Narrative  . Not on file     Review of Systems: A 12 point ROS discussed and pertinent positives are indicated in the HPI above.  All other systems are negative.  Review of Systems  Constitutional: Negative for fatigue and fever.  Respiratory: Negative for cough and shortness of breath.   Cardiovascular: Negative for chest pain.  Gastrointestinal: Negative for abdominal pain.  Genitourinary: Negative for dysuria and flank pain.  Musculoskeletal: Negative for back pain.  Hematological: Negative for adenopathy.  Psychiatric/Behavioral: Negative for behavioral problems and confusion.    Vital Signs: BP 101/61   Pulse 73   Temp 97.8 F (36.6 C) (Oral)   Resp 16   Ht 5\' 8"  (1.727 m)   Wt 197 lb (89.4 kg)   SpO2 90%   BMI 29.95 kg/m   Physical Exam  Constitutional: He is oriented to person, place, and time. He appears well-developed. No distress.    Cardiovascular: Normal rate, regular rhythm and normal heart sounds. Exam reveals no gallop and no friction rub.  No murmur heard. Pulmonary/Chest: Effort normal and breath sounds normal. No respiratory distress.  Abdominal: Soft. Bowel sounds are normal. He exhibits no distension. There is no tenderness.  Neurological: He is alert and oriented to person, place, and time.  Skin: Skin is warm and dry. He is not diaphoretic.  Psychiatric: He has a normal mood and affect. His behavior is normal. Judgment and thought content normal.  Nursing note and vitals reviewed.    MD Evaluation Airway: WNL Heart: WNL Abdomen: WNL Chest/ Lungs: WNL ASA  Classification: 3 Mallampati/Airway Score: Two   Imaging: Mr Abdomen Wo Contrast  Result Date: 01/19/2018 CLINICAL DATA:  Nausea. Liver lesion. Prior history of gastritis, COPD. Type 2 diabetes. EXAM: MRI ABDOMEN WITHOUT CONTRAST TECHNIQUE: Multiplanar multisequence MR imaging was performed without the administration of intravenous contrast. COMPARISON:  abdominopelvic CT 05/02/2017. FINDINGS: Mild degradation secondary to motion and lack of IV contrast. Lower chest: Normal heart size without pericardial or pleural effusion. Probable atelectasis or scar at the right lung base. Hepatobiliary: Mild caudate lobe enlargement. There is relatively diffuse mild T2 hyperintensity involving the lateral segment left liver lobe, including on image 10/7. More focal masslike component within the posterior lateral portion of the segment measures 4.1 x 3.2 cm on image 39/7. Vague T2 hyperintense segment 4A lesion on image 13/10. A 1.8 cm subtle T2 hyperintense right hepatic lobe lesion on image 16/7 corresponds to hyperintensity on diffusion-weighted image 17/9. Other smaller liver lesions including within the right hepatic lobe on images 22 and 24/series 7 are too small to characterize. Normal gallbladder, without biliary ductal dilatation. Pancreas:  Normal, without mass  or ductal dilatation. Spleen:  Normal in size, without focal abnormality. Adrenals/Urinary Tract: Normal adrenal glands. Bilateral too small to characterize renal lesions which are likely cysts. A dominant anterior inter/lower pole cyst measures  4.2 cm. No hydronephrosis. Stomach/Bowel: Normal stomach and abdominal bowel loops. Vascular/Lymphatic: Normal aortic caliber. Development of gastrohepatic ligament adenopathy at 3.6 x 3.3 cm on image 13/7. Other: No significant free fluid. No evidence of omental or peritoneal disease. Musculoskeletal: Lower lumbar spine fixation. IMPRESSION: 1. Mild degradation secondary to motion and lack of IV contrast. 2. Significant enlargement of a lateral segment left liver lobe lesion. Given adjacent new gastrohepatic ligament adenopathy, differential considerations include hepatocellular carcinoma or metastatic disease from an unknown primary. Correlate with risk factors for cirrhosis. Consider tissue sampling of the lateral segment left liver lobe versus further evaluation with PET to direct tissue sampling. 3. Smaller bilateral liver lesions which are also suspicious for hepatocellular carcinomas or metastatic disease. These results will be called to the ordering clinician or representative by the Radiologist Assistant, and communication documented in the PACS or zVision Dashboard. Electronically Signed   By: Abigail Miyamoto M.D.   On: 01/19/2018 11:46   Nm Pet Image Initial (pi) Skull Base To Thigh  Result Date: 01/28/2018 CLINICAL DATA:  Initial treatment strategy for liver lesion. EXAM: NUCLEAR MEDICINE PET SKULL BASE TO THIGH TECHNIQUE: 10.2 mCi F-18 FDG was injected intravenously. Full-ring PET imaging was performed from the skull base to thigh after the radiotracer. CT data was obtained and used for attenuation correction and anatomic localization. Fasting blood glucose: 132 mg/dl COMPARISON:  MR abdomen 01/19/2018 and CT abdomen pelvis 05/02/2017. FINDINGS: Mediastinal  blood pool activity: SUV max 2.6 NECK: No abnormal hypermetabolism. Incidental CT findings: None. CHEST: No abnormal hypermetabolism. Lymph nodes adjacent to the distal esophagus and at the diaphragmatic hiatus do not show metabolism above blood pool. Incidental CT findings: Atherosclerotic calcification of the arterial vasculature, including three-vessel involvement of the coronary arteries and aortic valve. Heart is at the upper limits of normal in size. No pericardial or pleural effusion. ABDOMEN/PELVIS: There is a large area ill-defined heterogeneous low-attenuation in the left hepatic lobe, difficult to measure without IV contrast, with a corresponding SUV max of 7.0. Hypermetabolic periportal and gastro hepatic ligament lymph nodes measure up to 1.5 cm and SUV max 4.3. No abnormal hypermetabolism in the adrenal glands, spleen or pancreas. Incidental CT findings: Liver is otherwise unremarkable. There may be sludge in the gallbladder. Adrenal glands are unremarkable. Stones in the kidneys bilaterally. 4.8 cm low-attenuation lesion off the interpolar right kidney, characterized as a cyst on 01/19/2018. Additional smaller low-attenuation lesions are too small to characterize. Spleen, pancreas, stomach and bowel are otherwise grossly unremarkable. No free fluid. Atherosclerotic calcification of the arterial vasculature without abdominal aortic aneurysm. SKELETON: No abnormal osseous hypermetabolism. Incidental CT findings: Postoperative changes in the proximal left femur and lumbar spine. Degenerative changes in the spine. IMPRESSION: 1. Hypermetabolism associated with an ill-defined left hepatic lobe mass as well as periportal/gastrohepatic ligament adenopathy. 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 2. Aortic atherosclerosis (ICD10-170.0). Three-vessel coronary artery calcification. 3. Bilateral  renal stones. Electronically Signed   By: Lorin Picket M.D.   On: 01/28/2018 14:13    Labs:  CBC: Recent Labs    09/23/17 0415 10/13/17 2237 01/22/18 1450 02/09/18 0948  WBC 6.1 11.4* 7.2 10.5  HGB 11.1* 10.2* 11.6* 12.8*  HCT 35.5* 33.5* 36.8* 39.3*  PLT 186 269 205 222    COAGS: Recent Labs    04/30/17 0422 05/01/17 1031 01/22/18 1450 02/09/18 0948  INR 1.65 1.24 1.10 1.09  APTT  --  35 32 34    BMP: Recent Labs    09/22/17 2153 09/23/17 0415 10/13/17 2237 01/22/18 1450  NA 139 141 139 138  K 3.2* 3.2* 4.1 3.9  CL 101 101 102 102  CO2 33* 29 27 22   GLUCOSE 170* 224* 177* 240*  BUN 11 12 15 12   CALCIUM 8.3* 8.3* 8.5* 8.4*  CREATININE 0.98 0.84 1.35* 1.04  GFRNONAA >60 >60 49* >60  GFRAA >60 >60 57* >60    LIVER FUNCTION TESTS: Recent Labs    05/02/17 1836 05/03/17 0420 05/04/17 0502 01/22/18 1450  BILITOT 1.2 1.1 0.8 0.6  AST 31 25 28 28   ALT 5* <5* 6* 9  ALKPHOS 48 50 64 69  PROT 5.3* 5.4* 5.6* 6.3*  ALBUMIN 2.5* 2.6* 2.7* 3.3*    TUMOR MARKERS: No results for input(s): AFPTM, CEA, CA199, CHROMGRNA in the last 8760 hours.  Assessment and Plan: Patient with past medical history of DM, CAD, COPD, CKD stage 3, Parkinson's presents with complaint of liver lesion.  IR consulted for liver lesion biopsy at the request of Dr. Meredith Staggers. Case reviewed by Dr. Kathlene Cote who approves patient for procedure.  Patient presents today in their usual state of health.  He has been NPO and is not currently on blood thinners.   Risks and benefits discussed with the patient including, but not limited to bleeding, infection, damage to adjacent structures or low yield requiring additional tests.  All of the patient's questions were answered, patient is agreeable to proceed. Consent signed and in chart.  Thank you for this interesting consult.  I greatly enjoyed meeting Jimmy Hill. and look forward to participating in their care.  A copy of this report  was sent to the requesting provider on this date.  Electronically Signed: Docia Barrier, PA 02/09/2018, 10:22 AM   I spent a total of  30 Minutes   in face to face in clinical consultation, greater than 50% of which was counseling/coordinating care for liver lesion.

## 2018-02-11 ENCOUNTER — Telehealth: Payer: Self-pay | Admitting: *Deleted

## 2018-02-11 NOTE — Telephone Encounter (Signed)
Patient called asking for results, I explained to him that the results are not back yet. He then proceeded to tell me that he is having pain in his right breast right at the nipple and he went to [facility] yesterday and they gave him Lidoderm patches which are not sticking to his skin due to excess hair. I explained to him that he needs to clip or use an electric razor to the area to get the patch to stick.. He said they didn't say "nothin" about his pain. He has no pain medications on hand. Doe she need to be seen in [facility]?

## 2018-02-11 NOTE — Telephone Encounter (Signed)
Per Gaspar Bidding "Verbal" on 02/11/18 to schedule patient to come in on 02/15/18  to see MD/ Liver Biopsy Results. Appt. was scheduled as requested. I called him and made him aware of the date and time of  his scheduled appt.

## 2018-02-11 NOTE — Telephone Encounter (Signed)
Patient does not have appointment Monday, Please schedule appointment and call him with time fo r9/30/19

## 2018-02-11 NOTE — Telephone Encounter (Signed)
No [place] visit. He should follow up with [place] on 02/15/2018 to review biopsy and plans. Will discuss pain at that time.   Gaspar Bidding

## 2018-02-12 ENCOUNTER — Encounter: Payer: Self-pay | Admitting: Urgent Care

## 2018-02-12 ENCOUNTER — Other Ambulatory Visit: Payer: Self-pay | Admitting: Hematology and Oncology

## 2018-02-12 DIAGNOSIS — C220 Liver cell carcinoma: Secondary | ICD-10-CM | POA: Insufficient documentation

## 2018-02-12 LAB — SURGICAL PATHOLOGY

## 2018-02-14 NOTE — Progress Notes (Signed)
[facility] day:  02/15/2018   Chief Complaint: Jimmy Hill. is a 77 y.o. male with liver lesions and iron deficiency anemia who is seen for review of liver biopsy and discussion regarding direction of therapy.  HPI:  The patient was last seen in the medical oncology clinic on 01/29/2018.  At that time, patient complained of discomfort in his lower back.  Patient was taking oral iron every other day.  Patient was encouraged to increase to daily with a source of vitamin C, and if tolerated, to twice a day.  Exam was stable.  Patient was scheduled for biopsy of suspicious hepatic lesions.  Patient underwent ultrasound-guided liver biopsy on 02/09/2018 performed by interventional radiologist, Dr. Marybelle Killings.  Procedure notes reviewed.  Procedure was uncomplicated.  Pathology results revealed adenocarcinoma.  Cytokeratin 7 (+), cytokeratin 20 (+), HepPar-1 (-), CD10 (-).  Differential diagnosis based on IHC staining includes primary intrahepatic cholangiocarcinoma and metastatic adenocarcinoma from gastric or pancreaticobiliary primary.  Patient was sent for Cancer TYPE ID (tumor of unknown origin), with genetic testing reflex, through [facility] on 02/12/2018. Resulting pending; no estimated result date provided.   Today, patient states, "I'm here". Patient is having more pain in his RIGHT breast. He has been seen by his PCP and prescribed lidocaine patches. Patient states, "they keep telling me it is a bruise, but I don't think it is". Pain is irrespective of breathing and movement. Patient notes that he feels like his RIGHT chest is swollen as compared contralaterally.   Patient denies that he has experienced any B symptoms. He denies any interval infections. Patient denies bleeding; no hematochezia, melena, or gross hematuria. Patient denies any increased shortness of breath. Patient advises that he maintains an adequate appetite. He is eating  well. Weight today is 192 lb (87.1 kg), which compared to his  visit to the clinic on 01/22/2018, represents a  5 pound decrease.   Patient's wife is currently residing at [facility]. Patient lives alone in his apartment. Patient has family and an aide from the [facility] coming in to help him with his daily care needs. He requires varying levels of assistance with his ADLs. He requires a wheelchair and supplemental oxygen. Patient has a Life Alert necklace to use when assistance is not readily available.   Patient complains of pain related 8/10 in the clinic today.   Past Medical History:  Diagnosis Date  . Allergic rhinitis   . Anginal pain ([place])   . Arthritis   . Autonomic dysreflexia   . Back pain    CHRONIC LBP,STENOSIS  . CHF (congestive heart failure) ([place])   . Chronic kidney disease    STAGE 3  . COPD (chronic obstructive pulmonary disease) ([place])   . Coronary artery disease   . Depression   . Diabetes ([place])   . Diabetes mellitus without complication ([place])   . DJD (degenerative joint disease)   . Dysphagia   . Dysrhythmia    A-fib, now converted  . Edema    FEET/ANKLES  . GERD (gastroesophageal reflux disease)   . Glaucoma   . H/O orthostatic hypotension   . Hypercholesterolemia   . Myocardial infarction ([place]) 2000  . Parkinson's disease ([place])   . Peripheral vascular disease ([place])   . Pneumonia    history of   . Polyneuropathy   . RLS (restless legs syndrome)   . Seizures ([place])    when in rehab for his hip  .  Sleep apnea   . Smoker   . Spinal stenosis   . Strabismic amblyopia   . Tinnitus     Past Surgical History:  Procedure Laterality Date  . ANKLE SURGERY    . APPENDECTOMY    . BACK SURGERY    . CATARACT EXTRACTION W/PHACO Left 08/26/2016   Procedure: CATARACT EXTRACTION PHACO AND INTRAOCULAR LENS PLACEMENT (IOC);  Surgeon: Birder Robson, MD;  Location: [facility] ORS;  Service: Ophthalmology;  Laterality: Left;  [place] 01:37 AP% 19.4 CDE 18.97 fluid  pack lot # [id number]  . CORONARY ANGIOPLASTY     STENT  . CTR    . ESOPHAGOGASTRODUODENOSCOPY (EGD) WITH PROPOFOL N/A 03/04/2016   Procedure: ESOPHAGOGASTRODUODENOSCOPY (EGD) WITH PROPOFOL;  Surgeon: [name], MD;  Location: [facility] ENDOSCOPY;  Service: Endoscopy;  Laterality: N/A;  . EYE SURGERY     cataract  . FRACTURE SURGERY    . HIP PINNING,CANNULATED Left 12/04/2015   Procedure: CANNULATED HIP PINNING;  Surgeon: Thornton Park, MD;  Location: [facility] ORS;  Service: Orthopedics;  Laterality: Left;    Family History  Problem Relation Age of Onset  . Heart disease Father   . Diabetes Mellitus II Other     Social History:  reports that he quit smoking about 2 years ago. His smoking use included cigarettes. He smoked 0.50 packs per day. He has never used smokeless tobacco. He reports that he does not drink alcohol or use drugs.  He smoked 2 packs/week x 60 years.  He is unaware of any exposures to radiation or toxins.  He is a retired Production designer, theatre/television/film.  He lives alone.  He has a wheelchair at his house (Parkinson's, diabetes with neuropathy).  He is able to perform is ADLs.  He has aides come out during the day.  His wife has dementia and lives in the [facility].  Daughter is [name]. The patient is accompanied by his aide, Willette Cluster, today.   Allergies:  Allergies  Allergen Reactions  . Bupropion Other (See Comments)    Seizure  . Cheese     Patient doesn't want any cheese  . Sulfamethoxazole-Trimethoprim Other (See Comments)    Current Medications: Current Outpatient Medications  Medication Sig Dispense Refill  . albuterol (PROVENTIL) (2.5 MG/3ML) 0.083% nebulizer solution Take 2.5 mg by nebulization 4 (four) times daily as needed for wheezing or shortness of breath.    Marland Kitchen amiodarone (PACERONE) 100 MG tablet Take 100 mg by mouth daily.    Marland Kitchen atorvastatin (LIPITOR) 80 MG tablet Take 80 mg by mouth at bedtime.    . brimonidine-timolol (COMBIGAN) 0.2-0.5 % ophthalmic  solution Place 1 drop into both eyes 2 (two) times daily.    . carbidopa-levodopa (SINEMET IR) 25-250 MG tablet Take 2 tablets by mouth every morning (8AM), 1 tablet at noon, 1 tablet every afternoon (430PM) and 1 tablet at bedtime (10PM)    . Carboxymethylcellul-Glycerin (REFRESH OPTIVE OP) Apply 2 drops to eye every 8 (eight) hours as needed (dry eyes).    . Dextromethorphan-Guaifenesin (TUSSIN DM) 10-100 MG/5ML liquid Take 5 mLs by mouth every 6 (six) hours as needed (cough).     Marland Kitchen dextrose (GLUTOSE 15) 40 % GEL Take 1 Tube by mouth. For blood sugar less than 70, repeat in 5 minutes up to 2 doses     . insulin glargine (LANTUS) 100 UNIT/ML injection Inject 14 Units into the skin every morning.     . magnesium oxide (MAG-OX) 400 MG tablet Take 400 mg by mouth  daily.    . metFORMIN (GLUCOPHAGE) 1000 MG tablet Take 1,000 mg by mouth 2 (two) times daily with a meal.     . metoprolol succinate (TOPROL-XL) 25 MG 24 hr tablet Take 25 mg by mouth daily.    . nitroGLYCERIN (NITROSTAT) 0.4 MG SL tablet Place 0.4 mg under the tongue every 5 (five) minutes as needed for chest pain.    . potassium chloride (K-DUR,KLOR-CON) 10 MEQ tablet Take 20 mEq by mouth daily.    Marland Kitchen apixaban (ELIQUIS) 5 MG TABS tablet Take by mouth.    . predniSONE (DELTASONE) 10 MG tablet Take 5 tablets (50 mg total) by mouth daily with breakfast. Take 50 mg daily taper by 10 mg daily then stop (Patient not taking: Reported on 02/09/2018) 15 tablet 0  . pregabalin (LYRICA) 50 MG capsule Take 50 mg by mouth 2 (two) times daily.    Marland Kitchen senna-docusate (SENOKOT-S) 8.6-50 MG tablet Take by mouth.    . traMADol (ULTRAM) 50 MG tablet Take 50 mg by mouth every 6 (six) hours as needed for moderate pain.     No current facility-administered medications for this visit.     Review of Systems  Constitutional: Negative for diaphoresis, fever, malaise/fatigue and weight loss.       "I am just here".   HENT: Negative.   Eyes: Negative for pain and  redness.       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   Respiratory: Positive for cough and shortness of breath (exertional). Negative for hemoptysis and sputum production.        Requires supplemental oxygen  Cardiovascular: Negative for chest pain, palpitations, orthopnea, leg swelling and PND.  Gastrointestinal: Negative for abdominal pain, blood in stool, constipation, diarrhea, melena, nausea and vomiting.  Genitourinary: Negative for dysuria, frequency, hematuria and urgency.  Musculoskeletal: Positive for joint pain (aches). Negative for back pain, falls and myalgias.       Pain in RIGHT breast  Skin: Negative for itching and rash.  Neurological: Positive for sensory change (neuropathy in hands and feet). Negative for dizziness, tremors, weakness and headaches.  Endo/Heme/Allergies: Does not bruise/bleed easily.       PMH (+) for diabetes  Psychiatric/Behavioral: Negative for depression, memory loss and suicidal ideas. The patient is not nervous/anxious and does not have insomnia.   All other systems reviewed and are negative.  Performance status (ECOG): 2 - Symptomatic, <50% confined to bed  Vital Signs: BP 133/84 (BP Location: Left Arm, Patient Position: Sitting)   Pulse 93   Temp 97.9 F (36.6 C) (Tympanic)   Resp 18   Wt 192 lb (87.1 kg)   BMI 29.19 kg/m   Physical Exam  Constitutional: He is oriented to person, place, and time and well-developed, well-nourished, and in no distress.  HENT:  Head: Normocephalic and atraumatic.  Gray hair and scruffy beard.  [place] in place.  Eyes: Pupils are equal, round, and reactive to light. EOM are normal. No scleral icterus.  Blue eyes.   Neck: Normal range of motion. Neck supple. No tracheal deviation present. No thyromegaly present.  Cardiovascular: Normal rate, regular rhythm and normal heart sounds. Exam reveals no gallop and no friction rub.  No murmur heard. Pulmonary/Chest: Effort normal and breath sounds  normal. No respiratory distress. He has no wheezes. He has no rales. He exhibits tenderness (RIGHT upper chest wall).  Abdominal: Soft. Bowel sounds are normal. He exhibits no distension. There is no tenderness.  Musculoskeletal: Normal  range of motion. He exhibits no edema or tenderness.  Lymphadenopathy:    He has no cervical adenopathy.    He has no axillary adenopathy.       Right: No inguinal and no supraclavicular adenopathy present.       Left: No inguinal and no supraclavicular adenopathy present.  Neurological: He is alert and oriented to person, place, and time.  Skin: Skin is warm and dry. No rash noted. No erythema.  Psychiatric: Mood, affect and judgment normal.  Nursing note and vitals reviewed.   No visits with results within 3 Day(s) from this visit.  Latest known visit with results is:  Hospital Outpatient Visit on 02/09/2018  Component Date Value Ref Range Status  . aPTT 02/09/2018 34  24 - 36 seconds Final   Performed at [facility], [address] [place], [place] [address]  . WBC 02/09/2018 10.5  3.8 - 10.6 K/uL Final  . RBC 02/09/2018 4.88  4.40 - 5.90 MIL/uL Final  . Hemoglobin 02/09/2018 12.8* 13.0 - 18.0 g/dL Final  . HCT 02/09/2018 39.3* 40.0 - 52.0 % Final  . MCV 02/09/2018 80.6  80.0 - 100.0 fL Final  . MCH 02/09/2018 26.3  26.0 - 34.0 pg Final  . MCHC 02/09/2018 32.6  32.0 - 36.0 g/dL Final  . RDW 02/09/2018 20.0* 11.5 - 14.5 % Final  . Platelets 02/09/2018 222  150 - 440 K/uL Final   Performed at [facility], [address] [place], [place] [address]  . Prothrombin Time 02/09/2018 14.0  11.4 - 15.2 seconds Final  . INR 02/09/2018 1.09   Final   Performed at [facility], [address] [place], [place] [address]  . Glucose-Capillary 02/09/2018 127* 70 - 99 mg/dL Final  . SURGICAL PATHOLOGY 02/09/2018    Final                   Value:Surgical Pathology CASE: [id number] PATIENT: Gwenevere Ghazi Surgical Pathology  Report     SPECIMEN SUBMITTED: A. Liver, biopsy  CLINICAL HISTORY: 77 year old with ill-defined left hepatic lobe mass with periportal/gastrohepatic ligament adenopathy, PET positive  PRE-OPERATIVE DIAGNOSIS: Elevated AFP, CA19-9, and CEA  POST-OPERATIVE DIAGNOSIS: Same as above     DIAGNOSIS: A. LIVER; ULTRASOUND-GUIDED BIOPSY: - ADENOCARCINOMA, SEE COMMENT.  There is sufficient material for ancillary molecular testing if needed (combined A1 and A2 blocks)  Comment: A limited panel of immunohistochemical stains was performed with the following pattern of immunoreactivity: Cytokeratin 7: Positive Cytokeratin 20: Positive HepPar-1: Negative (high background) CD10: Negative The differential diagnosis for these findings includes primary intrahepatic cholangiocarcinoma and metastatic adenocarcinoma from gastric or pancreaticobiliary primary. These results were communica                         ted to Dr. Mike Gip and Honor Loh, NP via [place] on 02/12/2018.  IHC slides were prepared by [facility], [place], [place]. All controls stained appropriately.  This test was developed and its performance characteristics determined by [facility]. It has not been cleared or approved by the [place] Food and Drug Administration. The FDA does not require this test to go through premarket FDA review. This test is used for clinical purposes. It should not be regarded as investigational or for research. This laboratory is certified under the Clinical Laboratory Improvement Amendments (CLIA) as qualified to perform high complexity clinical laboratory testing.   GROSS DESCRIPTION: A. Labeled: Left  liver biopsy Received: In formalin Tissue fragment(s): Multiple Size: Aggregate, 2.8 x 0.1 x 0.1 cm Description: Pink-tan cores and tissue fragments Entirely submitted in two cassettes.     Final Diagnosis performed by Quay Burow, MD.   Tandy Gaw                          cally signed 02/12/2018 7:48:48AM The electronic signature indicates that the named Attending Pathologist has evaluated the specimen  Technical component performed at [facility], [address] [place], [place] [address] Lab: [phone] Dir: Rush Farmer, MD, MMM  Professional component performed at [facility], [place], [place],  [address] Lab: [phone] Dir: Dellia Nims. Reuel Derby, MD     Assessment:  Gabriel Conry. is a 77 y.o. male with liver lesions of unclear etiology.  He has a > 20 pack year smoking history.  He denies any history of hepatitis.    MRI abdomen without contrast on 01/18/2018 revealed significant enlargement of a lateral segment left liver lobe lesion (4.1 x 3.2 cm). There was new adjacent 3.6 x 3.3 cm gastrohepatic ligament adenopathy.  Differential considerations include hepatocellular carcinoma or metastatic disease from an unknown primary. There were smaller bilateral liver lesions which are also suspicious for hepatocellular carcinomas or metastatic disease.  PET scan on 01/28/2018 revealed hypermetabolism associated with a large ill-defined left hepatic lobe mass (SUV 7.0) as well as periportal/gastrohepatic ligament adenopathy (up to 1.5 cm: SUV 4.3).  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  There was aortic atherosclerosis, three-vessel coronary artery calcification and bilateral renal stones.  Ultrasound-guided liver biopsy on 02/09/2018  revealed adenocarcinoma.  Cytokeratin 7 (+), cytokeratin 20 (+), HepPar-1 (-), CD10 (-).  Differential diagnosis based on IHC staining includes primary intrahepatic cholangiocarcinoma and metastatic adenocarcinoma from gastric or pancreaticobiliary primary. Cancer TYPE ID testing sent on 02/12/2018.   CA19-9 was 253 (0-35).  CEA was 8.3.  AFP was 13.4.  Normal  studies included:  hepatitis B core antibody total, hepatitis C antibody.  He has iron deficiency anemia.  Labs on 01/22/2018 revealed a ferritin 12 and an iron saturation of 9%. B12 and folate were normal.  Labs on 11/26/2017 revealed a normal creatinine and TSH.  Diet appears good.   EGD at [facility] on 04/17/2017 revealed diffuse atrophic mucosa in the entire stomach and multiple localized erosions without bleeding.  Pathology revealed foveolar hyperplasia c/w reactive gastropathy.  Colonoscopy at [facility] on 04/17/2017 revealed two 5-7 mm polyps in the descending colon and transverse colon (adenomatous polyps).  Capsule study on 05/05/2017 revealed polyps x 2 in the proximal small bowel and AVMs x 2 in the terminal ileum and proximal small bowel.  Plan was anterograde and retrograde balloon enteroscopy at [facility].  Symptomatically, patient is "just here". He decribes pain in his RIGHT breast. PCP prescribed lidocaine patches, however pain has not improved. Patient denies any B symptoms. He denies any interval infections. No increased shortness of breath.  Neuropathy is stable.  Code status is DNI.  Plan: 1. Adenocarcinoma of unknown primary  Review biopsy results from 02/09/2018 that demonstrated adenocarcinoma of unknown primary.   Discuss differential diagnosis of primary intrahepatic cholangiocarcinoma and metastatic adenocarcinoma from gastric or pancreaticobiliary primary.  Preliminary discussion regarding chemotherapy treatment of various tumor types.  Cancer TYPE ID (tumor of unknown origin) testing done on 02/12/2018; results pending.  Treatment will be based on primary tumor.  2. Iron deficiency anemia  Discuss oral iron supplementation.  Currently taking iron, with a source of vitamin C, once daily.  Patient is tolerating oral iron at this point.   Continue as previously prescribed.  3. Goals of care  Discussed code status. Patient states, "If they can shock me 1 time and bring me back,  fine. If not, let me go".   Patient requests CPR, but does NOT wish to be intubated.  4. RTC based on results - will call with an appointment.    Honor Loh, NP  02/15/2018, 9:54 AM   I saw and evaluated the patient, participating in the key portions of the service and reviewing pertinent diagnostic studies and records.  I reviewed the nurse practitioner's note and agree with the findings and the plan.  The assessment and plan were discussed with the patient.  Several questions were asked by the patient and answered.   Nolon Stalls, MD 02/15/2018,9:54 AM

## 2018-02-15 ENCOUNTER — Other Ambulatory Visit: Payer: Self-pay | Admitting: Hematology and Oncology

## 2018-02-15 ENCOUNTER — Inpatient Hospital Stay
Payer: No Typology Code available for payment source | Attending: Hematology and Oncology | Admitting: Hematology and Oncology

## 2018-02-15 ENCOUNTER — Telehealth: Payer: Self-pay | Admitting: *Deleted

## 2018-02-15 ENCOUNTER — Encounter: Payer: Self-pay | Admitting: Hematology and Oncology

## 2018-02-15 VITALS — BP 133/84 | HR 93 | Temp 97.9°F | Resp 18 | Wt 192.0 lb

## 2018-02-15 DIAGNOSIS — G20 Parkinson's disease: Secondary | ICD-10-CM | POA: Diagnosis not present

## 2018-02-15 DIAGNOSIS — D509 Iron deficiency anemia, unspecified: Secondary | ICD-10-CM | POA: Diagnosis not present

## 2018-02-15 DIAGNOSIS — I252 Old myocardial infarction: Secondary | ICD-10-CM | POA: Insufficient documentation

## 2018-02-15 DIAGNOSIS — Z7901 Long term (current) use of anticoagulants: Secondary | ICD-10-CM | POA: Diagnosis not present

## 2018-02-15 DIAGNOSIS — I4891 Unspecified atrial fibrillation: Secondary | ICD-10-CM | POA: Insufficient documentation

## 2018-02-15 DIAGNOSIS — E1122 Type 2 diabetes mellitus with diabetic chronic kidney disease: Secondary | ICD-10-CM | POA: Diagnosis not present

## 2018-02-15 DIAGNOSIS — Z79899 Other long term (current) drug therapy: Secondary | ICD-10-CM | POA: Insufficient documentation

## 2018-02-15 DIAGNOSIS — Z87891 Personal history of nicotine dependence: Secondary | ICD-10-CM | POA: Diagnosis not present

## 2018-02-15 DIAGNOSIS — J449 Chronic obstructive pulmonary disease, unspecified: Secondary | ICD-10-CM | POA: Insufficient documentation

## 2018-02-15 DIAGNOSIS — C229 Malignant neoplasm of liver, not specified as primary or secondary: Secondary | ICD-10-CM | POA: Diagnosis not present

## 2018-02-15 NOTE — Telephone Encounter (Signed)
Path called and said that there is not enough tissue to do both tests requested, they can only do one. Please return call [phone] to let them know which one you want done

## 2018-02-15 NOTE — Telephone Encounter (Signed)
Re: QNS for ancillary testing  Call returned to [facility] pathology lab. No one available. Message received earlier regarding tissue sample QNS for all of the ancillary testing that has been ordered on this patient. LMOM advising that sample should be sent for Cancer TYPE ID testing over Foundation One.   Direct dial number left on message should the lab have further questions or concerns.   Honor Loh, MSN, APRN, FNP-C, CEN Oncology/Hematology Nurse Practitioner  [facility] 02/15/18, 7:14 PM

## 2018-02-15 NOTE — Progress Notes (Signed)
Patient here today for liver biopsy results.

## 2018-02-17 ENCOUNTER — Telehealth: Payer: Self-pay | Admitting: *Deleted

## 2018-02-17 NOTE — Telephone Encounter (Signed)
Estafan with [facility] called stating he has an update on the order for this patient and would like a call back [phone]

## 2018-02-17 NOTE — Telephone Encounter (Signed)
  Do you know what the update is?  M

## 2018-02-17 NOTE — Telephone Encounter (Signed)
Call returned to [facility]. No new update. It was the same question about Cancer TYPE ID vs. Foundation One. I had already fielded this call. He noted that the FFPE block was received today and that the testing had began processing.   Gaspar Bidding

## 2018-02-24 ENCOUNTER — Telehealth: Payer: Self-pay | Admitting: *Deleted

## 2018-02-24 ENCOUNTER — Encounter: Payer: Self-pay | Admitting: Hematology and Oncology

## 2018-02-24 NOTE — Telephone Encounter (Signed)
Patient called asking for results of biopsy. Patient has no follow up appointment scheduled.    2wk ago  SURGICAL PATHOLOGY Surgical Pathology  CASE: [phone]  PATIENT: Jimmy Hill  Surgical Pathology Report      SPECIMEN SUBMITTED:  A. Liver, biopsy   CLINICAL HISTORY:  77 year old with ill-defined left hepatic lobe mass with  periportal/gastrohepatic ligament adenopathy, PET positive   PRE-OPERATIVE DIAGNOSIS:  Elevated AFP, CA19-9, and CEA   POST-OPERATIVE DIAGNOSIS:  Same as above      DIAGNOSIS:  A. LIVER; ULTRASOUND-GUIDED BIOPSY:  - ADENOCARCINOMA, SEE COMMENT.   There is sufficient material for ancillary molecular testing if needed  (combined A1 and A2 blocks)   Comment:  A limited panel of immunohistochemical stains was performed with the  following pattern of immunoreactivity:  Cytokeratin 7: Positive  Cytokeratin 20: Positive  HepPar-1: Negative (high background)  CD10: Negative  The differential diagnosis for these findings includes primary  intrahepatic cholangiocarcinoma and metastatic adenocarcinoma from  gastric or pancreaticobiliary primary. These results were communicated  to Dr. Mike Gip and Honor Loh, NP via [place] on 02/12/2018.   IHC slides were prepared by [facility], [facility], [place]. All controls stained appropriately.   This test was developed and its performance characteristics determined  by [facility]. It has not been cleared or approved by the [place] Food and Drug  Administration. The FDA does not require this test to go through  premarket FDA review. This test is used for clinical purposes. It should  not be regarded as investigational or for research. This laboratory is  certified under the Clinical Laboratory Improvement Amendments (CLIA) as  qualified to perform high complexity clinical laboratory testing.    GROSS DESCRIPTION:  A. Labeled: Left liver biopsy  Received: In formalin   Tissue fragment(s): Multiple  Size: Aggregate, 2.8 x 0.1 x 0.1 cm  Description: Pink-tan cores and tissue fragments  Entirely submitted in two cassettes.      Final Diagnosis performed by Quay Burow, MD.  Electronically signed  02/12/2018 7:48:48AM  The electronic signature indicates that the named Attending Pathologist  has evaluated the specimen  Technical component performed at [facility], [address], [place],  [place] [address] Lab: [phone] Dir: Rush Farmer, MD, MMM Professional  component performed at [facility], [facility], [place], [place],  [address] Lab: [phone] Dir: Dellia Nims.  Reuel Derby, MD   Resulting Agency [facility]      Specimen Collected: 02/09/18 11:19  Last Resulted: 02/12/18 08:03

## 2018-02-24 NOTE — Telephone Encounter (Signed)
We have already gone over this in clinic. What he is wanting is the Cancer TYPD ID results. They are not back. He was advised that once the testing results, he will be contacted for a follow up to discuss. No new information at this time.   Honor Loh, MSN, APRN, FNP-C, CEN Oncology/Hematology Nurse Practitioner  [facility] 02/24/18, 2:10 PM

## 2018-03-02 ENCOUNTER — Inpatient Hospital Stay
Payer: No Typology Code available for payment source | Attending: Hematology and Oncology | Admitting: Hematology and Oncology

## 2018-03-02 ENCOUNTER — Encounter: Payer: Self-pay | Admitting: Hematology and Oncology

## 2018-03-02 ENCOUNTER — Other Ambulatory Visit: Payer: Self-pay | Admitting: Hematology and Oncology

## 2018-03-02 ENCOUNTER — Telehealth: Payer: Self-pay | Admitting: *Deleted

## 2018-03-02 VITALS — BP 119/78 | HR 78 | Temp 97.8°F | Resp 18 | Ht 68.0 in | Wt 194.6 lb

## 2018-03-02 DIAGNOSIS — N183 Chronic kidney disease, stage 3 (moderate): Secondary | ICD-10-CM

## 2018-03-02 DIAGNOSIS — Z7189 Other specified counseling: Secondary | ICD-10-CM

## 2018-03-02 DIAGNOSIS — G893 Neoplasm related pain (acute) (chronic): Secondary | ICD-10-CM | POA: Diagnosis not present

## 2018-03-02 DIAGNOSIS — C220 Liver cell carcinoma: Secondary | ICD-10-CM

## 2018-03-02 DIAGNOSIS — J449 Chronic obstructive pulmonary disease, unspecified: Secondary | ICD-10-CM | POA: Diagnosis not present

## 2018-03-02 DIAGNOSIS — D509 Iron deficiency anemia, unspecified: Secondary | ICD-10-CM | POA: Diagnosis not present

## 2018-03-02 DIAGNOSIS — Z87891 Personal history of nicotine dependence: Secondary | ICD-10-CM | POA: Diagnosis not present

## 2018-03-02 DIAGNOSIS — Z01818 Encounter for other preprocedural examination: Secondary | ICD-10-CM

## 2018-03-02 DIAGNOSIS — E1122 Type 2 diabetes mellitus with diabetic chronic kidney disease: Secondary | ICD-10-CM

## 2018-03-02 DIAGNOSIS — C229 Malignant neoplasm of liver, not specified as primary or secondary: Secondary | ICD-10-CM | POA: Diagnosis not present

## 2018-03-02 DIAGNOSIS — E611 Iron deficiency: Secondary | ICD-10-CM | POA: Insufficient documentation

## 2018-03-02 NOTE — Progress Notes (Signed)
[facility] day:  03/02/2018   Chief Complaint: Jimmy Xiang. is a 77 y.o. 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  HPI:  The patient was last seen in the medical oncology clinic on 02/15/2018.  At that time, he felt "just here". He decribed pain in his RIGHT breast.  He denied any B symptoms. He denied any interval infections or increased shortness of breath.  Neuropathy was stable.    Liver biopsy from 02/09/2018 confirmed adenocarcinoma.  Cytokeratin 7 (+), cytokeratin 20 (+), HepPar-1 (-), CD10 (-).  Differential diagnosis based on IHC staining includes primary intrahepatic cholangiocarcinoma and metastatic adenocarcinoma from gastric or pancreaticobiliary primary.  CANCER TYPE ID was sent.  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  Pancreatobiliary 5% (cholangiocacinoma, gallbladder adenocarcinoma, pancreatic adenocarcinoma) cannot be excluded.  During the interm, patient presents this morning with pain in the RIGHT side of his abdomen. Pain radiates into upper chest wall at times. Breathing is stable. He remains on supplemental oxygen ATC.  Patient denies that he has experienced any B symptoms. He denies any interval infections.   He denies any nausea and vomiting. Patient describes a decreased appetite.  Weight today is 194 lb 9.6 oz (88.3 kg), which compared to his last visit to the clinic, represents a 2 pound increase.    Patient complains of pain rated 8/10 in the clinic today.   Past Medical History:  Diagnosis Date  . Allergic rhinitis   . Anginal pain ([place])   . Arthritis   . Autonomic dysreflexia   . Back pain    CHRONIC LBP,STENOSIS  . CHF (congestive heart failure) ([place])   . Chronic kidney disease    STAGE 3  . COPD (chronic obstructive pulmonary disease) ([place])   . Coronary artery  disease   . Depression   . Diabetes ([place])   . Diabetes mellitus without complication ([place])   . DJD (degenerative joint disease)   . Dysphagia   . Dysrhythmia    A-fib, now converted  . Edema    FEET/ANKLES  . GERD (gastroesophageal reflux disease)   . Glaucoma   . H/O orthostatic hypotension   . Hypercholesterolemia   . Myocardial infarction ([place]) 2000  . Parkinson's disease ([place])   . Peripheral vascular disease ([place])   . Pneumonia    history of   . Polyneuropathy   . RLS (restless legs syndrome)   . Seizures ([place])    when in rehab for his hip  . Sleep apnea   . Smoker   . Spinal stenosis   . Strabismic amblyopia   . Tinnitus     Past Surgical History:  Procedure Laterality Date  . ANKLE SURGERY    . APPENDECTOMY    . BACK SURGERY    . CATARACT EXTRACTION W/PHACO Left 08/26/2016   Procedure: CATARACT EXTRACTION PHACO AND INTRAOCULAR LENS PLACEMENT (IOC);  Surgeon: Birder Robson, MD;  Location: [facility] ORS;  Service: Ophthalmology;  Laterality: Left;  [place] 01:37 AP% 19.4 CDE 18.97 fluid pack lot # [id number]  . CORONARY ANGIOPLASTY     STENT  . CTR    . ESOPHAGOGASTRODUODENOSCOPY (EGD) WITH PROPOFOL N/A 03/04/2016   Procedure: ESOPHAGOGASTRODUODENOSCOPY (EGD) WITH PROPOFOL;  Surgeon: [name], MD;  Location: [facility] ENDOSCOPY;  Service: Endoscopy;  Laterality: N/A;  . EYE SURGERY     cataract  .  FRACTURE SURGERY    . HIP PINNING,CANNULATED Left 12/04/2015   Procedure: CANNULATED HIP PINNING;  Surgeon: Thornton Park, MD;  Location: [facility] ORS;  Service: Orthopedics;  Laterality: Left;    Family History  Problem Relation Age of Onset  . Heart disease Father   . Diabetes Mellitus II Other     Social History:  reports that he quit smoking about 2 years ago. His smoking use included cigarettes. He smoked 0.50 packs per day. He has never used smokeless tobacco. He reports that he does not drink alcohol or use drugs.  He smoked 2 packs/week x 60 years.  He is unaware of  any exposures to radiation or toxins.  He is a retired Production designer, theatre/television/film.  He lives alone.  He has a wheelchair at his house (Parkinson's, diabetes with neuropathy).  He is able to perform is ADLs.  He has aides come out during the day.  His wife has dementia and lives in the [facility]  Daughter is [name]. The patient is accompanied by his aide, Darla, today.   Allergies:  Allergies  Allergen Reactions  . Bupropion Other (See Comments)    Seizure  . Cheese     Patient doesn't want any cheese  . Sulfamethoxazole-Trimethoprim Other (See Comments)    Current Medications: Current Outpatient Medications  Medication Sig Dispense Refill  . amiodarone (PACERONE) 100 MG tablet Take 100 mg by mouth daily.    Marland Kitchen apixaban (ELIQUIS) 5 MG TABS tablet Take by mouth.    Marland Kitchen atorvastatin (LIPITOR) 80 MG tablet Take 80 mg by mouth at bedtime.    . brimonidine-timolol (COMBIGAN) 0.2-0.5 % ophthalmic solution Place 1 drop into both eyes 2 (two) times daily.    . Calcium Carbonate-Vitamin D (CALCIUM 600+D PO) Take 1 tablet by mouth daily.    . carbidopa-levodopa (SINEMET IR) 25-250 MG tablet Take 2 tablets by mouth every morning (8AM), 1 tablet at noon, 1 tablet every afternoon (430PM) and 1 tablet at bedtime (10PM)    . Carboxymethylcellul-Glycerin (REFRESH OPTIVE OP) Apply 2 drops to eye every 8 (eight) hours as needed (dry eyes).    Marland Kitchen dextrose (GLUTOSE 15) 40 % GEL Take 1 Tube by mouth. For blood sugar less than 70, repeat in 5 minutes up to 2 doses     . ferrous sulfate 325 (65 FE) MG tablet Take 325 mg by mouth daily with breakfast.    . insulin glargine (LANTUS) 100 UNIT/ML injection Inject 14 Units into the skin every morning.     . isosorbide mononitrate (ISMO,MONOKET) 10 MG tablet Take 10 mg by mouth 2 (two) times daily.    Marland Kitchen levETIRAcetam (KEPPRA) 500 MG tablet Take 500 mg by mouth 2 (two) times daily.    Marland Kitchen lidocaine (LIDODERM) 5 % Place 1 patch onto the skin daily. Remove & Discard patch  within 12 hours or as directed by MD    . magnesium oxide (MAG-OX) 400 MG tablet Take 400 mg by mouth daily.    . metFORMIN (GLUCOPHAGE) 1000 MG tablet Take 1,000 mg by mouth 2 (two) times daily with a meal.     . metoprolol succinate (TOPROL-XL) 25 MG 24 hr tablet Take 25 mg by mouth daily.    . mirabegron ER (MYRBETRIQ) 50 MG TB24 tablet Take 50 mg by mouth daily.    . Oxycodone HCl 10 MG TABS Take 10 mg by mouth 2 (two) times daily as needed.    . pantoprazole (PROTONIX) 40 MG tablet  Take 40 mg by mouth daily.    . potassium chloride (K-DUR,KLOR-CON) 10 MEQ tablet Take 20 mEq by mouth daily.    . pregabalin (LYRICA) 50 MG capsule Take 50 mg by mouth 2 (two) times daily.    Marland Kitchen senna-docusate (SENOKOT-S) 8.6-50 MG tablet Take by mouth.    . traMADol (ULTRAM) 50 MG tablet Take 50 mg by mouth every 6 (six) hours as needed for moderate pain.    Marland Kitchen albuterol (PROVENTIL) (2.5 MG/3ML) 0.083% nebulizer solution Take 2.5 mg by nebulization 4 (four) times daily as needed for wheezing or shortness of breath.    . Dextromethorphan-Guaifenesin (TUSSIN DM) 10-100 MG/5ML liquid Take 5 mLs by mouth every 6 (six) hours as needed (cough).     . naloxone (NARCAN) nasal spray 4 mg/0.1 mL Place 1 spray into the nose.    . nitroGLYCERIN (NITROSTAT) 0.4 MG SL tablet Place 0.4 mg under the tongue every 5 (five) minutes as needed for chest pain.    . predniSONE (DELTASONE) 10 MG tablet Take 5 tablets (50 mg total) by mouth daily with breakfast. Take 50 mg daily taper by 10 mg daily then stop (Patient not taking: Reported on 02/09/2018) 15 tablet 0   No current facility-administered medications for this visit.     Review of Systems  Constitutional: Negative for diaphoresis, fever, malaise/fatigue and weight loss (up 2 pounds; appetite decreased).       "I'm here".  HENT: Negative.   Eyes: Negative.        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 Negative for hemoptysis and sputum production.        Requires supplemental oxygen ATC  Cardiovascular: Negative for chest pain, palpitations, orthopnea, leg swelling and PND.  Gastrointestinal: Positive for abdominal pain (RIGHT sided). Negative for blood in stool, constipation, diarrhea, melena, nausea and vomiting.  Genitourinary: Negative for dysuria, frequency, hematuria and urgency.  Musculoskeletal: Positive for joint pain. Negative for back pain, falls and myalgias.  Skin: Negative for itching and rash.  Neurological: Positive for sensory change (neuropathy in hands and feet). Negative for dizziness, tremors, weakness and headaches.  Endo/Heme/Allergies: Does not bruise/bleed easily.       PMH (+) for diabetes  Psychiatric/Behavioral: Negative for depression, memory loss and suicidal ideas. The patient is not nervous/anxious and does not have insomnia.   All other systems reviewed and are negative.  Performance status (ECOG): 2 - Symptomatic, <50% confined to bed  Vital Signs: BP 119/78 (BP Location: Right Arm, Patient Position: Sitting)   Pulse 78   Temp 97.8 F (36.6 C) (Tympanic)   Resp 18   Ht 5\' 8"  (1.727 m)   Wt 194 lb 9.6 oz (88.3 kg)   SpO2 97%   BMI 29.59 kg/m   Physical Exam  Constitutional: He is oriented to person, place, and time.  Chronically fatigued appearing elderly gentleman sitting comfortably in the exam room in no acute distress.  HENT:  Head: Normocephalic and atraumatic.  Gray hair and scruffy beard.  [place] in place.   Eyes: Conjunctivae and EOM are normal. No scleral icterus.  Blue eyes.   Neck: Normal range of motion.  Musculoskeletal: He exhibits no edema.  Neurological: He is alert and oriented to person, place, and time.  Psychiatric: Mood, affect and judgment normal.  Nursing note and vitals reviewed.   No visits with results within 3 Day(s) from this visit.  Latest known visit with results is:  Hospital  Outpatient Visit on 02/09/2018  Component Date Value Ref Range Status  . aPTT 02/09/2018 34  24 - 36 seconds Final   Performed at [facility], [address] [place], [place] [address]  . WBC 02/09/2018 10.5  3.8 - 10.6 K/uL Final  . RBC 02/09/2018 4.88  4.40 - 5.90 MIL/uL Final  . Hemoglobin 02/09/2018 12.8* 13.0 - 18.0 g/dL Final  . HCT 02/09/2018 39.3* 40.0 - 52.0 % Final  . MCV 02/09/2018 80.6  80.0 - 100.0 fL Final  . MCH 02/09/2018 26.3  26.0 - 34.0 pg Final  . MCHC 02/09/2018 32.6  32.0 - 36.0 g/dL Final  . RDW 02/09/2018 20.0* 11.5 - 14.5 % Final  . Platelets 02/09/2018 222  150 - 440 K/uL Final   Performed at [facility], [address], [place], [place] [address]  . Prothrombin Time 02/09/2018 14.0  11.4 - 15.2 seconds Final  . INR 02/09/2018 1.09   Final   Performed at [facility], [address] [place], [place] [address]  . Glucose-Capillary 02/09/2018 127* 70 - 99 mg/dL Final  . SURGICAL PATHOLOGY 02/09/2018    Final                   Value:Surgical Pathology CASE: [id number] PATIENT: Jimmy Hill Surgical Pathology Report     SPECIMEN SUBMITTED: A. Liver, biopsy  CLINICAL HISTORY: 77 year old with ill-defined left hepatic lobe mass with periportal/gastrohepatic ligament adenopathy, PET positive  PRE-OPERATIVE DIAGNOSIS: Elevated AFP, CA19-9, and CEA  POST-OPERATIVE DIAGNOSIS: Same as above     DIAGNOSIS: A. LIVER; ULTRASOUND-GUIDED BIOPSY: - ADENOCARCINOMA, SEE COMMENT.  There is sufficient material for ancillary molecular testing if needed (combined A1 and A2 blocks)  Comment: A limited panel of immunohistochemical stains was performed with the following pattern of immunoreactivity: Cytokeratin 7: Positive Cytokeratin 20: Positive HepPar-1: Negative (high background) CD10: Negative The differential diagnosis for these findings includes primary intrahepatic cholangiocarcinoma and metastatic adenocarcinoma  from gastric or pancreaticobiliary primary. These results were communica                         ted to Dr. Mike Gip and Honor Loh, NP via [place] on 02/12/2018.  IHC slides were prepared by [facility] for Molecular Biology and Pathology, [place], [place]. All controls stained appropriately.  This test was developed and its performance characteristics determined by [facility]. It has not been cleared or approved by the [place] Food and Drug Administration. The FDA does not require this test to go through premarket FDA review. This test is used for clinical purposes. It should not be regarded as investigational or for research. This laboratory is certified under the Clinical Laboratory Improvement Amendments (CLIA) as qualified to perform high complexity clinical laboratory testing.   GROSS DESCRIPTION: A. Labeled: Left liver biopsy Received: In formalin Tissue fragment(s): Multiple Size: Aggregate, 2.8 x 0.1 x 0.1 cm Description: Pink-tan cores and tissue fragments Entirely submitted in two cassettes.     Final Diagnosis performed by Quay Burow, MD.   Tandy Gaw                         cally signed 02/12/2018 7:48:48AM The electronic signature indicates that the named Attending Pathologist has evaluated the specimen  Technical component performed at [facility], [address], [place], [place] [address] Lab: [phone] Dir: Rush Farmer, MD, MMM  Professional component  performed at [facility], [address], [place], [place], [place] [address] Lab: [phone] Dir: Dellia Nims. Reuel Derby, MD     Assessment:  Jimmy Shippy. is a 77 y.o. male with adenocarcinoma s/p ultrasound guided biopsy on 02/09/2018.  Pathology revealed adenocarcinoma.  Cytokeratin 7 (+), cytokeratin 20 (+), HepPar-1 (-), CD10 (-).  Differential diagnosis based on IHC staining includes primary intrahepatic cholangiocarcinoma and metastatic adenocarcinoma from gastric or pancreaticobiliary  primary. Cancer TYPE ID testing sent on 02/12/2018.   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  Pancreatobiliary 5% (cholangiocacinoma, gallbladder adenocarcinoma, pancreatic adenocarcinoma) cannot be excluded.  CA19-9 was 253 (0-35).  CEA was 8.3.  AFP was 13.4.  Normal studies included:  hepatitis B core antibody total, hepatitis C antibody.  MRI abdomen without contrast on 01/18/2018 revealed significant enlargement of a lateral segment left liver lobe lesion (4.1 x 3.2 cm). There was new adjacent 3.6 x 3.3 cm gastrohepatic ligament adenopathy.  Differential considerations include hepatocellular carcinoma or metastatic disease from an unknown primary. There were smaller bilateral liver lesions which are also suspicious for hepatocellular carcinomas or metastatic disease.  PET scan on 01/28/2018 revealed hypermetabolism associated with a large ill-defined left hepatic lobe mass (SUV 7.0) as well as periportal/gastrohepatic ligament adenopathy (up to 1.5 cm: SUV 4.3).  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  There was aortic atherosclerosis, three-vessel coronary artery calcification and bilateral renal stones.  He has iron deficiency anemia.  Labs on 01/22/2018 revealed a ferritin 12 and an iron saturation of 9%. B12 and folate were normal.  Labs on 11/26/2017 revealed a normal creatinine and TSH.  Diet appears good.   EGD at [facility] on 04/17/2017 revealed diffuse atrophic mucosa in the entire stomach and multiple localized erosions without bleeding.  Pathology revealed foveolar hyperplasia c/w reactive gastropathy.  Colonoscopy at [facility] on 04/17/2017 revealed two 5-7 mm polyps in the descending colon and transverse colon (adenomatous polyps).  Capsule study on 05/05/2017 revealed polyps x 2 in the proximal small bowel and AVMs x 2 in the terminal ileum and proximal  small bowel.  Plan was anterograde and retrograde balloon enteroscopy at [facility].  Code status is DNI.  Symptomatically, patient is fatigued. He has pain in his RIGHT abdomen that radiates upwards into chest wall. Not using pain medications as prescribed. No B symptoms or recent infections. Remains on supplemental oxygen ATC. Neuropathy in hands and feet is stable. Exam grossly unchanged from previous.   Plan: 1. Adenocarcinoma of unknown primary  Review results of CancerTYPE ID with 90% probability hepatocellular carcinoma.  Patient is non operable (disease outside of liver and not a transplant candidate).   Discuss case with [facility]. Leslie [facility] at [facility].   Pugh class A  Discuss consideration of sorafenib or lenvatinib.  Needs baseline ECG and echocardiogram. Will order and scheduled today.    Side effects of both medications reviewed. Patient provided with printed information today on his AVS for review at home. Patient encouraged to make a list of questions and/or concerns for further discussion prior to beginning therapy using this medication.   If not tolerating oral chemotherapy, will consider immunotherapy.  2. Iron deficiency anemia  Discuss oral iron supplementation.  Currently taking iron, with a source of vitamin C, once daily.  Patient tolerating oral iron without complaints of constipation.   Continue as previously prescribed.  3. Pain and  symptom management  Lidoderm patches not overly effective.   Has Roxicodone to use on a PRN basis, however patient is not using.   Encouraged to use prescribed Roxicodone, and to maintain a pain diary for provider review.   With review of pain diary, we can consider LAO therapy.  4. Goals of care  Discussed code status. Patient states, "If they can shock me 1 time and bring me back, fine. If not, let me go".   Patient requests CPR, but does NOT wish to be intubated.  5. RTC after testing and medication approval for MD  assessment, labs (CBC with diff, CMP), and initiation of oral chemotherapy.    Honor Loh, NP  03/02/2018, 12:21 PM   I saw and evaluated the patient, participating in the key portions of the service and reviewing pertinent diagnostic studies and records.  I reviewed the nurse practitioner's note and agree with the findings and the plan.  The assessment and plan were discussed with the patient.  Several questions were asked by the patient and answered.   Nolon Stalls, MD 03/02/2018,12:21 PM

## 2018-03-02 NOTE — Telephone Encounter (Signed)
Called Patient and made him aware of his EKG and ECHO appts Location, date and time. Patient is scheduled for 03/05/18 10:00 for pt. ECHO And 11:15 for the EKG. [facility] was also notified about patient appts. So he would have transportation to his appts.

## 2018-03-02 NOTE — Patient Instructions (Signed)
Lenvatinib oral capsule What is this medicine? LENVATINIB (len VA ti nib) is a medicine that targets proteins in cancer cells and stops the cancer cells from growing. It is used to treat thyroid cancer and kidney cancer. This medicine may be used for other purposes; ask your health care provider or pharmacist if you have questions. COMMON BRAND NAME(S): [place] What should I tell my health care provider before I take this medicine? They need to know if you have any of these conditions: -diabetes -high blood pressure -heart disease -history of blood clots -history of a drug or alcohol abuse problem -history of irregular heartbeat -history of low levels of calcium, magnesium, or potassium in the blood -inflammatory bowel disease -kidney disease -liver disease -protein in your urine -an unusual or allergic reaction to lenvatinib, other medicines, foods, dyes, or preservatives -pregnant or trying to get pregnant -breast-feeding How should I use this medicine? Take this medicine by mouth with a glass of water. Follow the directions on the prescription label. Swallow the capsules whole. Do not cut, crush or chew this medicine. Take your medicine at regular intervals. Do not take it more often than directed. Do not stop taking except on your doctor's advice. Talk to your pediatrician regarding the use of this medicine in children. Special care may be needed. Overdosage: If you think you have taken too much of this medicine contact a poison control center or emergency room at once. NOTE: This medicine is only for you. Do not share this medicine with others. What if I miss a dose? If you miss a dose, take it as soon as you can. If your next dose is to be taken in less than 12 hours, then do not take the missed dose. Take the next dose at your regular time. Do not take double or extra doses. What may interact with this medicine? Do not take this medicine with any of the following  medications: -cisapride -dofetilide -dronedarone -pimozide -thioridazine -ziprasidone This medicine may also interact with the following medications: -alfuzosin -bedaquiline -certain antibiotics like azithromycin, clarithromycin or erythromycin -certain medicines for bladder problems like solifenacin, tolterodine -certain medicines for depression, anxiety, or psychotic disturbances -certain medicines for fungal infections like ketoconazole, posaconazole, voriconazole, fluconazole, and itraconazole -certain medicines for irregular heart beat like amiodarone, dofetilide, flecainide, propafenone, quinidine -chloroquine -ciprofloxacin -cyclobenzaprine -ezogabine -fingolimod -granisetron -leuprolide -ritonavir -lopinavir; ritonavir -methadone -metronidazole -mifepristone -octreotide -ondansetron -other medicines that prolong the QT interval (cause an abnormal heart rhythm) -pasireotide -pentamidine -promethazine -quinine -ranolazine -rifampin -rilpivirine -romidepsin -saquinavir -tacrolimus -telavancin -telithromycin -tetrabenazine -tizanidine -toremifene -vardenafil -vorinostat This list may not describe all possible interactions. Give your health care provider a list of all the medicines, herbs, non-prescription drugs, or dietary supplements you use. Also tell them if you smoke, drink alcohol, or use illegal drugs. Some items may interact with your medicine. What should I watch for while using this medicine? Visit your doctor for regular check ups. Report any side effects. Continue your course of treatment unless your doctor tells you to stop. You will need blood work done while you are taking this medicine. Do not become pregnant while taking this medicine or for 2 weeks after stopping it. Women should inform their doctor if they wish to become pregnant or think they might be pregnant. There is a potential for serious side effects to an unborn child. Talk to your  health care professional or pharmacist for more information. Do not breast-feed an infant while taking this medicine. Be careful brushing and flossing  your teeth or using a toothpick because you may get an infection or bleed more easily. If you have any dental work done, tell your dentist you are receiving this medicine. This medicine may increase your risk to bruise or bleed. Call your doctor or health care professional if you notice any unusual bleeding. This drug may make you feel generally unwell. This is not uncommon, as chemotherapy can affect healthy cells as well as cancer cells. Report any side effects. Continue your course of treatment even though you feel ill unless your doctor tells you to stop. This medicine may interfere with the ability to have a child. You should talk with your doctor or health care professional if you are concerned about your fertility. What side effects may I notice from receiving this medicine? Side effects that you should report to your doctor or health care professional as soon as possible: -allergic reactions like skin rash, itching or hives, swelling of the face, lips, or tongue -breathing problems -chest pain or palpitations -dizziness -feeling faint or lightheaded, falls -headache -high blood pressure -seizures -signs and symptoms of bleeding such as bloody or black, tarry stools; red or dark-brown urine; spitting up blood or brown material that looks like coffee grounds; red spots on the skin; unusual bruising or bleeding from the eye, gums, or nose -signs and symptoms of a dangerous change in heartbeat or heart rhythm like chest pain; dizziness; fast or irregular heartbeat; palpitations; feeling faint or lightheaded, falls; breathing problems -signs and symptoms of kidney injury like trouble passing urine or change in the amount of urine -signs and symptoms of liver injury like dark yellow or brown urine; general ill feeling or flu-like symptoms;  light-colored stools; loss of appetite; nausea; right upper belly pain; unusually weak or tired; yellowing of the eyes or skin -signs and symptoms of low potassium like muscle cramps or muscle pain; chest pain; dizziness; feeling faint or lightheaded, falls; palpitations; breathing problems; or fast, irregular heartbeat -signs and symptoms of a stroke like changes in vision; confusion; trouble speaking or understanding; severe headaches; sudden numbness or weakness of the face, arm or leg; trouble walking; dizziness; loss of balance or coordination -stomach pain -swelling of the legs or ankles -unusually weak or tired Side effects that usually do not require medical attention (report to your doctor or health care professional if they continue or are bothersome): -diarrhea -joint pain -loss of appetite -mouth sores -muscle pain -nausea, vomiting -weight loss This list may not describe all possible side effects. Call your doctor for medical advice about side effects. You may report side effects to FDA at 1-800-FDA-1088. Where should I keep my medicine? Keep out of the reach of children. Store between 20 and 25 degrees C (68 and 77 degrees F). Throw away any unused medicine after the expiration date. NOTE: This sheet is a summary. It may not cover all possible information. If you have questions about this medicine, talk to your doctor, pharmacist, or health care provider.  2018 Elsevier/Gold Standard (2015-06-07 11:15:33) Sorafenib Oral Tablet What is this medicine? SORAFENIB (soe RAF e nib) is a medicine that targets proteins in cancer cells and stops the cancer cells from growing. It is used to treat liver cancer, kidney cancer, and thyroid cancer. This medicine may be used for other purposes; ask your health care provider or pharmacist if you have questions. COMMON BRAND NAME(S): Nexavar What should I tell my health care provider before I take this medicine? They need to know if you  have  any of these conditions: -bleeding problems -heart disease -high blood pressure -kidney disease -liver disease -lung cancer -recent surgery -an unusual or allergic reaction to sorafenib, other medicines, foods, dyes, or preservatives -pregnant or trying to get pregnant -breast-feeding How should I use this medicine? Take this medicine by mouth with a glass of water. Follow the directions on the prescription label. Do not cut, crush or chew this medicine. Take this medicine on an empty stomach, at least 1 hour before or 2 hours after meals. Do not take with food. Take your medicine at regular intervals. Do not take it more often than directed. Do not stop taking except on your doctor's advice. Talk to your pediatrician regarding the use of this medicine in children. Special care may be needed. Overdosage: If you think you have taken too much of this medicine contact a poison control center or emergency room at once. NOTE: This medicine is only for you. Do not share this medicine with others. What if I miss a dose? If you miss a dose, take it as soon as you can. If it is almost time for your next dose, take only that dose. Do not take double or extra doses. What may interact with this medicine? This medicine may interact with the following medications: -carbamazepine -dexamethasone -medicines for seizures like carbamazepine, phenobarbital, phenytoin -neomycin -rifabutin -rifampin -St. John's Wort -warfarin This list may not describe all possible interactions. Give your health care provider a list of all the medicines, herbs, non-prescription drugs, or dietary supplements you use. Also tell them if you smoke, drink alcohol, or use illegal drugs. Some items may interact with your medicine. What should I watch for while using this medicine? This drug may make you feel generally unwell. This is not uncommon, as chemotherapy can affect healthy cells as well as cancer cells. Report any side  effects. Continue your course of treatment even though you feel ill unless your doctor tells you to stop. Men and women should use effective birth control while taking this medicine and for 2 weeks after stopping this medicine. Do not become pregnant while taking this medicine. Women should inform their doctor if they wish to become pregnant or think they might be pregnant. There is a potential for serious side effects to an unborn child. Talk to your health care professional or pharmacist for more information. Do not breast-feed an infant while taking this medicine. If you are going to have surgery or any other procedures, tell your doctor you are taking this medicine. What side effects may I notice from receiving this medicine? Side effects that you should report to your doctor or health care professional as soon as possible: -allergic reactions like skin rash, itching or hives, swelling of the face, lips, or tongue -black, tarry stools -breathing problems -chest pain or chest tightness -dark urine -dizziness -fast or irregular heartbeat -feeling faint or lightheaded -high fever -light-colored stools -nausea, vomiting -redness, blistering, peeling or loosening of the skin, including inside the mouth -right upper belly pain -sores on the hands or feet -spitting up blood or brown material that looks like coffee grounds -stomach pain -yellowing of the eyes or skin Side effects that usually do not require medical attention (report to your doctor or health care professional if they continue or are bothersome): -diarrhea -hair loss -loss of appetite -tiredness -weight loss This list may not describe all possible side effects. Call your doctor for medical advice about side effects. You may report side effects to  FDA at 1-800-FDA-1088. Where should I keep my medicine? Keep out of the reach of children. Store at room temperature between 15 and 30 degrees C (59 and 86 degrees F). Protect from  moisture. Throw away any unused medicine after the expiration date. NOTE: This sheet is a summary. It may not cover all possible information. If you have questions about this medicine, talk to your doctor, pharmacist, or health care provider.  2018 Elsevier/Gold Standard (2015-06-10 21:34:08)

## 2018-03-05 ENCOUNTER — Ambulatory Visit: Payer: No Typology Code available for payment source

## 2018-03-05 ENCOUNTER — Ambulatory Visit
Admission: RE | Admit: 2018-03-05 | Discharge: 2018-03-05 | Disposition: A | Discharge status: Home / Self Care | Payer: No Typology Code available for payment source | Source: Ambulatory Visit | Attending: Urgent Care | Admitting: Urgent Care

## 2018-03-05 DIAGNOSIS — Z01818 Encounter for other preprocedural examination: Secondary | ICD-10-CM | POA: Diagnosis not present

## 2018-03-05 DIAGNOSIS — J449 Chronic obstructive pulmonary disease, unspecified: Secondary | ICD-10-CM | POA: Insufficient documentation

## 2018-03-05 DIAGNOSIS — I2510 Atherosclerotic heart disease of native coronary artery without angina pectoris: Secondary | ICD-10-CM | POA: Diagnosis not present

## 2018-03-05 DIAGNOSIS — E1122 Type 2 diabetes mellitus with diabetic chronic kidney disease: Secondary | ICD-10-CM | POA: Diagnosis not present

## 2018-03-05 DIAGNOSIS — N189 Chronic kidney disease, unspecified: Secondary | ICD-10-CM | POA: Diagnosis not present

## 2018-03-05 DIAGNOSIS — C229 Malignant neoplasm of liver, not specified as primary or secondary: Secondary | ICD-10-CM | POA: Insufficient documentation

## 2018-03-05 DIAGNOSIS — I252 Old myocardial infarction: Secondary | ICD-10-CM | POA: Insufficient documentation

## 2018-03-05 DIAGNOSIS — E1151 Type 2 diabetes mellitus with diabetic peripheral angiopathy without gangrene: Secondary | ICD-10-CM | POA: Insufficient documentation

## 2018-03-05 DIAGNOSIS — I509 Heart failure, unspecified: Secondary | ICD-10-CM | POA: Diagnosis not present

## 2018-03-05 NOTE — Progress Notes (Signed)
*  PRELIMINARY RESULTS* Echocardiogram 2D Echocardiogram has been performed.  [name] 03/05/2018, 10:35 AM

## 2018-03-09 ENCOUNTER — Telehealth: Payer: Self-pay | Admitting: *Deleted

## 2018-03-09 NOTE — Telephone Encounter (Signed)
Called patient's daughter Donette Larry) and made her aware of the scheduled MD appt on 03/18/18 @ 1015

## 2018-03-10 ENCOUNTER — Other Ambulatory Visit: Payer: Self-pay | Admitting: Internal Medicine

## 2018-03-10 DIAGNOSIS — I480 Paroxysmal atrial fibrillation: Secondary | ICD-10-CM

## 2018-03-18 ENCOUNTER — Encounter: Payer: Self-pay | Admitting: Hematology and Oncology

## 2018-03-18 ENCOUNTER — Telehealth: Payer: Self-pay | Admitting: Pharmacist

## 2018-03-18 ENCOUNTER — Other Ambulatory Visit: Payer: Self-pay

## 2018-03-18 ENCOUNTER — Ambulatory Visit: Payer: No Typology Code available for payment source | Admitting: Hematology and Oncology

## 2018-03-18 ENCOUNTER — Inpatient Hospital Stay (HOSPITAL_BASED_OUTPATIENT_CLINIC_OR_DEPARTMENT_OTHER): Payer: No Typology Code available for payment source | Admitting: Hematology and Oncology

## 2018-03-18 ENCOUNTER — Other Ambulatory Visit: Payer: Self-pay | Admitting: Hematology and Oncology

## 2018-03-18 ENCOUNTER — Inpatient Hospital Stay: Payer: No Typology Code available for payment source

## 2018-03-18 VITALS — BP 91/57 | HR 90 | Temp 96.9°F | Resp 18 | Wt 194.2 lb

## 2018-03-18 DIAGNOSIS — C220 Liver cell carcinoma: Secondary | ICD-10-CM

## 2018-03-18 DIAGNOSIS — Z79899 Other long term (current) drug therapy: Secondary | ICD-10-CM

## 2018-03-18 DIAGNOSIS — Z7189 Other specified counseling: Secondary | ICD-10-CM | POA: Insufficient documentation

## 2018-03-18 DIAGNOSIS — G893 Neoplasm related pain (acute) (chronic): Secondary | ICD-10-CM

## 2018-03-18 DIAGNOSIS — D509 Iron deficiency anemia, unspecified: Secondary | ICD-10-CM

## 2018-03-18 DIAGNOSIS — C229 Malignant neoplasm of liver, not specified as primary or secondary: Secondary | ICD-10-CM | POA: Diagnosis not present

## 2018-03-18 LAB — TSH: TSH: 1.691 u[IU]/mL (ref 0.350–4.500)

## 2018-03-18 LAB — CBC WITH DIFFERENTIAL/PLATELET
Abs Immature Granulocytes: 0.02 10*3/uL (ref 0.00–0.07)
Basophils Absolute: 0.1 10*3/uL (ref 0.0–0.1)
Basophils Relative: 1 %
Eosinophils Absolute: 0.3 10*3/uL (ref 0.0–0.5)
Eosinophils Relative: 4 %
HCT: 43.3 % (ref 39.0–52.0)
Hemoglobin: 13.5 g/dL (ref 13.0–17.0)
Immature Granulocytes: 0 %
Lymphocytes Relative: 24 %
Lymphs Abs: 2.1 10*3/uL (ref 0.7–4.0)
MCH: 27.2 pg (ref 26.0–34.0)
MCHC: 31.2 g/dL (ref 30.0–36.0)
MCV: 87.1 fL (ref 80.0–100.0)
Monocytes Absolute: 0.9 10*3/uL (ref 0.1–1.0)
Monocytes Relative: 10 %
Neutro Abs: 5.5 10*3/uL (ref 1.7–7.7)
Neutrophils Relative %: 61 %
Platelets: 209 10*3/uL (ref 150–400)
RBC: 4.97 MIL/uL (ref 4.22–5.81)
RDW: 17.5 % — ABNORMAL HIGH (ref 11.5–15.5)
WBC: 9 10*3/uL (ref 4.0–10.5)
nRBC: 0 % (ref 0.0–0.2)

## 2018-03-18 LAB — COMPREHENSIVE METABOLIC PANEL
ALT: 7 U/L (ref 0–44)
AST: 29 U/L (ref 15–41)
Albumin: 3.4 g/dL — ABNORMAL LOW (ref 3.5–5.0)
Alkaline Phosphatase: 62 U/L (ref 38–126)
Anion gap: 10 (ref 5–15)
BUN: 13 mg/dL (ref 8–23)
CO2: 29 mmol/L (ref 22–32)
Calcium: 8.9 mg/dL (ref 8.9–10.3)
Chloride: 101 mmol/L (ref 98–111)
Creatinine, Ser: 1.2 mg/dL (ref 0.61–1.24)
GFR calc Af Amer: >60 mL/min (ref >60)
GFR calc non Af Amer: 57 mL/min — ABNORMAL LOW (ref >60)
Glucose, Bld: 184 mg/dL — ABNORMAL HIGH (ref 70–99)
Potassium: 3.6 mmol/L (ref 3.5–5.1)
Sodium: 140 mmol/L (ref 135–145)
Total Bilirubin: 0.6 mg/dL (ref 0.3–1.2)
Total Protein: 6.7 g/dL (ref 6.5–8.1)

## 2018-03-18 NOTE — Telephone Encounter (Signed)
Oral Oncology Pharmacist Encounter  Received new prescription for Lenvima (lenvatinib) for the treatment of [place], planned duration until disease progression or unacceptable drug toxicity.  CMP from 01/22/18 assessed, no relevant lab abnormalities. BP from 03/02/18 well controlled. UA baseline needed for proteinuria monitoring. Check UA periodically during treatment. Prescription dose and frequency assessed.   Current medication list in Epic reviewed, one DDIs with lenvatinib identified: -Amiodarone: Lenvatinib, a QT-prolonging Kinase Inhibitors, may enhance the QTc-prolonging effect of amiodarone. Recommend obtaining a baseline QTc and monitor after the start of lenvatinib.  Prescription has been e-scribed to the [facility] for benefits analysis and approval.  [facility] will continue to follow for insurance authorization, copayment issues, initial counseling and start date.  Darl Pikes, PharmD, BCPS, [facility] Hematology/Oncology Clinical Pharmacist [facility] [phone]  03/18/2018 9:38 AM

## 2018-03-18 NOTE — Progress Notes (Signed)
[facility] day:  03/18/2018   Chief Complaint: Jimmy Hill. is a 77 y.o. 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  HPI:  The patient was last seen in the medical oncology clinic on 03/02/2018.  At that time, he wass fatigued. He had pain in his RIGHT abdomen that radiates upwards into chest wall.  He was not using pain medications as prescribed. He denied any B symptoms or recent infections. He remained on supplemental oxygen ATC. Neuropathy in hands and feet was stable. Exam was grossly unchanged from previous.  At last visit, we discussed results from [facility] which confirmed hepatocellular carcinoma.  We discussed oral TKI.  Side effects were reviewed.  We discussed pain management.  He was not using his oxycodone.  Echocardiogram on 03/05/2018 revealed an EF of 60-65%.  During the interim, patient is doing well overall. He notes that his pain has improved. He is using his pain medications more at this point. Patient using pain medication about "twice a day". He is accompanied by his [place] Meredith Staggers, MD) today who advises that they stopped his statin. With the resolution of his "breast pain", Dr. Meredith Staggers feels as if the statin was the culprit. Pain rated 0/10 in clinic today. Breathing stable on supplemental oxygen ATC.   Patient denies that he has experienced any B symptoms. He denies any interval infections. Patient advises that he maintains an adequate appetite. He is eating well. Weight today is 194 lb 3.2 oz (88.1 kg), which compared to his last visit to the clinic, represents a stable weight.    Patient denies pain in the clinic today.   Past Medical History:  Diagnosis Date  . Allergic rhinitis   . Anginal pain ([place])   . Arthritis   . Autonomic dysreflexia   . Back pain    CHRONIC LBP,STENOSIS  . CHF (congestive heart failure) ([place])   . Chronic kidney disease    STAGE 3  .  COPD (chronic obstructive pulmonary disease) ([place])   . Coronary artery disease   . Depression   . Diabetes ([place])   . Diabetes mellitus without complication ([place])   . DJD (degenerative joint disease)   . Dysphagia   . Dysrhythmia    A-fib, now converted  . Edema    FEET/ANKLES  . GERD (gastroesophageal reflux disease)   . Glaucoma   . H/O orthostatic hypotension   . Hypercholesterolemia   . Myocardial infarction ([place]) 2000  . Parkinson's disease ([place])   . Peripheral vascular disease ([place])   . Pneumonia    history of   . Polyneuropathy   . RLS (restless legs syndrome)   . Seizures ([place])    when in rehab for his hip  . Sleep apnea   . Smoker   . Spinal stenosis   . Strabismic amblyopia   . Tinnitus     Past Surgical History:  Procedure Laterality Date  . ANKLE SURGERY    . APPENDECTOMY    . BACK SURGERY    . CATARACT EXTRACTION W/PHACO Left 08/26/2016   Procedure: CATARACT EXTRACTION PHACO AND INTRAOCULAR LENS PLACEMENT (IOC);  Surgeon: Birder Robson, MD;  Location: [facility] ORS;  Service: Ophthalmology;  Laterality: Left;  [place] 01:37 AP% 19.4 CDE 18.97 fluid pack lot # [id number]  . CORONARY ANGIOPLASTY     STENT  . CTR    . ESOPHAGOGASTRODUODENOSCOPY (EGD) WITH PROPOFOL N/A 03/04/2016   Procedure:  ESOPHAGOGASTRODUODENOSCOPY (EGD) WITH PROPOFOL;  Surgeon: [name], MD;  Location: [facility] ENDOSCOPY;  Service: Endoscopy;  Laterality: N/A;  . EYE SURGERY     cataract  . FRACTURE SURGERY    . HIP PINNING,CANNULATED Left 12/04/2015   Procedure: CANNULATED HIP PINNING;  Surgeon: Thornton Park, MD;  Location: [facility] ORS;  Service: Orthopedics;  Laterality: Left;    Family History  Problem Relation Age of Onset  . Heart disease Father   . Diabetes Mellitus II Other     Social History:  reports that he quit smoking about 2 years ago. His smoking use included cigarettes. He smoked 0.50 packs per day. He has never used smokeless tobacco. He reports that he does not drink  alcohol or use drugs.  He smoked 2 packs/week x 60 years.  He is unaware of any exposures to radiation or toxins.  He is a retired Production designer, theatre/television/film.  He lives alone.  He has a wheelchair at his house (Parkinson's, diabetes with neuropathy).  He is able to perform is ADLs.  He has aides come out during the day.  His wife has dementia and lives in the [facility].  Daughter is [name]. The patient is accompanied by his aide Oneida Arenas) and [facility] physician Meredith Staggers, MD) today.   Allergies:  Allergies  Allergen Reactions  . Bupropion Other (See Comments)    Seizure  . Cheese     Patient doesn't want any cheese  . Sulfamethoxazole-Trimethoprim Other (See Comments)    Current Medications: Current Outpatient Medications  Medication Sig Dispense Refill  . albuterol (PROVENTIL HFA;VENTOLIN HFA) 108 (90 Base) MCG/ACT inhaler Inhale into the lungs every 6 (six) hours as needed for wheezing or shortness of breath.    Marland Kitchen albuterol (PROVENTIL) (2.5 MG/3ML) 0.083% nebulizer solution Take 2.5 mg by nebulization 4 (four) times daily as needed for wheezing or shortness of breath.    Marland Kitchen amiodarone (PACERONE) 100 MG tablet Take 100 mg by mouth daily.    Marland Kitchen atorvastatin (LIPITOR) 80 MG tablet Take 80 mg by mouth at bedtime.    . brimonidine-timolol (COMBIGAN) 0.2-0.5 % ophthalmic solution Place 1 drop into both eyes 2 (two) times daily.    . Calcium Carbonate-Vitamin D (CALCIUM 600+D PO) Take 1 tablet by mouth daily.    . carbidopa-levodopa (SINEMET IR) 25-250 MG tablet Take 2 tablets by mouth every morning (8AM), 1 tablet at noon, 1 tablet every afternoon (430PM) and 1 tablet at bedtime (10PM)    . Dextromethorphan-Guaifenesin (TUSSIN DM) 10-100 MG/5ML liquid Take 5 mLs by mouth every 6 (six) hours as needed (cough).     Marland Kitchen dextrose (GLUTOSE 15) 40 % GEL Take 1 Tube by mouth. For blood sugar less than 70, repeat in 5 minutes up to 2 doses     . ferrous sulfate 325 (65 FE) MG tablet Take 325 mg by mouth  daily with breakfast.    . FLUDROCORTISONE ACETATE PO Take 0.1 mg by mouth daily.    . insulin glargine (LANTUS) 100 UNIT/ML injection Inject 20 Units into the skin every morning.     . isosorbide mononitrate (ISMO,MONOKET) 10 MG tablet Take 10 mg by mouth 2 (two) times daily.    Marland Kitchen levETIRAcetam (KEPPRA) 500 MG tablet Take 500 mg by mouth 2 (two) times daily.    Marland Kitchen lidocaine (LIDODERM) 5 % Place 1 patch onto the skin daily. Remove & Discard patch within 12 hours or as directed by MD    . magnesium oxide (MAG-OX)  400 MG tablet Take 400 mg by mouth daily.    . metFORMIN (GLUCOPHAGE) 1000 MG tablet Take 1,000 mg by mouth 2 (two) times daily with a meal.     . metoprolol succinate (TOPROL-XL) 25 MG 24 hr tablet Take 25 mg by mouth daily.    . mirabegron ER (MYRBETRIQ) 50 MG TB24 tablet Take 50 mg by mouth daily.    . naloxone (NARCAN) nasal spray 4 mg/0.1 mL Place 1 spray into the nose.    . nitroGLYCERIN (NITROSTAT) 0.4 MG SL tablet Place 0.4 mg under the tongue every 5 (five) minutes as needed for chest pain.    . Oxycodone HCl 10 MG TABS Take 10 mg by mouth 2 (two) times daily as needed.    . pantoprazole (PROTONIX) 40 MG tablet Take 40 mg by mouth daily.    . potassium chloride (K-DUR,KLOR-CON) 10 MEQ tablet Take 20 mEq by mouth daily.    . predniSONE (DELTASONE) 10 MG tablet Take 5 tablets (50 mg total) by mouth daily with breakfast. Take 50 mg daily taper by 10 mg daily then stop (Patient taking differently: Take 20 mg by mouth daily with breakfast. Take 50 mg daily taper by 10 mg daily then stop) 15 tablet 0  . vitamin B-12 (CYANOCOBALAMIN) 1000 MCG tablet Take 1,000 mcg by mouth daily.    Marland Kitchen apixaban (ELIQUIS) 5 MG TABS tablet Take by mouth.    . Carboxymethylcellul-Glycerin (REFRESH OPTIVE OP) Apply 2 drops to eye every 8 (eight) hours as needed (dry eyes).    . pregabalin (LYRICA) 50 MG capsule Take 50 mg by mouth 2 (two) times daily.    Marland Kitchen senna-docusate (SENOKOT-S) 8.6-50 MG tablet Take by  mouth.    . traMADol (ULTRAM) 50 MG tablet Take 50 mg by mouth every 6 (six) hours as needed for moderate pain.     No current facility-administered medications for this visit.     Review of Systems  Constitutional: Negative for diaphoresis, fever, malaise/fatigue and weight loss (stable).  HENT: Negative.   Eyes: Negative.        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   Respiratory: Positive for cough (chronic) and shortness of breath (exertional). Negative for hemoptysis and sputum production.        Require supplemental oxygen ATC.  Cardiovascular: Negative for chest pain, palpitations, orthopnea, leg swelling and PND.  Gastrointestinal: Negative for abdominal pain, blood in stool, constipation, diarrhea, melena, nausea and vomiting.  Genitourinary: Negative for dysuria, frequency, hematuria and urgency.  Musculoskeletal: Positive for joint pain. Negative for back pain, falls and myalgias.  Skin: Negative for itching and rash.  Neurological: Positive for sensory change (Neuropathy in hands and feet). Negative for dizziness, tremors, weakness and headaches.  Endo/Heme/Allergies: Does not bruise/bleed easily.       PMH (+) for diabetes  Psychiatric/Behavioral: Negative for depression, memory loss and suicidal ideas. The patient is not nervous/anxious and does not have insomnia.   All other systems reviewed and are negative.  Performance status (ECOG): 2 - Symptomatic, <50% confined to bed  Vital Signs: BP (!) 91/57 (BP Location: Right Arm, Patient Position: Sitting)   Pulse 90   Temp (!) 96.9 F (36.1 C) (Tympanic)   Resp 18   Wt 194 lb 3.2 oz (88.1 kg)   SpO2 95%   BMI 29.53 kg/m   Physical Exam  Constitutional: He is oriented to person, place, and time. He appears unhealthy (Chronically ill-appearing). No  distress.  Chronically fatigued appearing gentleman sitting comfortably in the exam room in no acute distress.  HENT:  Head: Normocephalic and  atraumatic.  Mouth/Throat: Oropharynx is clear and moist and mucous membranes are normal. No oropharyngeal exudate.  Eyes: Pupils are equal, round, and reactive to light. EOM are normal. No scleral icterus.  Neck: Normal range of motion. Neck supple. No JVD present.  Cardiovascular: Normal rate, regular rhythm, normal heart sounds and intact distal pulses. Exam reveals no gallop and no friction rub.  No murmur heard. EKG showed: NSR at a rate of 86. (+) ST and T wave abnormality.  QTc prolonged at 564 ms.  Pulmonary/Chest: Breath sounds normal. No respiratory distress. He has no wheezes. He has no rales.  Supplemental oxygen in place via [place]  Abdominal: Soft. Bowel sounds are normal. He exhibits no distension and no mass. There is no abdominal tenderness. There is no rebound and no guarding.  Liver edge palpable.  Musculoskeletal: Normal range of motion.        General: No tenderness or edema.  Lymphadenopathy:    He has no cervical adenopathy.  Neurological: He is alert and oriented to person, place, and time.  Skin: Skin is warm and dry. No rash noted. He is not diaphoretic. No erythema.  Psychiatric: Mood, affect and judgment normal.  Nursing note and vitals reviewed.    Appointment on 03/18/2018  Component Date Value Ref Range Status  . Sodium 03/18/2018 140  135 - 145 mmol/L Final  . Potassium 03/18/2018 3.6  3.5 - 5.1 mmol/L Final  . Chloride 03/18/2018 101  98 - 111 mmol/L Final  . CO2 03/18/2018 29  22 - 32 mmol/L Final  . Glucose, Bld 03/18/2018 184* 70 - 99 mg/dL Final  . BUN 03/18/2018 13  8 - 23 mg/dL Final  . Creatinine, Ser 03/18/2018 1.20  0.61 - 1.24 mg/dL Final  . Calcium 03/18/2018 8.9  8.9 - 10.3 mg/dL Final  . Total Protein 03/18/2018 6.7  6.5 - 8.1 g/dL Final  . Albumin 03/18/2018 3.4* 3.5 - 5.0 g/dL Final  . AST 03/18/2018 29  15 - 41 U/L Final  . ALT 03/18/2018 7  0 - 44 U/L Final  . Alkaline Phosphatase 03/18/2018 62  38 - 126 U/L Final  . Total Bilirubin  03/18/2018 0.6  0.3 - 1.2 mg/dL Final  . GFR calc non Af Amer 03/18/2018 57* >60 mL/min Final  . GFR calc Af Amer 03/18/2018 >60  >60 mL/min Final   Comment: (NOTE) The eGFR has been calculated using the CKD EPI equation. This calculation has not been validated in all clinical situations. eGFR's persistently <60 mL/min signify possible Chronic Kidney Disease.   Georgiann Hahn gap 03/18/2018 10  5 - 15 Final   Performed at [facility], [address] [place], [place] [address]  . WBC 03/18/2018 9.0  4.0 - 10.5 K/uL Final  . RBC 03/18/2018 4.97  4.22 - 5.81 MIL/uL Final  . Hemoglobin 03/18/2018 13.5  13.0 - 17.0 g/dL Final  . HCT 03/18/2018 43.3  39.0 - 52.0 % Final  . MCV 03/18/2018 87.1  80.0 - 100.0 fL Final  . MCH 03/18/2018 27.2  26.0 - 34.0 pg Final  . MCHC 03/18/2018 31.2  30.0 - 36.0 g/dL Final  . RDW 03/18/2018 17.5* 11.5 - 15.5 % Final  . Platelets 03/18/2018 209  150 - 400 K/uL Final  . nRBC 03/18/2018 0.0  0.0 - 0.2 % Final  . Neutrophils Relative %  03/18/2018 61  % Final  . Neutro Abs 03/18/2018 5.5  1.7 - 7.7 K/uL Final  . Lymphocytes Relative 03/18/2018 24  % Final  . Lymphs Abs 03/18/2018 2.1  0.7 - 4.0 K/uL Final  . Monocytes Relative 03/18/2018 10  % Final  . Monocytes Absolute 03/18/2018 0.9  0.1 - 1.0 K/uL Final  . Eosinophils Relative 03/18/2018 4  % Final  . Eosinophils Absolute 03/18/2018 0.3  0.0 - 0.5 K/uL Final  . Basophils Relative 03/18/2018 1  % Final  . Basophils Absolute 03/18/2018 0.1  0.0 - 0.1 K/uL Final  . Immature Granulocytes 03/18/2018 0  % Final  . Abs Immature Granulocytes 03/18/2018 0.02  0.00 - 0.07 K/uL Final   Performed at [facility], [address], [place], [place] [address]  . TSH 03/18/2018 1.691  0.350 - 4.500 uIU/mL Final   Comment: Performed by a 3rd Generation assay with a functional sensitivity of <=0.01 uIU/mL. Performed at [facility], [address] [place], [place] [address]     Assessment:  Howell Groesbeck. is a 77 y.o. male with adenocarcinoma s/p ultrasound guided biopsy on 02/09/2018.  Pathology revealed adenocarcinoma.  Cytokeratin 7 (+), cytokeratin 20 (+), HepPar-1 (-), CD10 (-).  Differential diagnosis based on IHC staining includes primary intrahepatic cholangiocarcinoma and metastatic adenocarcinoma from gastric or pancreaticobiliary primary. Cancer TYPE ID testing sent on 02/12/2018.   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  Pancreatobiliary 5% (cholangiocacinoma, gallbladder adenocarcinoma, pancreatic adenocarcinoma) cannot be excluded.  CA19-9 was 253 (0-35).  CEA was 8.3.  AFP was 13.4.  Normal studies included:  hepatitis B core antibody total, hepatitis C antibody.  MRI abdomen without contrast on 01/18/2018 revealed significant enlargement of a lateral segment left liver lobe lesion (4.1 x 3.2 cm). There was new adjacent 3.6 x 3.3 cm gastrohepatic ligament adenopathy.  Differential considerations include hepatocellular carcinoma or metastatic disease from an unknown primary. There were smaller bilateral liver lesions which are also suspicious for hepatocellular carcinomas or metastatic disease.  PET scan on 01/28/2018 revealed hypermetabolism associated with a large ill-defined left hepatic lobe mass (SUV 7.0) as well as periportal/gastrohepatic ligament adenopathy (up to 1.5 cm: SUV 4.3).  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  There was aortic atherosclerosis, three-vessel coronary artery calcification and bilateral renal stones.  He has iron deficiency anemia.  Labs on 01/22/2018 revealed a ferritin 12 and an iron saturation of 9%. B12 and folate were normal.  Labs on 11/26/2017 revealed a normal creatinine and TSH.  Diet appears good.   EGD at [facility] on 04/17/2017 revealed diffuse atrophic mucosa in the entire stomach and multiple  localized erosions without bleeding.  Pathology revealed foveolar hyperplasia c/w reactive gastropathy.  Colonoscopy at [facility] on 04/17/2017 revealed two 5-7 mm polyps in the descending colon and transverse colon (adenomatous polyps).  Capsule study on 05/05/2017 revealed polyps x 2 in the proximal small bowel and AVMs x 2 in the terminal ileum and proximal small bowel.  Plan was anterograde and retrograde balloon enteroscopy at [facility].  Code status is DNI.  Symptomatically, he is doing well overall.  He notes that the pain in his abdomen has resolved with the discontinuation of his statin medication.  Additionally, patient is using his prescribed opioid medication, which is relieved his pain. He denies any B symptoms or interval infection.  He remains on  supplemental oxygen ATC.  Exam is grossly unremarkable.  WBC 9000 ([place]).  Glucose elevated 184 mg/dL.  Plan: 1. Labs today:  CBC with diff, CMP, TSH, UA 2. Hepatocellular carcinoma:  Review results of CancerTYPE ID with 90% probability hepatocellular carcinoma.  Patient is non operable (disease outside of liver and not a transplant candidate).   Phone follow-up with Dr. Leslie Andrea at [facility].   Pugh class A  Discuss treatment with lenvatinib therapy.  Discuss results of the initial phase I study (PR 37% and SD in 41%).  TTP was 7.8 months and OS was 18.7 months. Toxicity included hypertension 76%, hand-foot skin syndrome 65%, decreased appetite 61%, and proteinuria in 61%.  Discuss results of REFLECT study which compared lenvatinib to sorafenib in unresectable HCC.  OS was 13.6 vs 12.3 months.  ORR was 24% vs 9%, and  TTP was longer 7.4 vs 3.7 months.  Grade 3 or 4 hypertension was 23% vs 14%, hand-foot syndrome was 37% vs 52% (3% grade 3 or worse), and alopecia (3% vs 25%).  Discuss interval echo-normal.  EKG today showed prolonged QTc - will need cardiology evaluation PRIOR to initiation of lenvatinib.  If not tolerating oral  chemotherapy, will consider immunotherapy.  3. Iron deficiency anemia  CBC has improved.    No anemia or microcytosis.  Patient continues on oral iron daily with a source of vitamin C. 4. Pain symptom management  Pain has improved with the discontinuation of patient's statin medication by his PCP.  Patient using prescribed pain medications at this point.  Patient encouraged to maintain a pain diary for provider review.  Will consider LAO therapy if patient is requiring increased use of his short acting pain medication.  Continue to monitor. 5. RTC in 2 weeks after initiation of lenvatinib for MD assessment and labs (CBC with differential, CMP).   Honor Loh, NP  03/18/2018, 10:38 AM   I saw and evaluated the patient, participating in the key portions of the service and reviewing pertinent diagnostic studies and records.  I reviewed the nurse practitioner's note and agree with the findings and the plan.  The assessment and plan were discussed with the patient.  Several questions were asked by the patient and answered.   Nolon Stalls, MD 03/18/2018,10:38 AM

## 2018-03-18 NOTE — Progress Notes (Signed)
Patient here for follow up. Pt states he was having soreness to right side of breast for about 4 months, but it has resolved.

## 2018-03-23 ENCOUNTER — Telehealth: Payer: Self-pay | Admitting: Pharmacist

## 2018-03-23 ENCOUNTER — Other Ambulatory Visit: Payer: Self-pay | Admitting: Urgent Care

## 2018-03-23 MED ORDER — LENVATINIB (12 MG DAILY DOSE) 3 X 4 MG PO CPPK: 12.0000 mg | ORAL_CAPSULE | Freq: Every day | ORAL | 0 refills | Status: AC

## 2018-03-23 MED ORDER — LENVATINIB (12 MG DAILY DOSE) 3 X 4 MG PO CPPK: 12.0000 mg | ORAL_CAPSULE | Freq: Every day | ORAL | 0 refills | Status: DC

## 2018-03-23 NOTE — Telephone Encounter (Signed)
Oral Chemotherapy Pharmacist Encounter   Received a call from [facility] cardiologist wo is also the acting PCP for Mr. Belenda Cruise. She stated the patient was interested in starting [place]. They plan on stopping his amiodarone, pantoprazole, and mirabegron then rechecking his QTc. The are okay with proceeding with the Lenvima when his QTc is document less than 500. They will need a prescription/dosing for the Lenvima. The above information was given to Honor Loh.  Per [facility], they will follow-up with the office Monday 03/29/18   Darl Pikes, PharmD, BCPS, [facility] Clinical Pharmacist [facility]/AP Oral [facility] [phone]  03/23/2018 2:01 PM

## 2018-03-29 ENCOUNTER — Telehealth: Payer: Self-pay | Admitting: Pharmacy Technician

## 2018-03-29 NOTE — Telephone Encounter (Signed)
Dr Lutricia Feil from [facility] called today.  She states that Jimmy Hill's QT is under 500 and he will start [place] by 11/13 (it will take their pharmacy a couple days to get medication).  Dr Lutricia Feil said they will do a repeat EKG next week.    Patient has an appointment to see Dr Mike Gip on 11/14 for a 2 week assessment after starting Lenvima and Dr Lutricia Feil would like it rescheduled for 2 weeks from the 13th since he will have just started the medication.   [facility] [phone]  [facility] [place] Phone [phone] Fax [phone] 03/29/2018 2:57 PM

## 2018-04-01 ENCOUNTER — Inpatient Hospital Stay: Payer: No Typology Code available for payment source | Admitting: Hematology and Oncology

## 2018-04-01 ENCOUNTER — Telehealth: Payer: Self-pay | Admitting: *Deleted

## 2018-04-01 ENCOUNTER — Inpatient Hospital Stay: Payer: No Typology Code available for payment source

## 2018-04-01 NOTE — Telephone Encounter (Signed)
Per Rodena Piety 03/30/18 scheduler message to cancel and R/S lab/MD appt in 2 weeks. Called patient to make him aware that his lab/MD appt had been  R/S. Pt. Then told me to contact [facility], I spoke with Ammie Dalton and made  Her aware of the new date and time of his scheduled appt.

## 2018-04-06 ENCOUNTER — Telehealth: Payer: Self-pay | Admitting: Pharmacy Technician

## 2018-04-06 NOTE — Telephone Encounter (Signed)
Oral Oncology Patient Advocate Encounter  Dr Lutricia Feil from [facility] called yesterday.  She wanted to let us know that the patient has not started Lenvima yet due to his QTc level rising.  His level was at 530 recently and she would like his QTc to be at 450 or less before starting the medication.  She said that they will do another EKG.  If you would like to speak with her regarding this patient, [facility]'s number is [phone].

## 2018-04-12 ENCOUNTER — Other Ambulatory Visit: Payer: Self-pay | Admitting: Hematology and Oncology

## 2018-04-13 ENCOUNTER — Inpatient Hospital Stay: Payer: No Typology Code available for payment source | Admitting: Hematology and Oncology

## 2018-04-13 ENCOUNTER — Inpatient Hospital Stay: Payer: No Typology Code available for payment source

## 2018-04-13 ENCOUNTER — Other Ambulatory Visit: Payer: Self-pay | Admitting: Hematology and Oncology

## 2018-04-14 ENCOUNTER — Telehealth: Payer: Self-pay | Admitting: Hematology and Oncology

## 2018-04-14 NOTE — Telephone Encounter (Signed)
Re:  Diagnosis and treatment options  I spoke with Dr. Meredith Staggers on 3 separate occasions between 04/13/2018 and 04/14/2018. I had reviewed his case and findings with Dr. Leslie Andrea at [facility].  Dr. 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 He was initially planning on seeing Dr. Leamon Arnt to discuss possible enrollment on clinical trial (gemcitabine, cisplatin, Tecentriq, and halozyme).  However, the clinical trial closed today.  I discussed continuation of the second opinion with Dr. Leamon Arnt or proceeding with standard gemcitabine and cisplatin.  The patient wishes to proceed with chemotherapy.  Dr. Meredith Staggers will discuss possible port placement.  Lequita Asal, MD

## 2018-04-19 ENCOUNTER — Other Ambulatory Visit: Payer: Self-pay | Admitting: Family Medicine

## 2018-04-19 DIAGNOSIS — C220 Liver cell carcinoma: Secondary | ICD-10-CM

## 2018-04-20 ENCOUNTER — Other Ambulatory Visit: Payer: Self-pay | Admitting: Hematology and Oncology

## 2018-04-20 ENCOUNTER — Encounter: Payer: Self-pay | Admitting: Hematology and Oncology

## 2018-04-20 ENCOUNTER — Other Ambulatory Visit: Payer: Self-pay | Admitting: Family Medicine

## 2018-04-20 DIAGNOSIS — C221 Intrahepatic bile duct carcinoma: Secondary | ICD-10-CM

## 2018-04-20 DIAGNOSIS — C220 Liver cell carcinoma: Secondary | ICD-10-CM

## 2018-04-20 NOTE — Progress Notes (Signed)
START OFF PATHWAY REGIMEN - [Other Dx]   [id number]:Cisplatin 25 mg/m2 D1,8 + Gemcitabine 1,000 mg/m2 D1,8 q21 Days:   A cycle is every 21 days:     Gemcitabine      Cisplatin   **Always confirm dose/schedule in your pharmacy ordering system**  Patient Characteristics: Intent of Therapy: Non-Curative / Palliative Intent, Discussed with Patient 

## 2018-04-23 ENCOUNTER — Ambulatory Visit
Admission: RE | Admit: 2018-04-23 | Discharge: 2018-04-23 | Disposition: A | Discharge status: Home / Self Care | Payer: No Typology Code available for payment source | Source: Ambulatory Visit | Attending: Family Medicine | Admitting: Family Medicine

## 2018-04-23 DIAGNOSIS — C220 Liver cell carcinoma: Secondary | ICD-10-CM | POA: Insufficient documentation

## 2018-04-23 DIAGNOSIS — Z87442 Personal history of urinary calculi: Secondary | ICD-10-CM | POA: Diagnosis not present

## 2018-04-23 DIAGNOSIS — C778 Secondary and unspecified malignant neoplasm of lymph nodes of multiple regions: Secondary | ICD-10-CM | POA: Diagnosis not present

## 2018-04-23 DIAGNOSIS — I81 Portal vein thrombosis: Secondary | ICD-10-CM | POA: Diagnosis not present

## 2018-04-23 MED ORDER — GADOBUTROL 1 MMOL/ML IV SOLN
8.0000 mL | Freq: Once | INTRAVENOUS | Status: AC | PRN
  Administered 2018-04-23: 8 mL via INTRAVENOUS

## 2018-04-25 ENCOUNTER — Other Ambulatory Visit: Payer: Self-pay | Admitting: Urgent Care

## 2018-04-25 DIAGNOSIS — C220 Liver cell carcinoma: Secondary | ICD-10-CM

## 2018-04-26 ENCOUNTER — Other Ambulatory Visit (INDEPENDENT_AMBULATORY_CARE_PROVIDER_SITE_OTHER): Payer: Self-pay | Admitting: Vascular Surgery

## 2018-04-26 ENCOUNTER — Telehealth (INDEPENDENT_AMBULATORY_CARE_PROVIDER_SITE_OTHER): Payer: Self-pay

## 2018-04-26 ENCOUNTER — Telehealth: Payer: Self-pay | Admitting: *Deleted

## 2018-04-26 NOTE — Patient Instructions (Signed)
Cisplatin injection What is this medicine? CISPLATIN (SIS pla tin) is a chemotherapy drug. It targets fast dividing cells, like cancer cells, and causes these cells to die. This medicine is used to treat many types of cancer like bladder, ovarian, and testicular cancers. This medicine may be used for other purposes; ask your health care provider or pharmacist if you have questions. COMMON BRAND NAME(S): Platinol, Platinol -AQ What should I tell my health care provider before I take this medicine? They need to know if you have any of these conditions: -blood disorders -hearing problems -kidney disease -recent or ongoing radiation therapy -an unusual or allergic reaction to cisplatin, carboplatin, other chemotherapy, other medicines, foods, dyes, or preservatives -pregnant or trying to get pregnant -breast-feeding How should I use this medicine? This drug is given as an infusion into a vein. It is administered in a hospital or clinic by a specially trained health care professional. Talk to your pediatrician regarding the use of this medicine in children. Special care may be needed. Overdosage: If you think you have taken too much of this medicine contact a poison control center or emergency room at once. NOTE: This medicine is only for you. Do not share this medicine with others. What if I miss a dose? It is important not to miss a dose. Call your doctor or health care professional if you are unable to keep an appointment. What may interact with this medicine? -dofetilide -foscarnet -medicines for seizures -medicines to increase blood counts like filgrastim, pegfilgrastim, sargramostim -probenecid -pyridoxine used with altretamine -rituximab -some antibiotics like amikacin, gentamicin, neomycin, polymyxin B, streptomycin, tobramycin -sulfinpyrazone -vaccines -zalcitabine Talk to your doctor or health care professional before taking any of these  medicines: -acetaminophen -aspirin -ibuprofen -ketoprofen -naproxen This list may not describe all possible interactions. Give your health care provider a list of all the medicines, herbs, non-prescription drugs, or dietary supplements you use. Also tell them if you smoke, drink alcohol, or use illegal drugs. Some items may interact with your medicine. What should I watch for while using this medicine? Your condition will be monitored carefully while you are receiving this medicine. You will need important blood work done while you are taking this medicine. This drug may make you feel generally unwell. This is not uncommon, as chemotherapy can affect healthy cells as well as cancer cells. Report any side effects. Continue your course of treatment even though you feel ill unless your doctor tells you to stop. In some cases, you may be given additional medicines to help with side effects. Follow all directions for their use. Call your doctor or health care professional for advice if you get a fever, chills or sore throat, or other symptoms of a cold or flu. Do not treat yourself. This drug decreases your body's ability to fight infections. Try to avoid being around people who are sick. This medicine may increase your risk to bruise or bleed. Call your doctor or health care professional if you notice any unusual bleeding. Be careful brushing and flossing your teeth or using a toothpick because you may get an infection or bleed more easily. If you have any dental work done, tell your dentist you are receiving this medicine. Avoid taking products that contain aspirin, acetaminophen, ibuprofen, naproxen, or ketoprofen unless instructed by your doctor. These medicines may hide a fever. Do not become pregnant while taking this medicine. Women should inform their doctor if they wish to become pregnant or think they might be pregnant. There is a   potential for serious side effects to an unborn child. Talk to  your health care professional or pharmacist for more information. Do not breast-feed an infant while taking this medicine. Drink fluids as directed while you are taking this medicine. This will help protect your kidneys. Call your doctor or health care professional if you get diarrhea. Do not treat yourself. What side effects may I notice from receiving this medicine? Side effects that you should report to your doctor or health care professional as soon as possible: -allergic reactions like skin rash, itching or hives, swelling of the face, lips, or tongue -signs of infection - fever or chills, cough, sore throat, pain or difficulty passing urine -signs of decreased platelets or bleeding - bruising, pinpoint red spots on the skin, black, tarry stools, nosebleeds -signs of decreased red blood cells - unusually weak or tired, fainting spells, lightheadedness -breathing problems -changes in hearing -gout pain -low blood counts - This drug may decrease the number of white blood cells, red blood cells and platelets. You may be at increased risk for infections and bleeding. -nausea and vomiting -pain, swelling, redness or irritation at the injection site -pain, tingling, numbness in the hands or feet -problems with balance, movement -trouble passing urine or change in the amount of urine Side effects that usually do not require medical attention (report to your doctor or health care professional if they continue or are bothersome): -changes in vision -loss of appetite -metallic taste in the mouth or changes in taste This list may not describe all possible side effects. Call your doctor for medical advice about side effects. You may report side effects to FDA at 1-800-FDA-1088. Where should I keep my medicine? This drug is given in a hospital or clinic and will not be stored at home. NOTE: This sheet is a summary. It may not cover all possible information. If you have questions about this medicine,  talk to your doctor, pharmacist, or health care provider.  2018 [employer]vier/Gold Standard (2007-08-10 14:40:54) Gemcitabine injection What is this medicine? GEMCITABINE (jem SIT a been) is a chemotherapy drug. This medicine is used to treat many types of cancer like breast cancer, lung cancer, pancreatic cancer, and ovarian cancer. This medicine may be used for other purposes; ask your health care provider or pharmacist if you have questions. COMMON BRAND NAME(S): Gemzar What should I tell my health care provider before I take this medicine? They need to know if you have any of these conditions: -blood disorders -infection -kidney disease -liver disease -recent or ongoing radiation therapy -an unusual or allergic reaction to gemcitabine, other chemotherapy, other medicines, foods, dyes, or preservatives -pregnant or trying to get pregnant -breast-feeding How should I use this medicine? This drug is given as an infusion into a vein. It is administered in a hospital or clinic by a specially trained health care professional. Talk to your pediatrician regarding the use of this medicine in children. Special care may be needed. Overdosage: If you think you have taken too much of this medicine contact a poison control center or emergency room at once. NOTE: This medicine is only for you. Do not share this medicine with others. What if I miss a dose? It is important not to miss your dose. Call your doctor or health care professional if you are unable to keep an appointment. What may interact with this medicine? -medicines to increase blood counts like filgrastim, pegfilgrastim, sargramostim -some other chemotherapy drugs like cisplatin -vaccines Talk to your doctor or health   care professional before taking any of these medicines: -acetaminophen -aspirin -ibuprofen -ketoprofen -naproxen This list may not describe all possible interactions. Give your health care provider a list of all the  medicines, herbs, non-prescription drugs, or dietary supplements you use. Also tell them if you smoke, drink alcohol, or use illegal drugs. Some items may interact with your medicine. What should I watch for while using this medicine? Visit your doctor for checks on your progress. This drug may make you feel generally unwell. This is not uncommon, as chemotherapy can affect healthy cells as well as cancer cells. Report any side effects. Continue your course of treatment even though you feel ill unless your doctor tells you to stop. In some cases, you may be given additional medicines to help with side effects. Follow all directions for their use. Call your doctor or health care professional for advice if you get a fever, chills or sore throat, or other symptoms of a cold or flu. Do not treat yourself. This drug decreases your body's ability to fight infections. Try to avoid being around people who are sick. This medicine may increase your risk to bruise or bleed. Call your doctor or health care professional if you notice any unusual bleeding. Be careful brushing and flossing your teeth or using a toothpick because you may get an infection or bleed more easily. If you have any dental work done, tell your dentist you are receiving this medicine. Avoid taking products that contain aspirin, acetaminophen, ibuprofen, naproxen, or ketoprofen unless instructed by your doctor. These medicines may hide a fever. Women should inform their doctor if they wish to become pregnant or think they might be pregnant. There is a potential for serious side effects to an unborn child. Talk to your health care professional or pharmacist for more information. Do not breast-feed an infant while taking this medicine. What side effects may I notice from receiving this medicine? Side effects that you should report to your doctor or health care professional as soon as possible: -allergic reactions like skin rash, itching or hives,  swelling of the face, lips, or tongue -low blood counts - this medicine may decrease the number of white blood cells, red blood cells and platelets. You may be at increased risk for infections and bleeding. -signs of infection - fever or chills, cough, sore throat, pain or difficulty passing urine -signs of decreased platelets or bleeding - bruising, pinpoint red spots on the skin, black, tarry stools, blood in the urine -signs of decreased red blood cells - unusually weak or tired, fainting spells, lightheadedness -breathing problems -chest pain -mouth sores -nausea and vomiting -pain, swelling, redness at site where injected -pain, tingling, numbness in the hands or feet -stomach pain -swelling of ankles, feet, hands -unusual bleeding Side effects that usually do not require medical attention (report to your doctor or health care professional if they continue or are bothersome): -constipation -diarrhea -hair loss -loss of appetite -stomach upset This list may not describe all possible side effects. Call your doctor for medical advice about side effects. You may report side effects to FDA at 1-800-FDA-1088. Where should I keep my medicine? This drug is given in a hospital or clinic and will not be stored at home. NOTE: This sheet is a summary. It may not cover all possible information. If you have questions about this medicine, talk to your doctor, pharmacist, or health care provider.  2018 [employer]vier/Gold Standard (2007-09-14 18:45:54)  

## 2018-04-26 NOTE — Telephone Encounter (Signed)
Worked with Dr. Meredith Staggers and scheduled the patient for a port placement for 04/28/18. Dr. Meredith Staggers was given the information and will get it approved through [facility].

## 2018-04-26 NOTE — Telephone Encounter (Signed)
Called patient and made him aware of the appts per [facility] 04/25/18 staff message to schedule pt For chemo class ASAP for cisplatin + gemcitabine also to move his MD appt from 04/29/18 to 04/30/18 Pt is aware. I also called [facility] and spoke with Ebony to update her on the changes that were made to his schedule.   [facility] is also aware of the scheduled appts for this week.

## 2018-04-27 ENCOUNTER — Telehealth: Payer: Self-pay | Admitting: *Deleted

## 2018-04-27 ENCOUNTER — Other Ambulatory Visit: Payer: Self-pay | Admitting: Hematology and Oncology

## 2018-04-27 ENCOUNTER — Inpatient Hospital Stay (HOSPITAL_BASED_OUTPATIENT_CLINIC_OR_DEPARTMENT_OTHER): Payer: Medicare (Managed Care) | Admitting: Hospice and Palliative Medicine

## 2018-04-27 ENCOUNTER — Inpatient Hospital Stay: Payer: Medicare (Managed Care) | Attending: Hematology and Oncology

## 2018-04-27 VITALS — BP 122/83 | HR 76 | Temp 94.7°F | Resp 18

## 2018-04-27 DIAGNOSIS — Z5111 Encounter for antineoplastic chemotherapy: Secondary | ICD-10-CM | POA: Insufficient documentation

## 2018-04-27 DIAGNOSIS — D696 Thrombocytopenia, unspecified: Secondary | ICD-10-CM | POA: Diagnosis not present

## 2018-04-27 DIAGNOSIS — J449 Chronic obstructive pulmonary disease, unspecified: Secondary | ICD-10-CM | POA: Insufficient documentation

## 2018-04-27 DIAGNOSIS — G20 Parkinson's disease: Secondary | ICD-10-CM | POA: Diagnosis not present

## 2018-04-27 DIAGNOSIS — F329 Major depressive disorder, single episode, unspecified: Secondary | ICD-10-CM | POA: Insufficient documentation

## 2018-04-27 DIAGNOSIS — C221 Intrahepatic bile duct carcinoma: Secondary | ICD-10-CM

## 2018-04-27 DIAGNOSIS — Z79899 Other long term (current) drug therapy: Secondary | ICD-10-CM | POA: Insufficient documentation

## 2018-04-27 DIAGNOSIS — D509 Iron deficiency anemia, unspecified: Secondary | ICD-10-CM

## 2018-04-27 DIAGNOSIS — G629 Polyneuropathy, unspecified: Secondary | ICD-10-CM | POA: Diagnosis not present

## 2018-04-27 DIAGNOSIS — E1122 Type 2 diabetes mellitus with diabetic chronic kidney disease: Secondary | ICD-10-CM | POA: Diagnosis not present

## 2018-04-27 DIAGNOSIS — I252 Old myocardial infarction: Secondary | ICD-10-CM | POA: Insufficient documentation

## 2018-04-27 DIAGNOSIS — N183 Chronic kidney disease, stage 3 (moderate): Secondary | ICD-10-CM | POA: Insufficient documentation

## 2018-04-27 DIAGNOSIS — E860 Dehydration: Secondary | ICD-10-CM | POA: Insufficient documentation

## 2018-04-27 DIAGNOSIS — C220 Liver cell carcinoma: Secondary | ICD-10-CM

## 2018-04-27 DIAGNOSIS — Z87891 Personal history of nicotine dependence: Secondary | ICD-10-CM

## 2018-04-27 MED ORDER — ONDANSETRON HCL 8 MG PO TABS: 8.0000 mg | ORAL_TABLET | Freq: Two times a day (BID) | ORAL | 1 refills | Status: AC | PRN

## 2018-04-27 MED ORDER — DEXAMETHASONE 4 MG PO TABS: ORAL_TABLET | ORAL | 1 refills | Status: AC

## 2018-04-27 MED ORDER — PROCHLORPERAZINE MALEATE 10 MG PO TABS: 10.0000 mg | ORAL_TABLET | Freq: Four times a day (QID) | ORAL | 1 refills | Status: AC | PRN

## 2018-04-27 MED ORDER — LIDOCAINE-PRILOCAINE 2.5-2.5 % EX CREA: TOPICAL_CREAM | CUTANEOUS | 3 refills | Status: AC

## 2018-04-27 NOTE — Progress Notes (Signed)
[facility]  Telephone:([phone] [phone]  Patient Care Team: Inc, [place] as PCP - General Leticia Penna, MD as Referring Physician (Family Medicine) Clent Jacks, RN as Registered Nurse   Name of the patient: Jimmy Hill  [id number]  1940-06-30   Date of Visit: 04/27/18  Diagnosis: [place]  Current Treatment: Gemcitabine, Cisplatin   Reason for Visit: This patient is a 77 y.o. male who presents to chemo care clinic today for initial meeting in preparation for starting chemotherapy. I introduced the chemo care clinic and we discussed that the role of the clinic is to assist those who are at an increased risk of emergency room visits and/or complications during the course of chemotherapy treatment. We discussed that the increased risk takes into account factors such as age, performance status, and co-morbidities. We also discussed that for some, this might include barriers to care such as not having a primary care provider, lack of insurance/transportation, or not being able to afford medications. We discussed that the goal of the program is to help prevent unplanned ER visits and help reduce complications during chemotherapy. We do this by discussing specific risk factors to each individual and identifying ways that we can help improve these risk factors and reduce barriers to care.   Hematology/Oncology History:    Cholangiocarcinoma ([place])   04/20/2018 Initial Diagnosis    Cholangiocarcinoma ([place])    04/20/2018 -  Chemotherapy    The patient had palonosetron (ALOXI) injection 0.25 mg, 0.25 mg, Intravenous,  Once, 0 of 4 cycles pegfilgrastim-cbqv (UDENYCA) injection 6 mg, 6 mg, Subcutaneous, Once, 0 of 4 cycles CISplatin (PLATINOL) 52 mg in sodium chloride 0.9 % 250 mL chemo infusion, 25 mg/m2 = 52 mg, Intravenous,  Once, 0 of 4 cycles gemcitabine (GEMZAR) 2,052 mg in sodium  chloride 0.9 % 250 mL chemo infusion, 1,000 mg/m2 = 2,052 mg, Intravenous,  Once, 0 of 4 cycles fosaprepitant (EMEND) 150 mg, dexamethasone (DECADRON) 12 mg in sodium chloride 0.9 % 145 mL IVPB, , Intravenous,  Once, 0 of 4 cycles  for chemotherapy treatment.       Allergies  Allergen Reactions  . Bupropion Other (See Comments)    Seizure  . Cheese     Patient doesn't want any cheese  . Sulfamethoxazole-Trimethoprim Other (See Comments)     Past Medical History:  Diagnosis Date  . Allergic rhinitis   . Anginal pain ([place])   . Arthritis   . Autonomic dysreflexia   . Back pain    CHRONIC LBP,STENOSIS  . CHF (congestive heart failure) ([place])   . Chronic kidney disease    STAGE 3  . COPD (chronic obstructive pulmonary disease) ([place])   . Coronary artery disease   . Depression   . Diabetes ([place])   . Diabetes mellitus without complication ([place])   . DJD (degenerative joint disease)   . Dysphagia   . Dysrhythmia    A-fib, now converted  . Edema    FEET/ANKLES  . GERD (gastroesophageal reflux disease)   . Glaucoma   . H/O orthostatic hypotension   . Hypercholesterolemia   . Myocardial infarction ([place]) 2000  . Parkinson's disease ([place])   . Peripheral vascular disease ([place])   . Pneumonia    history of   . Polyneuropathy   . RLS (restless legs syndrome)   . Seizures ([place])    when in rehab for his hip  . Sleep apnea   .  Smoker   . Spinal stenosis   . Strabismic amblyopia   . Tinnitus      Past Surgical History:  Procedure Laterality Date  . ANKLE SURGERY    . APPENDECTOMY    . BACK SURGERY    . CATARACT EXTRACTION W/PHACO Left 08/26/2016   Procedure: CATARACT EXTRACTION PHACO AND INTRAOCULAR LENS PLACEMENT (IOC);  Surgeon: Birder Robson, MD;  Location: [facility] ORS;  Service: Ophthalmology;  Laterality: Left;  [place] 01:37 AP% 19.4 CDE 18.97 fluid pack lot # [id number]  . CORONARY ANGIOPLASTY     STENT  . CTR    . ESOPHAGOGASTRODUODENOSCOPY (EGD) WITH PROPOFOL N/A  03/04/2016   Procedure: ESOPHAGOGASTRODUODENOSCOPY (EGD) WITH PROPOFOL;  Surgeon: [name], MD;  Location: [facility] ENDOSCOPY;  Service: Endoscopy;  Laterality: N/A;  . EYE SURGERY     cataract  . FRACTURE SURGERY    . HIP PINNING,CANNULATED Left 12/04/2015   Procedure: CANNULATED HIP PINNING;  Surgeon: Thornton Park, MD;  Location: [facility] ORS;  Service: Orthopedics;  Laterality: Left;    Social History   Socioeconomic History  . Marital status: Married    Spouse name: Not on file  . Number of children: Not on file  . Years of education: Not on file  . Highest education level: Not on file  Occupational History  . Occupation: retired  Scientific laboratory technician  . Financial resource strain: Not on file  . Food insecurity:    Worry: Not on file    Inability: Not on file  . Transportation needs:    Medical: Not on file    Non-medical: Not on file  Tobacco Use  . Smoking status: Former Smoker    Packs/day: 0.50    Types: Cigarettes    Last attempt to quit: 11/17/2015    Years since quitting: 2.4  . Smokeless tobacco: Never Used  Substance and Sexual Activity  . Alcohol use: No  . Drug use: No  . Sexual activity: Never  Lifestyle  . Physical activity:    Days per week: Not on file    Minutes per session: Not on file  . Stress: Not on file  Relationships  . Social connections:    Talks on phone: Not on file    Gets together: Not on file    Attends religious service: Not on file    Active member of club or organization: Not on file    Attends meetings of clubs or organizations: Not on file    Relationship status: Not on file  . Intimate partner violence:    Fear of current or ex partner: Not on file    Emotionally abused: Not on file    Physically abused: Not on file    Forced sexual activity: Not on file  Other Topics Concern  . Not on file  Social History Narrative  . Not on file    Family History  Problem Relation Age of Onset  . Heart disease Father   . Diabetes Mellitus  II Other     Current Outpatient Medications  Medication Sig Dispense Refill  . albuterol (PROVENTIL HFA;VENTOLIN HFA) 108 (90 Base) MCG/ACT inhaler Inhale into the lungs every 6 (six) hours as needed for wheezing or shortness of breath.    Marland Kitchen albuterol (PROVENTIL) (2.5 MG/3ML) 0.083% nebulizer solution Take 2.5 mg by nebulization 4 (four) times daily as needed for wheezing or shortness of breath.    Marland Kitchen amiodarone (PACERONE) 100 MG tablet Take 100 mg by mouth daily.    Marland Kitchen  apixaban (ELIQUIS) 5 MG TABS tablet Take by mouth.    Marland Kitchen atorvastatin (LIPITOR) 80 MG tablet Take 80 mg by mouth at bedtime.    . brimonidine-timolol (COMBIGAN) 0.2-0.5 % ophthalmic solution Place 1 drop into both eyes 2 (two) times daily.    . Calcium Carbonate-Vitamin D (CALCIUM 600+D PO) Take 1 tablet by mouth daily.    . carbidopa-levodopa (SINEMET IR) 25-250 MG tablet Take 2 tablets by mouth every morning (8AM), 1 tablet at noon, 1 tablet every afternoon (430PM) and 1 tablet at bedtime (10PM)    . Carboxymethylcellul-Glycerin (REFRESH OPTIVE OP) Apply 2 drops to eye every 8 (eight) hours as needed (dry eyes).    . Dextromethorphan-Guaifenesin (TUSSIN DM) 10-100 MG/5ML liquid Take 5 mLs by mouth every 6 (six) hours as needed (cough).     Marland Kitchen dextrose (GLUTOSE 15) 40 % GEL Take 1 Tube by mouth. For blood sugar less than 70, repeat in 5 minutes up to 2 doses     . ferrous sulfate 325 (65 FE) MG tablet Take 325 mg by mouth daily with breakfast.    . FLUDROCORTISONE ACETATE PO Take 0.1 mg by mouth daily.    . insulin glargine (LANTUS) 100 UNIT/ML injection Inject 20 Units into the skin every morning.     . isosorbide mononitrate (ISMO,MONOKET) 10 MG tablet Take 10 mg by mouth 2 (two) times daily.    . Lenvatinib 12 mg daily dose (LENVIMA) 3 x 4 MG capsule Take 12 mg by mouth daily. 90 capsule 0  . levETIRAcetam (KEPPRA) 500 MG tablet Take 500 mg by mouth 2 (two) times daily.    Marland Kitchen lidocaine (LIDODERM) 5 % Place 1 patch onto the skin  daily. Remove & Discard patch within 12 hours or as directed by MD    . magnesium oxide (MAG-OX) 400 MG tablet Take 400 mg by mouth daily.    . metFORMIN (GLUCOPHAGE) 1000 MG tablet Take 1,000 mg by mouth 2 (two) times daily with a meal.     . metoprolol succinate (TOPROL-XL) 25 MG 24 hr tablet Take 25 mg by mouth daily.    . mirabegron ER (MYRBETRIQ) 50 MG TB24 tablet Take 50 mg by mouth daily.    . naloxone (NARCAN) nasal spray 4 mg/0.1 mL Place 1 spray into the nose.    . nitroGLYCERIN (NITROSTAT) 0.4 MG SL tablet Place 0.4 mg under the tongue every 5 (five) minutes as needed for chest pain.    . Oxycodone HCl 10 MG TABS Take 10 mg by mouth 2 (two) times daily as needed.    . pantoprazole (PROTONIX) 40 MG tablet Take 40 mg by mouth daily.    . potassium chloride (K-DUR,KLOR-CON) 10 MEQ tablet Take 20 mEq by mouth daily.    . predniSONE (DELTASONE) 10 MG tablet Take 5 tablets (50 mg total) by mouth daily with breakfast. Take 50 mg daily taper by 10 mg daily then stop (Patient taking differently: Take 20 mg by mouth daily with breakfast. Take 50 mg daily taper by 10 mg daily then stop) 15 tablet 0  . pregabalin (LYRICA) 50 MG capsule Take 50 mg by mouth 2 (two) times daily.    Marland Kitchen senna-docusate (SENOKOT-S) 8.6-50 MG tablet Take by mouth.    . traMADol (ULTRAM) 50 MG tablet Take 50 mg by mouth every 6 (six) hours as needed for moderate pain.    . vitamin B-12 (CYANOCOBALAMIN) 1000 MCG tablet Take 1,000 mcg by mouth daily.     No  current facility-administered medications for this visit.      PERFORMANCE STATUS (ECOG) : 3 - Symptomatic, >50% confined to bed  Review of Systems As noted above. Otherwise, a complete review of systems is negative.  Physical Exam General: NAD, frail appearing, thin, in wheelchair Cardiovascular: regular rate and rhythm  Pulmonary: clear ant fields, on O2 Abdomen: soft, nontender, + bowel sounds GU: no suprapubic tenderness Extremities: no edema, no joint  deformities Skin: no rashes Neurological: Weakness but otherwise nonfocal   Assessment and Plan:    1. Cancer: HCC currently being managed with gemcitabine and cisplatin  2. High Risk for ER/Hospitalization during Chemotherapy: We discussed the role of the chemo care clinic and identified patient specific risk factors. I discussed that patient was identified as high risk primarily based on: multiple comorbidities. We also discussed the role of the Symptom Management and Palliative Care Clinics at [facility] and methods of contacting clinic/provider. He denies needing specific assistance at this time.  Current PCP: [facility] Admissions: 3  ED Visits: 1  Has Medicaid: Yes  Has Medicare: Yes  In relationship: Yes  Has Anemia: Yes  Has asthma: No  Has atrial fibrillation: Yes  Has CVD: Yes  Has chronic kidney disease: Yes  Has Chronic Obstructive Pulmonary Disease: Yes  Has Congestive Heart Failure: No  Has Connective Tissue Disorder: No  Has Depression: Yes  Has Diabetes: Yes  Has liver disease: Yes  Has Peripheral Vascular Disease: No    3. Social Determinants of Health:   Housing - lives at home alone. [facility] and has in home care. [facility] also goes to [facility] three times per week.   Food - Patient is not eating well but says he has access to food. We discussed resources in the [facility] such as the food bank.   Transportation - He has access to transportation to/from appointments.   Utilities - He denies having any concerns. He has heat and other utilities in his apartment.   Safety - He reports feeling safe in his home.   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 Patient's wife has dementia and is a resident in memory care at [facility]. He has a biological daughter that has MR. Patient has a step-daughter, [name], who is  involved.   4. Co-morbidities Complicating Care: Patient has multiple comorbidities, which could potentially complicate his treatment/care. He has COPD (last hospitalized for exacerbation in 09/2017), OSA, CKD III, DM, CAD with h/o MI, PVD, Parkinson's dz, dysphagia, and h/o seizures. Patient is being followed closely by [facility], who are managing his medications.   Patient complains of neuropathic pain. Meds reviewed with patient. He was not sure if he was currently taking Lyrica and per our list this was previously discontinued. I called and spoke with his PCP. Patient is taking 100mg  nightly. Consider titrating for effect. He might also benefit from addition of Cymbalta. Use of capsaicin cream could also be considered.   Patient is currently taking oxycodone IR BID. However, he finds that the action is short-lived. I explained the difference between short and long acting opioids. Would recommend OxyContin. His PCP has also discussed this with patient but patient had previously been unwilling to switch.   Patient reports poor oral intake. He has not been consistently using oral supplements. Will refer to our dietician.   Patient  expressed understanding and was in agreement with this plan. He also understands that He can call clinic at any time with any questions, concerns, or complaints.   A total of (35) minutes of face-to-face time was spent with this patient with greater than 50% of that time in counseling and care-coordination.   Signed by: Altha Harm, PhD, DNP, NP-C, [facility] [phone] (Work Cell)

## 2018-04-27 NOTE — Progress Notes (Signed)
Patient here today for palliative visit.  States he has no appetite.  Feet are burning "like fire". Gets nauseated when he thinks about food, however, is eating a bag of potato chips and drinking ensure in the room. Sleeping okay.

## 2018-04-27 NOTE — Telephone Encounter (Signed)
  Will do.  M

## 2018-04-27 NOTE — Telephone Encounter (Signed)
Can you please go ahead and release patient pre medications as his port is being placed tomorrow and his chemotherapy is Friday

## 2018-04-27 NOTE — Telephone Encounter (Signed)
Called patient to inform him that his appointment for port placement is tomorrow, 04-28-18 @ 2 PM with Dr. Franchot Gallo.  Patent to arrive @ 1:45.  Patient verbalized understanding.

## 2018-04-28 ENCOUNTER — Ambulatory Visit
Admission: RE | Admit: 2018-04-28 | Discharge: 2018-04-28 | Disposition: A | Discharge status: Home / Self Care | Payer: No Typology Code available for payment source | Attending: Vascular Surgery | Admitting: Vascular Surgery

## 2018-04-28 ENCOUNTER — Encounter: Payer: Self-pay | Admitting: *Deleted

## 2018-04-28 ENCOUNTER — Encounter
Admission: RE | Disposition: A | Discharge status: Home / Self Care | Payer: Self-pay | Source: Home / Self Care | Attending: Vascular Surgery

## 2018-04-28 ENCOUNTER — Other Ambulatory Visit: Payer: Self-pay

## 2018-04-28 DIAGNOSIS — G2581 Restless legs syndrome: Secondary | ICD-10-CM | POA: Diagnosis not present

## 2018-04-28 DIAGNOSIS — E1142 Type 2 diabetes mellitus with diabetic polyneuropathy: Secondary | ICD-10-CM | POA: Diagnosis not present

## 2018-04-28 DIAGNOSIS — E7800 Pure hypercholesterolemia, unspecified: Secondary | ICD-10-CM | POA: Diagnosis not present

## 2018-04-28 DIAGNOSIS — Z8249 Family history of ischemic heart disease and other diseases of the circulatory system: Secondary | ICD-10-CM | POA: Insufficient documentation

## 2018-04-28 DIAGNOSIS — Z882 Allergy status to sulfonamides status: Secondary | ICD-10-CM | POA: Diagnosis not present

## 2018-04-28 DIAGNOSIS — Z888 Allergy status to other drugs, medicaments and biological substances status: Secondary | ICD-10-CM | POA: Diagnosis not present

## 2018-04-28 DIAGNOSIS — Z955 Presence of coronary angioplasty implant and graft: Secondary | ICD-10-CM | POA: Insufficient documentation

## 2018-04-28 DIAGNOSIS — Z8679 Personal history of other diseases of the circulatory system: Secondary | ICD-10-CM | POA: Insufficient documentation

## 2018-04-28 DIAGNOSIS — Z794 Long term (current) use of insulin: Secondary | ICD-10-CM | POA: Diagnosis not present

## 2018-04-28 DIAGNOSIS — M1990 Unspecified osteoarthritis, unspecified site: Secondary | ICD-10-CM | POA: Insufficient documentation

## 2018-04-28 DIAGNOSIS — K219 Gastro-esophageal reflux disease without esophagitis: Secondary | ICD-10-CM | POA: Insufficient documentation

## 2018-04-28 DIAGNOSIS — G4733 Obstructive sleep apnea (adult) (pediatric): Secondary | ICD-10-CM | POA: Insufficient documentation

## 2018-04-28 DIAGNOSIS — Z7901 Long term (current) use of anticoagulants: Secondary | ICD-10-CM | POA: Insufficient documentation

## 2018-04-28 DIAGNOSIS — C220 Liver cell carcinoma: Secondary | ICD-10-CM

## 2018-04-28 DIAGNOSIS — N183 Chronic kidney disease, stage 3 (moderate): Secondary | ICD-10-CM | POA: Diagnosis not present

## 2018-04-28 DIAGNOSIS — E1122 Type 2 diabetes mellitus with diabetic chronic kidney disease: Secondary | ICD-10-CM | POA: Diagnosis not present

## 2018-04-28 DIAGNOSIS — J449 Chronic obstructive pulmonary disease, unspecified: Secondary | ICD-10-CM | POA: Diagnosis not present

## 2018-04-28 DIAGNOSIS — Z79899 Other long term (current) drug therapy: Secondary | ICD-10-CM | POA: Diagnosis not present

## 2018-04-28 DIAGNOSIS — I2510 Atherosclerotic heart disease of native coronary artery without angina pectoris: Secondary | ICD-10-CM | POA: Insufficient documentation

## 2018-04-28 DIAGNOSIS — Z833 Family history of diabetes mellitus: Secondary | ICD-10-CM | POA: Diagnosis not present

## 2018-04-28 DIAGNOSIS — G20 Parkinson's disease: Secondary | ICD-10-CM | POA: Diagnosis not present

## 2018-04-28 DIAGNOSIS — H409 Unspecified glaucoma: Secondary | ICD-10-CM | POA: Insufficient documentation

## 2018-04-28 DIAGNOSIS — Z87891 Personal history of nicotine dependence: Secondary | ICD-10-CM | POA: Diagnosis not present

## 2018-04-28 DIAGNOSIS — I252 Old myocardial infarction: Secondary | ICD-10-CM | POA: Diagnosis not present

## 2018-04-28 HISTORY — PX: PORTA CATH INSERTION: CATH118285

## 2018-04-28 LAB — GLUCOSE, CAPILLARY
Glucose-Capillary: 125 mg/dL — ABNORMAL HIGH (ref 70–99)
Glucose-Capillary: 141 mg/dL — ABNORMAL HIGH (ref 70–99)

## 2018-04-28 SURGERY — PORTA CATH INSERTION
Anesthesia: Moderate Sedation

## 2018-04-28 MED ORDER — LIDOCAINE-EPINEPHRINE (PF) 1 %-1:200000 IJ SOLN
INTRAMUSCULAR | Status: AC
  Filled 2018-04-28: qty 10

## 2018-04-28 MED ORDER — CEFAZOLIN SODIUM-DEXTROSE 2-4 GM/100ML-% IV SOLN
INTRAVENOUS | Status: AC
  Filled 2018-04-28: qty 100

## 2018-04-28 MED ORDER — FAMOTIDINE 20 MG PO TABS: 40.0000 mg | ORAL_TABLET | ORAL | Status: DC | PRN

## 2018-04-28 MED ORDER — MIDAZOLAM HCL 5 MG/5ML IJ SOLN
INTRAMUSCULAR | Status: AC
  Filled 2018-04-28: qty 5

## 2018-04-28 MED ORDER — CEFAZOLIN SODIUM-DEXTROSE 2-4 GM/100ML-% IV SOLN
2.0000 g | Freq: Once | INTRAVENOUS | Status: AC
  Administered 2018-04-28: 2 g via INTRAVENOUS

## 2018-04-28 MED ORDER — SODIUM CHLORIDE 0.9 % IV SOLN
Freq: Once | INTRAVENOUS | Status: DC
  Filled 2018-04-28: qty 2

## 2018-04-28 MED ORDER — FENTANYL CITRATE (PF) 100 MCG/2ML IJ SOLN
INTRAMUSCULAR | Status: AC
  Filled 2018-04-28: qty 2

## 2018-04-28 MED ORDER — FENTANYL CITRATE (PF) 100 MCG/2ML IJ SOLN
INTRAMUSCULAR | Status: DC | PRN
  Administered 2018-04-28: 25 ug via INTRAVENOUS
  Administered 2018-04-28: 50 ug via INTRAVENOUS
  Administered 2018-04-28: 25 ug via INTRAVENOUS

## 2018-04-28 MED ORDER — HEPARIN (PORCINE) IN NACL 1000-0.9 UT/500ML-% IV SOLN
INTRAVENOUS | Status: AC
  Filled 2018-04-28: qty 500

## 2018-04-28 MED ORDER — METHYLPREDNISOLONE SODIUM SUCC 125 MG IJ SOLR: 125.0000 mg | INTRAMUSCULAR | Status: DC | PRN

## 2018-04-28 MED ORDER — MIDAZOLAM HCL 2 MG/2ML IJ SOLN
INTRAMUSCULAR | Status: DC | PRN
  Administered 2018-04-28 (×3): 1 mg via INTRAVENOUS

## 2018-04-28 MED ORDER — SODIUM CHLORIDE 0.9 % IV SOLN: INTRAVENOUS | Status: DC

## 2018-04-28 SURGICAL SUPPLY — 9 items
DERMABOND ADVANCED (GAUZE/BANDAGES/DRESSINGS) ×2
DERMABOND ADVANCED .7 DNX12 (GAUZE/BANDAGES/DRESSINGS) ×1 IMPLANT
DRAPE INCISE IOBAN 66X45 STRL (DRAPES) ×6 IMPLANT
KIT PORT POWER 8FR ISP CVUE (Port) ×3 IMPLANT
PACK ANGIOGRAPHY (CUSTOM PROCEDURE TRAY) ×3 IMPLANT
SUT MNCRL AB 4-0 PS2 18 (SUTURE) ×3 IMPLANT
SUT PROLENE 0 CT 1 30 (SUTURE) ×3 IMPLANT
SUT VIC AB 3-0 SH 27 (SUTURE) ×2
SUT VIC AB 3-0 SH 27X BRD (SUTURE) ×1 IMPLANT

## 2018-04-28 NOTE — Discharge Instructions (Signed)
Implanted Port Home Guide  An implanted port is a type of central line that is placed under the skin. Central lines are used to provide IV access when treatment or nutrition needs to be given through a person's veins. Implanted ports are used for long-term IV access. An implanted port may be placed because: You need IV medicine that would be irritating to the small veins in your hands or arms. You need long-term IV medicines, such as antibiotics. You need IV nutrition for a long period. You need frequent blood draws for lab tests. You need dialysis.   Implanted ports are usually placed in the chest area, but they can also be placed in the upper arm, the abdomen, or the leg. An implanted port has two main parts: Reservoir. The reservoir is round and will appear as a small, raised area under your skin. The reservoir is the part where a needle is inserted to give medicines or draw blood. Catheter. The catheter is a thin, flexible tube that extends from the reservoir. The catheter is placed into a large vein. Medicine that is inserted into the reservoir goes into the catheter and then into the vein.   How will I care for my incision  You may shower tomorrow  How is my port accessed? Special steps must be taken to access the port: Before the port is accessed, a numbing cream can be placed on the skin. This helps numb the skin over the port site. Your health care provider uses a sterile technique to access the port. Your health care provider must put on a mask and sterile gloves. The skin over your port is cleaned carefully with an antiseptic and allowed to dry. The port is gently pinched between sterile gloves, and a needle is inserted into the port. Only "non-coring" port needles should be used to access the port. Once the port is accessed, a blood return should be checked. This helps ensure that the port is in the vein and is not clogged. If your port needs to remain accessed for a constant  infusion, a clear (transparent) bandage will be placed over the needle site. The bandage and needle will need to be changed every week, or as directed by your health care provider.   What is flushing? Flushing helps keep the port from getting clogged. Follow your health care provider's instructions on how and when to flush the port. Ports are usually flushed with saline solution or a medicine called heparin. The need for flushing will depend on how the port is used. If the port is used for intermittent medicines or blood draws, the port will need to be flushed: After medicines have been given. After blood has been drawn. As part of routine maintenance. If a constant infusion is running, the port may not need to be flushed.   How long will my port stay implanted? The port can stay in for as long as your health care provider thinks it is needed. When it is time for the port to come out, surgery will be done to remove it. The procedure is similar to the one performed when the port was put in. When should I seek immediate medical care? When you have an implanted port, you should seek immediate medical care if: You notice a bad smell coming from the incision site. You have swelling, redness, or drainage at the incision site. You have more swelling or pain at the port site or the surrounding area. You have a fever that   is not controlled with medicine.   This information is not intended to replace advice given to you by your health care provider. Make sure you discuss any questions you have with your health care provider. Document Released: 05/05/2005 Document Revised: 10/11/2015 Document Reviewed: 01/10/2013 Elsevier Interactive Patient Education  2017 Elsevier Inc.    

## 2018-04-28 NOTE — Op Note (Signed)
OPERATIVE NOTE   PROCEDURE: 1. Placement of a right IJ Infuse-a-Port  PRE-OPERATIVE DIAGNOSIS: Hepatocellular carcinoma  POST-OPERATIVE DIAGNOSIS: Same  SURGEON: Katha Cabal M.D.  ANESTHESIA: Conscious sedation was administered under my direct supervision by the interventional radiology RN. IV Versed plus fentanyl were utilized. Continuous ECG, pulse oximetry and blood pressure was monitored throughout the entire procedure. Conscious sedation was for a total of 23 minutes.  ESTIMATED BLOOD LOSS: Minimal   FINDING(S): 1.  Patent vein  SPECIMEN(S): None  INDICATIONS:   Jimmy Hill. is a 77 y.o. male who presents with hepatocellular carcinoma.  He will require chemotherapy and therefore needs appropriate parenteral access.  Risks and benefits for Infuse-a-Port have been reviewed all questions been answered patient agrees to proceed.  DESCRIPTION: After obtaining full informed written consent, the patient was brought back to the special procedure suite and placed in the supine position. The patient's right neck and chest wall are prepped and draped in sterile fashion. Appropriate timeout was called.  Ultrasound is placed in a sterile sleeve, ultrasound is utilized to avoid vascular injury as well as secondary to lack of appropriate landmarks. The right internal jugular vein is identified. It is echolucent and homogeneous as well as easily compressible indicating patency. An image is recorded for the permanent record.  Access to the vein with a micropuncture needle is done under direct ultrasound visualization.  1% lidocaine is infiltrated into the soft tissue at the base of the neck as well as on the chest wall.  Under direct ultrasound visualization a micro-needle is inserted into the vein followed by the micro-wire. Micro-sheath was then advanced and a J wire is inserted without difficulty under fluoroscopic guidance. A small counterincision was created at the wire insertion  site. A transverse incision is created 2 fingerbreadths below the scapula and a pocket is fashioned using both blunt and sharp dissection. The pocket is tested for appropriate size with the hub of the Infuse-a-Port. The tunneling device is then used to pull the intravascular portion of the catheter from the pocket to the neck counterincision.  Dilator and peel-away sheath were then inserted over the wire and the wire is removed. Catheter is then advanced into the venous system without difficulty. Peel-away sheath was then removed.  Catheter is then positioned under fluoroscopic guidance at the atrial caval junction. It is then transected connected to the hub and the hope is slipped into the subcutaneous pocket on the chest wall. The hub was then accessed percutaneously and aspirates easily and flushes well and is flushed with 30 cc of heparinized saline. The pocket incision is then closed in layers using interrupted 3-0 Vicryl for the subcutaneous tissues and 4-0 Monocryl subcuticular for skin closure. Dermabond is applied. The neck counterincision was closed with 4-0 Monocryl subcuticular and Dermabond as well.  The patient tolerated the procedure well and there were no immediate complications.  COMPLICATIONS: None  CONDITION: Unchanged  Katha Cabal M.D. [place] Office: [phone]   04/28/2018, 2:42 PM

## 2018-04-28 NOTE — H&P (Signed)
[facility] History & Physical Update  The patient was interviewed and re-examined.  The patient's previous History and Physical has been reviewed and is unchanged.    The patient has hepatocellular carcinoma and is undergoing chemotherapy.  He therefore needs appropriate parenteral access.  Infuse-a-Port has been recommended.  The patient is in agreement with proceeding.  There is no change in the plan of care. We plan to proceed with the scheduled procedure.  Hortencia Pilar, MD  04/28/2018, 1:57 PM

## 2018-04-29 ENCOUNTER — Inpatient Hospital Stay: Payer: No Typology Code available for payment source | Admitting: Hematology and Oncology

## 2018-04-29 ENCOUNTER — Encounter: Payer: Self-pay | Admitting: Vascular Surgery

## 2018-04-30 ENCOUNTER — Inpatient Hospital Stay (HOSPITAL_BASED_OUTPATIENT_CLINIC_OR_DEPARTMENT_OTHER): Payer: Medicare (Managed Care) | Admitting: Hematology and Oncology

## 2018-04-30 ENCOUNTER — Encounter: Payer: Self-pay | Admitting: Hematology and Oncology

## 2018-04-30 ENCOUNTER — Inpatient Hospital Stay: Payer: Medicare (Managed Care)

## 2018-04-30 VITALS — BP 142/80 | HR 100 | Temp 97.6°F | Resp 20 | Wt 191.4 lb

## 2018-04-30 DIAGNOSIS — E860 Dehydration: Secondary | ICD-10-CM | POA: Insufficient documentation

## 2018-04-30 DIAGNOSIS — R34 Anuria and oliguria: Secondary | ICD-10-CM | POA: Diagnosis not present

## 2018-04-30 DIAGNOSIS — E119 Type 2 diabetes mellitus without complications: Secondary | ICD-10-CM

## 2018-04-30 DIAGNOSIS — C221 Intrahepatic bile duct carcinoma: Secondary | ICD-10-CM | POA: Diagnosis not present

## 2018-04-30 DIAGNOSIS — Z5111 Encounter for antineoplastic chemotherapy: Secondary | ICD-10-CM | POA: Diagnosis not present

## 2018-04-30 DIAGNOSIS — F329 Major depressive disorder, single episode, unspecified: Secondary | ICD-10-CM

## 2018-04-30 DIAGNOSIS — I252 Old myocardial infarction: Secondary | ICD-10-CM | POA: Diagnosis not present

## 2018-04-30 DIAGNOSIS — Z7189 Other specified counseling: Secondary | ICD-10-CM

## 2018-04-30 DIAGNOSIS — G20 Parkinson's disease: Secondary | ICD-10-CM

## 2018-04-30 DIAGNOSIS — C801 Malignant (primary) neoplasm, unspecified: Secondary | ICD-10-CM

## 2018-04-30 LAB — CBC WITH DIFFERENTIAL/PLATELET
Abs Immature Granulocytes: 0.03 10*3/uL (ref 0.00–0.07)
Basophils Absolute: 0.1 10*3/uL (ref 0.0–0.1)
Basophils Relative: 1 %
Eosinophils Absolute: 0.3 10*3/uL (ref 0.0–0.5)
Eosinophils Relative: 4 %
HCT: 42.2 % (ref 39.0–52.0)
Hemoglobin: 13.3 g/dL (ref 13.0–17.0)
Immature Granulocytes: 0 %
Lymphocytes Relative: 30 %
Lymphs Abs: 2.4 10*3/uL (ref 0.7–4.0)
MCH: 28.2 pg (ref 26.0–34.0)
MCHC: 31.5 g/dL (ref 30.0–36.0)
MCV: 89.4 fL (ref 80.0–100.0)
Monocytes Absolute: 1 10*3/uL (ref 0.1–1.0)
Monocytes Relative: 13 %
Neutro Abs: 4.3 10*3/uL (ref 1.7–7.7)
Neutrophils Relative %: 52 %
Platelets: 197 10*3/uL (ref 150–400)
RBC: 4.72 MIL/uL (ref 4.22–5.81)
RDW: 13.9 % (ref 11.5–15.5)
WBC: 8.2 10*3/uL (ref 4.0–10.5)
nRBC: 0 % (ref 0.0–0.2)

## 2018-04-30 LAB — COMPREHENSIVE METABOLIC PANEL
ALT: 10 U/L (ref 0–44)
AST: 22 U/L (ref 15–41)
Albumin: 3.3 g/dL — ABNORMAL LOW (ref 3.5–5.0)
Alkaline Phosphatase: 105 U/L (ref 38–126)
Anion gap: 7 (ref 5–15)
BUN: 18 mg/dL (ref 8–23)
CO2: 29 mmol/L (ref 22–32)
Calcium: 8.5 mg/dL — ABNORMAL LOW (ref 8.9–10.3)
Chloride: 103 mmol/L (ref 98–111)
Creatinine, Ser: 1.04 mg/dL (ref 0.61–1.24)
GFR calc Af Amer: >60 mL/min (ref >60)
GFR calc non Af Amer: >60 mL/min (ref >60)
Glucose, Bld: 143 mg/dL — ABNORMAL HIGH (ref 70–99)
Potassium: 4 mmol/L (ref 3.5–5.1)
Sodium: 139 mmol/L (ref 135–145)
Total Bilirubin: 0.6 mg/dL (ref 0.3–1.2)
Total Protein: 6.4 g/dL — ABNORMAL LOW (ref 6.5–8.1)

## 2018-04-30 LAB — MAGNESIUM: Magnesium: 1.7 mg/dL (ref 1.7–2.4)

## 2018-04-30 MED ORDER — SODIUM CHLORIDE 0.9 % IV SOLN
Freq: Once | INTRAVENOUS | Status: AC
  Administered 2018-04-30: 10:00:00 via INTRAVENOUS
  Filled 2018-04-30: qty 250

## 2018-04-30 MED ORDER — SODIUM CHLORIDE 0.9 % IV SOLN
Freq: Once | INTRAVENOUS | Status: AC
  Administered 2018-04-30: 14:00:00 via INTRAVENOUS
  Filled 2018-04-30: qty 250

## 2018-04-30 MED ORDER — SODIUM CHLORIDE 0.9% FLUSH
10.0000 mL | INTRAVENOUS | Status: DC | PRN
  Administered 2018-04-30: 10 mL via INTRAVENOUS
  Filled 2018-04-30: qty 10

## 2018-04-30 MED ORDER — POTASSIUM CHLORIDE 2 MEQ/ML IV SOLN
Freq: Once | INTRAVENOUS | Status: AC
  Administered 2018-04-30: 11:00:00 via INTRAVENOUS
  Filled 2018-04-30: qty 1000

## 2018-04-30 MED ORDER — SODIUM CHLORIDE 0.9 % IV SOLN
Freq: Once | INTRAVENOUS | Status: DC
  Filled 2018-04-30: qty 5

## 2018-04-30 MED ORDER — SODIUM CHLORIDE 0.9 % IV SOLN
25.0000 mg/m2 | Freq: Once | INTRAVENOUS | Status: DC
  Filled 2018-04-30: qty 52

## 2018-04-30 MED ORDER — HEPARIN SOD (PORK) LOCK FLUSH 100 UNIT/ML IV SOLN
500.0000 [IU] | Freq: Once | INTRAVENOUS | Status: AC
  Administered 2018-04-30: 500 [IU] via INTRAVENOUS
  Filled 2018-04-30: qty 5

## 2018-04-30 MED ORDER — SODIUM CHLORIDE 0.9 % IV SOLN
Freq: Once | INTRAVENOUS | Status: DC
  Filled 2018-04-30: qty 250

## 2018-04-30 MED ORDER — PALONOSETRON HCL INJECTION 0.25 MG/5ML: 0.2500 mg | Freq: Once | INTRAVENOUS | Status: DC

## 2018-04-30 MED ORDER — SODIUM CHLORIDE 0.9 % IV SOLN
2000.0000 mg | Freq: Once | INTRAVENOUS | Status: DC
  Filled 2018-04-30: qty 53

## 2018-04-30 NOTE — Progress Notes (Signed)
Pt in for follow up and chemo treatment today.

## 2018-04-30 NOTE — Progress Notes (Signed)
Orthostatic vital signs were obtain while patient was in infusion.   Lying - 121/82 HR 68, Sitting- 122/75 HR 67, Standing - 106/69 HR 75.  MD notified.

## 2018-04-30 NOTE — Progress Notes (Signed)
[facility] day:  04/30/2018   Chief Complaint: Jimmy Hill. is a 77 y.o. 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  HPI:  The patient was last seen in the medical oncology clinic on 03/18/2018.  At that time, his pain had improved.  He was using pain medications 2x/day.  Weight was stable.  At last visit, we discussed initiation of lenvatinib for presumed hepatocellular carcinoma.  CancerTYPE ID noted a 90% probability hepatocellular carcinoma.  QTc was prolonged.  Therapy was delayed and eventually cancelled secondary to persistent elevated QTc despite medication adjustment.  Dr. Leamon Arnt at [facility] was contacted.  He felt the Cancer ID type was discordant.  He felt the findings were more consistent with intrahepatic cholangiocarcinoma.  There was initial discussion regarding consultation with Dr. Leamon Arnt to discuss possible enrollment on clinical trial (gemcitabine, cisplatin, Tecentriq, and halozyme).  However, the clinical trial closed on 04/14/2018.  I discussed continuation of the second opinion with Dr. Leamon Arnt or proceeding with standard gemcitabine and cisplatin with his physician, Dr Meredith Staggers.  Decision was to proceed with chemotherapy.    Patient attended chemotherapy education class with Magdalene Patricia, RN on 04/27/2018. He was provided with written information regarding his proposed course of chemotherapy treatments. Patient was given the opportunity to have his preliminary clinical questions fielded by cancer center nurse navigator.   He underwent port-a-cath placement on 04/28/2018 by Dr. Delana Meyer.  During the interim, patient continues to have RIGHT upper chest wall pain. He denies nausea, vomiting, and changes to his bowel habits. He remains on supplemental oxygen at 2L/[place]; breathing is stable. Joint pain persists. Patient has stable neuropathy in his hands and feet. He notes that  his neuropathy does not impose any functional limitations, nor does it impede his ability to ambulate safely. Patient denies that he has experienced any B symptoms. He denies any interval infections.   Patient advises that he maintains an adequate appetite. He is eating well. Weight today is 191 lb 6 oz (86.8 kg), which compared to his last visit to the clinic, represents a 3 pound decrease.     Patient complains of pain rated 8/10 in the clinic today.   Past Medical History:  Diagnosis Date  . Allergic rhinitis   . Anginal pain ([place])   . Arthritis   . Autonomic dysreflexia   . Back pain    CHRONIC LBP,STENOSIS  . CHF (congestive heart failure) ([place])   . Chronic kidney disease    STAGE 3  . COPD (chronic obstructive pulmonary disease) ([place])   . Coronary artery disease   . Depression   . Diabetes ([place])   . Diabetes mellitus without complication ([place])   . DJD (degenerative joint disease)   . Dysphagia   . Dysrhythmia    A-fib, now converted  . Edema    FEET/ANKLES  . GERD (gastroesophageal reflux disease)   . Glaucoma   . H/O orthostatic hypotension   . Hypercholesterolemia   . Myocardial infarction ([place]) 2000  . Parkinson's disease ([place])   . Peripheral vascular disease ([place])   . Pneumonia    history of   . Polyneuropathy   . RLS (restless legs syndrome)   . Seizures ([place])    when in rehab for his hip  . Sleep apnea   . Smoker   . Spinal stenosis   . Strabismic amblyopia   . Tinnitus     Past  Surgical History:  Procedure Laterality Date  . ANKLE SURGERY    . APPENDECTOMY    . BACK SURGERY    . CATARACT EXTRACTION W/PHACO Left 08/26/2016   Procedure: CATARACT EXTRACTION PHACO AND INTRAOCULAR LENS PLACEMENT (IOC);  Surgeon: Birder Robson, MD;  Location: [facility] ORS;  Service: Ophthalmology;  Laterality: Left;  [place] 01:37 AP% 19.4 CDE 18.97 fluid pack lot # [id number]  . CORONARY ANGIOPLASTY     STENT  . CTR    . ESOPHAGOGASTRODUODENOSCOPY (EGD) WITH PROPOFOL N/A  03/04/2016   Procedure: ESOPHAGOGASTRODUODENOSCOPY (EGD) WITH PROPOFOL;  Surgeon: [name], MD;  Location: [facility] ENDOSCOPY;  Service: Endoscopy;  Laterality: N/A;  . EYE SURGERY     cataract  . FRACTURE SURGERY    . HIP PINNING,CANNULATED Left 12/04/2015   Procedure: CANNULATED HIP PINNING;  Surgeon: Thornton Park, MD;  Location: [facility] ORS;  Service: Orthopedics;  Laterality: Left;  . PORTA CATH INSERTION N/A 04/28/2018   Procedure: PORTA CATH INSERTION;  Surgeon: Katha Cabal, MD;  Location: [facility];  Service: Cardiovascular;  Laterality: N/A;    Family History  Problem Relation Age of Onset  . Heart disease Father   . Diabetes Mellitus II Other     Social History:  reports that he quit smoking about 2 years ago. His smoking use included cigarettes. He smoked 0.50 packs per day. He has never used smokeless tobacco. He reports that he does not drink alcohol or use drugs.  He smoked 2 packs/week x 60 years.  He is unaware of any exposures to radiation or toxins.  He is a retired Production designer, theatre/television/film.  He lives alone.  He has a wheelchair at his house (Parkinson's, diabetes with neuropathy).  He is able to perform is ADLs.  He has aides come out during the day.  His wife has dementia and lives in the [facility].  Daughter is [name]. The patient is alone today.   Allergies:  Allergies  Allergen Reactions  . Bupropion Other (See Comments)    Seizure  . Bactrim [Sulfamethoxazole-Trimethoprim] Other (See Comments)  . Cheese     Patient doesn't want any cheese    Current Medications: Current Outpatient Medications  Medication Sig Dispense Refill  . acetaminophen (TYLENOL) 325 MG tablet Take 650 mg by mouth every 6 (six) hours as needed.    Marland Kitchen albuterol (PROVENTIL HFA;VENTOLIN HFA) 108 (90 Base) MCG/ACT inhaler Inhale into the lungs every 6 (six) hours as needed for wheezing or shortness of breath.    Marland Kitchen albuterol (PROVENTIL) (2.5 MG/3ML) 0.083% nebulizer  solution Take 2.5 mg by nebulization 4 (four) times daily as needed for wheezing or shortness of breath.    Marland Kitchen amiodarone (PACERONE) 100 MG tablet Take 100 mg by mouth daily.    Marland Kitchen atorvastatin (LIPITOR) 80 MG tablet Take 80 mg by mouth at bedtime.    . brimonidine-timolol (COMBIGAN) 0.2-0.5 % ophthalmic solution Place 1 drop into both eyes 2 (two) times daily.    . Calcium Carbonate-Vitamin D (CALCIUM 600+D PO) Take 1 tablet by mouth daily.    . carbidopa-levodopa (SINEMET IR) 25-250 MG tablet Take 2 tablets by mouth every morning (8AM), 1 tablet at noon, 1 tablet every afternoon (430PM) and 1 tablet at bedtime (10PM)    . Carboxymethylcellul-Glycerin (REFRESH OPTIVE OP) Apply 2 drops to eye every 8 (eight) hours as needed (dry eyes).    Marland Kitchen dexamethasone (DECADRON) 4 MG tablet Take 2 tablets by mouth once a day on the  day after chemotherapy and then take 1 tablet two times a day for 2 days. Take with food. 30 tablet 1  . dextrose (GLUTOSE 15) 40 % GEL Take 1 Tube by mouth. For blood sugar less than 70, repeat in 5 minutes up to 2 doses     . ferrous sulfate 325 (65 FE) MG tablet Take 325 mg by mouth daily with breakfast.    . FLUDROCORTISONE ACETATE PO Take 0.1 mg by mouth daily.    Marland Kitchen ibuprofen (ADVIL,MOTRIN) 200 MG tablet Take 400 mg by mouth every 6 (six) hours as needed.    . insulin glargine (LANTUS) 100 UNIT/ML injection Inject 20 Units into the skin every morning.     . isosorbide mononitrate (ISMO,MONOKET) 10 MG tablet Take 5 mg by mouth at bedtime.     . Lenvatinib 12 mg daily dose (LENVIMA) 3 x 4 MG capsule Take 12 mg by mouth daily. 90 capsule 0  . levETIRAcetam (KEPPRA) 500 MG tablet Take 500 mg by mouth 2 (two) times daily.    Marland Kitchen lidocaine (LIDODERM) 5 % Place 1 patch onto the skin daily. Remove & Discard patch within 12 hours or as directed by MD    . lidocaine-prilocaine (EMLA) cream Apply to affected area once 30 g 3  . Oxycodone HCl 10 MG TABS Take 10 mg by mouth 3 (three) times daily  as needed.     . pantoprazole (PROTONIX) 40 MG tablet Take 40 mg by mouth daily.    Marland Kitchen Dextromethorphan-Guaifenesin (TUSSIN DM) 10-100 MG/5ML liquid Take 5 mLs by mouth every 6 (six) hours as needed (cough).     Marland Kitchen loperamide (IMODIUM) 2 MG capsule Take by mouth as needed for diarrhea or loose stools.    . magnesium oxide (MAG-OX) 400 MG tablet Take 400 mg by mouth daily.    . metFORMIN (GLUCOPHAGE) 1000 MG tablet Take 1,000 mg by mouth 2 (two) times daily with a meal.     . metoprolol succinate (TOPROL-XL) 25 MG 24 hr tablet Take 25 mg by mouth daily.    . mirabegron ER (MYRBETRIQ) 50 MG TB24 tablet Take 50 mg by mouth daily.    . mometasone-formoterol (DULERA) 100-5 MCG/ACT AERO Inhale 2 puffs into the lungs 2 (two) times daily.    . naloxone (NARCAN) nasal spray 4 mg/0.1 mL Place 1 spray into the nose.    . nitroGLYCERIN (NITROSTAT) 0.4 MG SL tablet Place 0.4 mg under the tongue every 5 (five) minutes as needed for chest pain.    Marland Kitchen ondansetron (ZOFRAN) 8 MG tablet Take 1 tablet (8 mg total) by mouth 2 (two) times daily as needed. Start on the third day after chemotherapy. 30 tablet 1  . polyethylene glycol (MIRALAX / GLYCOLAX) packet Take 17 g by mouth daily.    . potassium chloride (K-DUR,KLOR-CON) 10 MEQ tablet Take 20 mEq by mouth daily.    . predniSONE (DELTASONE) 10 MG tablet Take 5 tablets (50 mg total) by mouth daily with breakfast. Take 50 mg daily taper by 10 mg daily then stop (Patient not taking: Reported on 04/28/2018) 15 tablet 0  . pregabalin (LYRICA) 50 MG capsule Take 100 mg by mouth every evening.     . prochlorperazine (COMPAZINE) 10 MG tablet Take 1 tablet (10 mg total) by mouth every 6 (six) hours as needed (Nausea or vomiting). 30 tablet 1  . senna-docusate (SENOKOT-S) 8.6-50 MG tablet Take 2 tablets by mouth at bedtime as needed.     . traMADol (  ULTRAM) 50 MG tablet Take 50 mg by mouth every 6 (six) hours as needed for moderate pain.    . vitamin B-12 (CYANOCOBALAMIN) 1000  MCG tablet Take 1,000 mcg by mouth daily.    . Vitamins A & D (VITAMIN A & D) ointment Apply 1 application topically as needed for dry skin.     No current facility-administered medications for this visit.    Facility-Administered Medications Ordered in Other Visits  Medication Dose Route Frequency Provider Last Rate Last Dose  . heparin lock flush 100 unit/mL  500 Units Intravenous Once Corcoran, Melissa C, MD      . sodium chloride flush (NS) 0.9 % injection 10 mL  10 mL Intravenous PRN Lequita Asal, MD   10 mL at 04/30/18 0845    Review of Systems  Constitutional: Negative for diaphoresis, fever, malaise/fatigue and weight loss (stable).       "I'm here".  HENT: Negative.  Negative for congestion, ear discharge, ear pain, nosebleeds, sinus pain and sore throat.   Eyes: Negative.        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   Respiratory: Positive for cough (chronic) and shortness of breath (exertional). Negative for hemoptysis and sputum production.        Require supplemental oxygen ATC.  Cardiovascular: Negative for chest pain, palpitations, orthopnea, leg swelling and PND.       H/o prolonged QTc.  Gastrointestinal: Negative.  Negative for abdominal pain, blood in stool, constipation, diarrhea, melena, nausea and vomiting.       Right sided pain (upper abdomen/lower chest).  Genitourinary: Negative.  Negative for dysuria, frequency, hematuria and urgency.  Musculoskeletal: Positive for joint pain. Negative for back pain, falls and myalgias.  Skin: Negative.  Negative for itching and rash.  Neurological: Positive for sensory change (neuropathy in hands and feet). Negative for dizziness, tremors, focal weakness, weakness and headaches.  Endo/Heme/Allergies: Does not bruise/bleed easily.       Diabetes  Psychiatric/Behavioral: Negative for depression and memory loss. The patient is not nervous/anxious and does not have insomnia.   All other systems  reviewed and are negative.  Performance status (ECOG): 2 - Symptomatic, <50% confined to bed  Vital Signs: BP (!) 142/80 (BP Location: Left Arm, Patient Position: Sitting)   Pulse 100   Temp 97.6 F (36.4 C) (Tympanic)   Resp 20   Wt 191 lb 6 oz (86.8 kg)   BMI 29.10 kg/m   Physical Exam  Constitutional: He is oriented to person, place, and time. No distress.  Chronically fatigued appearing gentleman sitting comfortably in a wheelchair in no acute distress.  HENT:  Head: Normocephalic and atraumatic.  Mouth/Throat: Oropharynx is clear and moist and mucous membranes are normal.  Gray hair.  Eyes: Pupils are equal, round, and reactive to light. Conjunctivae and EOM are normal. No scleral icterus.  Glasses.  Blue eyes.  Neck: Normal range of motion. Neck supple. No JVD present.  Cardiovascular: Normal rate, regular rhythm, normal heart sounds and intact distal pulses. Exam reveals no gallop and no friction rub.  No murmur heard. Pulmonary/Chest: Effort normal and breath sounds normal. No respiratory distress. He has no wheezes. He has no rales.  Supplemental oxygen in place via [place]  Abdominal: Soft. Bowel sounds are normal. He exhibits no distension and no mass. There is no abdominal tenderness. There is no rebound and no guarding.  Liver edge palpable.  Musculoskeletal: Normal range of motion.  General: No tenderness or edema.  Lymphadenopathy:    He has no cervical adenopathy.  Neurological: He is alert and oriented to person, place, and time.  Skin: Skin is warm and dry. No rash noted. He is not diaphoretic. No erythema.  Psychiatric: Mood, affect and judgment normal.  Nursing note and vitals reviewed.   Appointment on 04/30/2018  Component Date Value Ref Range Status  . Sodium 04/30/2018 139  135 - 145 mmol/L Final  . Potassium 04/30/2018 4.0  3.5 - 5.1 mmol/L Final  . Chloride 04/30/2018 103  98 - 111 mmol/L Final  . CO2 04/30/2018 29  22 - 32 mmol/L Final  .  Glucose, Bld 04/30/2018 143* 70 - 99 mg/dL Final  . BUN 04/30/2018 18  8 - 23 mg/dL Final  . Creatinine, Ser 04/30/2018 1.04  0.61 - 1.24 mg/dL Final  . Calcium 04/30/2018 8.5* 8.9 - 10.3 mg/dL Final  . Total Protein 04/30/2018 6.4* 6.5 - 8.1 g/dL Final  . Albumin 04/30/2018 3.3* 3.5 - 5.0 g/dL Final  . AST 04/30/2018 22  15 - 41 U/L Final  . ALT 04/30/2018 10  0 - 44 U/L Final  . Alkaline Phosphatase 04/30/2018 105  38 - 126 U/L Final  . Total Bilirubin 04/30/2018 0.6  0.3 - 1.2 mg/dL Final  . GFR calc non Af Amer 04/30/2018 >60  >60 mL/min Final  . GFR calc Af Amer 04/30/2018 >60  >60 mL/min Final  . Anion gap 04/30/2018 7  5 - 15 Final   Performed at [facility], [address], [place], [place] [address]  . WBC 04/30/2018 8.2  4.0 - 10.5 K/uL Final  . RBC 04/30/2018 4.72  4.22 - 5.81 MIL/uL Final  . Hemoglobin 04/30/2018 13.3  13.0 - 17.0 g/dL Final  . HCT 04/30/2018 42.2  39.0 - 52.0 % Final  . MCV 04/30/2018 89.4  80.0 - 100.0 fL Final  . MCH 04/30/2018 28.2  26.0 - 34.0 pg Final  . MCHC 04/30/2018 31.5  30.0 - 36.0 g/dL Final  . RDW 04/30/2018 13.9  11.5 - 15.5 % Final  . Platelets 04/30/2018 197  150 - 400 K/uL Final  . nRBC 04/30/2018 0.0  0.0 - 0.2 % Final  . Neutrophils Relative % 04/30/2018 52  % Final  . Neutro Abs 04/30/2018 4.3  1.7 - 7.7 K/uL Final  . Lymphocytes Relative 04/30/2018 30  % Final  . Lymphs Abs 04/30/2018 2.4  0.7 - 4.0 K/uL Final  . Monocytes Relative 04/30/2018 13  % Final  . Monocytes Absolute 04/30/2018 1.0  0.1 - 1.0 K/uL Final  . Eosinophils Relative 04/30/2018 4  % Final  . Eosinophils Absolute 04/30/2018 0.3  0.0 - 0.5 K/uL Final  . Basophils Relative 04/30/2018 1  % Final  . Basophils Absolute 04/30/2018 0.1  0.0 - 0.1 K/uL Final  . Immature Granulocytes 04/30/2018 0  % Final  . Abs Immature Granulocytes 04/30/2018 0.03  0.00 - 0.07 K/uL Final   Performed at [facility], [address], [place], [place] [address]  . Magnesium  04/30/2018 1.7  1.7 - 2.4 mg/dL Final   Performed at [facility], [address] [place], [place] [address]  Admission on 04/28/2018, Discharged on 04/28/2018  Component Date Value Ref Range Status  . Glucose-Capillary 04/28/2018 125* 70 - 99 mg/dL Final  . Glucose-Capillary 04/28/2018 141* 70 - 99 mg/dL Final    Assessment:  Corky Blumstein. is a 77 y.o. male  with intrahepatic cholangiocarcinoma s/p ultrasound guided biopsy on 02/09/2018.  Pathology revealed adenocarcinoma.  Cytokeratin 7 (+), cytokeratin 20 (+), HepPar-1 (-), CD10 (-).  Differential diagnosis based on IHC staining includes primary intrahepatic cholangiocarcinoma and metastatic adenocarcinoma from gastric or pancreaticobiliary primary.   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  Pancreatobiliary 5% (cholangiocacinoma, gallbladder adenocarcinoma, pancreatic adenocarcinoma) cannot be excluded.  CA19-9 was 253 (0-35).  CEA was 8.3.  AFP was 13.4.  Normal studies included:  hepatitis B core antibody total, hepatitis C antibody.  MRI abdomen without contrast on 01/18/2018 revealed significant enlargement of a lateral segment left liver lobe lesion (4.1 x 3.2 cm). There was new adjacent 3.6 x 3.3 cm gastrohepatic ligament adenopathy.  Differential considerations include hepatocellular carcinoma or metastatic disease from an unknown primary. There were smaller bilateral liver lesions which are also suspicious for hepatocellular carcinomas or metastatic disease.  PET scan on 01/28/2018 revealed hypermetabolism associated with a large ill-defined left hepatic lobe mass (SUV 7.0) as well as periportal/gastrohepatic ligament adenopathy (up to 1.5 cm: SUV 4.3).  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  There was aortic atherosclerosis, three-vessel coronary artery calcification and  bilateral renal stones.  He has iron deficiency anemia.  Labs on 01/22/2018 revealed a ferritin 12 and an iron saturation of 9%. B12 and folate were normal.  Labs on 11/26/2017 revealed a normal creatinine and TSH.  Diet appears good.   EGD at [facility] on 04/17/2017 revealed diffuse atrophic mucosa in the entire stomach and multiple localized erosions without bleeding.  Pathology revealed foveolar hyperplasia c/w reactive gastropathy.  Colonoscopy at [facility] on 04/17/2017 revealed two 5-7 mm polyps in the descending colon and transverse colon (adenomatous polyps).  Capsule study on 05/05/2017 revealed polyps x 2 in the proximal small bowel and AVMs x 2 in the terminal ileum and proximal small bowel.  Plan was anterograde and retrograde balloon enteroscopy at [facility].  Code status is DNI.  Symptomatically, he notes chronic right lower chest/upper quadrant pain.  Exam is stable.  Plan: 1. Labs today:  CBC with diff, CMP, Mg. 2. Intrahepatic cholangiocarcinoma:  Review diagnosis of cholangiocarcinoma.  Treatment is palliative.  Discuss gemcitabine alone versus gemcitabine + cisplatin.  Discuss multicenter ABC-02 trial (410 patients) comparing cisplatin + gemcitabine vs gemcitabine alone.   Median overall survival was 11.7 vs 8.1 months, with a progression-free survival of 8 vs 5 months.    Increase toxicity with gemcitabine + cisplatin.  Side effects reviewed.  Increased neutropenia and nausea with combination therapy.   Gemcitabine alone 1000 mg/m2 day 1,8,15 every 28 days  OR   Gemcitabine 1000 mg/m2 + cisplatin 25 mg/m2 day 1 and 8 every 21 days.  Discuss potential side effects with treatment.  Patient consented to treatment.  Review use of anti-emetics.  Discuss potenital side effects associated with Decadron.  Day 1 of cycle #1 gemcitabine + cisplatin today. 3. Iron deficiency anemia: Hemoglobin and MCV are normal. Continue oral iron daily with a source of vitamin C. Goal ferritin is 100. 4. Pain  symptom management:  Pain is fairly well controlled.  Continue to monitor. 5. RTC on 05/07/2018 for MD assessment, labs (CBC with differential, CMP), and day 8 of cycle #1 cisplatin and gemcitabine. 6.   RTC on 05/14/2018 for labs (CBC with diff, BMP, Mg) +/- IVF and IV Mg. 7.   RTC on 05/21/2018 for MD assessment,  labs (CBC with diff, CMP, Mg), and day 1 of cycle #2 cisplatin + gemcitabine. 8.   RTC on 05/28/2018 for MD assessment, labs (CBC with diff, CMP, Mg), and day 8 of cycle #2 cisplatin + gemcitabine.  Addendum:  Patient did not receive chemotherapy today.  Despite 1.5 liters of IVF, patient voided very little and thus was unable to receive cisplatin safely.  Bladder ultrasound at the end of the day revealed minimal urine.  Patient notes that he only voids 2 times/day (morning and night).  Suspect patient was dry.   Honor Loh, NP  04/30/2018, 9:32 AM   I saw and evaluated the patient, participating in the key portions of the service and reviewing pertinent diagnostic studies and records.  I reviewed the nurse practitioner's note and agree with the findings and the plan.  The assessment and plan were discussed with the patient.  Multiple questions were asked by the patient and answered.   Nolon Stalls, MD 04/30/2018,9:32 AM

## 2018-05-03 ENCOUNTER — Inpatient Hospital Stay: Payer: Medicare (Managed Care)

## 2018-05-03 ENCOUNTER — Inpatient Hospital Stay: Payer: Medicare (Managed Care) | Admitting: Hematology and Oncology

## 2018-05-03 NOTE — Progress Notes (Signed)
Nutrition  Patient was scheduled for nutrition appointment today during infusion.  All other appointments were moved except for nutrition. Patient did not come for appointment.  Message sent to scheduling to reschedule nutrition appointment.  Antonique Langford B. Zenia Resides, [place], [place] Registered Dietitian [phone] (pager)

## 2018-05-03 NOTE — Progress Notes (Deleted)
[facility] day:  05/03/2018   Chief Complaint: Jimmy Hill. is a 77 y.o. 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  HPI:  The patient was last seen in the medical oncology clinic on 04/30/2018.  At that time, he noted chronic right lower chest/upper quadrant pain.  Exam was stable.  BUN was 18.  Creatinine was 1.04.  Plan was to initiate cisplatin and gemcitabine.  Despite hydration (1.5 liters), patient voided minimally.  Bladder ultrasound revealed a small volume of urine.  Chemotherapy was held.  During the interim,    Past Medical History:  Diagnosis Date  . Allergic rhinitis   . Anginal pain ([place])   . Arthritis   . Autonomic dysreflexia   . Back pain    CHRONIC LBP,STENOSIS  . CHF (congestive heart failure) ([place])   . Chronic kidney disease    STAGE 3  . COPD (chronic obstructive pulmonary disease) ([place])   . Coronary artery disease   . Depression   . Diabetes ([place])   . Diabetes mellitus without complication ([place])   . DJD (degenerative joint disease)   . Dysphagia   . Dysrhythmia    A-fib, now converted  . Edema    FEET/ANKLES  . GERD (gastroesophageal reflux disease)   . Glaucoma   . H/O orthostatic hypotension   . Hypercholesterolemia   . Myocardial infarction ([place]) 2000  . Parkinson's disease ([place])   . Peripheral vascular disease ([place])   . Pneumonia    history of   . Polyneuropathy   . RLS (restless legs syndrome)   . Seizures ([place])    when in rehab for his hip  . Sleep apnea   . Smoker   . Spinal stenosis   . Strabismic amblyopia   . Tinnitus     Past Surgical History:  Procedure Laterality Date  . ANKLE SURGERY    . APPENDECTOMY    . BACK SURGERY    . CATARACT EXTRACTION W/PHACO Left 08/26/2016   Procedure: CATARACT EXTRACTION PHACO AND INTRAOCULAR LENS PLACEMENT (IOC);  Surgeon: Birder Robson, MD;  Location: [facility] ORS;  Service:  Ophthalmology;  Laterality: Left;  [place] 01:37 AP% 19.4 CDE 18.97 fluid pack lot # [id number]  . CORONARY ANGIOPLASTY     STENT  . CTR    . ESOPHAGOGASTRODUODENOSCOPY (EGD) WITH PROPOFOL N/A 03/04/2016   Procedure: ESOPHAGOGASTRODUODENOSCOPY (EGD) WITH PROPOFOL;  Surgeon: [name], MD;  Location: [facility] ENDOSCOPY;  Service: Endoscopy;  Laterality: N/A;  . EYE SURGERY     cataract  . FRACTURE SURGERY    . HIP PINNING,CANNULATED Left 12/04/2015   Procedure: CANNULATED HIP PINNING;  Surgeon: Thornton Park, MD;  Location: [facility] ORS;  Service: Orthopedics;  Laterality: Left;  . PORTA CATH INSERTION N/A 04/28/2018   Procedure: PORTA CATH INSERTION;  Surgeon: Katha Cabal, MD;  Location: [facility];  Service: Cardiovascular;  Laterality: N/A;    Family History  Problem Relation Age of Onset  . Heart disease Father   . Diabetes Mellitus II Other     Social History:  reports that he quit smoking about 2 years ago. His smoking use included cigarettes. He smoked 0.50 packs per day. He has never used smokeless tobacco. He reports that he does not drink alcohol or use drugs.  He smoked 2 packs/week x 60 years.  He is unaware of any exposures to radiation or toxins.  He is  a retired Production designer, theatre/television/film.  He lives alone.  He has a wheelchair at his house (Parkinson's, diabetes with neuropathy).  He is able to perform is ADLs.  He has aides come out during the day.  His wife has dementia and lives in the [facility].  Daughter is [name]. The patient is alone today.   Allergies:  Allergies  Allergen Reactions  . Bupropion Other (See Comments)    Seizure  . Bactrim [Sulfamethoxazole-Trimethoprim] Other (See Comments)  . Cheese     Patient doesn't want any cheese    Current Medications: Current Outpatient Medications  Medication Sig Dispense Refill  . acetaminophen (TYLENOL) 325 MG tablet Take 650 mg by mouth every 6 (six) hours as needed.    Marland Kitchen albuterol (PROVENTIL  HFA;VENTOLIN HFA) 108 (90 Base) MCG/ACT inhaler Inhale into the lungs every 6 (six) hours as needed for wheezing or shortness of breath.    Marland Kitchen albuterol (PROVENTIL) (2.5 MG/3ML) 0.083% nebulizer solution Take 2.5 mg by nebulization 4 (four) times daily as needed for wheezing or shortness of breath.    Marland Kitchen amiodarone (PACERONE) 100 MG tablet Take 100 mg by mouth daily.    Marland Kitchen atorvastatin (LIPITOR) 80 MG tablet Take 80 mg by mouth at bedtime.    . brimonidine-timolol (COMBIGAN) 0.2-0.5 % ophthalmic solution Place 1 drop into both eyes 2 (two) times daily.    . Calcium Carbonate-Vitamin D (CALCIUM 600+D PO) Take 1 tablet by mouth daily.    . carbidopa-levodopa (SINEMET IR) 25-250 MG tablet Take 2 tablets by mouth every morning (8AM), 1 tablet at noon, 1 tablet every afternoon (430PM) and 1 tablet at bedtime (10PM)    . Carboxymethylcellul-Glycerin (REFRESH OPTIVE OP) Apply 2 drops to eye every 8 (eight) hours as needed (dry eyes).    Marland Kitchen dexamethasone (DECADRON) 4 MG tablet Take 2 tablets by mouth once a day on the day after chemotherapy and then take 1 tablet two times a day for 2 days. Take with food. 30 tablet 1  . Dextromethorphan-Guaifenesin (TUSSIN DM) 10-100 MG/5ML liquid Take 5 mLs by mouth every 6 (six) hours as needed (cough).     Marland Kitchen dextrose (GLUTOSE 15) 40 % GEL Take 1 Tube by mouth. For blood sugar less than 70, repeat in 5 minutes up to 2 doses     . ferrous sulfate 325 (65 FE) MG tablet Take 325 mg by mouth daily with breakfast.    . FLUDROCORTISONE ACETATE PO Take 0.1 mg by mouth daily.    Marland Kitchen ibuprofen (ADVIL,MOTRIN) 200 MG tablet Take 400 mg by mouth every 6 (six) hours as needed.    . insulin glargine (LANTUS) 100 UNIT/ML injection Inject 20 Units into the skin every morning.     . isosorbide mononitrate (ISMO,MONOKET) 10 MG tablet Take 5 mg by mouth at bedtime.     . Lenvatinib 12 mg daily dose (LENVIMA) 3 x 4 MG capsule Take 12 mg by mouth daily. 90 capsule 0  . levETIRAcetam (KEPPRA) 500  MG tablet Take 500 mg by mouth 2 (two) times daily.    Marland Kitchen lidocaine (LIDODERM) 5 % Place 1 patch onto the skin daily. Remove & Discard patch within 12 hours or as directed by MD    . lidocaine-prilocaine (EMLA) cream Apply to affected area once 30 g 3  . loperamide (IMODIUM) 2 MG capsule Take by mouth as needed for diarrhea or loose stools.    . magnesium oxide (MAG-OX) 400 MG tablet Take 400 mg by  mouth daily.    . metFORMIN (GLUCOPHAGE) 1000 MG tablet Take 1,000 mg by mouth 2 (two) times daily with a meal.     . metoprolol succinate (TOPROL-XL) 25 MG 24 hr tablet Take 25 mg by mouth daily.    . mirabegron ER (MYRBETRIQ) 50 MG TB24 tablet Take 50 mg by mouth daily.    . mometasone-formoterol (DULERA) 100-5 MCG/ACT AERO Inhale 2 puffs into the lungs 2 (two) times daily.    . naloxone (NARCAN) nasal spray 4 mg/0.1 mL Place 1 spray into the nose.    . nitroGLYCERIN (NITROSTAT) 0.4 MG SL tablet Place 0.4 mg under the tongue every 5 (five) minutes as needed for chest pain.    Marland Kitchen ondansetron (ZOFRAN) 8 MG tablet Take 1 tablet (8 mg total) by mouth 2 (two) times daily as needed. Start on the third day after chemotherapy. 30 tablet 1  . Oxycodone HCl 10 MG TABS Take 10 mg by mouth 3 (three) times daily as needed.     . pantoprazole (PROTONIX) 40 MG tablet Take 40 mg by mouth daily.    . polyethylene glycol (MIRALAX / GLYCOLAX) packet Take 17 g by mouth daily.    . potassium chloride (K-DUR,KLOR-CON) 10 MEQ tablet Take 40 mEq by mouth daily.     . pregabalin (LYRICA) 50 MG capsule Take 100 mg by mouth every evening.     . prochlorperazine (COMPAZINE) 10 MG tablet Take 1 tablet (10 mg total) by mouth every 6 (six) hours as needed (Nausea or vomiting). (Patient not taking: Reported on 04/30/2018) 30 tablet 1  . senna-docusate (SENOKOT-S) 8.6-50 MG tablet Take 2 tablets by mouth at bedtime as needed.     . traMADol (ULTRAM) 50 MG tablet Take 50 mg by mouth every 6 (six) hours as needed for moderate pain.    .  vitamin B-12 (CYANOCOBALAMIN) 1000 MCG tablet Take 1,000 mcg by mouth daily.    . Vitamins A & D (VITAMIN A & D) ointment Apply 1 application topically as needed for dry skin.     No current facility-administered medications for this visit.     Review of Systems  Constitutional: Negative for diaphoresis, fever, malaise/fatigue and weight loss (stable).       "I'm here".  HENT: Negative.  Negative for congestion, ear discharge, ear pain, nosebleeds, sinus pain and sore throat.   Eyes: Negative.        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   Respiratory: Positive for cough (chronic) and shortness of breath (exertional). Negative for hemoptysis and sputum production.        Require supplemental oxygen ATC.  Cardiovascular: Negative for chest pain, palpitations, orthopnea, leg swelling and PND.       H/o prolonged QTc.  Gastrointestinal: Negative.  Negative for abdominal pain, blood in stool, constipation, diarrhea, melena, nausea and vomiting.       Right sided pain (upper abdomen/lower chest).  Genitourinary: Negative.  Negative for dysuria, frequency, hematuria and urgency.  Musculoskeletal: Positive for joint pain. Negative for back pain, falls and myalgias.  Skin: Negative.  Negative for itching and rash.  Neurological: Positive for sensory change (neuropathy in hands and feet). Negative for dizziness, tremors, focal weakness, weakness and headaches.  Endo/Heme/Allergies: Does not bruise/bleed easily.       Diabetes  Psychiatric/Behavioral: Negative for depression and memory loss. The patient is not nervous/anxious and does not have insomnia.   All other systems reviewed and are  negative.  Performance status (ECOG): 2 - Symptomatic, <50% confined to bed  Vital Signs: There were no vitals taken for this visit.  Physical Exam  Constitutional: He is oriented to person, place, and time. No distress.  Chronically fatigued appearing gentleman sitting  comfortably in a wheelchair in no acute distress.  HENT:  Head: Normocephalic and atraumatic.  Mouth/Throat: Oropharynx is clear and moist and mucous membranes are normal.  Gray hair.  Eyes: Pupils are equal, round, and reactive to light. Conjunctivae and EOM are normal. No scleral icterus.  Glasses.  Blue eyes.  Neck: Normal range of motion. Neck supple. No JVD present.  Cardiovascular: Normal rate, regular rhythm, normal heart sounds and intact distal pulses. Exam reveals no gallop and no friction rub.  No murmur heard. Pulmonary/Chest: Effort normal and breath sounds normal. No respiratory distress. He has no wheezes. He has no rales.  Supplemental oxygen in place via [place]  Abdominal: Soft. Bowel sounds are normal. He exhibits no distension and no mass. There is no abdominal tenderness. There is no rebound and no guarding.  Liver edge palpable.  Musculoskeletal: Normal range of motion.        General: No tenderness or edema.  Lymphadenopathy:    He has no cervical adenopathy.  Neurological: He is alert and oriented to person, place, and time.  Skin: Skin is warm and dry. No rash noted. He is not diaphoretic. No erythema.  Psychiatric: Mood, affect and judgment normal.  Nursing note and vitals reviewed.   No visits with results within 3 Day(s) from this visit.  Latest known visit with results is:  Appointment on 04/30/2018  Component Date Value Ref Range Status  . Sodium 04/30/2018 139  135 - 145 mmol/L Final  . Potassium 04/30/2018 4.0  3.5 - 5.1 mmol/L Final  . Chloride 04/30/2018 103  98 - 111 mmol/L Final  . CO2 04/30/2018 29  22 - 32 mmol/L Final  . Glucose, Bld 04/30/2018 143* 70 - 99 mg/dL Final  . BUN 04/30/2018 18  8 - 23 mg/dL Final  . Creatinine, Ser 04/30/2018 1.04  0.61 - 1.24 mg/dL Final  . Calcium 04/30/2018 8.5* 8.9 - 10.3 mg/dL Final  . Total Protein 04/30/2018 6.4* 6.5 - 8.1 g/dL Final  . Albumin 04/30/2018 3.3* 3.5 - 5.0 g/dL Final  . AST 04/30/2018 22  15 -  41 U/L Final  . ALT 04/30/2018 10  0 - 44 U/L Final  . Alkaline Phosphatase 04/30/2018 105  38 - 126 U/L Final  . Total Bilirubin 04/30/2018 0.6  0.3 - 1.2 mg/dL Final  . GFR calc non Af Amer 04/30/2018 >60  >60 mL/min Final  . GFR calc Af Amer 04/30/2018 >60  >60 mL/min Final  . Anion gap 04/30/2018 7  5 - 15 Final   Performed at [facility], [address], [place], [place] [address]  . WBC 04/30/2018 8.2  4.0 - 10.5 K/uL Final  . RBC 04/30/2018 4.72  4.22 - 5.81 MIL/uL Final  . Hemoglobin 04/30/2018 13.3  13.0 - 17.0 g/dL Final  . HCT 04/30/2018 42.2  39.0 - 52.0 % Final  . MCV 04/30/2018 89.4  80.0 - 100.0 fL Final  . MCH 04/30/2018 28.2  26.0 - 34.0 pg Final  . MCHC 04/30/2018 31.5  30.0 - 36.0 g/dL Final  . RDW 04/30/2018 13.9  11.5 - 15.5 % Final  . Platelets 04/30/2018 197  150 - 400 K/uL Final  . nRBC 04/30/2018 0.0  0.0 -  0.2 % Final  . Neutrophils Relative % 04/30/2018 52  % Final  . Neutro Abs 04/30/2018 4.3  1.7 - 7.7 K/uL Final  . Lymphocytes Relative 04/30/2018 30  % Final  . Lymphs Abs 04/30/2018 2.4  0.7 - 4.0 K/uL Final  . Monocytes Relative 04/30/2018 13  % Final  . Monocytes Absolute 04/30/2018 1.0  0.1 - 1.0 K/uL Final  . Eosinophils Relative 04/30/2018 4  % Final  . Eosinophils Absolute 04/30/2018 0.3  0.0 - 0.5 K/uL Final  . Basophils Relative 04/30/2018 1  % Final  . Basophils Absolute 04/30/2018 0.1  0.0 - 0.1 K/uL Final  . Immature Granulocytes 04/30/2018 0  % Final  . Abs Immature Granulocytes 04/30/2018 0.03  0.00 - 0.07 K/uL Final   Performed at [facility], [address], [place], [place] [address]  . Magnesium 04/30/2018 1.7  1.7 - 2.4 mg/dL Final   Performed at [facility], [address] [place], [place] [address]    Assessment:  Mecca Barga. is a 77 y.o. male with intrahepatic cholangiocarcinoma s/p ultrasound guided biopsy on 02/09/2018.  Pathology revealed adenocarcinoma.  Cytokeratin 7 (+), cytokeratin 20 (+),  HepPar-1 (-), CD10 (-).  Differential diagnosis based on IHC staining includes primary intrahepatic cholangiocarcinoma and metastatic adenocarcinoma from gastric or pancreaticobiliary primary.   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  Pancreatobiliary 5% (cholangiocacinoma, gallbladder adenocarcinoma, pancreatic adenocarcinoma) cannot be excluded.  CA19-9 was 253 (0-35).  CEA was 8.3.  AFP was 13.4.  Normal studies included:  hepatitis B core antibody total, hepatitis C antibody.  MRI abdomen without contrast on 01/18/2018 revealed significant enlargement of a lateral segment left liver lobe lesion (4.1 x 3.2 cm). There was new adjacent 3.6 x 3.3 cm gastrohepatic ligament adenopathy.  Differential considerations include hepatocellular carcinoma or metastatic disease from an unknown primary. There were smaller bilateral liver lesions which are also suspicious for hepatocellular carcinomas or metastatic disease.  PET scan on 01/28/2018 revealed hypermetabolism associated with a large ill-defined left hepatic lobe mass (SUV 7.0) as well as periportal/gastrohepatic ligament adenopathy (up to 1.5 cm: SUV 4.3).  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  There was aortic atherosclerosis, three-vessel coronary artery calcification and bilateral renal stones.  He has iron deficiency anemia.  Labs on 01/22/2018 revealed a ferritin 12 and an iron saturation of 9%. B12 and folate were normal.  Labs on 11/26/2017 revealed a normal creatinine and TSH.  Diet appears good.   EGD at [facility] on 04/17/2017 revealed diffuse atrophic mucosa in the entire stomach and multiple localized erosions without bleeding.  Pathology revealed foveolar hyperplasia c/w reactive gastropathy.  Colonoscopy at [facility] on 04/17/2017 revealed two 5-7 mm polyps in the descending colon and transverse colon  (adenomatous polyps).  Capsule study on 05/05/2017 revealed polyps x 2 in the proximal small bowel and AVMs x 2 in the terminal ileum and proximal small bowel.  Plan was anterograde and retrograde balloon enteroscopy at [facility].  Code status is DNI.  Symptomatically, he notes chronic right lower chest/upper quadrant pain.  Exam is stable.  Plan: 1. Labs today:  CBC with diff, CMP, Mg.   2. Intrahepatic cholangiocarcinoma:  Review diagnosis of cholangiocarcinoma.  Treatment is palliative.  Discuss gemcitabine alone versus gemcitabine + cisplatin.  Discuss multicenter ABC-02 trial (410 patients) comparing cisplatin + gemcitabine vs gemcitabine alone.   Median  overall survival was 11.7 vs 8.1 months, with a progression-free survival of 8 vs 5 months.    Increase toxicity with gemcitabine + cisplatin.  Side effects reviewed.  Increased neutropenia and nausea with combination therapy.   Gemcitabine alone 1000 mg/m2 day 1,8,15 every 28 days  OR   Gemcitabine 1000 mg/m2 + cisplatin 25 mg/m2 day 1 and 8 every 21 days.  Discuss potential side effects with treatment.  Patient consented to treatment.  Review use of anti-emetics.  Discuss potenital side effects associated with Decadron.  Day 1 of cycle #1 gemcitabine + cisplatin today. 3. Iron deficiency anemia: Hemoglobin and MCV are normal. Continue oral iron daily with a source of vitamin C. Goal ferritin is 100. 4. Pain symptom management:  Pain is fairly well controlled.  Continue to monitor. 5. RTC on 05/07/2018 for MD assessment, labs (CBC with differential, CMP), and day 8 of cycle #1 cisplatin and gemcitabine. 6.   RTC on 05/14/2018 for labs (CBC with diff, BMP, Mg) +/- IVF and IV Mg. 7.   RTC on 05/21/2018 for MD assessment, labs (CBC with diff, CMP, Mg), and day 1 of cycle #2 cisplatin + gemcitabine. 8.   RTC on 05/28/2018 for MD assessment, labs (CBC with diff, CMP, Mg), and day 8 of cycle #2 cisplatin + gemcitabine.  Addendum:   Patient did not receive chemotherapy today.  Despite 1.5 liters of IVF, patient voided very little and thus was unable to receive cisplatin safely.  Bladder ultrasound at the end of the day revealed minimal urine.  Patient notes that he only voids 2 times/day (morning and night).  Suspect patient was dry.   Lequita Asal, MD  05/03/2018, 5:56 AM   I saw and evaluated the patient, participating in the key portions of the service and reviewing pertinent diagnostic studies and records.  I reviewed the nurse practitioner's note and agree with the findings and the plan.  The assessment and plan were discussed with the patient.  Multiple questions were asked by the patient and answered.   Nolon Stalls, MD 05/03/2018,5:56 AM

## 2018-05-05 ENCOUNTER — Ambulatory Visit: Payer: No Typology Code available for payment source

## 2018-05-07 ENCOUNTER — Encounter: Payer: Self-pay | Admitting: Hematology and Oncology

## 2018-05-07 ENCOUNTER — Other Ambulatory Visit: Payer: Self-pay

## 2018-05-07 ENCOUNTER — Inpatient Hospital Stay: Payer: Medicare (Managed Care)

## 2018-05-07 ENCOUNTER — Inpatient Hospital Stay (HOSPITAL_BASED_OUTPATIENT_CLINIC_OR_DEPARTMENT_OTHER): Payer: Medicare (Managed Care) | Admitting: Hematology and Oncology

## 2018-05-07 VITALS — BP 122/81 | HR 88 | Temp 94.8°F | Resp 20 | Wt 183.9 lb

## 2018-05-07 DIAGNOSIS — Z87891 Personal history of nicotine dependence: Secondary | ICD-10-CM

## 2018-05-07 DIAGNOSIS — G893 Neoplasm related pain (acute) (chronic): Secondary | ICD-10-CM | POA: Diagnosis not present

## 2018-05-07 DIAGNOSIS — C221 Intrahepatic bile duct carcinoma: Secondary | ICD-10-CM | POA: Diagnosis not present

## 2018-05-07 DIAGNOSIS — D509 Iron deficiency anemia, unspecified: Secondary | ICD-10-CM

## 2018-05-07 DIAGNOSIS — R634 Abnormal weight loss: Secondary | ICD-10-CM | POA: Diagnosis not present

## 2018-05-07 DIAGNOSIS — E860 Dehydration: Secondary | ICD-10-CM

## 2018-05-07 DIAGNOSIS — E611 Iron deficiency: Secondary | ICD-10-CM

## 2018-05-07 DIAGNOSIS — Z5111 Encounter for antineoplastic chemotherapy: Secondary | ICD-10-CM | POA: Diagnosis not present

## 2018-05-07 LAB — COMPREHENSIVE METABOLIC PANEL
ALT: 10 U/L (ref 0–44)
AST: 21 U/L (ref 15–41)
Albumin: 3.2 g/dL — ABNORMAL LOW (ref 3.5–5.0)
Alkaline Phosphatase: 109 U/L (ref 38–126)
Anion gap: 8 (ref 5–15)
BUN: 12 mg/dL (ref 8–23)
CO2: 28 mmol/L (ref 22–32)
Calcium: 8.7 mg/dL — ABNORMAL LOW (ref 8.9–10.3)
Chloride: 105 mmol/L (ref 98–111)
Creatinine, Ser: 0.91 mg/dL (ref 0.61–1.24)
GFR calc Af Amer: >60 mL/min (ref >60)
GFR calc non Af Amer: >60 mL/min (ref >60)
Glucose, Bld: 108 mg/dL — ABNORMAL HIGH (ref 70–99)
Potassium: 3.8 mmol/L (ref 3.5–5.1)
Sodium: 141 mmol/L (ref 135–145)
Total Bilirubin: 0.8 mg/dL (ref 0.3–1.2)
Total Protein: 6.4 g/dL — ABNORMAL LOW (ref 6.5–8.1)

## 2018-05-07 LAB — CBC WITH DIFFERENTIAL/PLATELET
Abs Immature Granulocytes: 0.03 10*3/uL (ref 0.00–0.07)
Basophils Absolute: 0.1 10*3/uL (ref 0.0–0.1)
Basophils Relative: 1 %
Eosinophils Absolute: 0.2 10*3/uL (ref 0.0–0.5)
Eosinophils Relative: 2 %
HCT: 44.8 % (ref 39.0–52.0)
Hemoglobin: 14.6 g/dL (ref 13.0–17.0)
Immature Granulocytes: 0 %
Lymphocytes Relative: 29 %
Lymphs Abs: 2.8 10*3/uL (ref 0.7–4.0)
MCH: 28.5 pg (ref 26.0–34.0)
MCHC: 32.6 g/dL (ref 30.0–36.0)
MCV: 87.3 fL (ref 80.0–100.0)
Monocytes Absolute: 1.1 10*3/uL — ABNORMAL HIGH (ref 0.1–1.0)
Monocytes Relative: 12 %
Neutro Abs: 5.3 10*3/uL (ref 1.7–7.7)
Neutrophils Relative %: 56 %
Platelets: 221 10*3/uL (ref 150–400)
RBC: 5.13 MIL/uL (ref 4.22–5.81)
RDW: 13.4 % (ref 11.5–15.5)
WBC: 9.6 10*3/uL (ref 4.0–10.5)
nRBC: 0 % (ref 0.0–0.2)

## 2018-05-07 LAB — MAGNESIUM: Magnesium: 1.8 mg/dL (ref 1.7–2.4)

## 2018-05-07 MED ORDER — SODIUM CHLORIDE 0.9 % IV SOLN
25.0000 mg/m2 | Freq: Once | INTRAVENOUS | Status: AC
  Administered 2018-05-07: 52 mg via INTRAVENOUS
  Filled 2018-05-07: qty 52

## 2018-05-07 MED ORDER — PALONOSETRON HCL INJECTION 0.25 MG/5ML
0.2500 mg | Freq: Once | INTRAVENOUS | Status: AC
  Administered 2018-05-07: 0.25 mg via INTRAVENOUS
  Filled 2018-05-07: qty 5

## 2018-05-07 MED ORDER — SODIUM CHLORIDE 0.9 % IV SOLN
Freq: Once | INTRAVENOUS | Status: AC
  Administered 2018-05-07: 10:00:00 via INTRAVENOUS
  Filled 2018-05-07: qty 250

## 2018-05-07 MED ORDER — SODIUM CHLORIDE 0.9 % IV SOLN
Freq: Once | INTRAVENOUS | Status: AC
  Administered 2018-05-07: 12:00:00 via INTRAVENOUS
  Filled 2018-05-07: qty 250

## 2018-05-07 MED ORDER — SODIUM CHLORIDE 0.9 % IV SOLN
2000.0000 mg | Freq: Once | INTRAVENOUS | Status: AC
  Administered 2018-05-07: 2000 mg via INTRAVENOUS
  Filled 2018-05-07: qty 52.6

## 2018-05-07 MED ORDER — SODIUM CHLORIDE 0.9 % IV SOLN
Freq: Once | INTRAVENOUS | Status: AC
  Administered 2018-05-07: 13:00:00 via INTRAVENOUS
  Filled 2018-05-07: qty 5

## 2018-05-07 MED ORDER — SODIUM CHLORIDE 0.9% FLUSH
10.0000 mL | Freq: Once | INTRAVENOUS | Status: AC
  Administered 2018-05-07: 10 mL via INTRAVENOUS
  Filled 2018-05-07: qty 10

## 2018-05-07 MED ORDER — POTASSIUM CHLORIDE 2 MEQ/ML IV SOLN
Freq: Once | INTRAVENOUS | Status: AC
  Administered 2018-05-07: 10:00:00 via INTRAVENOUS
  Filled 2018-05-07: qty 1000

## 2018-05-07 MED ORDER — HEPARIN SOD (PORK) LOCK FLUSH 100 UNIT/ML IV SOLN
500.0000 [IU] | Freq: Once | INTRAVENOUS | Status: AC
  Administered 2018-05-07: 500 [IU] via INTRAVENOUS
  Filled 2018-05-07: qty 5

## 2018-05-07 NOTE — Progress Notes (Signed)
Patient with 123ml urine output at this time. MD, Dr. Mike Gip, notified and aware. Per MD order: release order from supportive therapy plan and infuse 0.9% Sodium Chloride at 260ml/hr along with scheduled treatment. Proceed with Aloxi, Dexamethasone/Emend, and Gemzar infusion at this time.  1336- Patient with 245ml of additional urine output, making a total of 329ml urine output at this time. MD, Dr. Mike Gip, notified. Per MD order: Stop 0.9% Sodium Chloride at 291ml/hr infusion at this time and proceed with administering post-hydration (Dextrose 5% and 0.45% NaCl with Potassium Chloride 73mEq, Magnesium Sulfate 69mEq) along with Cisplatin at this time.

## 2018-05-07 NOTE — Progress Notes (Signed)
Here for follow up . I doing "good I guess " on 02 at 2L  -prn per pt.

## 2018-05-07 NOTE — Progress Notes (Signed)
[facility] day:  05/07/2018   Chief Complaint: Jimmy Hill. is a 77 y.o. 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  HPI:  The patient was last seen in the medical oncology clinic on 04/30/2018.  At that time, he noted chronic right lower chest/upper quadrant pain.  Exam was stable.  BUN was 18.  Creatinine was 1.04.  Plan was to initiate cisplatin and gemcitabine.  Despite hydration (1.5 liters), patient voided minimally.  Bladder ultrasound revealed a small volume of urine.  Chemotherapy was held.  During the interim, he has done well.  He notes that his dog, Peanut, died since last visit.  He states that the dog was his family.    He is drinking fluids a little better. He states that he drinks juice well.  He describes drinking fluid "in spells".  He has not eaten well since his last visit.  He states "I'm not hungry".   Past Medical History:  Diagnosis Date  . Allergic rhinitis   . Anginal pain ([place])   . Arthritis   . Autonomic dysreflexia   . Back pain    CHRONIC LBP,STENOSIS  . CHF (congestive heart failure) ([place])   . Chronic kidney disease    STAGE 3  . COPD (chronic obstructive pulmonary disease) ([place])   . Coronary artery disease   . Depression   . Diabetes ([place])   . Diabetes mellitus without complication ([place])   . DJD (degenerative joint disease)   . Dysphagia   . Dysrhythmia    A-fib, now converted  . Edema    FEET/ANKLES  . GERD (gastroesophageal reflux disease)   . Glaucoma   . H/O orthostatic hypotension   . Hypercholesterolemia   . Myocardial infarction ([place]) 2000  . Parkinson's disease ([place])   . Peripheral vascular disease ([place])   . Pneumonia    history of   . Polyneuropathy   . RLS (restless legs syndrome)   . Seizures ([place])    when in rehab for his hip  . Sleep apnea   . Smoker   . Spinal stenosis   . Strabismic amblyopia   .  Tinnitus     Past Surgical History:  Procedure Laterality Date  . ANKLE SURGERY    . APPENDECTOMY    . BACK SURGERY    . CATARACT EXTRACTION W/PHACO Left 08/26/2016   Procedure: CATARACT EXTRACTION PHACO AND INTRAOCULAR LENS PLACEMENT (IOC);  Surgeon: Birder Robson, MD;  Location: [facility] ORS;  Service: Ophthalmology;  Laterality: Left;  [place] 01:37 AP% 19.4 CDE 18.97 fluid pack lot # [id number]  . CORONARY ANGIOPLASTY     STENT  . CTR    . ESOPHAGOGASTRODUODENOSCOPY (EGD) WITH PROPOFOL N/A 03/04/2016   Procedure: ESOPHAGOGASTRODUODENOSCOPY (EGD) WITH PROPOFOL;  Surgeon: [name], MD;  Location: [facility] ENDOSCOPY;  Service: Endoscopy;  Laterality: N/A;  . EYE SURGERY     cataract  . FRACTURE SURGERY    . HIP PINNING,CANNULATED Left 12/04/2015   Procedure: CANNULATED HIP PINNING;  Surgeon: Thornton Park, MD;  Location: [facility] ORS;  Service: Orthopedics;  Laterality: Left;  . PORTA CATH INSERTION N/A 04/28/2018   Procedure: PORTA CATH INSERTION;  Surgeon: Katha Cabal, MD;  Location: [facility];  Service: Cardiovascular;  Laterality: N/A;    Family History  Problem Relation Age of Onset  . Heart disease Father   . Diabetes Mellitus II Other  Social History:  reports that he quit smoking about 2 years ago. His smoking use included cigarettes. He smoked 0.50 packs per day. He has never used smokeless tobacco. He reports that he does not drink alcohol or use drugs.  He smoked 2 packs/week x 60 years.  He is unaware of any exposures to radiation or toxins.  He is a retired Production designer, theatre/television/film.  He lives alone.  He has a wheelchair at his house (Parkinson's, diabetes with neuropathy).  He is able to perform is ADLs.  He has aides come out during the day.  His wife has dementia and lives in [facility].  Daughter is [name]. His dog, Peanut, died last week.  The patient is alone today.   Allergies:  Allergies  Allergen Reactions  . Bupropion Other (See Comments)     Seizure  . Bactrim [Sulfamethoxazole-Trimethoprim] Other (See Comments)  . Cheese     Patient doesn't want any cheese    Current Medications: Current Outpatient Medications  Medication Sig Dispense Refill  . acetaminophen (TYLENOL) 325 MG tablet Take 650 mg by mouth every 6 (six) hours as needed.    Marland Kitchen albuterol (PROVENTIL HFA;VENTOLIN HFA) 108 (90 Base) MCG/ACT inhaler Inhale into the lungs every 6 (six) hours as needed for wheezing or shortness of breath.    Marland Kitchen albuterol (PROVENTIL) (2.5 MG/3ML) 0.083% nebulizer solution Take 2.5 mg by nebulization 4 (four) times daily as needed for wheezing or shortness of breath.    Marland Kitchen amiodarone (PACERONE) 100 MG tablet Take 100 mg by mouth daily.    Marland Kitchen atorvastatin (LIPITOR) 80 MG tablet Take 80 mg by mouth at bedtime.    . brimonidine-timolol (COMBIGAN) 0.2-0.5 % ophthalmic solution Place 1 drop into both eyes 2 (two) times daily.    . Calcium Carbonate-Vitamin D (CALCIUM 600+D PO) Take 1 tablet by mouth daily.    . carbidopa-levodopa (SINEMET IR) 25-250 MG tablet Take 2 tablets by mouth every morning (8AM), 1 tablet at noon, 1 tablet every afternoon (430PM) and 1 tablet at bedtime (10PM)    . Carboxymethylcellul-Glycerin (REFRESH OPTIVE OP) Apply 2 drops to eye every 8 (eight) hours as needed (dry eyes).    . Dextromethorphan-Guaifenesin (TUSSIN DM) 10-100 MG/5ML liquid Take 5 mLs by mouth every 6 (six) hours as needed (cough).     Marland Kitchen dextrose (GLUTOSE 15) 40 % GEL Take 1 Tube by mouth. For blood sugar less than 70, repeat in 5 minutes up to 2 doses     . ferrous sulfate 325 (65 FE) MG tablet Take 325 mg by mouth daily with breakfast.    . FLUDROCORTISONE ACETATE PO Take 0.1 mg by mouth daily.    Marland Kitchen ibuprofen (ADVIL,MOTRIN) 200 MG tablet Take 400 mg by mouth every 6 (six) hours as needed.    . insulin glargine (LANTUS) 100 UNIT/ML injection Inject 20 Units into the skin every morning.     . isosorbide mononitrate (ISMO,MONOKET) 10 MG tablet Take 5 mg by  mouth at bedtime.     . Lenvatinib 12 mg daily dose (LENVIMA) 3 x 4 MG capsule Take 12 mg by mouth daily. 90 capsule 0  . levETIRAcetam (KEPPRA) 500 MG tablet Take 500 mg by mouth 2 (two) times daily.    Marland Kitchen lidocaine (LIDODERM) 5 % Place 1 patch onto the skin daily. Remove & Discard patch within 12 hours or as directed by MD    . magnesium oxide (MAG-OX) 400 MG tablet Take 400 mg by mouth  daily.    . metFORMIN (GLUCOPHAGE) 1000 MG tablet Take 1,000 mg by mouth 2 (two) times daily with a meal.     . metoprolol succinate (TOPROL-XL) 25 MG 24 hr tablet Take 25 mg by mouth daily.    . mirabegron ER (MYRBETRIQ) 50 MG TB24 tablet Take 50 mg by mouth daily.    . mometasone-formoterol (DULERA) 100-5 MCG/ACT AERO Inhale 2 puffs into the lungs 2 (two) times daily.    . naloxone (NARCAN) nasal spray 4 mg/0.1 mL Place 1 spray into the nose.    Marland Kitchen Oxycodone HCl 10 MG TABS Take 10 mg by mouth 3 (three) times daily as needed.     . pantoprazole (PROTONIX) 40 MG tablet Take 40 mg by mouth daily.    . polyethylene glycol (MIRALAX / GLYCOLAX) packet Take 17 g by mouth daily.    . potassium chloride (K-DUR,KLOR-CON) 10 MEQ tablet Take 40 mEq by mouth daily.     Marland Kitchen senna-docusate (SENOKOT-S) 8.6-50 MG tablet Take 2 tablets by mouth at bedtime as needed.     . traMADol (ULTRAM) 50 MG tablet Take 50 mg by mouth every 6 (six) hours as needed for moderate pain.    . vitamin B-12 (CYANOCOBALAMIN) 1000 MCG tablet Take 1,000 mcg by mouth daily.    . Vitamins A & D (VITAMIN A & D) ointment Apply 1 application topically as needed for dry skin.    Marland Kitchen dexamethasone (DECADRON) 4 MG tablet Take 2 tablets by mouth once a day on the day after chemotherapy and then take 1 tablet two times a day for 2 days. Take with food. (Patient not taking: Reported on 05/07/2018) 30 tablet 1  . lidocaine-prilocaine (EMLA) cream Apply to affected area once (Patient not taking: Reported on 05/07/2018) 30 g 3  . loperamide (IMODIUM) 2 MG capsule Take  by mouth as needed for diarrhea or loose stools.    . nitroGLYCERIN (NITROSTAT) 0.4 MG SL tablet Place 0.4 mg under the tongue every 5 (five) minutes as needed for chest pain.    Marland Kitchen ondansetron (ZOFRAN) 8 MG tablet Take 1 tablet (8 mg total) by mouth 2 (two) times daily as needed. Start on the third day after chemotherapy. (Patient not taking: Reported on 05/07/2018) 30 tablet 1  . pregabalin (LYRICA) 50 MG capsule Take 100 mg by mouth every evening.     . prochlorperazine (COMPAZINE) 10 MG tablet Take 1 tablet (10 mg total) by mouth every 6 (six) hours as needed (Nausea or vomiting). (Patient not taking: Reported on 05/07/2018) 30 tablet 1   No current facility-administered medications for this visit.     Review of Systems  Constitutional: Positive for weight loss (8 pounds). Negative for chills, diaphoresis, fever and malaise/fatigue.       Feels "ok".  HENT: Negative.  Negative for congestion, ear discharge, ear pain, nosebleeds, sinus pain and sore throat.   Eyes: Negative.  Negative for pain, discharge and redness.       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   Respiratory: Positive for cough (chronic) and shortness of breath (exertional). Negative for hemoptysis and sputum production.        Require supplemental oxygen ATC.  Cardiovascular: Negative for chest pain, palpitations, orthopnea, leg swelling and PND.       H/o prolonged QTc.  Gastrointestinal: Negative.  Negative for abdominal pain, blood in stool, constipation, diarrhea, melena, nausea and vomiting.       Right  sided pain (upper abdomen/lower chest)-stable.  Poor appetite.  Genitourinary: Negative.  Negative for dysuria, frequency, hematuria and urgency.       Drinks fluids "in spells".  Musculoskeletal: Positive for joint pain. Negative for back pain, falls, myalgias and neck pain.  Skin: Negative.  Negative for itching and rash.  Neurological: Positive for sensory change (neuropathy in hands and  feet). Negative for dizziness, tremors, speech change, focal weakness, weakness and headaches.  Endo/Heme/Allergies: Does not bruise/bleed easily.       Diabetes.  Psychiatric/Behavioral: Negative for depression (sad with loss of dog) and memory loss. The patient is not nervous/anxious and does not have insomnia.   All other systems reviewed and are negative.  Performance status (ECOG): 2 - Symptomatic, <50% confined to bed  Vital Signs: BP 122/81 (BP Location: Left Arm, Patient Position: Sitting)   Pulse 88   Temp (!) 94.8 F (34.9 C) (Tympanic)   Resp 20   Wt 183 lb 14.4 oz (83.4 kg)   BMI 27.96 kg/m   Physical Exam  Constitutional: He is oriented to person, place, and time. No distress.  Chronically fatigued appearing gentleman sitting comfortably in a wheelchair in no acute distress.  HENT:  Head: Normocephalic and atraumatic.  Mouth/Throat: Oropharynx is clear and moist and mucous membranes are normal. No oropharyngeal exudate.  Gray hair.  Eyes: Pupils are equal, round, and reactive to light. Conjunctivae and EOM are normal. No scleral icterus.  Blue eyes.  Neck: Normal range of motion. Neck supple. No JVD present.  Cardiovascular: Normal rate, regular rhythm, normal heart sounds and intact distal pulses. Exam reveals no gallop and no friction rub.  No murmur heard. Pulmonary/Chest: Effort normal and breath sounds normal. No respiratory distress. He has no wheezes. He has no rales.  Supplemental oxygen in place via [place]  Abdominal: Soft. Bowel sounds are normal. He exhibits no distension and no mass. There is no abdominal tenderness. There is no rebound and no guarding.  Liver edge palpable.  Musculoskeletal: Normal range of motion.        General: No tenderness or edema.  Lymphadenopathy:    He has no cervical adenopathy.  Neurological: He is alert and oriented to person, place, and time.  Skin: Skin is warm and dry. No rash noted. He is not diaphoretic. No erythema.   Psychiatric: Mood, affect and judgment normal.  Nursing note and vitals reviewed.   Infusion on 05/07/2018  Component Date Value Ref Range Status  . Magnesium 05/07/2018 1.8  1.7 - 2.4 mg/dL Final   Performed at [facility], [address], [place], [place] [address]  . Sodium 05/07/2018 141  135 - 145 mmol/L Final  . Potassium 05/07/2018 3.8  3.5 - 5.1 mmol/L Final  . Chloride 05/07/2018 105  98 - 111 mmol/L Final  . CO2 05/07/2018 28  22 - 32 mmol/L Final  . Glucose, Bld 05/07/2018 108* 70 - 99 mg/dL Final  . BUN 05/07/2018 12  8 - 23 mg/dL Final  . Creatinine, Ser 05/07/2018 0.91  0.61 - 1.24 mg/dL Final  . Calcium 05/07/2018 8.7* 8.9 - 10.3 mg/dL Final  . Total Protein 05/07/2018 6.4* 6.5 - 8.1 g/dL Final  . Albumin 05/07/2018 3.2* 3.5 - 5.0 g/dL Final  . AST 05/07/2018 21  15 - 41 U/L Final  . ALT 05/07/2018 10  0 - 44 U/L Final  . Alkaline Phosphatase 05/07/2018 109  38 - 126 U/L Final  . Total Bilirubin 05/07/2018 0.8  0.3 -  1.2 mg/dL Final  . GFR calc non Af Amer 05/07/2018 >60  >60 mL/min Final  . GFR calc Af Amer 05/07/2018 >60  >60 mL/min Final  . Anion gap 05/07/2018 8  5 - 15 Final   Performed at [facility], [address] [place], [place] [address]  . WBC 05/07/2018 9.6  4.0 - 10.5 K/uL Final  . RBC 05/07/2018 5.13  4.22 - 5.81 MIL/uL Final  . Hemoglobin 05/07/2018 14.6  13.0 - 17.0 g/dL Final  . HCT 05/07/2018 44.8  39.0 - 52.0 % Final  . MCV 05/07/2018 87.3  80.0 - 100.0 fL Final  . MCH 05/07/2018 28.5  26.0 - 34.0 pg Final  . MCHC 05/07/2018 32.6  30.0 - 36.0 g/dL Final  . RDW 05/07/2018 13.4  11.5 - 15.5 % Final  . Platelets 05/07/2018 221  150 - 400 K/uL Final  . nRBC 05/07/2018 0.0  0.0 - 0.2 % Final  . Neutrophils Relative % 05/07/2018 56  % Final  . Neutro Abs 05/07/2018 5.3  1.7 - 7.7 K/uL Final  . Lymphocytes Relative 05/07/2018 29  % Final  . Lymphs Abs 05/07/2018 2.8  0.7 - 4.0 K/uL Final  . Monocytes Relative 05/07/2018 12  %  Final  . Monocytes Absolute 05/07/2018 1.1* 0.1 - 1.0 K/uL Final  . Eosinophils Relative 05/07/2018 2  % Final  . Eosinophils Absolute 05/07/2018 0.2  0.0 - 0.5 K/uL Final  . Basophils Relative 05/07/2018 1  % Final  . Basophils Absolute 05/07/2018 0.1  0.0 - 0.1 K/uL Final  . Immature Granulocytes 05/07/2018 0  % Final  . Abs Immature Granulocytes 05/07/2018 0.03  0.00 - 0.07 K/uL Final   Performed at [facility], [address] [place], [place] [address]    Assessment:  Deni Lefever. is a 77 y.o. male with intrahepatic cholangiocarcinoma s/p ultrasound guided biopsy on 02/09/2018.  Pathology revealed adenocarcinoma.  Cytokeratin 7 (+), cytokeratin 20 (+), HepPar-1 (-), CD10 (-).  Differential diagnosis based on IHC staining includes primary intrahepatic cholangiocarcinoma and metastatic adenocarcinoma from gastric or pancreaticobiliary primary.   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  Pancreatobiliary 5% (cholangiocacinoma, gallbladder adenocarcinoma, pancreatic adenocarcinoma) cannot be excluded.  CA19-9 was 253 (0-35).  CEA was 8.3.  AFP was 13.4.  Normal studies included:  hepatitis B core antibody total, hepatitis C antibody.  MRI abdomen without contrast on 01/18/2018 revealed significant enlargement of a lateral segment left liver lobe lesion (4.1 x 3.2 cm). There was new adjacent 3.6 x 3.3 cm gastrohepatic ligament adenopathy.  Differential considerations include hepatocellular carcinoma or metastatic disease from an unknown primary. There were smaller bilateral liver lesions which are also suspicious for hepatocellular carcinomas or metastatic disease.  PET scan on 01/28/2018 revealed hypermetabolism associated with a large ill-defined left hepatic lobe mass (SUV 7.0) as well as periportal/gastrohepatic ligament adenopathy (up to 1.5 cm: SUV 4.3).  Findings were c/w malignancy, likely hepatic in origin given no additional sites of  abnormal hypermetabolism in the neck, chest, abdomen or pelvis.  There was aortic atherosclerosis, three-vessel coronary artery calcification and bilateral renal stones.  He has iron deficiency anemia.  Labs on 01/22/2018 revealed a ferritin 12 and an iron saturation of 9%. B12 and folate were normal.  Labs on 11/26/2017 revealed a normal creatinine and TSH.  Diet appears good.   EGD at [facility] on 04/17/2017 revealed diffuse atrophic mucosa in the entire stomach and multiple localized erosions without bleeding.  Pathology revealed foveolar hyperplasia c/w reactive gastropathy.  Colonoscopy at [facility] on 04/17/2017 revealed two 5-7 mm polyps in the descending colon and transverse colon (adenomatous polyps).  Capsule study on 05/05/2017 revealed polyps x 2 in the proximal small bowel and AVMs x 2 in the terminal ileum and proximal small bowel.  Plan was anterograde and retrograde balloon enteroscopy at [facility].  Code status is DNI.  Symptomatically, he has chronic right lower chest/upper quadrant pain.  Appetite is poor.  He has lost 8 pounds.  His dog died.  Exam is stable.  Plan: 1. Labs today:  CBC with diff, CMP, Mg. 2. Intrahepatic cholangiocarcinoma:  Review diagnosis of cholangiocarcinoma.  Treatment is palliative.  Discuss plan for gemcitabine + cisplatin.  Side effects reviewed.  Patient consented to treatment.  Review use of anti-emetics.  Discuss potenital side effects associated with Decadron.  Patient did not bring in medications today.  Contact Dr Meredith Staggers to assist with medication review and importance of fluids.  Day 1 of cycle #1 gemcitabine + cisplatin today.  Preauth On-Pro Neulasta for day 8 chemotherapy. 3. Iron deficiency anemia: Hemoglobin is 14.6 and MCV 87.3. Continue oral iron daily with a source of vitamin C. Goal ferritin is 100. 4. Pain symptom management:  Pain is fairly well controlled.  Continue to monitor. 5. Weight loss:  Discuss importance of caloric  intake.  Discuss use of Megace (200 mg a day) or Marinol 2.5 mg BID (before lunch and dinner).  Side effects of medication reviewed.  Discuss with Dr. Lelon Mast. 6.   RTC on 05/14/2018 for MD assessment, labs (CBC with diff, CMP, Mg), and day 8 of cycle #1 cisplatin + gemcitabine.   Lequita Asal, MD  05/07/2018, 5:53 PM

## 2018-05-14 ENCOUNTER — Other Ambulatory Visit: Payer: Self-pay

## 2018-05-14 ENCOUNTER — Inpatient Hospital Stay: Payer: Medicare (Managed Care)

## 2018-05-14 ENCOUNTER — Encounter: Payer: Self-pay | Admitting: Hematology and Oncology

## 2018-05-14 ENCOUNTER — Inpatient Hospital Stay (HOSPITAL_BASED_OUTPATIENT_CLINIC_OR_DEPARTMENT_OTHER): Payer: Medicare (Managed Care) | Admitting: Hematology and Oncology

## 2018-05-14 VITALS — BP 130/83 | HR 77 | Temp 96.6°F

## 2018-05-14 DIAGNOSIS — E611 Iron deficiency: Secondary | ICD-10-CM

## 2018-05-14 DIAGNOSIS — R634 Abnormal weight loss: Secondary | ICD-10-CM

## 2018-05-14 DIAGNOSIS — Z5111 Encounter for antineoplastic chemotherapy: Secondary | ICD-10-CM

## 2018-05-14 DIAGNOSIS — D509 Iron deficiency anemia, unspecified: Secondary | ICD-10-CM | POA: Diagnosis not present

## 2018-05-14 DIAGNOSIS — D696 Thrombocytopenia, unspecified: Secondary | ICD-10-CM

## 2018-05-14 DIAGNOSIS — C221 Intrahepatic bile duct carcinoma: Secondary | ICD-10-CM

## 2018-05-14 DIAGNOSIS — J449 Chronic obstructive pulmonary disease, unspecified: Secondary | ICD-10-CM

## 2018-05-14 DIAGNOSIS — G893 Neoplasm related pain (acute) (chronic): Secondary | ICD-10-CM | POA: Diagnosis not present

## 2018-05-14 DIAGNOSIS — N183 Chronic kidney disease, stage 3 (moderate): Secondary | ICD-10-CM

## 2018-05-14 DIAGNOSIS — E860 Dehydration: Secondary | ICD-10-CM

## 2018-05-14 DIAGNOSIS — Z87891 Personal history of nicotine dependence: Secondary | ICD-10-CM

## 2018-05-14 LAB — CBC WITH DIFFERENTIAL/PLATELET
Abs Immature Granulocytes: 0.04 10*3/uL (ref 0.00–0.07)
Basophils Absolute: 0 10*3/uL (ref 0.0–0.1)
Basophils Relative: 0 %
Eosinophils Absolute: 0 10*3/uL (ref 0.0–0.5)
Eosinophils Relative: 0 %
HCT: 46.3 % (ref 39.0–52.0)
Hemoglobin: 15.1 g/dL (ref 13.0–17.0)
Immature Granulocytes: 1 %
Lymphocytes Relative: 11 %
Lymphs Abs: 0.9 10*3/uL (ref 0.7–4.0)
MCH: 28.1 pg (ref 26.0–34.0)
MCHC: 32.6 g/dL (ref 30.0–36.0)
MCV: 86.2 fL (ref 80.0–100.0)
Monocytes Absolute: 0.1 10*3/uL (ref 0.1–1.0)
Monocytes Relative: 1 %
Neutro Abs: 7.1 10*3/uL (ref 1.7–7.7)
Neutrophils Relative %: 87 %
Platelets: 103 10*3/uL — ABNORMAL LOW (ref 150–400)
RBC: 5.37 MIL/uL (ref 4.22–5.81)
RDW: 12.9 % (ref 11.5–15.5)
WBC: 8.2 10*3/uL (ref 4.0–10.5)
nRBC: 0 % (ref 0.0–0.2)

## 2018-05-14 LAB — COMPREHENSIVE METABOLIC PANEL
ALT: 28 U/L (ref 0–44)
AST: 27 U/L (ref 15–41)
Albumin: 3.4 g/dL — ABNORMAL LOW (ref 3.5–5.0)
Alkaline Phosphatase: 101 U/L (ref 38–126)
Anion gap: 11 (ref 5–15)
BUN: 31 mg/dL — ABNORMAL HIGH (ref 8–23)
CO2: 28 mmol/L (ref 22–32)
Calcium: 8.7 mg/dL — ABNORMAL LOW (ref 8.9–10.3)
Chloride: 97 mmol/L — ABNORMAL LOW (ref 98–111)
Creatinine, Ser: 1.1 mg/dL (ref 0.61–1.24)
GFR calc Af Amer: >60 mL/min (ref >60)
GFR calc non Af Amer: >60 mL/min (ref >60)
Glucose, Bld: 264 mg/dL — ABNORMAL HIGH (ref 70–99)
Potassium: 4.5 mmol/L (ref 3.5–5.1)
Sodium: 136 mmol/L (ref 135–145)
Total Bilirubin: 1 mg/dL (ref 0.3–1.2)
Total Protein: 6.6 g/dL (ref 6.5–8.1)

## 2018-05-14 LAB — MAGNESIUM: Magnesium: 1.8 mg/dL (ref 1.7–2.4)

## 2018-05-14 MED ORDER — PALONOSETRON HCL INJECTION 0.25 MG/5ML
0.2500 mg | Freq: Once | INTRAVENOUS | Status: AC
  Administered 2018-05-14: 0.25 mg via INTRAVENOUS
  Filled 2018-05-14: qty 5

## 2018-05-14 MED ORDER — SODIUM CHLORIDE 0.9 % IV SOLN
Freq: Once | INTRAVENOUS | Status: AC
  Administered 2018-05-14: 10:00:00 via INTRAVENOUS
  Filled 2018-05-14: qty 250

## 2018-05-14 MED ORDER — SODIUM CHLORIDE 0.9 % IV SOLN
1500.0000 mg | Freq: Once | INTRAVENOUS | Status: AC
  Administered 2018-05-14: 1482.8897 mg via INTRAVENOUS
  Filled 2018-05-14: qty 26.3

## 2018-05-14 MED ORDER — HEPARIN SOD (PORK) LOCK FLUSH 100 UNIT/ML IV SOLN
500.0000 [IU] | Freq: Once | INTRAVENOUS | Status: DC | PRN
  Filled 2018-05-14: qty 5

## 2018-05-14 MED ORDER — SODIUM CHLORIDE 0.9 % IV SOLN
25.0000 mg/m2 | Freq: Once | INTRAVENOUS | Status: AC
  Administered 2018-05-14: 52 mg via INTRAVENOUS
  Filled 2018-05-14: qty 52

## 2018-05-14 MED ORDER — SODIUM CHLORIDE 0.9 % IV SOLN
Freq: Once | INTRAVENOUS | Status: AC
  Administered 2018-05-14: 13:00:00 via INTRAVENOUS
  Filled 2018-05-14: qty 250

## 2018-05-14 MED ORDER — HEPARIN SOD (PORK) LOCK FLUSH 100 UNIT/ML IV SOLN
500.0000 [IU] | Freq: Once | INTRAVENOUS | Status: AC
  Administered 2018-05-14: 500 [IU] via INTRAVENOUS

## 2018-05-14 MED ORDER — POTASSIUM CHLORIDE 2 MEQ/ML IV SOLN
Freq: Once | INTRAVENOUS | Status: AC
  Administered 2018-05-14: 10:00:00 via INTRAVENOUS
  Filled 2018-05-14: qty 1000

## 2018-05-14 MED ORDER — SODIUM CHLORIDE 0.9 % IV SOLN
Freq: Once | INTRAVENOUS | Status: AC
  Administered 2018-05-14: 13:00:00 via INTRAVENOUS
  Filled 2018-05-14: qty 5

## 2018-05-14 MED ORDER — SODIUM CHLORIDE 0.9 % IV SOLN
Freq: Once | INTRAVENOUS | Status: DC
  Filled 2018-05-14: qty 250

## 2018-05-14 MED ORDER — SODIUM CHLORIDE 0.9% FLUSH
10.0000 mL | Freq: Once | INTRAVENOUS | Status: AC
  Administered 2018-05-14: 10 mL via INTRAVENOUS
  Filled 2018-05-14: qty 10

## 2018-05-14 NOTE — Progress Notes (Signed)
[facility] day:  05/14/2018   Chief Complaint: Jimmy Hill. is a 77 y.o. male with intrahepatic cholangiocarcinoma who is seen for assessment prior to day 8 of cycle #1 cisplatin and gemcitabine.  HPI:  The patient was last seen in the medical oncology clinic on 05/07/2018.  At that time,  he had chronic right lower chest/upper quadrant pain.  Appetite was poor.  He had lost 8 pounds.  He was sad as his dog just died.  Exam was stable. Creatinine was 0.91.  He received day 1 of cycle #1 cisplatin and gemcitabine.  During the interim, he has done well.  He denies any nausea, vomiting or diarrhea.  He denies any side effects from chemotherapy.  He states that his blood sugar did go up.  He notes that Dr. Meredith Staggers checked in on him.   Past Medical History:  Diagnosis Date  . Allergic rhinitis   . Anginal pain ([place])   . Arthritis   . Autonomic dysreflexia   . Back pain    CHRONIC LBP,STENOSIS  . CHF (congestive heart failure) ([place])   . Chronic kidney disease    STAGE 3  . COPD (chronic obstructive pulmonary disease) ([place])   . Coronary artery disease   . Depression   . Diabetes ([place])   . Diabetes mellitus without complication ([place])   . DJD (degenerative joint disease)   . Dysphagia   . Dysrhythmia    A-fib, now converted  . Edema    FEET/ANKLES  . GERD (gastroesophageal reflux disease)   . Glaucoma   . H/O orthostatic hypotension   . Hypercholesterolemia   . Myocardial infarction ([place]) 2000  . Parkinson's disease ([place])   . Peripheral vascular disease ([place])   . Pneumonia    history of   . Polyneuropathy   . RLS (restless legs syndrome)   . Seizures ([place])    when in rehab for his hip  . Sleep apnea   . Smoker   . Spinal stenosis   . Strabismic amblyopia   . Tinnitus     Past Surgical History:  Procedure Laterality Date  . ANKLE SURGERY    . APPENDECTOMY    . BACK SURGERY    . CATARACT EXTRACTION W/PHACO Left  08/26/2016   Procedure: CATARACT EXTRACTION PHACO AND INTRAOCULAR LENS PLACEMENT (IOC);  Surgeon: Birder Robson, MD;  Location: [facility] ORS;  Service: Ophthalmology;  Laterality: Left;  [place] 01:37 AP% 19.4 CDE 18.97 fluid pack lot # [id number]  . CORONARY ANGIOPLASTY     STENT  . CTR    . ESOPHAGOGASTRODUODENOSCOPY (EGD) WITH PROPOFOL N/A 03/04/2016   Procedure: ESOPHAGOGASTRODUODENOSCOPY (EGD) WITH PROPOFOL;  Surgeon: [name], MD;  Location: [facility] ENDOSCOPY;  Service: Endoscopy;  Laterality: N/A;  . EYE SURGERY     cataract  . FRACTURE SURGERY    . HIP PINNING,CANNULATED Left 12/04/2015   Procedure: CANNULATED HIP PINNING;  Surgeon: Thornton Park, MD;  Location: [facility] ORS;  Service: Orthopedics;  Laterality: Left;  . PORTA CATH INSERTION N/A 04/28/2018   Procedure: PORTA CATH INSERTION;  Surgeon: Katha Cabal, MD;  Location: [place] [facility];  Service: Cardiovascular;  Laterality: N/A;    Family History  Problem Relation Age of Onset  . Heart disease Father   . Diabetes Mellitus II Other     Social History:  reports that he quit smoking about 2 years ago. His smoking use included cigarettes. He smoked 0.50 packs  per day. He has never used smokeless tobacco. He reports that he does not drink alcohol or use drugs.  He smoked 2 packs/week x 60 years.  He is unaware of any exposures to radiation or toxins.  He is a retired Production designer, theatre/television/film.  He lives alone.  He has a wheelchair at his house (Parkinson's, diabetes with neuropathy).  He is able to perform is ADLs.  He has aides come out during the day.  His wife has dementia and lives in the [facility].  Daughter is [name]. His dog, Peanut, died in 05/21/2018.  He is considering getting a new dog.  The patient is alone today.   Allergies:  Allergies  Allergen Reactions  . Bupropion Other (See Comments)    Seizure  . Bactrim [Sulfamethoxazole-Trimethoprim] Other (See Comments)  . Cheese     Patient doesn't want any  cheese    Current Medications: Current Outpatient Medications  Medication Sig Dispense Refill  . acetaminophen (TYLENOL) 325 MG tablet Take 650 mg by mouth every 6 (six) hours as needed.    Marland Kitchen albuterol (PROVENTIL HFA;VENTOLIN HFA) 108 (90 Base) MCG/ACT inhaler Inhale into the lungs every 6 (six) hours as needed for wheezing or shortness of breath.    Marland Kitchen albuterol (PROVENTIL) (2.5 MG/3ML) 0.083% nebulizer solution Take 2.5 mg by nebulization 4 (four) times daily as needed for wheezing or shortness of breath.    Marland Kitchen amiodarone (PACERONE) 100 MG tablet Take 100 mg by mouth daily.    Marland Kitchen atorvastatin (LIPITOR) 80 MG tablet Take 80 mg by mouth at bedtime.    . brimonidine-timolol (COMBIGAN) 0.2-0.5 % ophthalmic solution Place 1 drop into both eyes 2 (two) times daily.    . Calcium Carbonate-Vitamin D (CALCIUM 600+D PO) Take 1 tablet by mouth daily.    . carbidopa-levodopa (SINEMET IR) 25-250 MG tablet Take 2 tablets by mouth every morning (8AM), 1 tablet at noon, 1 tablet every afternoon (430PM) and 1 tablet at bedtime (10PM)    . Carboxymethylcellul-Glycerin (REFRESH OPTIVE OP) Apply 2 drops to eye every 8 (eight) hours as needed (dry eyes).    Marland Kitchen dexamethasone (DECADRON) 4 MG tablet Take 2 tablets by mouth once a day on the day after chemotherapy and then take 1 tablet two times a day for 2 days. Take with food. 30 tablet 1  . Dextromethorphan-Guaifenesin (TUSSIN DM) 10-100 MG/5ML liquid Take 5 mLs by mouth every 6 (six) hours as needed (cough).     Marland Kitchen dextrose (GLUTOSE 15) 40 % GEL Take 1 Tube by mouth. For blood sugar less than 70, repeat in 5 minutes up to 2 doses     . ferrous sulfate 325 (65 FE) MG tablet Take 325 mg by mouth daily with breakfast.    . FLUDROCORTISONE ACETATE PO Take 0.1 mg by mouth daily.    Marland Kitchen ibuprofen (ADVIL,MOTRIN) 200 MG tablet Take 400 mg by mouth every 6 (six) hours as needed.    . insulin glargine (LANTUS) 100 UNIT/ML injection Inject 20 Units into the skin every morning.      . isosorbide mononitrate (ISMO,MONOKET) 10 MG tablet Take 5 mg by mouth at bedtime.     . Lenvatinib 12 mg daily dose (LENVIMA) 3 x 4 MG capsule Take 12 mg by mouth daily. 90 capsule 0  . levETIRAcetam (KEPPRA) 500 MG tablet Take 500 mg by mouth 2 (two) times daily.    Marland Kitchen lidocaine (LIDODERM) 5 % Place 1 patch onto the skin daily. Remove &  Discard patch within 12 hours or as directed by MD    . lidocaine-prilocaine (EMLA) cream Apply to affected area once 30 g 3  . loperamide (IMODIUM) 2 MG capsule Take by mouth as needed for diarrhea or loose stools.    . magnesium oxide (MAG-OX) 400 MG tablet Take 400 mg by mouth daily.    . metFORMIN (GLUCOPHAGE) 1000 MG tablet Take 1,000 mg by mouth 2 (two) times daily with a meal.     . metoprolol succinate (TOPROL-XL) 25 MG 24 hr tablet Take 25 mg by mouth daily.    . mirabegron ER (MYRBETRIQ) 50 MG TB24 tablet Take 50 mg by mouth daily.    . mometasone-formoterol (DULERA) 100-5 MCG/ACT AERO Inhale 2 puffs into the lungs 2 (two) times daily.    . naloxone (NARCAN) nasal spray 4 mg/0.1 mL Place 1 spray into the nose.    . nitroGLYCERIN (NITROSTAT) 0.4 MG SL tablet Place 0.4 mg under the tongue every 5 (five) minutes as needed for chest pain.    Marland Kitchen ondansetron (ZOFRAN) 8 MG tablet Take 1 tablet (8 mg total) by mouth 2 (two) times daily as needed. Start on the third day after chemotherapy. 30 tablet 1  . Oxycodone HCl 10 MG TABS Take 10 mg by mouth 3 (three) times daily as needed.     . pantoprazole (PROTONIX) 40 MG tablet Take 40 mg by mouth daily.    . polyethylene glycol (MIRALAX / GLYCOLAX) packet Take 17 g by mouth daily.    . potassium chloride (K-DUR,KLOR-CON) 10 MEQ tablet Take 40 mEq by mouth daily.     . pregabalin (LYRICA) 50 MG capsule Take 100 mg by mouth every evening.     . prochlorperazine (COMPAZINE) 10 MG tablet Take 1 tablet (10 mg total) by mouth every 6 (six) hours as needed (Nausea or vomiting). 30 tablet 1  . senna-docusate (SENOKOT-S)  8.6-50 MG tablet Take 2 tablets by mouth at bedtime as needed.     . traMADol (ULTRAM) 50 MG tablet Take 50 mg by mouth every 6 (six) hours as needed for moderate pain.    . vitamin B-12 (CYANOCOBALAMIN) 1000 MCG tablet Take 1,000 mcg by mouth daily.    . Vitamins A & D (VITAMIN A & D) ointment Apply 1 application topically as needed for dry skin.     No current facility-administered medications for this visit.    Facility-Administered Medications Ordered in Other Visits  Medication Dose Route Frequency Provider Last Rate Last Dose  . heparin lock flush 100 unit/mL  500 Units Intravenous Once Lequita Asal, MD        Review of Systems  Constitutional: Negative for chills, diaphoresis, fever, malaise/fatigue and weight loss (no new weight).       Feels "fine".  Energy level described as 75%.  HENT: Negative.  Negative for congestion, ear discharge, ear pain, nosebleeds, sinus pain and sore throat.   Eyes: Negative for pain, discharge and redness.       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   Respiratory: Positive for cough (chronic) and shortness of breath (exertional). Negative for hemoptysis, sputum production and wheezing.        Requires supplemental oxygen ATC.  Cardiovascular: Negative for chest pain, palpitations, orthopnea, leg swelling and PND.       H/o prolonged QTc.  Gastrointestinal: Negative.  Negative for abdominal pain, blood in stool, constipation, diarrhea, melena, nausea and vomiting.  Right sided pain (upper abdomen/lower chest)-stable.  Genitourinary: Negative.  Negative for dysuria, frequency, hematuria and urgency.       Drinks fluids "in spells".  Voids mostly at night.  Musculoskeletal: Positive for joint pain. Negative for back pain, falls, myalgias and neck pain.  Skin: Negative.  Negative for itching and rash.  Neurological: Positive for sensory change (neuropathy in hands and feet). Negative for dizziness, tremors, speech  change, focal weakness, weakness and headaches.  Endo/Heme/Allergies: Does not bruise/bleed easily.       Diabetes.  Psychiatric/Behavioral: Negative.  Negative for depression, memory loss and suicidal ideas. The patient is not nervous/anxious and does not have insomnia.   All other systems reviewed and are negative.  Performance status (ECOG): 2  Vital Signs: BP 130/83 (BP Location: Left Arm, Patient Position: Sitting)   Pulse 77   Temp (!) 96.6 F (35.9 C) (Tympanic)   SpO2 99%   Physical Exam  Constitutional: He is oriented to person, place, and time. No distress.  Chronically fatigued appearing gentleman sitting comfortably in a wheelchair in no acute distress.  HENT:  Head: Normocephalic and atraumatic.  Mouth/Throat: Mucous membranes are normal. No oropharyngeal exudate.  Gray hair.  [place] in place.  Mouth sticky/dry.  Eyes: Pupils are equal, round, and reactive to light. Conjunctivae and EOM are normal. No scleral icterus.  Blue eyes.  Neck: Normal range of motion. Neck supple. No JVD present.  Cardiovascular: Normal rate, regular rhythm, normal heart sounds and intact distal pulses. Exam reveals no gallop and no friction rub.  No murmur heard. Pulmonary/Chest: Effort normal and breath sounds normal. No respiratory distress. He has no wheezes. He has no rales.  Supplemental oxygen in place via [place]  Abdominal: Soft. Bowel sounds are normal. He exhibits no distension and no mass. There is no abdominal tenderness. There is no rebound and no guarding.  Liver edge palpable.  Musculoskeletal: Normal range of motion.        General: No tenderness or edema.  Lymphadenopathy:    He has no cervical adenopathy.  Neurological: He is alert and oriented to person, place, and time.  Skin: Skin is warm and dry. No rash noted. He is not diaphoretic. No erythema.  Psychiatric: Mood, affect and judgment normal.  Nursing note and vitals reviewed.   Infusion on 05/14/2018  Component Date  Value Ref Range Status  . Magnesium 05/14/2018 1.8  1.7 - 2.4 mg/dL Final   Performed at [facility], [address], [place], [place] [address]  . Sodium 05/14/2018 136  135 - 145 mmol/L Final  . Potassium 05/14/2018 4.5  3.5 - 5.1 mmol/L Final  . Chloride 05/14/2018 97* 98 - 111 mmol/L Final  . CO2 05/14/2018 28  22 - 32 mmol/L Final  . Glucose, Bld 05/14/2018 264* 70 - 99 mg/dL Final  . BUN 05/14/2018 31* 8 - 23 mg/dL Final  . Creatinine, Ser 05/14/2018 1.10  0.61 - 1.24 mg/dL Final  . Calcium 05/14/2018 8.7* 8.9 - 10.3 mg/dL Final  . Total Protein 05/14/2018 6.6  6.5 - 8.1 g/dL Final  . Albumin 05/14/2018 3.4* 3.5 - 5.0 g/dL Final  . AST 05/14/2018 27  15 - 41 U/L Final  . ALT 05/14/2018 28  0 - 44 U/L Final  . Alkaline Phosphatase 05/14/2018 101  38 - 126 U/L Final  . Total Bilirubin 05/14/2018 1.0  0.3 - 1.2 mg/dL Final  . GFR calc non Af Amer 05/14/2018 >60  >60 mL/min Final  .  GFR calc Af Amer 05/14/2018 >60  >60 mL/min Final  . Anion gap 05/14/2018 11  5 - 15 Final   Performed at [facility], [address] [place], [place] [address]  . WBC 05/14/2018 8.2  4.0 - 10.5 K/uL Final  . RBC 05/14/2018 5.37  4.22 - 5.81 MIL/uL Final  . Hemoglobin 05/14/2018 15.1  13.0 - 17.0 g/dL Final  . HCT 05/14/2018 46.3  39.0 - 52.0 % Final  . MCV 05/14/2018 86.2  80.0 - 100.0 fL Final  . MCH 05/14/2018 28.1  26.0 - 34.0 pg Final  . MCHC 05/14/2018 32.6  30.0 - 36.0 g/dL Final  . RDW 05/14/2018 12.9  11.5 - 15.5 % Final  . Platelets 05/14/2018 103* 150 - 400 K/uL Final  . nRBC 05/14/2018 0.0  0.0 - 0.2 % Final  . Neutrophils Relative % 05/14/2018 87  % Final  . Neutro Abs 05/14/2018 7.1  1.7 - 7.7 K/uL Final  . Lymphocytes Relative 05/14/2018 11  % Final  . Lymphs Abs 05/14/2018 0.9  0.7 - 4.0 K/uL Final  . Monocytes Relative 05/14/2018 1  % Final  . Monocytes Absolute 05/14/2018 0.1  0.1 - 1.0 K/uL Final  . Eosinophils Relative 05/14/2018 0  % Final  . Eosinophils  Absolute 05/14/2018 0.0  0.0 - 0.5 K/uL Final  . Basophils Relative 05/14/2018 0  % Final  . Basophils Absolute 05/14/2018 0.0  0.0 - 0.1 K/uL Final  . Immature Granulocytes 05/14/2018 1  % Final  . Abs Immature Granulocytes 05/14/2018 0.04  0.00 - 0.07 K/uL Final   Performed at [facility], [address] [place], [place] [address]    Assessment:  Jimmy Groleau. is a 77 y.o. male with intrahepatic cholangiocarcinoma s/p ultrasound guided biopsy on 02/09/2018.  Pathology revealed adenocarcinoma.  Cytokeratin 7 (+), cytokeratin 20 (+), HepPar-1 (-), CD10 (-).  Differential diagnosis based on IHC staining includes primary intrahepatic cholangiocarcinoma and metastatic adenocarcinoma from gastric or pancreaticobiliary primary.   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  Pancreatobiliary 5% (cholangiocacinoma, gallbladder adenocarcinoma, pancreatic adenocarcinoma) cannot be excluded.  CA19-9 was 253 (0-35).  CEA was 8.3.  AFP was 13.4.  Normal studies included:  hepatitis B core antibody total, hepatitis C antibody.  MRI abdomen without contrast on 01/18/2018 revealed significant enlargement of a lateral segment left liver lobe lesion (4.1 x 3.2 cm). There was new adjacent 3.6 x 3.3 cm gastrohepatic ligament adenopathy.  Differential considerations include hepatocellular carcinoma or metastatic disease from an unknown primary. There were smaller bilateral liver lesions which are also suspicious for hepatocellular carcinomas or metastatic disease.  PET scan on 01/28/2018 revealed hypermetabolism associated with a large ill-defined left hepatic lobe mass (SUV 7.0) as well as periportal/gastrohepatic ligament adenopathy (up to 1.5 cm: SUV 4.3).  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  There was aortic atherosclerosis, three-vessel coronary artery  calcification and bilateral renal stones.  He is day 8 of cycle #1 cisplatin and gemcitabine (05/07/2018).  He tolerated day 1 chemotherapy well.  He has iron deficiency anemia.  Labs on 01/22/2018 revealed a ferritin 12 and an iron saturation of 9%. B12 and folate were normal.  Labs on 11/26/2017 revealed a normal creatinine and TSH.  Diet appears good.   EGD at [facility] on 04/17/2017 revealed diffuse atrophic mucosa in the entire stomach and multiple localized erosions without  bleeding.  Pathology revealed foveolar hyperplasia c/w reactive gastropathy.  Colonoscopy at [facility] on 04/17/2017 revealed two 5-7 mm polyps in the descending colon and transverse colon (adenomatous polyps).  Capsule study on 05/05/2017 revealed polyps x 2 in the proximal small bowel and AVMs x 2 in the terminal ileum and proximal small bowel.  Plan was anterograde and retrograde balloon enteroscopy at [facility].  Code status is DNI.  Symptomatically, he denies any side effects associated with chemotherapy.  He feels "fine".  Exam is stable.  Platelet count is 103,000.  Plan: 1. Labs today:  CBC with diff, CMP, Mg. 2. Intrahepatic cholangiocarcinoma:  Patient tolerated Day 1 of cycle #1 chemotherapy well.  Labs reviewed.  Adequate for treatment with slight adjustment in gemcitabine dosing.  Day 8 of cycle #1 gemcitabine + cisplatin today.  Patient has an excellent WBC (8200 with an [place] of 7100).  Patient has been preauthorized for On-Pro Neulasta.  Hold growth factor support with cycle #1 as felt not needed and would contribute to thrombocytopenia.  Check counts next week.  3. Mild thrombocytopenia: Gemcitabine dose reduction (75% of dose). Discussed with pharmacy. 4. Pain symptom management:  Pain is well controlled.  Continue to monitor. 5. Fluids, electrolytes and nutrition:  No new weight today.  Review importance of caloric intake.  Patient previously considered for Megace (200 mg a day) or Marinol 2.5 mg BID (before  lunch and dinner).  Patient has a dry mouth.   Discuss importance of fluid intake.  Patient required additional IVF today for good urine output prior to cisplatin. 6.   RTC in 1 week for NP assessment and labs (CBC with diff, BMP, Mg). 7.   RTC in 2 weeks for MD assessment, labs (CBC with diff, CMP, Mg, CA19-9, AFP), and day 1 of cycle #2 cisplatin and gemcitabine.   Lequita Asal, MD  05/14/2018, 8:55 AM

## 2018-05-20 ENCOUNTER — Inpatient Hospital Stay: Payer: No Typology Code available for payment source | Attending: Hospice and Palliative Medicine

## 2018-05-20 DIAGNOSIS — E1140 Type 2 diabetes mellitus with diabetic neuropathy, unspecified: Secondary | ICD-10-CM | POA: Insufficient documentation

## 2018-05-20 DIAGNOSIS — R630 Anorexia: Secondary | ICD-10-CM | POA: Insufficient documentation

## 2018-05-20 DIAGNOSIS — J449 Chronic obstructive pulmonary disease, unspecified: Secondary | ICD-10-CM | POA: Insufficient documentation

## 2018-05-20 DIAGNOSIS — I252 Old myocardial infarction: Secondary | ICD-10-CM | POA: Insufficient documentation

## 2018-05-20 DIAGNOSIS — C221 Intrahepatic bile duct carcinoma: Secondary | ICD-10-CM | POA: Insufficient documentation

## 2018-05-20 DIAGNOSIS — N189 Chronic kidney disease, unspecified: Secondary | ICD-10-CM | POA: Insufficient documentation

## 2018-05-20 DIAGNOSIS — M1990 Unspecified osteoarthritis, unspecified site: Secondary | ICD-10-CM | POA: Insufficient documentation

## 2018-05-20 DIAGNOSIS — E1122 Type 2 diabetes mellitus with diabetic chronic kidney disease: Secondary | ICD-10-CM | POA: Insufficient documentation

## 2018-05-20 DIAGNOSIS — E860 Dehydration: Secondary | ICD-10-CM | POA: Insufficient documentation

## 2018-05-20 DIAGNOSIS — F329 Major depressive disorder, single episode, unspecified: Secondary | ICD-10-CM | POA: Insufficient documentation

## 2018-05-20 DIAGNOSIS — G20 Parkinson's disease: Secondary | ICD-10-CM | POA: Insufficient documentation

## 2018-05-20 DIAGNOSIS — E875 Hyperkalemia: Secondary | ICD-10-CM | POA: Insufficient documentation

## 2018-05-20 DIAGNOSIS — E861 Hypovolemia: Secondary | ICD-10-CM | POA: Insufficient documentation

## 2018-05-20 DIAGNOSIS — I9589 Other hypotension: Secondary | ICD-10-CM | POA: Insufficient documentation

## 2018-05-20 DIAGNOSIS — I509 Heart failure, unspecified: Secondary | ICD-10-CM | POA: Insufficient documentation

## 2018-05-20 DIAGNOSIS — E7800 Pure hypercholesterolemia, unspecified: Secondary | ICD-10-CM | POA: Insufficient documentation

## 2018-05-20 DIAGNOSIS — K219 Gastro-esophageal reflux disease without esophagitis: Secondary | ICD-10-CM | POA: Insufficient documentation

## 2018-05-20 DIAGNOSIS — G893 Neoplasm related pain (acute) (chronic): Secondary | ICD-10-CM | POA: Insufficient documentation

## 2018-05-20 DIAGNOSIS — I4891 Unspecified atrial fibrillation: Secondary | ICD-10-CM | POA: Insufficient documentation

## 2018-05-20 NOTE — Progress Notes (Signed)
Nutrition Assessment   Reason for Assessment:   Referral from Dr. Mike Gip, weight loss, poor appetite   ASSESSMENT:  78 year old male with intrahepatic cholangiocarcinoma.  Patient receiving cisplatin and gemcitabine.  Past medical history of CKD stage 3, COPD, CAD, DM, PD, PVD. Note patient followed by [facility]ACE  Met with patient today in clinic.  Patient reports no appetite.  Reports trouble swallowing over the last 4 days with very little intake due to thrush.  Reports that Dr. Meredith Staggers is aware and he has swish that he is using.  Reports throat is better today.  Reports that he has aides that help prepare meals for him but he needs more help.  Breakfast this am was few bites of potatoes, 2 pieces of toast dip in his coffee.  Usually does not eat again until 4:30-5 pm.  When asked what he eats "whatever they fix me."  Sometimes he has soup, all vegetables, maybe meat and vegetables sometimes.  Does not drink oral nutrition supplements but has in the past.  Stopped drinking them when started gaining too much weight. Reports at one time lost down to 160 lb. Reports does not like cheese.  Mainly drinks juice and sometimes buttermilk.     Does not check blood glucose per patient.   Medications: marinol added by Dr. Meredith Staggers per patient (12/27).     Labs: reviewed   Anthropometrics:   Height: 68 inches Weight: 183 lb 14.4 oz on (12/20) UBW: 190-200 lb per patient (noted on 01/22/2018 wt of 197 lb BMI: 27  7% weight loss in the last 3 1/2 months, not significant   Estimated Energy Needs  Kcals: 2075-2500 Protein: 103-125 g Fluid: > 2 L/d   NUTRITION DIAGNOSIS: Inadequate oral intake related to cancer and cancer related treatment side effects(poor appetite, thrush) as evidenced by decreased intake and 7% weight loss in the last 3 1/2 months   MALNUTRITION DIAGNOSIS: continue to monitor   INTERVENTION:  Reviewed importance of good nutrition.  Discussed soft, moist foods high in  protein and calories for patient to consume. Handout given. Patient reports he will give this to his aides. Encouraged oral nutrition supplements at 1-2 per day.  Samples given today Agree with trial of appetite stimulant. Contact information given to patient    MONITORING, EVALUATION, GOAL:  Patient will consume adequate calories and protein to maintain weight  Next Visit: Feb 6 for follow-up  Oluwanifemi Petitti B. Zenia Resides, [place], [place] Registered Dietitian [phone] (pager)

## 2018-05-21 ENCOUNTER — Emergency Department: Payer: Medicare (Managed Care)

## 2018-05-21 ENCOUNTER — Other Ambulatory Visit: Payer: Self-pay

## 2018-05-21 ENCOUNTER — Encounter: Payer: Self-pay | Admitting: Emergency Medicine

## 2018-05-21 ENCOUNTER — Emergency Department
Admission: EM | Admit: 2018-05-21 | Discharge: 2018-05-21 | Disposition: A | Discharge status: Home / Self Care | Payer: Medicare (Managed Care) | Attending: Emergency Medicine | Admitting: Emergency Medicine

## 2018-05-21 ENCOUNTER — Inpatient Hospital Stay: Payer: No Typology Code available for payment source

## 2018-05-21 ENCOUNTER — Inpatient Hospital Stay: Payer: No Typology Code available for payment source | Admitting: Urgent Care

## 2018-05-21 ENCOUNTER — Ambulatory Visit: Payer: No Typology Code available for payment source

## 2018-05-21 ENCOUNTER — Encounter: Payer: Self-pay | Admitting: Urgent Care

## 2018-05-21 DIAGNOSIS — W06XXXA Fall from bed, initial encounter: Secondary | ICD-10-CM | POA: Insufficient documentation

## 2018-05-21 DIAGNOSIS — N183 Chronic kidney disease, stage 3 (moderate): Secondary | ICD-10-CM | POA: Diagnosis not present

## 2018-05-21 DIAGNOSIS — Z955 Presence of coronary angioplasty implant and graft: Secondary | ICD-10-CM | POA: Insufficient documentation

## 2018-05-21 DIAGNOSIS — Z794 Long term (current) use of insulin: Secondary | ICD-10-CM | POA: Insufficient documentation

## 2018-05-21 DIAGNOSIS — C221 Intrahepatic bile duct carcinoma: Secondary | ICD-10-CM

## 2018-05-21 DIAGNOSIS — J449 Chronic obstructive pulmonary disease, unspecified: Secondary | ICD-10-CM | POA: Diagnosis not present

## 2018-05-21 DIAGNOSIS — R0781 Pleurodynia: Secondary | ICD-10-CM | POA: Diagnosis present

## 2018-05-21 DIAGNOSIS — Z87891 Personal history of nicotine dependence: Secondary | ICD-10-CM | POA: Diagnosis not present

## 2018-05-21 DIAGNOSIS — I509 Heart failure, unspecified: Secondary | ICD-10-CM | POA: Diagnosis not present

## 2018-05-21 DIAGNOSIS — G20 Parkinson's disease: Secondary | ICD-10-CM | POA: Insufficient documentation

## 2018-05-21 DIAGNOSIS — R531 Weakness: Secondary | ICD-10-CM | POA: Diagnosis not present

## 2018-05-21 DIAGNOSIS — Z79899 Other long term (current) drug therapy: Secondary | ICD-10-CM | POA: Insufficient documentation

## 2018-05-21 DIAGNOSIS — Y998 Other external cause status: Secondary | ICD-10-CM | POA: Insufficient documentation

## 2018-05-21 DIAGNOSIS — I130 Hypertensive heart and chronic kidney disease with heart failure and stage 1 through stage 4 chronic kidney disease, or unspecified chronic kidney disease: Secondary | ICD-10-CM | POA: Insufficient documentation

## 2018-05-21 DIAGNOSIS — Y92013 Bedroom of single-family (private) house as the place of occurrence of the external cause: Secondary | ICD-10-CM | POA: Diagnosis not present

## 2018-05-21 DIAGNOSIS — Y9389 Activity, other specified: Secondary | ICD-10-CM | POA: Insufficient documentation

## 2018-05-21 DIAGNOSIS — I2510 Atherosclerotic heart disease of native coronary artery without angina pectoris: Secondary | ICD-10-CM | POA: Insufficient documentation

## 2018-05-21 DIAGNOSIS — R52 Pain, unspecified: Secondary | ICD-10-CM

## 2018-05-21 DIAGNOSIS — E1122 Type 2 diabetes mellitus with diabetic chronic kidney disease: Secondary | ICD-10-CM | POA: Insufficient documentation

## 2018-05-21 DIAGNOSIS — W19XXXA Unspecified fall, initial encounter: Secondary | ICD-10-CM

## 2018-05-21 LAB — CBC
HCT: 44.6 % (ref 39.0–52.0)
HEMOGLOBIN: 15 g/dL (ref 13.0–17.0)
MCH: 29 pg (ref 26.0–34.0)
MCHC: 33.6 g/dL (ref 30.0–36.0)
MCV: 86.1 fL (ref 80.0–100.0)
Platelets: 41 10*3/uL — ABNORMAL LOW (ref 150–400)
RBC: 5.18 MIL/uL (ref 4.22–5.81)
RDW: 13.1 % (ref 11.5–15.5)
WBC: 8.1 10*3/uL (ref 4.0–10.5)

## 2018-05-21 LAB — BASIC METABOLIC PANEL
ANION GAP: 10 (ref 5–15)
BUN: 21 mg/dL (ref 8–23)
CO2: 26 mmol/L (ref 22–32)
Calcium: 8.7 mg/dL — ABNORMAL LOW (ref 8.9–10.3)
Chloride: 99 mmol/L (ref 98–111)
Creatinine, Ser: 0.83 mg/dL (ref 0.61–1.24)
GFR calc Af Amer: >60 mL/min (ref >60)
GFR calc non Af Amer: >60 mL/min (ref >60)
Glucose, Bld: 151 mg/dL — ABNORMAL HIGH (ref 70–99)
Potassium: 4.4 mmol/L (ref 3.5–5.1)
SODIUM: 135 mmol/L (ref 135–145)

## 2018-05-21 LAB — TROPONIN I: Troponin I: <0.03 ng/mL (ref <0.03)

## 2018-05-21 MED ORDER — SODIUM CHLORIDE 0.9 % IV BOLUS: 500.0000 mL | Freq: Once | INTRAVENOUS | Status: DC

## 2018-05-21 MED ORDER — ACETAMINOPHEN 325 MG PO TABS
650.0000 mg | ORAL_TABLET | Freq: Once | ORAL | Status: AC
  Administered 2018-05-21: 650 mg via ORAL
  Filled 2018-05-21: qty 2

## 2018-05-21 NOTE — ED Notes (Signed)
Patient to Rm 16 via WC.  Kathlee Nations RN in room.

## 2018-05-21 NOTE — Progress Notes (Signed)
Re: Transfer to the ED  Received a call from [place] lab staff to report that patient was not feeling well. Patient reportedly sustained a mechanical fall from bed last night. He presented to clinic today for chemotherapy treatment. Upon entering the lab, patient was noted to have post-fall trauma to his head and arms. He complained of having BILATERAL rib pain and being SOB. He advised staff that he was "seeing black and stars" and was feeling as if he was going to pass out. Patient was taken to the ED for further evaluation. Patient was NOT seen in clinic by this provider.   Additionally, communication sent to [facility] provider (Dr. Leticia Penna) to make him aware of fall and subsequent transfer to the [facility] ED. I called report to ED charge RN Augustin Coupe, RN) with the limited information that was available to me.   Honor Loh, MSN, APRN, FNP-C, CEN Oncology/Hematology Nurse Practitioner  [facility] 05/21/18, 9:08 AM

## 2018-05-21 NOTE — ED Notes (Signed)
First Nurse Note: Patient to ED from [place] in [facility] accompanied by [place] staff who state that patient presented to [facility] this AM complaining of pain from fall last PM.  Patient alert and oriented.

## 2018-05-21 NOTE — ED Provider Notes (Signed)
[facility] Emergency Department Provider Note  ____________________________________________   I have reviewed the triage vital signs and the nursing notes. Where available I have reviewed prior notes and, if possible and indicated, outside hospital notes.    HISTORY  Chief Complaint Fall; Shortness of Breath; and Near Syncope    HPI Jimmy Hill. is a 78 y.o. male at home continues to smoke a COPD on as needed oxygen and wheelchair user because of generalized weakness has significant cancer for which he is getting oral sounds like palliative chemotherapy according to his doctor presents today after a non-syncopal fall.  Rolled out of bed.  Has a slight skin tear to his left arm and has some tenderness to the ribs on the right side no difficulty breathing no other complaints No chest pain no shortness of breath no fever no chills no antecedent illness, and he feels okay now wants to go home  Past Medical History:  Diagnosis Date  . Allergic rhinitis   . Anginal pain ([place])   . Arthritis   . Autonomic dysreflexia   . Back pain    CHRONIC LBP,STENOSIS  . CHF (congestive heart failure) ([place])   . Chronic kidney disease    STAGE 3  . COPD (chronic obstructive pulmonary disease) ([place])   . Coronary artery disease   . Depression   . Diabetes ([place])   . Diabetes mellitus without complication ([place])   . DJD (degenerative joint disease)   . Dysphagia   . Dysrhythmia    A-fib, now converted  . Edema    FEET/ANKLES  . GERD (gastroesophageal reflux disease)   . Glaucoma   . H/O orthostatic hypotension   . Hypercholesterolemia   . Myocardial infarction ([place]) 2000  . Parkinson's disease ([place])   . Peripheral vascular disease ([place])   . Pneumonia    history of   . Polyneuropathy   . RLS (restless legs syndrome)   . Seizures ([place])    when in rehab for his hip  . Sleep apnea   . Smoker   . Spinal stenosis   . Strabismic amblyopia   . Tinnitus      Patient Active Problem List   Diagnosis Date Noted  . Encounter for antineoplastic chemotherapy 04/30/2018  . Dehydration 04/30/2018  . 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 Cancer-related pain 03/18/2018  . Goals of care, counseling/discussion 03/18/2018  . Iron deficiency 03/02/2018  . Liver lesion 01/22/2018  . Acute exacerbation of chronic obstructive pulmonary disease (COPD) ([place]) 09/23/2017  . Sepsis ([place]) 05/01/2017  . Syncope 04/30/2017  . New onset atrial fibrillation ([place]) 07/03/2016  . Loss of weight   . Gastritis without bleeding   . Occult GI bleeding   . GI bleed 03/02/2016  . Left-sided weakness 03/02/2016  . Rectal bleeding 01/16/2016  . Colitis presumed infectious 01/10/2016  . Pressure ulcer 12/29/2015  . Altered mental status 12/28/2015  . Fracture of femoral neck, left ([place]) 12/03/2015  . CAD (coronary artery disease) 12/03/2015  . Sleep apnea 12/03/2015  . Femoral neck fracture, left, closed, initial encounter 12/03/2015  . Unstable angina ([place]) 05/14/2015  . COPD (chronic obstructive pulmonary disease) ([place]) 02/05/2015  . Acute bronchitis 02/05/2015  . Type 2 diabetes mellitus ([place]) 02/05/2015  . Chest pain 12/09/2014  . Presence of drug coated stent in LAD coronary artery 07/11/2013  . Disease characterized by destruction of skeletal muscle 09/12/2009  . Encephalopathy 09/12/2009  . Vitamin D deficiency 05/05/2008  . Polyneuropathy in  diabetes ([place]) 08/17/2005  . Spinal stenosis in cervical region 03/20/2005  . Essential tremor 01/17/2005  . Hypercholesterolemia 03/21/2004  . Hypertension, benign 01/08/2004  . 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 Localized osteoarthrosis, lower leg 04/07/2002  . Parkinson's disease ([place]) 09/21/2000  . Peptic ulcer with hemorrhage and perforation ([place]) 04/19/1996  . 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 ANKLE SURGERY    . APPENDECTOMY     . BACK SURGERY    . CATARACT EXTRACTION W/PHACO Left 08/26/2016   Procedure: CATARACT EXTRACTION PHACO AND INTRAOCULAR LENS PLACEMENT (IOC);  Surgeon: Birder Robson, MD;  Location: [facility] ORS;  Service: Ophthalmology;  Laterality: Left;  [place] 01:37 AP% 19.4 CDE 18.97 fluid pack lot # [id number]  . CORONARY ANGIOPLASTY     STENT  . CTR    . ESOPHAGOGASTRODUODENOSCOPY (EGD) WITH PROPOFOL N/A 03/04/2016   Procedure: ESOPHAGOGASTRODUODENOSCOPY (EGD) WITH PROPOFOL;  Surgeon: [name], MD;  Location: [facility] ENDOSCOPY;  Service: Endoscopy;  Laterality: N/A;  . EYE SURGERY     cataract  . FRACTURE SURGERY    . HIP PINNING,CANNULATED Left 12/04/2015   Procedure: CANNULATED HIP PINNING;  Surgeon: Thornton Park, MD;  Location: [facility] ORS;  Service: Orthopedics;  Laterality: Left;  . PORTA CATH INSERTION N/A 04/28/2018   Procedure: PORTA CATH INSERTION;  Surgeon: Katha Cabal, MD;  Location: [place] [facility];  Service: Cardiovascular;  Laterality: N/A;    Prior to Admission medications   Medication Sig Start Date End Date Taking? Authorizing Provider  acetaminophen (TYLENOL) 325 MG tablet Take 650 mg by mouth every 6 (six) hours as needed.    [provider]  albuterol (PROVENTIL HFA;VENTOLIN HFA) 108 (90 Base) MCG/ACT inhaler Inhale into the lungs every 6 (six) hours as needed for wheezing or shortness of breath.    [provider]  albuterol (PROVENTIL) (2.5 MG/3ML) 0.083% nebulizer solution Take 2.5 mg by nebulization 4 (four) times daily as needed for wheezing or shortness of breath.    [provider]  amiodarone (PACERONE) 100 MG tablet Take 100 mg by mouth daily.    [provider]  atorvastatin (LIPITOR) 80 MG tablet Take 80 mg by mouth at bedtime.    [provider]  brimonidine-timolol (COMBIGAN) 0.2-0.5 % ophthalmic solution Place 1 drop into both eyes 2 (two) times daily.    [provider]  Calcium Carbonate-Vitamin D (CALCIUM  600+D PO) Take 1 tablet by mouth daily.    [provider]  carbidopa-levodopa (SINEMET IR) 25-250 MG tablet Take 2 tablets by mouth every morning (8AM), 1 tablet at noon, 1 tablet every afternoon (430PM) and 1 tablet at bedtime (10PM)    [provider]  Carboxymethylcellul-Glycerin (REFRESH OPTIVE OP) Apply 2 drops to eye every 8 (eight) hours as needed (dry eyes).    [provider]  dexamethasone (DECADRON) 4 MG tablet Take 2 tablets by mouth once a day on the day after chemotherapy and then take 1 tablet two times a day for 2 days. Take with food. 04/27/18   Lequita Asal, MD  Dextromethorphan-Guaifenesin (TUSSIN DM) 10-100 MG/5ML liquid Take 5 mLs by mouth every 6 (six) hours as needed (cough).     [provider]  dextrose (GLUTOSE 15) 40 % GEL Take 1 Tube by mouth. For blood sugar less than 70, repeat in 5 minutes up to 2 doses     [provider]  ferrous sulfate 325 (65  FE) MG tablet Take 325 mg by mouth daily with breakfast.    [provider]  FLUDROCORTISONE ACETATE PO Take 0.1 mg by mouth daily.    [provider]  ibuprofen (ADVIL,MOTRIN) 200 MG tablet Take 400 mg by mouth every 6 (six) hours as needed.    [provider]  insulin glargine (LANTUS) 100 UNIT/ML injection Inject 20 Units into the skin every morning.     Gareth Morgan, MD  isosorbide mononitrate (ISMO,MONOKET) 10 MG tablet Take 5 mg by mouth at bedtime.     [provider]  Lenvatinib 12 mg daily dose (LENVIMA) 3 x 4 MG capsule Take 12 mg by mouth daily. 03/23/18   Karen Kitchens, NP  levETIRAcetam (KEPPRA) 500 MG tablet Take 500 mg by mouth 2 (two) times daily.    [provider]  lidocaine (LIDODERM) 5 % Place 1 patch onto the skin daily. Remove & Discard patch within 12 hours or as directed by MD    [provider]  lidocaine-prilocaine (EMLA) cream Apply to affected area once 04/27/18   Lequita Asal, MD   loperamide (IMODIUM) 2 MG capsule Take by mouth as needed for diarrhea or loose stools.    [provider]  magnesium oxide (MAG-OX) 400 MG tablet Take 400 mg by mouth daily.    [provider]  metFORMIN (GLUCOPHAGE) 1000 MG tablet Take 1,000 mg by mouth 2 (two) times daily with a meal.     Gareth Morgan, MD  metoprolol succinate (TOPROL-XL) 25 MG 24 hr tablet Take 25 mg by mouth daily.    [provider]  mirabegron ER (MYRBETRIQ) 50 MG TB24 tablet Take 50 mg by mouth daily.    [provider]  mometasone-formoterol (DULERA) 100-5 MCG/ACT AERO Inhale 2 puffs into the lungs 2 (two) times daily.    [provider]  naloxone [facility]) nasal spray 4 mg/0.1 mL Place 1 spray into the nose.    [provider]  nitroGLYCERIN (NITROSTAT) 0.4 MG SL tablet Place 0.4 mg under the tongue every 5 (five) minutes as needed for chest pain.    [provider]  ondansetron (ZOFRAN) 8 MG tablet Take 1 tablet (8 mg total) by mouth 2 (two) times daily as needed. Start on the third day after chemotherapy. 04/27/18   Lequita Asal, MD  Oxycodone HCl 10 MG TABS Take 10 mg by mouth 3 (three) times daily as needed.     [provider]  pantoprazole (PROTONIX) 40 MG tablet Take 40 mg by mouth daily.    [provider]  polyethylene glycol (MIRALAX / GLYCOLAX) packet Take 17 g by mouth daily.    [provider]  potassium chloride (K-DUR,KLOR-CON) 10 MEQ tablet Take 40 mEq by mouth daily.     [provider]  pregabalin (LYRICA) 50 MG capsule Take 100 mg by mouth every evening.  01/26/17 05/14/18  [provider]  prochlorperazine (COMPAZINE) 10 MG tablet Take 1 tablet (10 mg total) by mouth every 6 (six) hours as needed (Nausea or vomiting). 04/27/18   Lequita Asal, MD  senna-docusate (SENOKOT-S) 8.6-50 MG tablet Take 2 tablets by mouth at bedtime as needed.     [provider]  traMADol  (ULTRAM) 50 MG tablet Take 50 mg by mouth every 6 (six) hours as needed for moderate pain.    [provider]  vitamin B-12 (CYANOCOBALAMIN) 1000 MCG tablet Take 1,000 mcg by mouth daily.  [provider]  Vitamins A & D (VITAMIN A & D) ointment Apply 1 application topically as needed for dry skin.    [provider]    Allergies Bupropion; Bactrim [sulfamethoxazole-trimethoprim]; and Cheese  Family History  Problem Relation Age of Onset  . Heart disease Father   . Diabetes Mellitus II Other     Social History Social History   Tobacco Use  . Smoking status: Former Smoker    Packs/day: 0.50    Types: Cigarettes    Last attempt to quit: 11/17/2015    Years since quitting: 2.5  . Smokeless tobacco: Never Used  Substance Use Topics  . Alcohol use: No  . Drug use: No    Review of Systems Constitutional: No fever/chills Eyes: No visual changes. ENT: No sore throat. No stiff neck no neck pain Cardiovascular: Denies cardiopulmonary symptoms does have chest wall pain Respiratory: Denies shortness of breath. Gastrointestinal:   no vomiting.  No diarrhea.  No constipation. Genitourinary: Negative for dysuria. Musculoskeletal: Negative lower extremity swelling Skin: Negative for rash. Neurological: Negative for severe headaches, focal weakness or numbness.   ____________________________________________   PHYSICAL EXAM:  VITAL SIGNS: ED Triage Vitals  Enc Vitals Group     BP 05/21/18 0915 102/66     Pulse Rate 05/21/18 0915 85     Resp 05/21/18 0915 20     Temp 05/21/18 0915 97.6 F (36.4 C)     Temp Source 05/21/18 0915 Oral     SpO2 05/21/18 0915 95 %     Weight 05/21/18 0918 183 lb (83 kg)     Height 05/21/18 0918 5\' 8"  (1.727 m)     Head Circumference --      Peak Flow --      Pain Score 05/21/18 0918 8     Pain Loc --      Pain Edu? --      Excl. in [place]? --     Constitutional: Alert and oriented.  Ill-appearing but not toxic Eyes:  Conjunctivae are normal Head: Atraumatic HEENT: No congestion/rhinnorhea. Mucous membranes are moist.  Oropharynx non-erythematous Neck:   Nontender with no meningismus, no masses, no stridor Cardiovascular: Normal rate, regular rhythm. Grossly normal heart sounds.  Good peripheral circulation. Respiratory: Normal respiratory effort.  No retractions. Lungs CTAB. Chest: Tender palpation the right chest wall in the chest is very patient states "ouch that the pain right there", no crepitus no flail chest, no pain to the liver, there is no shingles noted no bruising Abdominal: Soft and nontender. No distention. No guarding no rebound Back:  There is no focal tenderness or step off.  there is no midline tenderness there are no lesions noted. there is no CVA tenderness Musculoskeletal: No lower extremity tenderness, no upper extremity tenderness. No joint effusions, no DVT signs strong distal pulses no edema Neurologic:  Normal speech and language. No gross focal neurologic deficits are appreciated.  Skin:  Skin is warm, dry and intact.  There is a slight skin tear to the left elbow. Psychiatric: Mood and affect are normal. Speech and behavior are normal.  ____________________________________________   LABS (all labs ordered are listed, but only abnormal results are displayed)  Labs Reviewed  BASIC METABOLIC PANEL - Abnormal; Notable for the following components:      Result Value   Glucose, Bld 151 (*)    Calcium 8.7 (*)    All other components within normal limits  CBC - Abnormal; Notable for the following components:  Platelets 41 (*)    All other components within normal limits  TROPONIN I    Pertinent labs  results that were available during my care of the patient were reviewed by me and considered in my medical decision making (see chart for details). ____________________________________________  EKG  I personally interpreted any EKGs ordered by me or triage Sinus rhythm first  AV block rate 80 bpm normal axis no acute ST elevation depression nonspecific ST changes ____________________________________________  RADIOLOGY  Pertinent labs & imaging results that were available during my care of the patient were reviewed by me and considered in my medical decision making (see chart for details). If possible, patient and/or family made aware of any abnormal findings.  Dg Chest 2 View  Result Date: 05/21/2018 CLINICAL DATA:  SOB EXAM: CHEST - 2 VIEW COMPARISON:  PET-CT 01/28/2018.  Chest x-ray 10/13/2017. FINDINGS: PowerPort catheter with tip over superior vena cava. Heart size normal. Mild left base subsegmental atelectasis. No pleural effusion or pneumothorax. Prior lumbar spine fusion. IMPRESSION: 1.  PowerPort catheter with tip over cavoatrial junction. 2.  Mild left base subsegmental atelectasis. Electronically Signed   By: Marcello Moores  Register   On: 05/21/2018 10:10   Dg Ribs Unilateral Left  Result Date: 05/21/2018 CLINICAL DATA:  78 year old male with a history of shortness of breath after a fall EXAM: LEFT RIBS - 2 VIEW COMPARISON:  None. FINDINGS: No fracture or other bone lesions are seen involving the ribs. Right-sided IJ port catheter. IMPRESSION: No acute displaced rib fracture. Electronically Signed   By: Corrie Mckusick D.O.   On: 05/21/2018 09:59   Dg Ribs Unilateral Right  Result Date: 05/21/2018 CLINICAL DATA:  Pt reports shortness of breath following a fall around midnight last night. Pt states that he has bilateral rib pain "all over" and a general soreness in his ribs. EXAM: RIGHT RIBS - 2 VIEW COMPARISON:  None. FINDINGS: No fracture or other bone lesions are seen involving the ribs. No pneumothorax. Stable right IJ power injectable for catheter. Degenerative spurring at the acromioclavicular joint. Lumbar fixation hardware partially visualized. IMPRESSION: Negative. Electronically Signed   By: Lucrezia Europe M.D.   On: 05/21/2018 10:01   Ct Head Wo Contrast  Result  Date: 05/21/2018 CLINICAL DATA:  fall this morning, unsure of how he fell out of bed but hit his head on the floor, denies LOC, denies blood thinners. States hx of liver cancer and was at cancer center for blood work. Pt also c/o SOB. EXAM: CT HEAD WITHOUT CONTRAST TECHNIQUE: Contiguous axial images were obtained from the base of the skull through the vertex without intravenous contrast. COMPARISON:  12/16/2017 FINDINGS: Brain: Diffuse parenchymal atrophy. Patchy areas of hypoattenuation in deep and periventricular white matter bilaterally. Negative for acute intracranial hemorrhage, mass lesion, acute infarction, midline shift, or mass-effect. Acute infarct may be inapparent on noncontrast CT. Ventricles and sulci symmetric. Vascular: Atherosclerotic and physiologic intracranial calcifications. Skull: Normal. Negative for fracture or focal lesion. Sinuses/Orbits: No acute finding. Other: None IMPRESSION: 1. Negative for bleed or other acute intracranial process. 2. Atrophy and nonspecific white matter changes. Electronically Signed   By: Lucrezia Europe M.D.   On: 05/21/2018 10:03   ____________________________________________    PROCEDURES  Procedure(s) performed: None  Procedures  Critical Care performed: None  ____________________________________________   INITIAL IMPRESSION / ASSESSMENT AND PLAN / ED COURSE  Pertinent labs & imaging results that were available during my care of the patient were reviewed by me and considered in my  medical decision making (see chart for details).  Took a tumble today out of his bed, he is at his baseline, the [facility] coordinators are at bedside they know him well.  I have also talked personally to his personal physician who will see him at home later today they do not want liver function test they do not want coags they do not feel any further work-up is indicated.  They are aware of his platelets.  Patient is at his baseline according to them and according to patient  in all respects and what they would like him to go home and they will see him today at his house.  I think this is an excellent disposition for this patient he is strongly in accordance with this we will discharge him.  There is no evidence of flail chest there is no evidence of intra-abdominal jury, there is no evidence of significant head injury, he is alert and at baseline CT is negative, with low platelets want to make an argument for admitting and observing him, but he will be observed at home and that is a strong preference of his provider and the patient.  There is no evidence of pneumothorax, rib fracture, or significant head injury at this time we will discharge him with close outpatient follow-up today in fact at his house    ____________________________________________   FINAL CLINICAL IMPRESSION(S) / ED DIAGNOSES  Final diagnoses:  Fall, initial encounter  Rib pain      This chart was dictated using voice recognition software.  Despite best efforts to proofread,  errors can occur which can change meaning.      Schuyler Amor, MD 05/21/18 [phone]

## 2018-05-21 NOTE — ED Notes (Signed)
Pt reporting bilateral rib pains after fall headache also reported. Pt denies having lost consciousness. Pt alert and oriented x 4.

## 2018-05-21 NOTE — ED Triage Notes (Signed)
Pt to ED from home c/o fall this morning, unsure of how he fell out of bed but hit his head on the floor, denies LOC, denies blood thinners.  States hx of liver cancer and was at cancer center for blood work.  Pt also c/o SOB.  Pt brought in by [facility], lives at home.

## 2018-05-27 NOTE — Progress Notes (Signed)
[facility] day:  05/28/2018   Chief Complaint: Jimmy Hill. is a 78 y.o. 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  HPI:  The patient was last seen in the medical oncology clinic on 05/14/2018.  At that time, he denied any side effects associated with chemotherapy.  He felt "fine".  Exam was stable.  Platelet count was 103,000.  He received day 8 chemotherapy.  Gemcitabine was given at 75% dosing secondary to thrombocytopenia.  He was seen in the [facility] ER on 04/21/2019 after a fall out of bed.  Notes reviewed.  He presented with a skin tear to his left arm and right rib tenderness. Rib films revealed no fracture.  CXR was negative.  Head CT was negative.  CBC revealed a hematocrit of 44.6, hemoglobin 15.0, platelets 41,000 and WBC 8100.  Creatinine was 0.83.  Potassium was 4.4.  During the interim, patient presents today with some confusion. Initially did not know his name or date of birth. He has some minor vertigo symptoms. His blood pressure was low at 86/54. Patient denies any nausea, vomiting, and changes to his bowel habits. Appetite has been poor. Patient notes that he just does not want to eat. Weight today is 180 lb 9.6 oz (81.9 kg), which compared to his last visit to the clinic, represents 3 pound decrease. He did not eat or drink anything.  He states, "the way that I am hurting (back), I just don't want to eat".   He has not taken his pain medication as he "doesn't want to get hooked".   Past Medical History:  Diagnosis Date  . Allergic rhinitis   . Anginal pain ([place])   . Arthritis   . Autonomic dysreflexia   . Back pain    CHRONIC LBP,STENOSIS  . CHF (congestive heart failure) ([place])   . Chronic kidney disease    STAGE 3  . COPD (chronic obstructive pulmonary disease) ([place])   . Coronary artery disease   . Depression   . Diabetes ([place])   .  Diabetes mellitus without complication ([place])   . DJD (degenerative joint disease)   . Dysphagia   . Dysrhythmia    A-fib, now converted  . Edema    FEET/ANKLES  . GERD (gastroesophageal reflux disease)   . Glaucoma   . H/O orthostatic hypotension   . Hypercholesterolemia   . Myocardial infarction ([place]) 2000  . Parkinson's disease ([place])   . Peripheral vascular disease ([place])   . Pneumonia    history of   . Polyneuropathy   . RLS (restless legs syndrome)   . Seizures ([place])    when in rehab for his hip  . Sleep apnea   . Smoker   . Spinal stenosis   . Strabismic amblyopia   . Tinnitus     Past Surgical History:  Procedure Laterality Date  . ANKLE SURGERY    . APPENDECTOMY    . BACK SURGERY    . CATARACT EXTRACTION W/PHACO Left 08/26/2016   Procedure: CATARACT EXTRACTION PHACO AND INTRAOCULAR LENS PLACEMENT (IOC);  Surgeon: Birder Robson, MD;  Location: [facility] ORS;  Service: Ophthalmology;  Laterality: Left;  [place] 01:37 AP% 19.4 CDE 18.97 fluid pack lot # [id number]  . CORONARY ANGIOPLASTY     STENT  . CTR    . ESOPHAGOGASTRODUODENOSCOPY (EGD) WITH PROPOFOL N/A 03/04/2016   Procedure: ESOPHAGOGASTRODUODENOSCOPY (EGD) WITH PROPOFOL;  Surgeon: [name], MD;  Location: [facility] ENDOSCOPY;  Service: Endoscopy;  Laterality: N/A;  . EYE SURGERY     cataract  . FRACTURE SURGERY    . HIP PINNING,CANNULATED Left 12/04/2015   Procedure: CANNULATED HIP PINNING;  Surgeon: Thornton Park, MD;  Location: [facility] ORS;  Service: Orthopedics;  Laterality: Left;  . PORTA CATH INSERTION N/A 04/28/2018   Procedure: PORTA CATH INSERTION;  Surgeon: Katha Cabal, MD;  Location: [place] CV LAB;  Service: Cardiovascular;  Laterality: N/A;    Family History  Problem Relation Age of Onset  . Heart disease Father   . Diabetes Mellitus II Other     Social History:  reports that he quit smoking about 2 years ago. His smoking use included cigarettes. He smoked 0.50 packs per day. He has  never used smokeless tobacco. He reports that he does not drink alcohol or use drugs.  He smoked 2 packs/week x 60 years.  He is unaware of any exposures to radiation or toxins.  He is a retired Production designer, theatre/television/film.  He lives alone.  He has a wheelchair at his house (Parkinson's, diabetes with neuropathy).  He is able to perform is ADLs.  He has aides come out during the day.  His wife has dementia and lives in the [facility].  Daughter is [name]. His dog, Peanut, died in 06/12/2018.  He is considering getting a new dog.  The patient is alone today.   Allergies:  Allergies  Allergen Reactions  . Bupropion Other (See Comments)    Seizure  . Bactrim [Sulfamethoxazole-Trimethoprim] Other (See Comments)  . Cheese     Patient doesn't want any cheese    Current Medications: Current Outpatient Medications  Medication Sig Dispense Refill  . acetaminophen (TYLENOL) 325 MG tablet Take 650 mg by mouth every 6 (six) hours as needed.    Marland Kitchen albuterol (PROVENTIL HFA;VENTOLIN HFA) 108 (90 Base) MCG/ACT inhaler Inhale into the lungs every 6 (six) hours as needed for wheezing or shortness of breath.    Marland Kitchen albuterol (PROVENTIL) (2.5 MG/3ML) 0.083% nebulizer solution Take 2.5 mg by nebulization 4 (four) times daily as needed for wheezing or shortness of breath.    Marland Kitchen amiodarone (PACERONE) 100 MG tablet Take 100 mg by mouth daily.    Marland Kitchen atorvastatin (LIPITOR) 80 MG tablet Take 80 mg by mouth at bedtime.    . brimonidine-timolol (COMBIGAN) 0.2-0.5 % ophthalmic solution Place 1 drop into both eyes 2 (two) times daily.    . Calcium Carbonate-Vitamin D (CALCIUM 600+D PO) Take 1 tablet by mouth daily.    . carbidopa-levodopa (SINEMET IR) 25-250 MG tablet Take 2 tablets by mouth every morning (8AM), 1 tablet at noon, 1 tablet every afternoon (430PM) and 1 tablet at bedtime (10PM)    . Carboxymethylcellul-Glycerin (REFRESH OPTIVE OP) Apply 2 drops to eye every 8 (eight) hours as needed (dry eyes).    Marland Kitchen dexamethasone  (DECADRON) 4 MG tablet Take 2 tablets by mouth once a day on the day after chemotherapy and then take 1 tablet two times a day for 2 days. Take with food. 30 tablet 1  . Dextromethorphan-Guaifenesin (TUSSIN DM) 10-100 MG/5ML liquid Take 5 mLs by mouth every 6 (six) hours as needed (cough).     Marland Kitchen dextrose (GLUTOSE 15) 40 % GEL Take 1 Tube by mouth. For blood sugar less than 70, repeat in 5 minutes up to 2 doses     . ferrous sulfate 325 (65 FE) MG tablet  Take 325 mg by mouth daily with breakfast.    . FLUDROCORTISONE ACETATE PO Take 0.1 mg by mouth daily.    Marland Kitchen ibuprofen (ADVIL,MOTRIN) 200 MG tablet Take 400 mg by mouth every 6 (six) hours as needed.    . insulin glargine (LANTUS) 100 UNIT/ML injection Inject 20 Units into the skin every morning.     . isosorbide mononitrate (ISMO,MONOKET) 10 MG tablet Take 5 mg by mouth at bedtime.     . Lenvatinib 12 mg daily dose (LENVIMA) 3 x 4 MG capsule Take 12 mg by mouth daily. 90 capsule 0  . levETIRAcetam (KEPPRA) 500 MG tablet Take 500 mg by mouth 2 (two) times daily.    Marland Kitchen lidocaine (LIDODERM) 5 % Place 1 patch onto the skin daily. Remove & Discard patch within 12 hours or as directed by MD    . lidocaine-prilocaine (EMLA) cream Apply to affected area once 30 g 3  . loperamide (IMODIUM) 2 MG capsule Take by mouth as needed for diarrhea or loose stools.    . magnesium oxide (MAG-OX) 400 MG tablet Take 400 mg by mouth daily.    . metFORMIN (GLUCOPHAGE) 1000 MG tablet Take 1,000 mg by mouth 2 (two) times daily with a meal.     . metoprolol succinate (TOPROL-XL) 25 MG 24 hr tablet Take 25 mg by mouth daily.    . mirabegron ER (MYRBETRIQ) 50 MG TB24 tablet Take 50 mg by mouth daily.    . mometasone-formoterol (DULERA) 100-5 MCG/ACT AERO Inhale 2 puffs into the lungs 2 (two) times daily.    . naloxone (NARCAN) nasal spray 4 mg/0.1 mL Place 1 spray into the nose.    . nitroGLYCERIN (NITROSTAT) 0.4 MG SL tablet Place 0.4 mg under the tongue every 5 (five) minutes  as needed for chest pain.    Marland Kitchen ondansetron (ZOFRAN) 8 MG tablet Take 1 tablet (8 mg total) by mouth 2 (two) times daily as needed. Start on the third day after chemotherapy. 30 tablet 1  . Oxycodone HCl 10 MG TABS Take 10 mg by mouth 3 (three) times daily as needed.     . pantoprazole (PROTONIX) 40 MG tablet Take 40 mg by mouth daily.    . polyethylene glycol (MIRALAX / GLYCOLAX) packet Take 17 g by mouth daily.    . potassium chloride (K-DUR,KLOR-CON) 10 MEQ tablet Take 40 mEq by mouth daily.     . prochlorperazine (COMPAZINE) 10 MG tablet Take 1 tablet (10 mg total) by mouth every 6 (six) hours as needed (Nausea or vomiting). 30 tablet 1  . senna-docusate (SENOKOT-S) 8.6-50 MG tablet Take 2 tablets by mouth at bedtime as needed.     . traMADol (ULTRAM) 50 MG tablet Take 50 mg by mouth every 6 (six) hours as needed for moderate pain.    . vitamin B-12 (CYANOCOBALAMIN) 1000 MCG tablet Take 1,000 mcg by mouth daily.    . Vitamins A & D (VITAMIN A & D) ointment Apply 1 application topically as needed for dry skin.     No current facility-administered medications for this visit.    Facility-Administered Medications Ordered in Other Visits  Medication Dose Route Frequency Provider Last Rate Last Dose  . heparin lock flush 100 unit/mL  500 Units Intravenous Once Corcoran, Melissa C, MD      . sodium chloride flush (NS) 0.9 % injection 10 mL  10 mL Intravenous PRN Lequita Asal, MD   10 mL at 05/28/18 0830    Review  of Systems  Constitutional: Positive for malaise/fatigue and weight loss (3 pounds). Negative for chills, diaphoresis and fever.  HENT: Negative.  Negative for congestion, ear discharge, ear pain, nosebleeds, sinus pain and sore throat.   Eyes: Negative for double vision, photophobia, pain and redness.       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   Respiratory: Positive for cough (chronic) and shortness of breath (exertional). Negative for hemoptysis,  sputum production and wheezing.        Requires supplemental oxygen ATC.  Cardiovascular: Negative for chest pain, palpitations, orthopnea, leg swelling and PND.       H/o prolonged QTc.  Gastrointestinal: Negative.  Negative for abdominal pain, blood in stool, constipation, diarrhea, melena, nausea and vomiting.       RUQ discomfort, improved.  Did not eat yesterday secondary to back pain.  Genitourinary: Negative.  Negative for dysuria, frequency, hematuria and urgency.       Poor fluid intake yesterday.  Musculoskeletal: Positive for back pain and joint pain. Negative for falls, myalgias and neck pain.  Skin: Negative.  Negative for itching and rash.  Neurological: Positive for sensory change (neuropathy in hands and feet). Negative for dizziness, tremors, speech change, focal weakness, weakness and headaches.  Endo/Heme/Allergies: Does not bruise/bleed easily.       Diabetes.  Psychiatric/Behavioral: Negative.  Negative for depression and memory loss. The patient is not nervous/anxious and does not have insomnia.   All other systems reviewed and are negative.  Performance status (ECOG): 2  Vital Signs: BP (!) 86/54 (BP Location: Right Arm, Patient Position: Sitting)   Pulse 98   Temp 98.1 F (36.7 C) (Oral)   Resp 12   Ht 5\' 8"  (1.727 m)   Wt 180 lb 9.6 oz (81.9 kg)   BMI 27.46 kg/m   Physical Exam  Constitutional: He is oriented to person, place, and time. No distress.  Chronically fatigued appearing gentleman sitting comfortably in the exam room in no acute distress.  HENT:  Head: Normocephalic and atraumatic.  Mouth/Throat: Mucous membranes are normal. No oropharyngeal exudate.  Gray hair.  [place] in place.  Mouth dry.  Eyes: Pupils are equal, round, and reactive to light. Conjunctivae and EOM are normal. No scleral icterus.  Blue eyes.  Neck: Normal range of motion. Neck supple. No JVD present.  Cardiovascular: Normal rate, regular rhythm and normal heart sounds. Exam  reveals no gallop and no friction rub.  No murmur heard. Pulmonary/Chest: Effort normal and breath sounds normal. No respiratory distress. He has no wheezes. He has no rales.  Supplemental oxygen in place via [place]  Abdominal: Soft. Bowel sounds are normal. He exhibits no distension and no mass. There is no abdominal tenderness. There is no rebound and no guarding.  Liver edge palpable.  Musculoskeletal: Normal range of motion.        General: No tenderness or edema.  Lymphadenopathy:    He has no cervical adenopathy.  Neurological: He is alert and oriented to person, place, and time. He has intact cranial nerves. He is not agitated. He displays no weakness, no atrophy, no tremor, facial symmetry and normal speech. No cranial nerve deficit or sensory deficit. He exhibits normal muscle tone. Coordination normal.  President- Humph[place].  Difficulty counting backward by 3s (91, 98, 95, 92, 99).  Skin: Skin is warm and dry. No rash noted. He is not diaphoretic. No erythema. No pallor.  Psychiatric: Mood, affect and judgment normal.  Nursing note and vitals reviewed.   Infusion on 05/28/2018  Component Date Value Ref Range Status  . Magnesium 05/28/2018 1.3* 1.7 - 2.4 mg/dL Final   Performed at [facility], [address] [place], [place] [address]  . Sodium 05/28/2018 135  135 - 145 mmol/L Final  . Potassium 05/28/2018 5.3* 3.5 - 5.1 mmol/L Final  . Chloride 05/28/2018 96* 98 - 111 mmol/L Final  . CO2 05/28/2018 22  22 - 32 mmol/L Final  . Glucose, Bld 05/28/2018 319* 70 - 99 mg/dL Final  . BUN 05/28/2018 34* 8 - 23 mg/dL Final  . Creatinine, Ser 05/28/2018 1.39* 0.61 - 1.24 mg/dL Final  . Calcium 05/28/2018 8.3* 8.9 - 10.3 mg/dL Final  . Total Protein 05/28/2018 5.9* 6.5 - 8.1 g/dL Final  . Albumin 05/28/2018 2.8* 3.5 - 5.0 g/dL Final  . AST 05/28/2018 30  15 - 41 U/L Final  . ALT 05/28/2018 21  0 - 44 U/L Final  . Alkaline Phosphatase 05/28/2018 104  38 - 126 U/L Final  . Total  Bilirubin 05/28/2018 0.6  0.3 - 1.2 mg/dL Final  . GFR calc non Af Amer 05/28/2018 48* >60 mL/min Final  . GFR calc Af Amer 05/28/2018 56* >60 mL/min Final  . Anion gap 05/28/2018 17* 5 - 15 Final   Performed at [facility], [address], [place], [place] [address]  . WBC 05/28/2018 6.9  4.0 - 10.5 K/uL Final  . RBC 05/28/2018 4.71  4.22 - 5.81 MIL/uL Final  . Hemoglobin 05/28/2018 13.6  13.0 - 17.0 g/dL Final  . HCT 05/28/2018 41.8  39.0 - 52.0 % Final  . MCV 05/28/2018 88.7  80.0 - 100.0 fL Final  . MCH 05/28/2018 28.9  26.0 - 34.0 pg Final  . MCHC 05/28/2018 32.5  30.0 - 36.0 g/dL Final  . RDW 05/28/2018 13.6  11.5 - 15.5 % Final  . Platelets 05/28/2018 245  150 - 400 K/uL Final  . nRBC 05/28/2018 0.3* 0.0 - 0.2 % Final   Performed at [facility], [address], [place], [place] [address]  . Neutrophils Relative % 05/28/2018 PENDING  % Incomplete  . Neutro Abs 05/28/2018 PENDING  1.7 - 7.7 K/uL Incomplete  . Band Neutrophils 05/28/2018 PENDING  % Incomplete  . Lymphocytes Relative 05/28/2018 PENDING  % Incomplete  . Lymphs Abs 05/28/2018 PENDING  0.7 - 4.0 K/uL Incomplete  . Monocytes Relative 05/28/2018 PENDING  % Incomplete  . Monocytes Absolute 05/28/2018 PENDING  0.1 - 1.0 K/uL Incomplete  . Eosinophils Relative 05/28/2018 PENDING  % Incomplete  . Eosinophils Absolute 05/28/2018 PENDING  0.0 - 0.5 K/uL Incomplete  . Basophils Relative 05/28/2018 PENDING  % Incomplete  . Basophils Absolute 05/28/2018 PENDING  0.0 - 0.1 K/uL Incomplete  . WBC Morphology 05/28/2018 PENDING   Incomplete  . RBC Morphology 05/28/2018 PENDING   Incomplete  . Smear Review 05/28/2018 PENDING   Incomplete  . Other 05/28/2018 PENDING  % Incomplete  . nRBC 05/28/2018 PENDING  0 /100 WBC Incomplete  . Metamyelocytes Relative 05/28/2018 PENDING  % Incomplete  . Myelocytes 05/28/2018 PENDING  % Incomplete  . Promyelocytes Relative 05/28/2018 PENDING  % Incomplete  . Blasts 05/28/2018  PENDING  % Incomplete    Assessment:  Obediah Welles. is a 78 y.o. male with intrahepatic cholangiocarcinoma s/p ultrasound guided biopsy on 02/09/2018.  Pathology revealed adenocarcinoma.  Cytokeratin 7 (+), cytokeratin 20 (+), HepPar-1 (-), CD10 (-).  Differential diagnosis based  on IHC staining includes primary intrahepatic cholangiocarcinoma and metastatic adenocarcinoma from gastric or pancreaticobiliary primary.   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  Pancreatobiliary 5% (cholangiocacinoma, gallbladder adenocarcinoma, pancreatic adenocarcinoma) cannot be excluded.  CA19-9 was 253 (0-35).  CEA was 8.3.  AFP was 13.4.  Normal studies included:  hepatitis B core antibody total, hepatitis C antibody.  MRI abdomen without contrast on 01/18/2018 revealed significant enlargement of a lateral segment left liver lobe lesion (4.1 x 3.2 cm). There was new adjacent 3.6 x 3.3 cm gastrohepatic ligament adenopathy.  Differential considerations include hepatocellular carcinoma or metastatic disease from an unknown primary. There were smaller bilateral liver lesions which are also suspicious for hepatocellular carcinomas or metastatic disease.  PET scan on 01/28/2018 revealed hypermetabolism associated with a large ill-defined left hepatic lobe mass (SUV 7.0) as well as periportal/gastrohepatic ligament adenopathy (up to 1.5 cm: SUV 4.3).  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  There was aortic atherosclerosis, three-vessel coronary artery calcification and bilateral renal stones.  He is s/p cycle #1 cisplatin and gemcitabine (05/07/2018).  Nadir platelet count was 41,000.  He has iron deficiency anemia.  Labs on 01/22/2018 revealed a ferritin 12 and an iron saturation of 9%. B12 and folate were normal.  Labs on 11/26/2017 revealed a normal creatinine and TSH.   Diet appears good.   [facility] at [facility] on 04/17/2017 revealed diffuse atrophic mucosa in the entire stomach and multiple localized erosions without bleeding.  Pathology revealed foveolar hyperplasia c/w reactive gastropathy.  Colonoscopy at [facility] on 04/17/2017 revealed two 5-7 mm polyps in the descending colon and transverse colon (adenomatous polyps).  Capsule study on 05/05/2017 revealed polyps x 2 in the proximal small bowel and AVMs x 2 in the terminal ileum and proximal small bowel.  Plan was anterograde and retrograde balloon enteroscopy at [facility].  Code status is DNI.  Symptomatically, he is dehydrated.  He has eaten and had little to drink since yesterday.  Blood pressure is low.  He is slightly confused.  Creatinine is 1.39.  Magnesium is 1.3.  Plan: 1. Labs today:  CBC with diff, CMP, Mg. 2. Intrahepatic cholangiocarcinoma  Patient tolerated cycle #1 chemotherapy.  Nadir platelet count was 41,000.  He denied any significant nausea or vomiting with chemotherapy.  Counts have recovered.  Discuss plan for postponing chemotherapy until improvement in mental status and hydration status. 3. Mild thrombocytopenia Platelet count 245,000. Anticipate gemcitabine dose reduction (75% of dose) with cycle #2. 4. Pain symptom management  Patient is concerned about getting hooked on pain medication.  Discuss importance of managing pain (affects appetite). 5. Fluids, electrolytes and nutrition  Weight down 3 pounds.    Creatinine increased to 0.83 to 1.39.  Potassium 5.3.  Magnesium 1.3.  Poor caloric intake and hydration over past 24 hours.  Discuss plan for IV hydration  Discuss importance of fluid intake.  Anticipate magnesium supplementation. 6.   Altered mental status  Etiology likely secondary to dehydration.  Will need non-contrast head CT secondary to recent fall. 7.   Patient to ER for evaluation. 8.   RTC in 1 week for MD assessment, labs (CBC with diff, CMP, Mg), and day 1 of cycle #2  cisplatin and gemcitabine.   Honor Loh, NP  05/28/18, 9:08 AM     I saw and evaluated the patient, participating in the key portions of the service and  reviewing pertinent diagnostic studies and records.  I reviewed the nurse practitioner's note and agree with the findings and the plan.  The assessment and plan were discussed with the patient.  Several questions were asked by the patient and answered.   Nolon Stalls, MD 05/28/18, 9:08 AM

## 2018-05-28 ENCOUNTER — Inpatient Hospital Stay: Payer: No Typology Code available for payment source

## 2018-05-28 ENCOUNTER — Encounter: Payer: Self-pay | Admitting: Hematology and Oncology

## 2018-05-28 ENCOUNTER — Emergency Department
Admission: EM | Admit: 2018-05-28 | Discharge: 2018-05-28 | Disposition: A | Discharge status: Home / Self Care | Payer: Medicare (Managed Care) | Attending: Emergency Medicine | Admitting: Emergency Medicine

## 2018-05-28 ENCOUNTER — Telehealth: Payer: Self-pay

## 2018-05-28 ENCOUNTER — Inpatient Hospital Stay (HOSPITAL_BASED_OUTPATIENT_CLINIC_OR_DEPARTMENT_OTHER): Payer: No Typology Code available for payment source | Admitting: Hematology and Oncology

## 2018-05-28 ENCOUNTER — Other Ambulatory Visit: Payer: Self-pay

## 2018-05-28 VITALS — BP 86/54 | HR 98 | Temp 98.1°F | Resp 12 | Ht 68.0 in | Wt 180.6 lb

## 2018-05-28 DIAGNOSIS — I951 Orthostatic hypotension: Secondary | ICD-10-CM | POA: Insufficient documentation

## 2018-05-28 DIAGNOSIS — Z87891 Personal history of nicotine dependence: Secondary | ICD-10-CM | POA: Diagnosis not present

## 2018-05-28 DIAGNOSIS — Z79899 Other long term (current) drug therapy: Secondary | ICD-10-CM | POA: Insufficient documentation

## 2018-05-28 DIAGNOSIS — E1122 Type 2 diabetes mellitus with diabetic chronic kidney disease: Secondary | ICD-10-CM | POA: Insufficient documentation

## 2018-05-28 DIAGNOSIS — C221 Intrahepatic bile duct carcinoma: Secondary | ICD-10-CM

## 2018-05-28 DIAGNOSIS — K219 Gastro-esophageal reflux disease without esophagitis: Secondary | ICD-10-CM | POA: Diagnosis not present

## 2018-05-28 DIAGNOSIS — Z794 Long term (current) use of insulin: Secondary | ICD-10-CM | POA: Diagnosis not present

## 2018-05-28 DIAGNOSIS — E875 Hyperkalemia: Secondary | ICD-10-CM

## 2018-05-28 DIAGNOSIS — G893 Neoplasm related pain (acute) (chronic): Secondary | ICD-10-CM | POA: Diagnosis not present

## 2018-05-28 DIAGNOSIS — I252 Old myocardial infarction: Secondary | ICD-10-CM | POA: Diagnosis not present

## 2018-05-28 DIAGNOSIS — E861 Hypovolemia: Secondary | ICD-10-CM

## 2018-05-28 DIAGNOSIS — I9589 Other hypotension: Secondary | ICD-10-CM | POA: Diagnosis not present

## 2018-05-28 DIAGNOSIS — I509 Heart failure, unspecified: Secondary | ICD-10-CM | POA: Diagnosis not present

## 2018-05-28 DIAGNOSIS — R410 Disorientation, unspecified: Secondary | ICD-10-CM | POA: Diagnosis present

## 2018-05-28 DIAGNOSIS — E7800 Pure hypercholesterolemia, unspecified: Secondary | ICD-10-CM | POA: Diagnosis not present

## 2018-05-28 DIAGNOSIS — Z955 Presence of coronary angioplasty implant and graft: Secondary | ICD-10-CM | POA: Diagnosis not present

## 2018-05-28 DIAGNOSIS — N183 Chronic kidney disease, stage 3 (moderate): Secondary | ICD-10-CM | POA: Diagnosis not present

## 2018-05-28 DIAGNOSIS — G20 Parkinson's disease: Secondary | ICD-10-CM | POA: Insufficient documentation

## 2018-05-28 DIAGNOSIS — J449 Chronic obstructive pulmonary disease, unspecified: Secondary | ICD-10-CM | POA: Diagnosis not present

## 2018-05-28 DIAGNOSIS — E1140 Type 2 diabetes mellitus with diabetic neuropathy, unspecified: Secondary | ICD-10-CM | POA: Diagnosis not present

## 2018-05-28 DIAGNOSIS — E860 Dehydration: Secondary | ICD-10-CM | POA: Insufficient documentation

## 2018-05-28 DIAGNOSIS — I130 Hypertensive heart and chronic kidney disease with heart failure and stage 1 through stage 4 chronic kidney disease, or unspecified chronic kidney disease: Secondary | ICD-10-CM | POA: Diagnosis not present

## 2018-05-28 DIAGNOSIS — R630 Anorexia: Secondary | ICD-10-CM | POA: Diagnosis not present

## 2018-05-28 DIAGNOSIS — I4891 Unspecified atrial fibrillation: Secondary | ICD-10-CM | POA: Diagnosis not present

## 2018-05-28 DIAGNOSIS — M1990 Unspecified osteoarthritis, unspecified site: Secondary | ICD-10-CM | POA: Diagnosis not present

## 2018-05-28 DIAGNOSIS — F329 Major depressive disorder, single episode, unspecified: Secondary | ICD-10-CM | POA: Diagnosis not present

## 2018-05-28 DIAGNOSIS — N189 Chronic kidney disease, unspecified: Secondary | ICD-10-CM | POA: Diagnosis not present

## 2018-05-28 LAB — CBC WITH DIFFERENTIAL/PLATELET
Abs Immature Granulocytes: 0.36 10*3/uL — ABNORMAL HIGH (ref 0.00–0.07)
Basophils Absolute: 0.1 10*3/uL (ref 0.0–0.1)
Basophils Relative: 1 %
Eosinophils Absolute: 0 10*3/uL (ref 0.0–0.5)
Eosinophils Relative: 0 %
HCT: 41.8 % (ref 39.0–52.0)
Hemoglobin: 13.6 g/dL (ref 13.0–17.0)
Immature Granulocytes: 5 %
Lymphocytes Relative: 18 %
Lymphs Abs: 1.2 10*3/uL (ref 0.7–4.0)
MCH: 28.9 pg (ref 26.0–34.0)
MCHC: 32.5 g/dL (ref 30.0–36.0)
MCV: 88.7 fL (ref 80.0–100.0)
Monocytes Absolute: 0.7 10*3/uL (ref 0.1–1.0)
Monocytes Relative: 10 %
Neutro Abs: 4.5 10*3/uL (ref 1.7–7.7)
Neutrophils Relative %: 66 %
Platelets: 245 10*3/uL (ref 150–400)
RBC: 4.71 MIL/uL (ref 4.22–5.81)
RDW: 13.6 % (ref 11.5–15.5)
WBC: 6.9 10*3/uL (ref 4.0–10.5)
nRBC: 0.3 % — ABNORMAL HIGH (ref 0.0–0.2)

## 2018-05-28 LAB — COMPREHENSIVE METABOLIC PANEL
ALT: 21 U/L (ref 0–44)
AST: 30 U/L (ref 15–41)
Albumin: 2.8 g/dL — ABNORMAL LOW (ref 3.5–5.0)
Alkaline Phosphatase: 104 U/L (ref 38–126)
Anion gap: 17 — ABNORMAL HIGH (ref 5–15)
BUN: 34 mg/dL — ABNORMAL HIGH (ref 8–23)
CO2: 22 mmol/L (ref 22–32)
Calcium: 8.3 mg/dL — ABNORMAL LOW (ref 8.9–10.3)
Chloride: 96 mmol/L — ABNORMAL LOW (ref 98–111)
Creatinine, Ser: 1.39 mg/dL — ABNORMAL HIGH (ref 0.61–1.24)
GFR calc Af Amer: 56 mL/min — ABNORMAL LOW (ref >60)
GFR calc non Af Amer: 48 mL/min — ABNORMAL LOW (ref >60)
Glucose, Bld: 319 mg/dL — ABNORMAL HIGH (ref 70–99)
Potassium: 5.3 mmol/L — ABNORMAL HIGH (ref 3.5–5.1)
Sodium: 135 mmol/L (ref 135–145)
Total Bilirubin: 0.6 mg/dL (ref 0.3–1.2)
Total Protein: 5.9 g/dL — ABNORMAL LOW (ref 6.5–8.1)

## 2018-05-28 LAB — URINALYSIS, COMPLETE (UACMP) WITH MICROSCOPIC
Bilirubin Urine: NEGATIVE
Glucose, UA: >=500 mg/dL — AB
Hgb urine dipstick: NEGATIVE
Ketones, ur: 5 mg/dL — AB
Nitrite: NEGATIVE
Protein, ur: NEGATIVE mg/dL
Specific Gravity, Urine: 1.013 (ref 1.005–1.030)
pH: 5 (ref 5.0–8.0)

## 2018-05-28 LAB — MAGNESIUM: Magnesium: 1.3 mg/dL — ABNORMAL LOW (ref 1.7–2.4)

## 2018-05-28 MED ORDER — SODIUM CHLORIDE 0.9% FLUSH
10.0000 mL | INTRAVENOUS | Status: AC | PRN
  Administered 2018-05-28: 10 mL via INTRAVENOUS
  Filled 2018-05-28: qty 10

## 2018-05-28 MED ORDER — OXYCODONE-ACETAMINOPHEN 5-325 MG PO TABS
1.0000 | ORAL_TABLET | Freq: Once | ORAL | Status: AC
  Administered 2018-05-28: 1 via ORAL
  Filled 2018-05-28: qty 1

## 2018-05-28 MED ORDER — SODIUM CHLORIDE 0.9 % IV SOLN
Freq: Once | INTRAVENOUS | Status: AC
  Administered 2018-05-28: 16:00:00 via INTRAVENOUS

## 2018-05-28 MED ORDER — HEPARIN SOD (PORK) LOCK FLUSH 100 UNIT/ML IV SOLN: 500.0000 [IU] | Freq: Once | INTRAVENOUS | Status: AC

## 2018-05-28 MED ORDER — FLUDROCORTISONE ACETATE 0.1 MG PO TABS
0.1000 mg | ORAL_TABLET | Freq: Every day | ORAL | Status: DC
  Filled 2018-05-28: qty 1

## 2018-05-28 NOTE — Progress Notes (Signed)
Patient here for treatment. He appears confused today. He could not repeat his date of birth, poor historian for medication reconciliation. See follow up.

## 2018-05-28 NOTE — ED Notes (Signed)
Pt given a lunch tray upon request

## 2018-05-28 NOTE — ED Triage Notes (Signed)
Pt was brought over from the cancer center. States that his blood wasn't right so he couldn't take his treatment, pt able to state his name and birth date with out difficulty in triage. Pt reports that he feels tired and weak

## 2018-05-28 NOTE — ED Provider Notes (Signed)
[facility] Emergency Department Provider Note  ____________________________________________  Time seen: Approximately 3:27 PM  I have reviewed the triage vital signs and the nursing notes.   HISTORY  Chief Complaint Abnormal Lab    HPI Jimmy Hill. is a 78 y.o. male with a h/o dm, copd, cholangiocarcinoma who was sent to the ED today from cancer center due to concerns about confusion and low blood pressure.   Pt reports he has had low appetite and poor oral intake for the past 5 days.  No other acute symptoms.  Chronic sob at baseline. Chronic back pain x 40 years, unchanged.  No f/c/sweats or weight changes. Denies dizziness or syncope or chest pain.    Charge nurse reports pt has been seen by his [facility] nurse who finds pt to be at chronic baseline. They report they could assist the patient with transport home and follow up with him closely.         Past Medical History:  Diagnosis Date  . Allergic rhinitis   . Anginal pain ([place])   . Arthritis   . Autonomic dysreflexia   . Back pain    CHRONIC LBP,STENOSIS  . CHF (congestive heart failure) ([place])   . Chronic kidney disease    STAGE 3  . COPD (chronic obstructive pulmonary disease) ([place])   . Coronary artery disease   . Depression   . Diabetes ([place])   . Diabetes mellitus without complication ([place])   . DJD (degenerative joint disease)   . Dysphagia   . Dysrhythmia    A-fib, now converted  . Edema    FEET/ANKLES  . GERD (gastroesophageal reflux disease)   . Glaucoma   . H/O orthostatic hypotension   . Hypercholesterolemia   . Myocardial infarction ([place]) 2000  . Parkinson's disease ([place])   . Peripheral vascular disease ([place])   . Pneumonia    history of   . Polyneuropathy   . RLS (restless legs syndrome)   . Seizures ([place])    when in rehab for his hip  . Sleep apnea   . Smoker   . Spinal stenosis   . Strabismic amblyopia   . Tinnitus      Patient Active Problem List   Diagnosis Date Noted  . Encounter for antineoplastic chemotherapy 04/30/2018  . Dehydration 04/30/2018  . Cholangiocarcinoma ([place]) 04/20/2018  . Cancer-related pain 03/18/2018  . Goals of care, counseling/discussion 03/18/2018  . Iron deficiency 03/02/2018  . Liver lesion 01/22/2018  . Acute exacerbation of chronic obstructive pulmonary disease (COPD) ([place]) 09/23/2017  . Sepsis ([place]) 05/01/2017  . Syncope 04/30/2017  . New onset atrial fibrillation ([place]) 07/03/2016  . Loss of weight   . Gastritis without bleeding   . Occult GI bleeding   . GI bleed 03/02/2016  . Left-sided weakness 03/02/2016  . Rectal bleeding 01/16/2016  . Colitis presumed infectious 01/10/2016  . Pressure ulcer 12/29/2015  . Altered mental status 12/28/2015  . Fracture of femoral neck, left ([place]) 12/03/2015  . CAD (coronary artery disease) 12/03/2015  . Sleep apnea 12/03/2015  . Femoral neck fracture, left, closed, initial encounter 12/03/2015  . Unstable angina ([place]) 05/14/2015  . COPD (chronic obstructive pulmonary disease) ([place]) 02/05/2015  . Acute bronchitis 02/05/2015  . Type 2 diabetes mellitus ([place]) 02/05/2015  . Chest pain 12/09/2014  . Presence of drug coated stent in LAD coronary artery 07/11/2013  . Disease characterized by destruction of skeletal muscle 09/12/2009  . Encephalopathy 09/12/2009  . Vitamin D  deficiency 05/05/2008  . Polyneuropathy in diabetes ([place]) 08/17/2005  . Spinal stenosis in cervical region 03/20/2005  . Essential tremor 01/17/2005  . Hypercholesterolemia 03/21/2004  . Hypertension, benign 01/08/2004  . 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 Localized osteoarthrosis, lower leg 04/07/2002  . Parkinson's disease ([place]) 09/21/2000  . Peptic ulcer with hemorrhage and perforation ([place]) 04/19/1996  . 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     Past Surgical History:  Procedure Laterality Date  . ANKLE SURGERY    . APPENDECTOMY    . BACK SURGERY    . CATARACT  EXTRACTION W/PHACO Left 08/26/2016   Procedure: CATARACT EXTRACTION PHACO AND INTRAOCULAR LENS PLACEMENT (IOC);  Surgeon: Birder Robson, MD;  Location: [facility] ORS;  Service: Ophthalmology;  Laterality: Left;  [place] 01:37 AP% 19.4 CDE 18.97 fluid pack lot # [id number]  . CORONARY ANGIOPLASTY     STENT  . CTR    . ESOPHAGOGASTRODUODENOSCOPY (EGD) WITH PROPOFOL N/A 03/04/2016   Procedure: ESOPHAGOGASTRODUODENOSCOPY (EGD) WITH PROPOFOL;  Surgeon: [name], MD;  Location: [facility] ENDOSCOPY;  Service: Endoscopy;  Laterality: N/A;  . EYE SURGERY     cataract  . FRACTURE SURGERY    . HIP PINNING,CANNULATED Left 12/04/2015   Procedure: CANNULATED HIP PINNING;  Surgeon: Thornton Park, MD;  Location: [facility] ORS;  Service: Orthopedics;  Laterality: Left;  . PORTA CATH INSERTION N/A 04/28/2018   Procedure: PORTA CATH INSERTION;  Surgeon: Katha Cabal, MD;  Location: [place] [facility];  Service: Cardiovascular;  Laterality: N/A;     Prior to Admission medications   Medication Sig Start Date End Date Taking? Authorizing Provider  acetaminophen (TYLENOL) 325 MG tablet Take 650 mg by mouth every 6 (six) hours as needed.    [provider]  albuterol (PROVENTIL HFA;VENTOLIN HFA) 108 (90 Base) MCG/ACT inhaler Inhale into the lungs every 6 (six) hours as needed for wheezing or shortness of breath.    [provider]  albuterol (PROVENTIL) (2.5 MG/3ML) 0.083% nebulizer solution Take 2.5 mg by nebulization 4 (four) times daily as needed for wheezing or shortness of breath.    [provider]  amiodarone (PACERONE) 100 MG tablet Take 100 mg by mouth daily.    [provider]  atorvastatin (LIPITOR) 80 MG tablet Take 80 mg by mouth at bedtime.    [provider]  brimonidine-timolol (COMBIGAN) 0.2-0.5 % ophthalmic solution Place 1 drop into both eyes 2 (two) times daily.    [provider]  Calcium Carbonate-Vitamin D (CALCIUM 600+D PO) Take 1 tablet by  mouth daily.    [provider]  carbidopa-levodopa (SINEMET IR) 25-250 MG tablet Take 2 tablets by mouth every morning (8AM), 1 tablet at noon, 1 tablet every afternoon (430PM) and 1 tablet at bedtime (10PM)    [provider]  Carboxymethylcellul-Glycerin (REFRESH OPTIVE OP) Apply 2 drops to eye every 8 (eight) hours as needed (dry eyes).    [provider]  dexamethasone (DECADRON) 4 MG tablet Take 2 tablets by mouth once a day on the day after chemotherapy and then take 1 tablet two times a day for 2 days. Take with food. 04/27/18   Lequita Asal, MD  Dextromethorphan-Guaifenesin (TUSSIN DM) 10-100 MG/5ML liquid Take 5 mLs by mouth every 6 (six) hours as needed (cough).     [provider]  dextrose (GLUTOSE 15) 40 % GEL Take 1 Tube by mouth. For blood sugar less than 70, repeat in 5 minutes up to 2 doses  [provider]  ferrous sulfate 325 (65 FE) MG tablet Take 325 mg by mouth daily with breakfast.    [provider]  FLUDROCORTISONE ACETATE PO Take 0.1 mg by mouth daily.    [provider]  ibuprofen (ADVIL,MOTRIN) 200 MG tablet Take 400 mg by mouth every 6 (six) hours as needed.    [provider]  insulin glargine (LANTUS) 100 UNIT/ML injection Inject 20 Units into the skin every morning.     Gareth Morgan, MD  isosorbide mononitrate (ISMO,MONOKET) 10 MG tablet Take 5 mg by mouth at bedtime.     [provider]  Lenvatinib 12 mg daily dose (LENVIMA) 3 x 4 MG capsule Take 12 mg by mouth daily. 03/23/18   Karen Kitchens, NP  levETIRAcetam (KEPPRA) 500 MG tablet Take 500 mg by mouth 2 (two) times daily.    [provider]  lidocaine (LIDODERM) 5 % Place 1 patch onto the skin daily. Remove & Discard patch within 12 hours or as directed by MD    [provider]  lidocaine-prilocaine (EMLA) cream Apply to affected area once 04/27/18   Lequita Asal, MD  loperamide (IMODIUM) 2 MG  capsule Take by mouth as needed for diarrhea or loose stools.    [provider]  magnesium oxide (MAG-OX) 400 MG tablet Take 400 mg by mouth daily.    [provider]  metFORMIN (GLUCOPHAGE) 1000 MG tablet Take 1,000 mg by mouth 2 (two) times daily with a meal.     Gareth Morgan, MD  metoprolol succinate (TOPROL-XL) 25 MG 24 hr tablet Take 25 mg by mouth daily.    [provider]  mirabegron ER (MYRBETRIQ) 50 MG TB24 tablet Take 50 mg by mouth daily.    [provider]  mometasone-formoterol (DULERA) 100-5 MCG/ACT AERO Inhale 2 puffs into the lungs 2 (two) times daily.    [provider]  naloxone [facility]) nasal spray 4 mg/0.1 mL Place 1 spray into the nose.    [provider]  nitroGLYCERIN (NITROSTAT) 0.4 MG SL tablet Place 0.4 mg under the tongue every 5 (five) minutes as needed for chest pain.    [provider]  ondansetron (ZOFRAN) 8 MG tablet Take 1 tablet (8 mg total) by mouth 2 (two) times daily as needed. Start on the third day after chemotherapy. 04/27/18   Lequita Asal, MD  Oxycodone HCl 10 MG TABS Take 10 mg by mouth 3 (three) times daily as needed.     [provider]  pantoprazole (PROTONIX) 40 MG tablet Take 40 mg by mouth daily.    [provider]  polyethylene glycol (MIRALAX / GLYCOLAX) packet Take 17 g by mouth daily.    [provider]  potassium chloride (K-DUR,KLOR-CON) 10 MEQ tablet Take 40 mEq by mouth daily.     [provider]  prochlorperazine (COMPAZINE) 10 MG tablet Take 1 tablet (10 mg total) by mouth every 6 (six) hours as needed (Nausea or vomiting). 04/27/18   Lequita Asal, MD  senna-docusate (SENOKOT-S) 8.6-50 MG tablet Take 2 tablets by mouth at bedtime as needed.     [provider]  traMADol (ULTRAM) 50 MG tablet Take 50 mg by mouth every 6 (six) hours as needed for moderate pain.    [provider]  vitamin B-12 (CYANOCOBALAMIN)  1000 MCG tablet Take 1,000 mcg by mouth daily.    [provider]  Vitamins A & D (VITAMIN A &  D) ointment Apply 1 application topically as needed for dry skin.    [provider]     Allergies Bupropion; Bactrim [sulfamethoxazole-trimethoprim]; and Cheese   Family History  Problem Relation Age of Onset  . Heart disease Father   . Diabetes Mellitus II Other     Social History Social History   Tobacco Use  . Smoking status: Former Smoker    Packs/day: 0.50    Types: Cigarettes    Last attempt to quit: 11/17/2015    Years since quitting: 2.5  . Smokeless tobacco: Never Used  Substance Use Topics  . Alcohol use: No  . Drug use: No    Review of Systems  Constitutional:   No fever or chills.  ENT:   No sore throat. No rhinorrhea. Cardiovascular:   No chest pain or syncope. Respiratory: Chronic shortness of breath without cough. Gastrointestinal:   Negative for abdominal pain, vomiting and diarrhea.  Musculoskeletal:   Chronic low back pain at baseline All other systems reviewed and are negative except as documented above in ROS and HPI.  ____________________________________________   PHYSICAL EXAM:  VITAL SIGNS: ED Triage Vitals  Enc Vitals Group     BP 05/28/18 1003 (!) 118/51     Pulse Rate 05/28/18 1003 83     Resp 05/28/18 1003 16     Temp 05/28/18 1003 97.7 F (36.5 C)     Temp Source 05/28/18 1003 Oral     SpO2 05/28/18 1003 95 %     Weight --      Height --      Head Circumference --      Peak Flow --      Pain Score 05/28/18 1005 10     Pain Loc --      Pain Edu? --      Excl. in [place]? --     Vital signs reviewed, nursing assessments reviewed.   Constitutional:   Alert and oriented. Non-toxic appearance. Eyes:   Conjunctivae are normal. EOMI. PERRL. ENT      Head:   Normocephalic and atraumatic.      Nose:   No congestion/rhinnorhea.       Mouth/Throat:   MMM, no pharyngeal erythema. No peritonsillar mass.       Neck:   No  meningismus. Full ROM. Hematological/Lymphatic/Immunilogical:   No cervical lymphadenopathy. Cardiovascular:   RRR. Symmetric bilateral radial and DP pulses.  No murmurs. Cap refill less than 2 seconds. Respiratory:   Normal respiratory effort without tachypnea/retractions. Breath sounds are clear and equal bilaterally. No wheezes/rales/rhonchi. Gastrointestinal:   Soft and nontender. Non distended. There is no CVA tenderness.  No rebound, rigidity, or guarding. Genitourinary:   deferred Musculoskeletal:   Normal range of motion in all extremities. No joint effusions.  No lower extremity tenderness.  No edema. Neurologic:   Normal speech and language.  Motor grossly intact. No acute focal neurologic deficits are appreciated.  Skin:    Skin is warm, dry and intact. No rash noted.  No petechiae, purpura, or bullae.  ____________________________________________    LABS (pertinent positives/negatives) (all labs ordered are listed, but only abnormal results are displayed) Labs Reviewed - No data to display ____________________________________________   EKG    ____________________________________________    RADIOLOGY  No results found.  ____________________________________________   PROCEDURES Procedures  ____________________________________________    CLINICAL IMPRESSION / ASSESSMENT AND PLAN / ED COURSE  Pertinent labs & imaging results that were available during my care of the patient  were reviewed by me and considered in my medical decision making (see chart for details).    Patient sent to ED from cancer center due to hypotension.  Likely due to poor oral intake over last 5 days.  Patient's PCP, Dr. Meredith Staggers also comes to the ED, personally evaluated the patient and discussed with me.  He also notes the patient has not had his Florinef in 2 days which he takes for orthostatic hypotension.  They feel comfortable that the patient is at baseline and can be followed up  closely at home by pace.  Patient was given a liter of IV fluids for his elevated creatinine with a slight anion gap acidosis, all likely due to dehydration.  Given that he has pace taking good care of him, I think the patient can be managed outpatient and discharged home.  He is comfortable with that, his PCP is comfortable with that.  I doubt any acute infectious process such as UTI cellulitis pneumonia.  He is not septic and not in shock.      ____________________________________________   FINAL CLINICAL IMPRESSION(S) / ED DIAGNOSES    Final diagnoses:  Orthostatic hypotension  Dehydration     ED Discharge Orders    None      Portions of this note were generated with dragon dictation software. Dictation errors may occur despite best attempts at proofreading.   Carrie Mew, MD 05/28/18 [phone]

## 2018-05-28 NOTE — Discharge Instructions (Signed)
Continue taking all your medications as prescribed

## 2018-05-28 NOTE — ED Notes (Signed)
Pt brought back to room for fluid.  pT is alert and oriented and denies any distress.  Pt is to receive some NS to help hydrate him before d/c home.  I am told that RN has spoken with Dr. Marcille Buffy. Pt was sent here for evaluation of hypotension and aloc but has not had these symptoms per report since he has been here. Brought pt some orange juice.  Pt appears tired but denies any distress

## 2018-05-28 NOTE — ED Notes (Signed)
Asked pt for a urine sample. Pt declines

## 2018-05-28 NOTE — Telephone Encounter (Signed)
The patient was sent over to the ED for further care/ Port was not de acessed today in the office. message sent over to Nurse ([employer]) and Debbora Lacrosse

## 2018-05-29 LAB — AFP TUMOR MARKER: AFP, Serum, Tumor Marker: 98 ng/mL — ABNORMAL HIGH (ref 0.0–8.3)

## 2018-05-29 LAB — CANCER ANTIGEN 19-9: CA 19-9: 203 U/mL — ABNORMAL HIGH (ref 0–35)

## 2018-05-30 ENCOUNTER — Emergency Department: Payer: No Typology Code available for payment source

## 2018-05-30 ENCOUNTER — Other Ambulatory Visit: Payer: Self-pay

## 2018-05-30 ENCOUNTER — Observation Stay: Payer: No Typology Code available for payment source

## 2018-05-30 ENCOUNTER — Observation Stay
Admission: EM | Admit: 2018-05-30 | Discharge: 2018-06-01 | Disposition: A | Discharge status: Home / Self Care | Payer: No Typology Code available for payment source | Attending: Internal Medicine | Admitting: Internal Medicine

## 2018-05-30 ENCOUNTER — Encounter: Payer: Self-pay | Admitting: Radiology

## 2018-05-30 DIAGNOSIS — N183 Chronic kidney disease, stage 3 (moderate): Secondary | ICD-10-CM | POA: Diagnosis not present

## 2018-05-30 DIAGNOSIS — R197 Diarrhea, unspecified: Secondary | ICD-10-CM | POA: Insufficient documentation

## 2018-05-30 DIAGNOSIS — G2581 Restless legs syndrome: Secondary | ICD-10-CM | POA: Insufficient documentation

## 2018-05-30 DIAGNOSIS — Z794 Long term (current) use of insulin: Secondary | ICD-10-CM | POA: Insufficient documentation

## 2018-05-30 DIAGNOSIS — I2510 Atherosclerotic heart disease of native coronary artery without angina pectoris: Secondary | ICD-10-CM | POA: Diagnosis not present

## 2018-05-30 DIAGNOSIS — E1122 Type 2 diabetes mellitus with diabetic chronic kidney disease: Secondary | ICD-10-CM | POA: Insufficient documentation

## 2018-05-30 DIAGNOSIS — I509 Heart failure, unspecified: Secondary | ICD-10-CM | POA: Diagnosis not present

## 2018-05-30 DIAGNOSIS — Z791 Long term (current) use of non-steroidal anti-inflammatories (NSAID): Secondary | ICD-10-CM | POA: Diagnosis not present

## 2018-05-30 DIAGNOSIS — Z79899 Other long term (current) drug therapy: Secondary | ICD-10-CM | POA: Diagnosis not present

## 2018-05-30 DIAGNOSIS — G904 Autonomic dysreflexia: Secondary | ICD-10-CM | POA: Insufficient documentation

## 2018-05-30 DIAGNOSIS — Z87891 Personal history of nicotine dependence: Secondary | ICD-10-CM | POA: Insufficient documentation

## 2018-05-30 DIAGNOSIS — E7800 Pure hypercholesterolemia, unspecified: Secondary | ICD-10-CM | POA: Diagnosis not present

## 2018-05-30 DIAGNOSIS — Z79891 Long term (current) use of opiate analgesic: Secondary | ICD-10-CM | POA: Insufficient documentation

## 2018-05-30 DIAGNOSIS — R55 Syncope and collapse: Secondary | ICD-10-CM | POA: Diagnosis not present

## 2018-05-30 DIAGNOSIS — K219 Gastro-esophageal reflux disease without esophagitis: Secondary | ICD-10-CM | POA: Diagnosis not present

## 2018-05-30 DIAGNOSIS — G4730 Sleep apnea, unspecified: Secondary | ICD-10-CM | POA: Insufficient documentation

## 2018-05-30 DIAGNOSIS — I252 Old myocardial infarction: Secondary | ICD-10-CM | POA: Diagnosis not present

## 2018-05-30 DIAGNOSIS — M1990 Unspecified osteoarthritis, unspecified site: Secondary | ICD-10-CM | POA: Insufficient documentation

## 2018-05-30 DIAGNOSIS — R269 Unspecified abnormalities of gait and mobility: Secondary | ICD-10-CM | POA: Insufficient documentation

## 2018-05-30 DIAGNOSIS — G20 Parkinson's disease: Secondary | ICD-10-CM | POA: Diagnosis not present

## 2018-05-30 DIAGNOSIS — M25569 Pain in unspecified knee: Secondary | ICD-10-CM

## 2018-05-30 DIAGNOSIS — I081 Rheumatic disorders of both mitral and tricuspid valves: Secondary | ICD-10-CM | POA: Diagnosis not present

## 2018-05-30 DIAGNOSIS — E872 Acidosis: Secondary | ICD-10-CM | POA: Diagnosis not present

## 2018-05-30 DIAGNOSIS — I130 Hypertensive heart and chronic kidney disease with heart failure and stage 1 through stage 4 chronic kidney disease, or unspecified chronic kidney disease: Secondary | ICD-10-CM | POA: Diagnosis not present

## 2018-05-30 DIAGNOSIS — E1151 Type 2 diabetes mellitus with diabetic peripheral angiopathy without gangrene: Secondary | ICD-10-CM | POA: Diagnosis not present

## 2018-05-30 DIAGNOSIS — C221 Intrahepatic bile duct carcinoma: Secondary | ICD-10-CM | POA: Insufficient documentation

## 2018-05-30 LAB — URINALYSIS, COMPLETE (UACMP) WITH MICROSCOPIC
Bacteria, UA: NONE SEEN
Bilirubin Urine: NEGATIVE
Glucose, UA: NEGATIVE mg/dL
Hgb urine dipstick: NEGATIVE
Ketones, ur: NEGATIVE mg/dL
Leukocytes, UA: NEGATIVE
NITRITE: NEGATIVE
PROTEIN: NEGATIVE mg/dL
Specific Gravity, Urine: 1.017 (ref 1.005–1.030)
pH: 6 (ref 5.0–8.0)

## 2018-05-30 LAB — COMPREHENSIVE METABOLIC PANEL
ALT: 24 U/L (ref 0–44)
AST: 31 U/L (ref 15–41)
Albumin: 3.1 g/dL — ABNORMAL LOW (ref 3.5–5.0)
Alkaline Phosphatase: 128 U/L — ABNORMAL HIGH (ref 38–126)
Anion gap: 12 (ref 5–15)
BUN: 25 mg/dL — ABNORMAL HIGH (ref 8–23)
CHLORIDE: 96 mmol/L — AB (ref 98–111)
CO2: 30 mmol/L (ref 22–32)
Calcium: 8.7 mg/dL — ABNORMAL LOW (ref 8.9–10.3)
Creatinine, Ser: 0.93 mg/dL (ref 0.61–1.24)
GFR calc Af Amer: >60 mL/min (ref >60)
GFR calc non Af Amer: >60 mL/min (ref >60)
Glucose, Bld: 121 mg/dL — ABNORMAL HIGH (ref 70–99)
Potassium: 4.2 mmol/L (ref 3.5–5.1)
Sodium: 138 mmol/L (ref 135–145)
Total Bilirubin: 0.7 mg/dL (ref 0.3–1.2)
Total Protein: 6.1 g/dL — ABNORMAL LOW (ref 6.5–8.1)

## 2018-05-30 LAB — CBC
HCT: 47 % (ref 39.0–52.0)
Hemoglobin: 15.5 g/dL (ref 13.0–17.0)
MCH: 29.1 pg (ref 26.0–34.0)
MCHC: 33 g/dL (ref 30.0–36.0)
MCV: 88.3 fL (ref 80.0–100.0)
PLATELETS: 242 10*3/uL (ref 150–400)
RBC: 5.32 MIL/uL (ref 4.22–5.81)
RDW: 13.9 % (ref 11.5–15.5)
WBC: 7.7 10*3/uL (ref 4.0–10.5)
nRBC: 0.7 % — ABNORMAL HIGH (ref 0.0–0.2)

## 2018-05-30 LAB — TROPONIN I: Troponin I: 0.03 ng/mL (ref <0.03)

## 2018-05-30 LAB — GLUCOSE, CAPILLARY: Glucose-Capillary: 107 mg/dL — ABNORMAL HIGH (ref 70–99)

## 2018-05-30 LAB — LACTIC ACID, PLASMA: Lactic Acid, Venous: 3.5 mmol/L (ref 0.5–1.9)

## 2018-05-30 LAB — LIPASE, BLOOD: Lipase: 28 U/L (ref 11–51)

## 2018-05-30 LAB — C DIFFICILE QUICK SCREEN W PCR REFLEX
C Diff antigen: NEGATIVE
C Diff interpretation: NOT DETECTED
C Diff toxin: NEGATIVE

## 2018-05-30 LAB — CG4 I-STAT (LACTIC ACID): Lactic Acid, Venous: 4.56 mmol/L (ref 0.5–1.9)

## 2018-05-30 LAB — TSH: TSH: 0.814 u[IU]/mL (ref 0.350–4.500)

## 2018-05-30 MED ORDER — BRIMONIDINE TARTRATE-TIMOLOL 0.2-0.5 % OP SOLN
1.0000 [drp] | Freq: Two times a day (BID) | OPHTHALMIC | Status: DC
  Filled 2018-05-30: qty 5

## 2018-05-30 MED ORDER — SODIUM CHLORIDE 0.9 % IV SOLN
INTRAVENOUS | Status: AC
  Administered 2018-05-30 – 2018-05-31 (×2): via INTRAVENOUS

## 2018-05-30 MED ORDER — FERROUS SULFATE 325 (65 FE) MG PO TABS
325.0000 mg | ORAL_TABLET | Freq: Every day | ORAL | Status: DC
  Administered 2018-05-31: 325 mg via ORAL
  Filled 2018-05-30 (×2): qty 1

## 2018-05-30 MED ORDER — BACITRACIN-NEOMYCIN-POLYMYXIN 400-5-5000 EX OINT
TOPICAL_OINTMENT | Freq: Once | CUTANEOUS | Status: AC
  Administered 2018-05-30: 14:00:00 via TOPICAL
  Filled 2018-05-30: qty 1

## 2018-05-30 MED ORDER — CARBIDOPA-LEVODOPA 25-250 MG PO TABS
1.0000 | ORAL_TABLET | Freq: Three times a day (TID) | ORAL | Status: DC
  Administered 2018-05-30 – 2018-06-01 (×4): 1 via ORAL
  Filled 2018-05-30 (×7): qty 1

## 2018-05-30 MED ORDER — METFORMIN HCL 500 MG PO TABS
1000.0000 mg | ORAL_TABLET | Freq: Two times a day (BID) | ORAL | Status: DC
  Administered 2018-05-31: 09:00:00 1000 mg via ORAL
  Filled 2018-05-30 (×2): qty 2

## 2018-05-30 MED ORDER — ENOXAPARIN SODIUM 40 MG/0.4ML ~~LOC~~ SOLN
40.0000 mg | SUBCUTANEOUS | Status: DC
  Administered 2018-05-30: 40 mg via SUBCUTANEOUS
  Filled 2018-05-30 (×2): qty 0.4

## 2018-05-30 MED ORDER — FLUDROCORTISONE ACETATE 0.1 MG PO TABS
0.3000 mg | ORAL_TABLET | Freq: Every day | ORAL | Status: DC
  Administered 2018-05-31: 0.3 mg via ORAL
  Filled 2018-05-30 (×2): qty 3

## 2018-05-30 MED ORDER — LEVETIRACETAM 500 MG PO TABS
500.0000 mg | ORAL_TABLET | Freq: Two times a day (BID) | ORAL | Status: DC
  Administered 2018-05-30 – 2018-05-31 (×3): 500 mg via ORAL
  Filled 2018-05-30 (×5): qty 1

## 2018-05-30 MED ORDER — ISOSORBIDE MONONITRATE 10 MG PO TABS
5.0000 mg | ORAL_TABLET | Freq: Every day | ORAL | Status: DC
  Administered 2018-05-30: 22:00:00 5 mg via ORAL
  Filled 2018-05-30 (×3): qty 0.5

## 2018-05-30 MED ORDER — PANTOPRAZOLE SODIUM 40 MG PO TBEC
40.0000 mg | DELAYED_RELEASE_TABLET | Freq: Every day | ORAL | Status: DC
  Administered 2018-05-31: 40 mg via ORAL
  Filled 2018-05-30 (×2): qty 1

## 2018-05-30 MED ORDER — AMIODARONE HCL 200 MG PO TABS
100.0000 mg | ORAL_TABLET | Freq: Every day | ORAL | Status: DC
  Administered 2018-05-31: 09:00:00 100 mg via ORAL
  Filled 2018-05-30 (×2): qty 1

## 2018-05-30 MED ORDER — TRAMADOL HCL 50 MG PO TABS
50.0000 mg | ORAL_TABLET | Freq: Four times a day (QID) | ORAL | Status: DC | PRN
  Administered 2018-05-31 (×2): 50 mg via ORAL
  Filled 2018-05-30 (×2): qty 1

## 2018-05-30 MED ORDER — ATORVASTATIN CALCIUM 20 MG PO TABS
80.0000 mg | ORAL_TABLET | Freq: Every day | ORAL | Status: DC
  Administered 2018-05-30: 22:00:00 80 mg via ORAL
  Filled 2018-05-30 (×2): qty 4

## 2018-05-30 MED ORDER — TIMOLOL MALEATE 0.5 % OP SOLN
1.0000 [drp] | Freq: Two times a day (BID) | OPHTHALMIC | Status: DC
  Administered 2018-05-30 – 2018-06-01 (×3): 1 [drp] via OPHTHALMIC
  Filled 2018-05-30: qty 5

## 2018-05-30 MED ORDER — INSULIN GLARGINE 100 UNIT/ML ~~LOC~~ SOLN
20.0000 [IU] | Freq: Every morning | SUBCUTANEOUS | Status: DC
  Administered 2018-05-31 – 2018-06-01 (×2): 20 [IU] via SUBCUTANEOUS
  Filled 2018-05-30 (×2): qty 0.2

## 2018-05-30 MED ORDER — ONDANSETRON HCL 4 MG PO TABS: 4.0000 mg | ORAL_TABLET | Freq: Four times a day (QID) | ORAL | Status: DC | PRN

## 2018-05-30 MED ORDER — MAGNESIUM OXIDE 400 (241.3 MG) MG PO TABS
400.0000 mg | ORAL_TABLET | Freq: Every day | ORAL | Status: DC
  Administered 2018-05-31: 400 mg via ORAL
  Filled 2018-05-30 (×2): qty 1

## 2018-05-30 MED ORDER — LOPERAMIDE HCL 2 MG PO CAPS: 2.0000 mg | ORAL_CAPSULE | Freq: Four times a day (QID) | ORAL | Status: DC | PRN

## 2018-05-30 MED ORDER — ACETAMINOPHEN 650 MG RE SUPP: 650.0000 mg | Freq: Four times a day (QID) | RECTAL | Status: DC | PRN

## 2018-05-30 MED ORDER — VANCOMYCIN HCL 10 G IV SOLR
2000.0000 mg | Freq: Once | INTRAVENOUS | Status: AC
  Administered 2018-05-30: 2000 mg via INTRAVENOUS
  Filled 2018-05-30: qty 2000

## 2018-05-30 MED ORDER — HYPROMELLOSE (GONIOSCOPIC) 2.5 % OP SOLN: 1.0000 [drp] | Freq: Three times a day (TID) | OPHTHALMIC | Status: DC

## 2018-05-30 MED ORDER — SODIUM CHLORIDE 0.9 % IV BOLUS
500.0000 mL | Freq: Once | INTRAVENOUS | Status: AC
  Administered 2018-05-30: 500 mL via INTRAVENOUS

## 2018-05-30 MED ORDER — PIPERACILLIN-TAZOBACTAM 3.375 G IVPB 30 MIN
3.3750 g | Freq: Once | INTRAVENOUS | Status: AC
  Administered 2018-05-30: 3.375 g via INTRAVENOUS
  Filled 2018-05-30: qty 50

## 2018-05-30 MED ORDER — ACETAMINOPHEN 325 MG PO TABS: 650.0000 mg | ORAL_TABLET | Freq: Four times a day (QID) | ORAL | Status: DC | PRN

## 2018-05-30 MED ORDER — BRIMONIDINE TARTRATE 0.2 % OP SOLN
1.0000 [drp] | Freq: Two times a day (BID) | OPHTHALMIC | Status: DC
  Administered 2018-05-31 – 2018-06-01 (×2): 1 [drp] via OPHTHALMIC
  Filled 2018-05-30: qty 5

## 2018-05-30 MED ORDER — VITAMIN B-12 1000 MCG PO TABS
1000.0000 ug | ORAL_TABLET | Freq: Every day | ORAL | Status: DC
  Administered 2018-05-31: 09:00:00 1000 ug via ORAL
  Filled 2018-05-30 (×2): qty 1

## 2018-05-30 MED ORDER — CARBOXYMETHYLCELLUL-GLYCERIN 0.5-0.9 % OP SOLN: 1.0000 [drp] | Freq: Three times a day (TID) | OPHTHALMIC | Status: DC

## 2018-05-30 MED ORDER — CALCIUM CARBONATE-VITAMIN D 500-200 MG-UNIT PO TABS
1.0000 | ORAL_TABLET | Freq: Every day | ORAL | Status: DC
  Administered 2018-05-31: 09:00:00 1 via ORAL
  Filled 2018-05-30 (×3): qty 1

## 2018-05-30 MED ORDER — MIRABEGRON ER 50 MG PO TB24
50.0000 mg | ORAL_TABLET | Freq: Every day | ORAL | Status: DC
  Administered 2018-05-31: 09:00:00 50 mg via ORAL
  Filled 2018-05-30 (×2): qty 1

## 2018-05-30 MED ORDER — LIDOCAINE-PRILOCAINE 2.5-2.5 % EX CREA
TOPICAL_CREAM | Freq: Every day | CUTANEOUS | Status: DC | PRN
  Filled 2018-05-30: qty 5

## 2018-05-30 MED ORDER — LIDOCAINE 5 % EX PTCH
1.0000 | MEDICATED_PATCH | CUTANEOUS | Status: DC
  Administered 2018-05-30 – 2018-05-31 (×2): 1 via TRANSDERMAL
  Filled 2018-05-30 (×3): qty 1

## 2018-05-30 MED ORDER — ALBUTEROL SULFATE (2.5 MG/3ML) 0.083% IN NEBU: 2.5000 mg | INHALATION_SOLUTION | Freq: Four times a day (QID) | RESPIRATORY_TRACT | Status: DC | PRN

## 2018-05-30 MED ORDER — CARBIDOPA-LEVODOPA 25-250 MG PO TABS
2.0000 | ORAL_TABLET | Freq: Every day | ORAL | Status: DC
  Administered 2018-05-31 – 2018-06-01 (×2): 2 via ORAL
  Filled 2018-05-30 (×2): qty 2

## 2018-05-30 MED ORDER — METOPROLOL SUCCINATE ER 25 MG PO TB24
25.0000 mg | ORAL_TABLET | Freq: Every day | ORAL | Status: DC
  Filled 2018-05-30 (×2): qty 1

## 2018-05-30 MED ORDER — IOHEXOL 300 MG/ML  SOLN
100.0000 mL | Freq: Once | INTRAMUSCULAR | Status: AC | PRN
  Administered 2018-05-30: 100 mL via INTRAVENOUS

## 2018-05-30 MED ORDER — INSULIN ASPART 100 UNIT/ML ~~LOC~~ SOLN
0.0000 [IU] | Freq: Three times a day (TID) | SUBCUTANEOUS | Status: DC
  Administered 2018-05-31 – 2018-06-01 (×3): 2 [IU] via SUBCUTANEOUS
  Administered 2018-06-01: 13:00:00 1 [IU] via SUBCUTANEOUS
  Filled 2018-05-30 (×5): qty 1

## 2018-05-30 MED ORDER — SODIUM CHLORIDE 0.9 % IV BOLUS
1000.0000 mL | Freq: Once | INTRAVENOUS | Status: AC
  Administered 2018-05-30: 1000 mL via INTRAVENOUS

## 2018-05-30 MED ORDER — INSULIN ASPART 100 UNIT/ML ~~LOC~~ SOLN: 0.0000 [IU] | Freq: Every day | SUBCUTANEOUS | Status: DC

## 2018-05-30 MED ORDER — MOMETASONE FURO-FORMOTEROL FUM 100-5 MCG/ACT IN AERO
2.0000 | INHALATION_SPRAY | Freq: Two times a day (BID) | RESPIRATORY_TRACT | Status: DC
  Administered 2018-05-30 – 2018-06-01 (×3): 2 via RESPIRATORY_TRACT
  Filled 2018-05-30: qty 8.8

## 2018-05-30 MED ORDER — POLYVINYL ALCOHOL 1.4 % OP SOLN
1.0000 [drp] | Freq: Three times a day (TID) | OPHTHALMIC | Status: DC
  Administered 2018-05-31: 1 [drp] via OPHTHALMIC
  Filled 2018-05-30: qty 15

## 2018-05-30 MED ORDER — ONDANSETRON HCL 4 MG/2ML IJ SOLN: 4.0000 mg | Freq: Four times a day (QID) | INTRAMUSCULAR | Status: DC | PRN

## 2018-05-30 NOTE — ED Triage Notes (Signed)
Pt arrived via ems from home. Home health nurse went in and found the pt on the floor covered in loose watery stool Pt remembers walking to the bathroom but does not remember how he ended up on the floor. Pt also c/o abdominal pain. Pt has hx of liver CA. Pt did not receive treatment on Friday due to low platelets, he later received treatment for low platelets here at this facility. Pt NAD at present.

## 2018-05-30 NOTE — ED Notes (Signed)
Patient transported to X-ray and then CT at this time.

## 2018-05-30 NOTE — ED Notes (Signed)
Urine sent at this time.

## 2018-05-30 NOTE — ED Provider Notes (Signed)
[facility] Emergency Department Provider Note  ____________________________________________  Time seen: Approximately 1:26 PM  I have reviewed the triage vital signs and the nursing notes.   HISTORY  Chief Complaint Near Syncope    HPI Jimmy Fortin. is a 78 y.o. 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  The patient is treated with Florinef for orthostatic hypotension.  The patient reports that he lives independently, and was ambulating to the bathroom and then had a syncopal episode; he does not remember what happened.  He did not have any preceding symptoms he states he has been having some suprapubic abdominal pain without any dysuria or urinary frequency for the past several days.  He has not had any cough or cold symptoms, fevers or chills.  He states he has been eating and drinking normally.  He did have a large episode of diarrhea during his syncope.  At this time, the patient denies any focal pain.  I have reviewed the patient's chart, and he was seen here 05/28/2018 after presenting to the oncology clinic for chemotherapy, but sent to the ED prior to treatment due to hypotension and confusion.  He was seen by his pace physician who felt he was at his mental status baseline, and he did not have hypotension here; he did receive some IV fluids for likely dehydration.    Past Medical History:  Diagnosis Date  . Allergic rhinitis   . Anginal pain ([place])   . Arthritis   . Autonomic dysreflexia   . Back pain    CHRONIC LBP,STENOSIS  . CHF (congestive heart failure) ([place])   . Chronic kidney disease    STAGE 3  . COPD (chronic obstructive pulmonary disease) ([place])   . Coronary artery disease   . Depression   . Diabetes ([place])   . Diabetes mellitus without complication ([place])   . DJD (degenerative joint disease)   . Dysphagia   . Dysrhythmia    A-fib, now converted  . Edema    FEET/ANKLES  . GERD  (gastroesophageal reflux disease)   . Glaucoma   . H/O orthostatic hypotension   . Hypercholesterolemia   . Myocardial infarction ([place]) 2000  . Parkinson's disease ([place])   . Peripheral vascular disease ([place])   . Pneumonia    history of   . Polyneuropathy   . RLS (restless legs syndrome)   . Seizures ([place])    when in rehab for his hip  . Sleep apnea   . Smoker   . Spinal stenosis   . Strabismic amblyopia   . Tinnitus     Patient Active Problem List   Diagnosis Date Noted  . Encounter for antineoplastic chemotherapy 04/30/2018  . Dehydration 04/30/2018  . Cholangiocarcinoma ([place]) 04/20/2018  . Cancer-related pain 03/18/2018  . Goals of care, counseling/discussion 03/18/2018  . Iron deficiency 03/02/2018  . Liver lesion 01/22/2018  . Acute exacerbation of chronic obstructive pulmonary disease (COPD) ([place]) 09/23/2017  . Sepsis ([place]) 05/01/2017  . Syncope 04/30/2017  . New onset atrial fibrillation ([place]) 07/03/2016  . Loss of weight   . Gastritis without bleeding   . Occult GI bleeding   . GI bleed 03/02/2016  . Left-sided weakness 03/02/2016  . Rectal bleeding 01/16/2016  . Colitis presumed infectious 01/10/2016  . Pressure ulcer 12/29/2015  . Altered mental status 12/28/2015  . Fracture of femoral neck, left ([place]) 12/03/2015  . CAD (coronary artery disease) 12/03/2015  . Sleep apnea 12/03/2015  .  Femoral neck fracture, left, closed, initial encounter 12/03/2015  . Unstable angina ([place]) 05/14/2015  . COPD (chronic obstructive pulmonary disease) ([place]) 02/05/2015  . Acute bronchitis 02/05/2015  . Type 2 diabetes mellitus ([place]) 02/05/2015  . Chest pain 12/09/2014  . Presence of drug coated stent in LAD coronary artery 07/11/2013  . Disease characterized by destruction of skeletal muscle 09/12/2009  . Encephalopathy 09/12/2009  . Vitamin D deficiency 05/05/2008  . Polyneuropathy in diabetes ([place]) 08/17/2005  . Spinal stenosis in cervical region 03/20/2005  . Essential  tremor 01/17/2005  . Hypercholesterolemia 03/21/2004  . Hypertension, benign 01/08/2004  . 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 Localized osteoarthrosis, lower leg 04/07/2002  . Parkinson's disease ([place]) 09/21/2000  . Peptic ulcer with hemorrhage and perforation ([place]) 04/19/1996  . 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 ANKLE SURGERY    . APPENDECTOMY    . BACK SURGERY    . CATARACT EXTRACTION W/PHACO Left 08/26/2016   Procedure: CATARACT EXTRACTION PHACO AND INTRAOCULAR LENS PLACEMENT (IOC);  Surgeon: Birder Robson, MD;  Location: [facility] ORS;  Service: Ophthalmology;  Laterality: Left;  [place] 01:37 AP% 19.4 CDE 18.97 fluid pack lot # [id number]  . CORONARY ANGIOPLASTY     STENT  . CTR    . ESOPHAGOGASTRODUODENOSCOPY (EGD) WITH PROPOFOL N/A 03/04/2016   Procedure: ESOPHAGOGASTRODUODENOSCOPY (EGD) WITH PROPOFOL;  Surgeon: [name], MD;  Location: [facility] ENDOSCOPY;  Service: Endoscopy;  Laterality: N/A;  . EYE SURGERY     cataract  . FRACTURE SURGERY    . HIP PINNING,CANNULATED Left 12/04/2015   Procedure: CANNULATED HIP PINNING;  Surgeon: Thornton Park, MD;  Location: [facility] ORS;  Service: Orthopedics;  Laterality: Left;  . PORTA CATH INSERTION N/A 04/28/2018   Procedure: PORTA CATH INSERTION;  Surgeon: Katha Cabal, MD;  Location: [place] [facility];  Service: Cardiovascular;  Laterality: N/A;    Current Outpatient Rx  . Order #: [id number] Class: Historical Med  . Order #: [id number] Class: Historical Med  . Order #: [id number] Class: Historical Med  . Order #: [id number] Class: Historical Med  . Order #: [id number] Class: Historical Med  . Order #: [id number] Class: Historical Med  . Order #: [id number] Class: Historical Med  . Order #: [id number] Class: Historical Med  . Order #: [id number] Class: Historical Med  . Order #: [id number] Class: Normal  . Order #: [id number] Class: Historical Med  . Order #: [id number] Class:  Historical Med  . Order #: [id number] Class: Historical Med  . Order #: [id number] Class: Historical Med  . Order #: [id number] Class: Historical Med  . Order #: [id number] Class: Historical Med  . Order #: [id number] Class: Historical Med  . Order #: [id number] Class: Print  . Order #: [id number] Class: Historical Med  . Order #: [id number] Class: Historical Med  . Order #: [id number] Class: Normal  . Order #: [id number] Class: Historical Med  . Order #: [id number] Class: Historical Med  . Order #: [id number] Class: Historical Med  . Order #: [id number] Class: Historical Med  . Order #: [id number] Class: Historical Med  . Order #: [id number] Class: Historical Med  . Order #: [id number] Class: Historical Med  . Order #: [id number] Class: Historical Med  . Order #: [id number] Class: Normal  . Order #: [id number] Class: Historical Med  . Order #: [id number] Class: Historical Med  . Order #: [id number] Class: Historical Med  . Order #: [id number] Class: Historical Med  . Order #: [id number] Class: Normal  . Order #: [id number] Class: Historical Med  . Order #: [id number] Class: Historical Med  . Order #: [id number] Class: Historical Med  . Order #:  [id number] Class: Historical Med    Allergies Bupropion; Bactrim [sulfamethoxazole-trimethoprim]; and Cheese  Family History  Problem Relation Age of Onset  . Heart disease Father   . Diabetes Mellitus II Other     Social History Social History   Tobacco Use  . Smoking status: Former Smoker    Packs/day: 0.50    Types: Cigarettes    Last attempt to quit: 11/17/2015    Years since quitting: 2.5  . Smokeless tobacco: Never Used  Substance Use Topics  . Alcohol use: No  . Drug use: No    Review of Systems Constitutional: No fever/chills.  No lightheadedness or general malaise.  Positive syncope. Eyes: No visual changes.  No blurred or double vision. ENT: No sore throat. No congestion or rhinorrhea. Cardiovascular: Denies chest pain. Denies palpitations. Respiratory: Denies  shortness of breath.  No cough. Gastrointestinal: Positive suprapubic abdominal pain.  No nausea, no vomiting.  Positive diarrhea.  No constipation. Genitourinary: Negative for dysuria.  No urinary frequency. Musculoskeletal: Negative for back pain. Skin: Negative for rash. Neurological: Negative for headaches. No focal numbness, tingling or weakness.     ____________________________________________   PHYSICAL EXAM:  VITAL SIGNS: ED Triage Vitals [05/30/18 1308]  Enc Vitals Group     BP (!) 92/51     Pulse Rate (!) 101     Resp 18     Temp 97.8 F (36.6 C)     Temp Source Oral     SpO2 95 %     Weight 180 lb 9.6 oz (81.9 kg)     Height 5\' 8"  (1.727 m)     Head Circumference      Peak Flow      Pain Score 5     Pain Loc      Pain Edu?      Excl. in [place]?     Constitutional: Alert and oriented. Answers questions appropriately.  The patient is chronically ill-appearing. Eyes: Conjunctivae are normal.  EOMI. No scleral icterus. Head: Atraumatic. Nose: No congestion/rhinnorhea. Mouth/Throat: Mucous membranes are mildly dry.  Neck: No stridor.  Supple.  No JVD.  No meningismus.  No midline C-spine tenderness to palpation, step-offs or deformities. Cardiovascular: Normal rate, regular rhythm. No murmurs, rubs or gallops.  The patient has a port accessed in the right upper chest without any evidence of surrounding erythema, fluctuance or discharge. Respiratory: Normal respiratory effort.  No accessory muscle use or retractions. Lungs CTAB.  No wheezes, rales or ronchi. Gastrointestinal: Obese.  Soft, nontender and nondistended.  No guarding or rebound.  No peritoneal signs. Musculoskeletal: No LE edema. No ttp in the calves or palpable cords.  Negative Homan's sign. Neurologic:  A&Ox3.  Speech is clear.  Face and smile are symmetric.  EOMI.  Moves all extremities well. Skin: The patient has multiple small skin tears over the left elbow but full range of motion of the elbow.  He has  a skinned knee on the left side.  He has multiple old scabs on the right knee. Psychiatric: Mood and affect are normal.   ____________________________________________   LABS (all labs ordered are listed, but only abnormal results are displayed)  Labs Reviewed  C DIFFICILE QUICK SCREEN W PCR REFLEX  GASTROINTESTINAL PANEL BY PCR, STOOL (REPLACES STOOL CULTURE)  CULTURE, BLOOD (ROUTINE X 2)  CULTURE, BLOOD (ROUTINE X 2)  CBC  COMPREHENSIVE METABOLIC PANEL  LIPASE, BLOOD  URINALYSIS, COMPLETE (UACMP) WITH MICROSCOPIC  TROPONIN I  LACTIC ACID, PLASMA  LACTIC ACID, PLASMA  ____________________________________________  EKG  ED ECG REPORT I, Anne-Caroline Mariea Clonts, the attending physician, personally viewed and interpreted this ECG.   Date: 05/30/2018  EKG Time: 1352  Rate: 88  Rhythm: normal sinus rhythm  Axis: normal  Intervals:prolonged QTc and first degree block  ST&T Change: No STEMI  ____________________________________________  RADIOLOGY  No results found.  ____________________________________________   PROCEDURES  Procedure(s) performed: None  Procedures  Critical Care performed: Yes, see critical care note(s) ____________________________________________   INITIAL IMPRESSION / ASSESSMENT AND PLAN / ED COURSE  Pertinent labs & imaging results that were available during my care of the patient were reviewed by me and considered in my medical decision making (see chart for details).  78 y.o. male with cholangiocarcinoma and multiple other chronic comorbidities, including orthostatic hypotension, presenting with a syncopal episode.  Overall, I am concerned that the patient is hypotensive to 92/51 with a heart rate of 101.  He may be dehydrated and we will look for signs of this.  Will check electrolyte abnormalities, arrhythmias or ischemia, and look for evidence of infection including blood cultures, UA and chest x-ray.  Given his recent history of abdominal  pain, will get a CT scan for further evaluation.  The patient will receive intravenous fluids.  Do not see any evidence of trauma, but will get a CT of the head for full evaluation.  Plan reevaluation for final disposition.  ----------------------------------------- 2:43 PM on 05/30/2018 -----------------------------------------  Patient has received intravenous fluids and his blood pressure and heart rate have normalized at this time.  He does have an elevated lactic acid of greater than 4.  He has been given vancomycin and Zosyn empirically.  The CT of the patient's head shows no acute intracranial findings.  I am awaiting the results of the remainder of his laboratory studies and will plan to admit the patient for continued evaluation and treatment  CRITICAL CARE Performed by: Eula Listen   Total critical care time: 35 minutes  Critical care time was exclusive of separately billable procedures and treating other patients.  Critical care was necessary to treat or prevent imminent or life-threatening deterioration.  Critical care was time spent personally by me on the following activities: development of treatment plan with patient and/or surrogate as well as nursing, discussions with consultants, evaluation of patient's response to treatment, examination of patient, obtaining history from patient or surrogate, ordering and performing treatments and interventions, ordering and review of laboratory studies, ordering and review of radiographic studies, pulse oximetry and re-evaluation of patient's condition.   ____________________________________________  FINAL CLINICAL IMPRESSION(S) / ED DIAGNOSES  Final diagnoses:  Syncope, unspecified syncope type  Diarrhea, unspecified type         NEW MEDICATIONS STARTED DURING THIS VISIT:  New Prescriptions   No medications on file      Eula Listen, MD 05/30/18 1445

## 2018-05-30 NOTE — H&P (Signed)
[facility] NAME: Cree Kunert    MR#:  [id number]  DATE OF BIRTH:  Jul 28, 1940  DATE OF ADMISSION:  05/30/2018  PRIMARY CARE PHYSICIAN: [facility], [place]   REQUESTING/REFERRING PHYSICIAN: Dr.Anne-Caroline Mariea Clonts  CHIEF COMPLAINT:   Chief Complaint  Patient presents with  . Near Syncope    HISTORY OF PRESENT ILLNESS:  Glendel Jaggers  is a 78 y.o. male with a known history of intrahepatic cholangiocarcinoma and chemotherapy, Parkinson's disease, autonomic dysreflexia with orthostatic hypotension, CKD, COPD not on home oxygen, diabetes presents to hospital secondary to a syncopal episode. Patient is a poor historian, stays at home by himself and is followed by [facility].  Patient has been feeling weak for a few days now.  2 days ago he was sent to the ER from cancer center secondary to hypotension.  He was seen in the ED, no infection was identified.  Patient has stopped taking his Florinef, and so was discharged home and advised to take his medications.  His primary care physician from [facility] was presented here and reassured that patient was at his baseline and he was discharged home.  Today patient remembers about 3 to 4 hours ago that he was walking to the bathroom, the next thing he remembers he was on the floor, feces surrounding him.  Does not remember how long he was down.  Denies any history of seizures.  Denies any chest pain or palpitations.  No confusion noted afterwards.  Found by his home health nurse and so brought to the emergency room. CT of the head is negative.  Noted to be hypotensive again.  PAST MEDICAL HISTORY:   Past Medical History:  Diagnosis Date  . Allergic rhinitis   . Anginal pain ([place])   . Arthritis   . Autonomic dysreflexia   . Back pain    CHRONIC LBP,STENOSIS  . CHF (congestive heart failure) ([place])   . Chronic kidney disease    STAGE 3  . COPD  (chronic obstructive pulmonary disease) ([place])   . Coronary artery disease   . Depression   . Diabetes ([place])   . Diabetes mellitus without complication ([place])   . DJD (degenerative joint disease)   . Dysphagia   . Dysrhythmia    A-fib, now converted  . Edema    FEET/ANKLES  . GERD (gastroesophageal reflux disease)   . Glaucoma   . H/O orthostatic hypotension   . Hypercholesterolemia   . Myocardial infarction ([place]) 2000  . Parkinson's disease ([place])   . Peripheral vascular disease ([place])   . Pneumonia    history of   . Polyneuropathy   . RLS (restless legs syndrome)   . Seizures ([place])    when in rehab for his hip  . Sleep apnea   . Smoker   . Spinal stenosis   . Strabismic amblyopia   . Tinnitus     PAST SURGICAL HISTORY:   Past Surgical History:  Procedure Laterality Date  . ANKLE SURGERY    . APPENDECTOMY    . BACK SURGERY    . CATARACT EXTRACTION W/PHACO Left 08/26/2016   Procedure: CATARACT EXTRACTION PHACO AND INTRAOCULAR LENS PLACEMENT (IOC);  Surgeon: Birder Robson, MD;  Location: [facility] ORS;  Service: Ophthalmology;  Laterality: Left;  [place] 01:37 AP% 19.4 CDE 18.97 fluid pack lot # [id number]  . CORONARY ANGIOPLASTY     STENT  . CTR    .  ESOPHAGOGASTRODUODENOSCOPY (EGD) WITH PROPOFOL N/A 03/04/2016   Procedure: ESOPHAGOGASTRODUODENOSCOPY (EGD) WITH PROPOFOL;  Surgeon: [name], MD;  Location: [facility] ENDOSCOPY;  Service: Endoscopy;  Laterality: N/A;  . EYE SURGERY     cataract  . FRACTURE SURGERY    . HIP PINNING,CANNULATED Left 12/04/2015   Procedure: CANNULATED HIP PINNING;  Surgeon: Thornton Park, MD;  Location: [facility] ORS;  Service: Orthopedics;  Laterality: Left;  . PORTA CATH INSERTION N/A 04/28/2018   Procedure: PORTA CATH INSERTION;  Surgeon: Katha Cabal, MD;  Location: [facility];  Service: Cardiovascular;  Laterality: N/A;    SOCIAL HISTORY:   Social History   Tobacco Use  . Smoking status: Former Smoker    Packs/day: 0.50     Types: Cigarettes    Last attempt to quit: 11/17/2015    Years since quitting: 2.5  . Smokeless tobacco: Never Used  Substance Use Topics  . Alcohol use: No    FAMILY HISTORY:   Family History  Problem Relation Age of Onset  . Heart disease Father   . Diabetes Mellitus II Other     DRUG ALLERGIES:   Allergies  Allergen Reactions  . Bupropion Other (See Comments)    Seizure  . Bactrim [Sulfamethoxazole-Trimethoprim] Other (See Comments)  . Cheese     Patient doesn't want any cheese    REVIEW OF SYSTEMS:   Review of Systems  Constitutional: Positive for malaise/fatigue. Negative for chills, fever and weight loss.  HENT: Negative for ear discharge, ear pain, hearing loss and nosebleeds.   Eyes: Negative for blurred vision, double vision and photophobia.  Respiratory: Negative for cough, hemoptysis, shortness of breath and wheezing.   Cardiovascular: Negative for chest pain, palpitations, orthopnea and leg swelling.  Gastrointestinal: Positive for diarrhea. Negative for abdominal pain, constipation, heartburn, melena, nausea and vomiting.  Genitourinary: Negative for dysuria, frequency, hematuria and urgency.  Musculoskeletal: Negative for back pain, myalgias and neck pain.  Skin: Negative for rash.  Neurological: Positive for dizziness. Negative for tremors, sensory change, speech change, focal weakness and headaches.       Syncope   Endo/Heme/Allergies: Does not bruise/bleed easily.  Psychiatric/Behavioral: Negative for depression.    MEDICATIONS AT HOME:   Prior to Admission medications   Medication Sig Start Date End Date Taking? Authorizing Provider  acetaminophen (TYLENOL) 325 MG tablet Take 650 mg by mouth every 6 (six) hours as needed.    [provider]  albuterol (PROVENTIL HFA;VENTOLIN HFA) 108 (90 Base) MCG/ACT inhaler Inhale into the lungs every 6 (six) hours as needed for wheezing or shortness of breath.    [provider]  albuterol  (PROVENTIL) (2.5 MG/3ML) 0.083% nebulizer solution Take 2.5 mg by nebulization 4 (four) times daily as needed for wheezing or shortness of breath.    [provider]  amiodarone (PACERONE) 100 MG tablet Take 100 mg by mouth daily.    [provider]  atorvastatin (LIPITOR) 80 MG tablet Take 80 mg by mouth at bedtime.    [provider]  brimonidine-timolol (COMBIGAN) 0.2-0.5 % ophthalmic solution Place 1 drop into both eyes 2 (two) times daily.    [provider]  Calcium Carbonate-Vitamin D (CALCIUM 600+D PO) Take 1 tablet by mouth daily.    [provider]  carbidopa-levodopa (SINEMET IR) 25-250 MG tablet Take 2 tablets by mouth every morning (8AM), 1 tablet at noon, 1 tablet every afternoon (430PM) and 1 tablet at bedtime (10PM)    [provider]  Carboxymethylcellul-Glycerin (REFRESH  OPTIVE OP) Apply 2 drops to eye every 8 (eight) hours as needed (dry eyes).    [provider]  dexamethasone (DECADRON) 4 MG tablet Take 2 tablets by mouth once a day on the day after chemotherapy and then take 1 tablet two times a day for 2 days. Take with food. 04/27/18   Lequita Asal, MD  Dextromethorphan-Guaifenesin (TUSSIN DM) 10-100 MG/5ML liquid Take 5 mLs by mouth every 6 (six) hours as needed (cough).     [provider]  dextrose (GLUTOSE 15) 40 % GEL Take 1 Tube by mouth. For blood sugar less than 70, repeat in 5 minutes up to 2 doses     [provider]  ferrous sulfate 325 (65 FE) MG tablet Take 325 mg by mouth daily with breakfast.    [provider]  FLUDROCORTISONE ACETATE PO Take 0.1 mg by mouth daily.    [provider]  ibuprofen (ADVIL,MOTRIN) 200 MG tablet Take 400 mg by mouth every 6 (six) hours as needed.    [provider]  insulin glargine (LANTUS) 100 UNIT/ML injection Inject 20 Units into the skin every morning.     Gareth Morgan, MD  isosorbide mononitrate (ISMO,MONOKET) 10  MG tablet Take 5 mg by mouth at bedtime.     [provider]  Lenvatinib 12 mg daily dose (LENVIMA) 3 x 4 MG capsule Take 12 mg by mouth daily. 03/23/18   Karen Kitchens, NP  levETIRAcetam (KEPPRA) 500 MG tablet Take 500 mg by mouth 2 (two) times daily.    [provider]  lidocaine (LIDODERM) 5 % Place 1 patch onto the skin daily. Remove & Discard patch within 12 hours or as directed by MD    [provider]  lidocaine-prilocaine (EMLA) cream Apply to affected area once 04/27/18   Lequita Asal, MD  loperamide (IMODIUM) 2 MG capsule Take by mouth as needed for diarrhea or loose stools.    [provider]  magnesium oxide (MAG-OX) 400 MG tablet Take 400 mg by mouth daily.    [provider]  metFORMIN (GLUCOPHAGE) 1000 MG tablet Take 1,000 mg by mouth 2 (two) times daily with a meal.     Gareth Morgan, MD  metoprolol succinate (TOPROL-XL) 25 MG 24 hr tablet Take 25 mg by mouth daily.    [provider]  mirabegron ER (MYRBETRIQ) 50 MG TB24 tablet Take 50 mg by mouth daily.    [provider]  mometasone-formoterol (DULERA) 100-5 MCG/ACT AERO Inhale 2 puffs into the lungs 2 (two) times daily.    [provider]  naloxone [facility]) nasal spray 4 mg/0.1 mL Place 1 spray into the nose.    [provider]  nitroGLYCERIN (NITROSTAT) 0.4 MG SL tablet Place 0.4 mg under the tongue every 5 (five) minutes as needed for chest pain.    [provider]  ondansetron (ZOFRAN) 8 MG tablet Take 1 tablet (8 mg total) by mouth 2 (two) times daily as needed. Start on the third day after chemotherapy. 04/27/18   Lequita Asal, MD  Oxycodone HCl 10 MG TABS Take 10 mg by mouth 3 (three) times daily as needed.     [provider]  pantoprazole (PROTONIX) 40 MG tablet Take 40 mg by mouth daily.    [provider]  polyethylene glycol (MIRALAX / GLYCOLAX) packet Take 17 g by mouth daily.    [provider]  potassium chloride (K-DUR,KLOR-CON) 10 MEQ tablet  Take 40 mEq by mouth daily.     [provider]  prochlorperazine (COMPAZINE) 10 MG tablet Take 1 tablet (10 mg total) by mouth every 6 (six) hours as needed (Nausea or vomiting). 04/27/18   Lequita Asal, MD  senna-docusate (SENOKOT-S) 8.6-50 MG tablet Take 2 tablets by mouth at bedtime as needed.     [provider]  traMADol (ULTRAM) 50 MG tablet Take 50 mg by mouth every 6 (six) hours as needed for moderate pain.    [provider]  vitamin B-12 (CYANOCOBALAMIN) 1000 MCG tablet Take 1,000 mcg by mouth daily.    [provider]  Vitamins A & D (VITAMIN A & D) ointment Apply 1 application topically as needed for dry skin.    [provider]      VITAL SIGNS:  Blood pressure (!) 92/51, pulse (!) 101, temperature 97.8 F (36.6 C), temperature source Oral, resp. rate 18, height 5\' 8"  (1.727 m), weight 81.9 kg, SpO2 95 %.  PHYSICAL EXAMINATION:   Physical Exam  GENERAL:  78 y.o.-year-old patient lying in the bed with no acute distress.  EYES: Pupils equal, round, reactive to light and accommodation. No scleral icterus. Extraocular muscles intact.  HEENT: Head atraumatic, normocephalic. Oropharynx and nasopharynx clear.  NECK:  Supple, no jugular venous distention. No thyroid enlargement, no tenderness.  LUNGS: Normal breath sounds bilaterally, no wheezing, rales,rhonchi or crepitation. No use of accessory muscles of respiration. Decreased bibasilar breath sounds heard CARDIOVASCULAR: S1, S2 normal. No murmurs, rubs, or gallops.  ABDOMEN: Soft, nontender, nondistended. Bowel sounds present. No organomegaly or mass.  EXTREMITIES: No pedal edema, cyanosis, or clubbing.  NEUROLOGIC: Cranial nerves II through XII are intact. Muscle strength 5/5 in all extremities. Sensation intact. Gait not checked.  PSYCHIATRIC: The patient is alert and oriented x 2-3.  SKIN: No obvious rash,  lesion, or ulcer. Skin tears with bleeding noted on the legs, feet and elbows. Bruising noted on all the extremities  LABORATORY PANEL:   CBC Recent Labs  Lab 05/30/18 1353  WBC 7.7  HGB 15.5  HCT 47.0  PLT 242   ------------------------------------------------------------------------------------------------------------------  Chemistries  Recent Labs  Lab 05/28/18 0822 05/30/18 1353  NA 135 138  K 5.3* 4.2  CL 96* 96*  CO2 22 30  GLUCOSE 319* 121*  BUN 34* 25*  CREATININE 1.39* 0.93  CALCIUM 8.3* 8.7*  MG 1.3*  --   AST 30 31  ALT 21 24  ALKPHOS 104 128*  BILITOT 0.6 0.7   ------------------------------------------------------------------------------------------------------------------  Cardiac Enzymes Recent Labs  Lab 05/30/18 1353  TROPONINI 0.03*   ------------------------------------------------------------------------------------------------------------------  RADIOLOGY:  Dg Chest 2 View  Result Date: 05/30/2018 CLINICAL DATA:  Syncope. EXAM: CHEST - 2 VIEW COMPARISON:  Chest x-ray dated May 21, 2018. FINDINGS: Unchanged right chest wall port catheter. The heart size and mediastinal contours are within normal limits. Normal pulmonary vascularity. No focal consolidation, pleural effusion, or pneumothorax. IMPRESSION: No active cardiopulmonary disease. Electronically Signed   By: Titus Dubin M.D.   On: 05/30/2018 15:45   Ct Head Wo Contrast  Result Date: 05/30/2018 CLINICAL DATA:  Found down. Abdominal pain. History of liver cancer and thrombocytopenia. EXAM: CT HEAD WITHOUT CONTRAST TECHNIQUE: Contiguous axial images were obtained from the base of the skull through the vertex without intravenous contrast. COMPARISON:  CT head 05/21/2018 and 12/16/2017. FINDINGS: Brain: There is no evidence of acute intracranial hemorrhage, mass lesion, brain edema or extra-axial fluid collection. There is stable atrophy with diffuse  prominence of the ventricles and  subarachnoid spaces. There are stable chronic small vessel ischemic changes in the periventricular and subcortical white matter. There is no CT evidence of acute cortical infarction. Vascular: Prominent intracranial vascular calcifications. No hyperdense vessel identified. Skull: Negative for fracture or focal lesion. Sinuses/Orbits: The visualized paranasal sinuses and mastoid air cells are clear. No orbital abnormalities are seen. Other: None. IMPRESSION: Stable examination without acute findings. Chronic atrophy and small vessel ischemic changes. Electronically Signed   By: Richardean Sale M.D.   On: 05/30/2018 14:30    EKG:   Orders placed or performed during the hospital encounter of 05/30/18  . ED EKG  . ED EKG    IMPRESSION AND PLAN:   Delvecchio Madole  is a 78 y.o. male with a known history of intrahepatic cholangiocarcinoma and chemotherapy, Parkinson's disease, autonomic dysreflexia with orthostatic hypotension, CKD, COPD not on home oxygen, diabetes presents to hospital secondary to a syncopal episode.  1.  Syncope-unsure if this was triggered by his hypotension from autonomic dysreflexia or other underlying causes -Admit, check orthostatics.  Echo and carotid Dopplers ordered. -Restart his Florinef. -CT of the head showing chronic findings but no acute changes. -IV fluids started - TSH ordered  2.  Lactic acidosis-always had elevated lactic acid.  Follow-up.  WBCs within normal limits.  No evidence of infection seen -Urine analysis is within normal limits.  Will hold off on antibiotics.  Received prophylactic vancomycin and Zosyn in the ED.  3.  Hypertension with underlying cardiac history-patient on low-dose Toprol, Imdur which we can list is elevated  4.  Chronic tremulousness and gait impairment-follows with a neurologist at [facility].  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  Continue levodopa.  Patient also takes Keppra -Last EEG as outpatient  showing generalized diffuse slowing.  Repeat EEG due to syncope of unknown origin at this time.  Continue to monitor on telemetry.  5.  Intrahepatic cholangiocarcinoma-follows with oncology as outpatient.  Currently on cycle 2 of chemotherapy.  Last 2 doses were held due to thrombocytopenia.  Platelets have recovered. -Outpatient follow-up recommended  6.  Diabetes mellitus-on metformin, Lantus.  Add sliding scale insulin.  7.  DVT prophylaxis-Lovenox  Physical therapy consulted    All the records are reviewed and case discussed with ED provider. Management plans discussed with the patient, family and they are in agreement.  CODE STATUS: Full Code  TOTAL TIME TAKING CARE OF THIS PATIENT: 51 minutes.    Gladstone Lighter M.D on 05/30/2018 at 3:51 PM  Between 7am to 6pm - Pager - [phone]  After 6pm go to [email or web address] - password [email or web address] [facility]  Office  [phone]  CC: Primary care physician; [facility], [place]

## 2018-05-30 NOTE — ED Notes (Signed)
Report attempted to be called at this time. Will attempt again.

## 2018-05-31 ENCOUNTER — Observation Stay
Admit: 2018-05-31 | Discharge: 2018-05-31 | Disposition: A | Discharge status: Home / Self Care | Payer: No Typology Code available for payment source | Attending: Internal Medicine | Admitting: Internal Medicine

## 2018-05-31 ENCOUNTER — Observation Stay: Payer: No Typology Code available for payment source

## 2018-05-31 DIAGNOSIS — R55 Syncope and collapse: Secondary | ICD-10-CM

## 2018-05-31 LAB — GLUCOSE, CAPILLARY
Glucose-Capillary: 156 mg/dL — ABNORMAL HIGH (ref 70–99)
Glucose-Capillary: 198 mg/dL — ABNORMAL HIGH (ref 70–99)
Glucose-Capillary: 143 mg/dL — ABNORMAL HIGH (ref 70–99)
Glucose-Capillary: 140 mg/dL — ABNORMAL HIGH (ref 70–99)

## 2018-05-31 LAB — GASTROINTESTINAL PANEL BY PCR, STOOL (REPLACES STOOL CULTURE)
Adenovirus F40/41: NOT DETECTED
Astrovirus: NOT DETECTED
CAMPYLOBACTER SPECIES: NOT DETECTED
Cryptosporidium: NOT DETECTED
Cyclospora cayetanensis: NOT DETECTED
ENTEROTOXIGENIC E COLI (ETEC): NOT DETECTED
Entamoeba histolytica: NOT DETECTED
Enteroaggregative E coli (EAEC): NOT DETECTED
Enteropathogenic E coli (EPEC): NOT DETECTED
Giardia lamblia: NOT DETECTED
Norovirus GI/GII: NOT DETECTED
Plesimonas shigelloides: NOT DETECTED
Rotavirus A: NOT DETECTED
SHIGA LIKE TOXIN PRODUCING E COLI (STEC): NOT DETECTED
Salmonella species: NOT DETECTED
Sapovirus (I, II, IV, and V): NOT DETECTED
Shigella/Enteroinvasive E coli (EIEC): NOT DETECTED
Vibrio cholerae: NOT DETECTED
Vibrio species: NOT DETECTED
Yersinia enterocolitica: NOT DETECTED

## 2018-05-31 LAB — CBC
HCT: 38.4 % — ABNORMAL LOW (ref 39.0–52.0)
Hemoglobin: 12.6 g/dL — ABNORMAL LOW (ref 13.0–17.0)
MCH: 29.2 pg (ref 26.0–34.0)
MCHC: 32.8 g/dL (ref 30.0–36.0)
MCV: 89.1 fL (ref 80.0–100.0)
Platelets: 184 10*3/uL (ref 150–400)
RBC: 4.31 MIL/uL (ref 4.22–5.81)
RDW: 13.9 % (ref 11.5–15.5)
WBC: 7.4 10*3/uL (ref 4.0–10.5)
nRBC: 0.3 % — ABNORMAL HIGH (ref 0.0–0.2)

## 2018-05-31 LAB — BASIC METABOLIC PANEL
Anion gap: 8 (ref 5–15)
BUN: 20 mg/dL (ref 8–23)
CO2: 29 mmol/L (ref 22–32)
Calcium: 7.7 mg/dL — ABNORMAL LOW (ref 8.9–10.3)
Chloride: 101 mmol/L (ref 98–111)
Creatinine, Ser: 0.89 mg/dL (ref 0.61–1.24)
GFR calc non Af Amer: >60 mL/min (ref >60)
Glucose, Bld: 140 mg/dL — ABNORMAL HIGH (ref 70–99)
Potassium: 3.6 mmol/L (ref 3.5–5.1)
Sodium: 138 mmol/L (ref 135–145)

## 2018-05-31 LAB — ECHOCARDIOGRAM COMPLETE
Height: 68 in
Weight: 2880 oz

## 2018-05-31 LAB — LACTIC ACID, PLASMA
Lactic Acid, Venous: 3.3 mmol/L (ref 0.5–1.9)
Lactic Acid, Venous: 2.2 mmol/L (ref 0.5–1.9)

## 2018-05-31 MED ORDER — FLUDROCORTISONE ACETATE 0.1 MG PO TABS: 0.3000 mg | ORAL_TABLET | Freq: Every day | ORAL | 0 refills | Status: AC

## 2018-05-31 NOTE — Care Management Note (Addendum)
Case Management Note  Patient Details  Name: Jimmy Hill. MRN: [id number] Date of Birth: 07-Jul-1940  Subjective/Objective:   Admitted to [facility] under observation status with the diagnosis of syncope. Lives alone. Daughter is [name] Helmes [phone]), [facility] years. States he [facility] to [facility] about 3 times a week.  Currently receiving nursing serves in the home. "[facility]" per patient. [employer] and [employer] in the past.  No home oxygen. Home Care Providers are in the home. Rolling walker, electric wheelchair, and scooter in the home. Aide helps with baths and dressing. Self feed.  [place] prior to this admission.                Action/Plan: Will continue to follow for transition of care.   Expected Discharge Date:                  Expected Discharge Plan:     In-House Referral:   yes  Discharge planning Services   yes  Post Acute Care Choice:    Choice offered to:     DME Arranged:    DME Agency:     HH Arranged:    HH Agency:     Status of Service:     If discussed at [employer] of Stay Meetings, dates discussed:    Additional Comments:  Shelbie Ammons, RN MSN CCM Care Management [phone] 05/31/2018, 9:03 AM

## 2018-05-31 NOTE — Progress Notes (Signed)
eeg completed ° °

## 2018-05-31 NOTE — Progress Notes (Signed)
*  PRELIMINARY RESULTS* Echocardiogram 2D Echocardiogram has been performed.  Jimmy Hill 05/31/2018, 3:06 PM

## 2018-05-31 NOTE — Consult Note (Signed)
[facility] Nurse wound consult note Reason for Consult: Areas of chronic and acute tissue injury.  S/P fall at home in bathroom. Incontinent, wearing brief Wound type: Acute (trauma) and chronic full thickness wounds. Pressure Injury POA: N/A Measurement: Dry chronic, nonhealing wounds in between toes of both feet consistent with arterial insufficiency, full thickness, clear, dry.  Affected digits include left 2nd toe, right foot: 2-4th does. Largest ulceration measures 2.8cm x 0.5cm x 0.1cm.  Dry red wound beds with dried serum at periphery. Left full thickness tissue loss at elbow: 2cm x 4cm x 0.1cm with moist pink wound bed and surrounding ecchymosis. Small amount of serous to serosanguinous drainage. Full thickness wound at dorsal penis: 0.4cm x 1.6cm x 0.2cm 60% pin, 40% yellow wound bed, no exudate. Mild erythema vs incontinence associated dermatitis (IAD). Wound bed:As described above Drainage (amount, consistency, odor): As described above Periwound: As described above Dressing procedure/placement/frequency: I will recommend conservative care of the bilateral digits as these are maintenance wounds rather than those that would heal without possible revascularization. Orders provided for twice daily cleansing followed by painting the areas with a betadine swabstick and allowing them to air dry. To further protect them from injury, will will provide Prevalon pressure redistribution heel boots for use while in chair or bed.  Patient is not to ambulate with these boots. The left elbow full thickness skin tear received appropriate care upon admission i.e. covering the area with a nonadherent silicone wound contact layer (Mepitel). I have provided Orders for continuing care of that wound using the same nonadherent dressing and topping with a dry gauze and securing with a few turns of Kerlix roll gauze and changing twice weekly until healed.  The POC for the full thickness wound at the dorsal penis will be  complicated by urinary incontinence and the need for routine and episodic incontinence care consisting of cleansing followed by the application of a moisture barrier product. The patient is in a brief today. External catheters are not an option while this wound is wound is present.  [place] nursing team will not follow, but will remain available to this patient, the nursing and medical teams.  Please re-consult if needed. Thanks, Maudie Flakes, MSN, RN, [place], Arther Abbott  Pager# [phone]

## 2018-05-31 NOTE — Care Management Obs Status (Signed)
[place] NOTIFICATION   Patient Details  Name: Jimmy Hill. MRN: [id number] Date of Birth: [birth year]   Medicare Observation Status Notification Given:  Yes    Shelbie Ammons, RN 05/31/2018, 8:48 AM

## 2018-05-31 NOTE — Progress Notes (Addendum)
Pt refused pm blood glucose check.  Refused standing portion of orthostatic vitals.   Port originally accessed in ED.  Catheter not inserted all the way, no biopatch placed, no blood return.  Said nurse advanced catheter appropriately, added biopatch and placed new dated dressing.  Caps changed, line flushed and blood return noted.  Good use for labs.

## 2018-05-31 NOTE — Procedures (Signed)
History: 78 yo M being evaluated for near syncope  Sedation: None  Technique: This is a 21 channel routine scalp EEG performed at the bedside with bipolar and monopolar montages arranged in accordance to the international 10/20 system of electrode placement. One channel was dedicated to EKG recording.    Background: The background consists primarily of a 7 Hz well-organized theta rhythm.  There is also separately rhythmic temporal theta bursts of drowsiness(RTTBD).  In addition, even during maximal wakefulness, there is mild intermixed delta activity.  No epileptiform activity was seen.  With drowsiness there is RTTBD and an increase in delta activity, but no clear sleep was seen.  Photic stimulation: Physiologic driving is not performed  EEG Abnormalities: 1) Mild generalized irregular slow activity 2) Slow PDR  Clinical Interpretation: This EEG is consistent with a mild generalized non-specific cerebral dysfunction(encephalopathy).  There was no seizure or seizure predisposition recorded on this study. Please note that lack of epileptiform activity on EEG does not preclude the possibility of epilepsy.   Roland Rack, MD [facility] [phone]  If 7pm- 7am, please page neurology on call as listed in [place].

## 2018-05-31 NOTE — NC FL2 (Signed)
[place] LEVEL OF CARE SCREENING TOOL     IDENTIFICATION  Patient Name: Jimmy Hill. Birthdate: Feb 10, 1941 Sex: male Admission Date (Current Location): 05/30/2018  [facility] and [place] Number:  Engineering geologist and Address:  [facility], [address], [place], [place] [address]      Provider Number: [id number]  Attending Physician Name and Address:  Epifanio Lesches, MD  Relative Name and Phone Number:       Current Level of Care: Hospital Recommended Level of Care: [place] Prior Approval Number:    Date Approved/Denied:   PASRR Number: [id number]  Discharge Plan: SNF    Current Diagnoses: Patient Active Problem List   Diagnosis Date Noted  . Encounter for antineoplastic chemotherapy 04/30/2018  . Dehydration 04/30/2018  . 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 Cancer-related pain 03/18/2018  . Goals of care, counseling/discussion 03/18/2018  . Iron deficiency 03/02/2018  . Liver lesion 01/22/2018  . Acute exacerbation of chronic obstructive pulmonary disease (COPD) ([place]) 09/23/2017  . Sepsis ([place]) 05/01/2017  . Syncope 04/30/2017  . New onset atrial fibrillation ([place]) 07/03/2016  . Loss of weight   . Gastritis without bleeding   . Occult GI bleeding   . GI bleed 03/02/2016  . Left-sided weakness 03/02/2016  . Rectal bleeding 01/16/2016  . Colitis presumed infectious 01/10/2016  . Pressure ulcer 12/29/2015  . Altered mental status 12/28/2015  . Fracture of femoral neck, left ([place]) 12/03/2015  . CAD (coronary artery disease) 12/03/2015  . Sleep apnea 12/03/2015  . Femoral neck fracture, left, closed, initial encounter 12/03/2015  . Unstable angina ([place]) 05/14/2015  . COPD (chronic obstructive pulmonary disease) ([place]) 02/05/2015  . Acute bronchitis 02/05/2015  . Type 2 diabetes mellitus ([place]) 02/05/2015  . Chest pain 12/09/2014  . Presence of drug coated stent in LAD coronary artery  07/11/2013  . Disease characterized by destruction of skeletal muscle 09/12/2009  . Encephalopathy 09/12/2009  . Vitamin D deficiency 05/05/2008  . Polyneuropathy in diabetes ([place]) 08/17/2005  . Spinal stenosis in cervical region 03/20/2005  . Essential tremor 01/17/2005  . Hypercholesterolemia 03/21/2004  . Hypertension, benign 01/08/2004  . 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 Localized osteoarthrosis, lower leg 04/07/2002  . Parkinson's disease ([place]) 09/21/2000  . Peptic ulcer with hemorrhage and perforation ([place]) 04/19/1996  . 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    Orientation RESPIRATION BLADDER Height & Weight     Self, Time, Place  Normal Continent Weight: 180 lb (81.6 kg) Height:  5\' 8"  (172.7 cm)  BEHAVIORAL SYMPTOMS/MOOD NEUROLOGICAL BOWEL NUTRITION STATUS  (none) (none) Incontinent Diet(Heart Healthy/Carb Modified)  AMBULATORY STATUS COMMUNICATION OF NEEDS Skin   Extensive Assist Verbally Normal                       Personal Care Assistance Level of Assistance  Bathing, Feeding, Dressing Bathing Assistance: Limited assistance Feeding assistance: Independent Dressing Assistance: Limited assistance     Functional Limitations Info  Sight, Hearing, Speech Sight Info: Adequate Hearing Info: Adequate Speech Info: Adequate    SPECIAL CARE FACTORS FREQUENCY  PT (By licensed PT), OT (By licensed OT)     PT Frequency: 5 OT Frequency: 5            Contractures Contractures Info: Not present    Additional Factors Info  Code Status, Allergies Code Status Info: Full Code  Allergies Info:  Bupropion, Bactrim, Cheese  Current Medications (05/31/2018):  This is the current hospital active medication list Current Facility-Administered Medications  Medication Dose Route Frequency Provider Last Rate Last Dose  . 0.9 %  sodium chloride infusion   Intravenous Continuous Gladstone Lighter, MD 75 mL/hr at 05/31/18 0259    .  acetaminophen (TYLENOL) tablet 650 mg  650 mg Oral Q6H PRN Gladstone Lighter, MD       Or  . acetaminophen (TYLENOL) suppository 650 mg  650 mg Rectal Q6H PRN Gladstone Lighter, MD      . albuterol (PROVENTIL) (2.5 MG/3ML) 0.083% nebulizer solution 2.5 mg  2.5 mg Nebulization QID PRN Gladstone Lighter, MD      . amiodarone (PACERONE) tablet 100 mg  100 mg Oral Daily Gladstone Lighter, MD   100 mg at 05/31/18 0859  . atorvastatin (LIPITOR) tablet 80 mg  80 mg Oral QHS Gladstone Lighter, MD   80 mg at 05/30/18 2209  . brimonidine (ALPHAGAN) 0.2 % ophthalmic solution 1 drop  1 drop Both Eyes BID Gladstone Lighter, MD   1 drop at 05/31/18 0847  . calcium-vitamin D (OSCAL WITH D) 500-200 MG-UNIT per tablet 1 tablet  1 tablet Oral Daily Gladstone Lighter, MD   1 tablet at 05/31/18 0858  . carbidopa-levodopa (SINEMET IR) 25-250 MG per tablet immediate release 1 tablet  1 tablet Oral TID Gladstone Lighter, MD   1 tablet at 05/31/18 1408  . carbidopa-levodopa (SINEMET IR) 25-250 MG per tablet immediate release 2 tablet  2 tablet Oral Daily Gladstone Lighter, MD   2 tablet at 05/31/18 0855  . enoxaparin (LOVENOX) injection 40 mg  40 mg Subcutaneous Q24H Gladstone Lighter, MD   40 mg at 05/30/18 2212  . ferrous sulfate tablet 325 mg  325 mg Oral Q breakfast Gladstone Lighter, MD   325 mg at 05/31/18 0900  . fludrocortisone (FLORINEF) tablet 0.3 mg  0.3 mg Oral Daily Gladstone Lighter, MD   0.3 mg at 05/31/18 0857  . insulin aspart (novoLOG) injection 0-5 Units  0-5 Units Subcutaneous QHS Gladstone Lighter, MD      . insulin aspart (novoLOG) injection 0-9 Units  0-9 Units Subcutaneous TID WC Gladstone Lighter, MD   2 Units at 05/31/18 1408  . insulin glargine (LANTUS) injection 20 Units  20 Units Subcutaneous q morning - 10a Gladstone Lighter, MD   20 Units at 05/31/18 0851  . isosorbide mononitrate (ISMO,MONOKET) tablet 5 mg  5 mg Oral QHS Gladstone Lighter, MD   5 mg at 05/30/18 2210  .  levETIRAcetam (KEPPRA) tablet 500 mg  500 mg Oral BID Gladstone Lighter, MD   500 mg at 05/31/18 0858  . lidocaine (LIDODERM) 5 % 1 patch  1 patch Transdermal Q24H Gladstone Lighter, MD   1 patch at 05/30/18 2216  . lidocaine-prilocaine (EMLA) cream   Topical Daily PRN Gladstone Lighter, MD      . loperamide (IMODIUM) capsule 2 mg  2 mg Oral QID PRN Gladstone Lighter, MD      . magnesium oxide (MAG-OX) tablet 400 mg  400 mg Oral Daily Gladstone Lighter, MD   400 mg at 05/31/18 0901  . metoprolol succinate (TOPROL-XL) 24 hr tablet 25 mg  25 mg Oral Daily Gladstone Lighter, MD      . mirabegron ER (MYRBETRIQ) tablet 50 mg  50 mg Oral Daily Gladstone Lighter, MD   50 mg at 05/31/18 0855  . mometasone-formoterol (DULERA) 100-5 MCG/ACT inhaler 2 puff  2 puff Inhalation BID Gladstone Lighter, MD   2  puff at 05/31/18 0850  . ondansetron (ZOFRAN) tablet 4 mg  4 mg Oral Q6H PRN Gladstone Lighter, MD       Or  . ondansetron (ZOFRAN) injection 4 mg  4 mg Intravenous Q6H PRN Gladstone Lighter, MD      . pantoprazole (PROTONIX) EC tablet 40 mg  40 mg Oral Daily Gladstone Lighter, MD   40 mg at 05/31/18 0858  . polyvinyl alcohol (LIQUIFILM TEARS) 1.4 % ophthalmic solution 1 drop  1 drop Both Eyes TID Gladstone Lighter, MD   1 drop at 05/31/18 0846  . timolol (TIMOPTIC) 0.5 % ophthalmic solution 1 drop  1 drop Both Eyes BID Gladstone Lighter, MD   1 drop at 05/31/18 0847  . traMADol (ULTRAM) tablet 50 mg  50 mg Oral Q6H PRN Gladstone Lighter, MD   50 mg at 05/31/18 0254  . vitamin B-12 (CYANOCOBALAMIN) tablet 1,000 mcg  1,000 mcg Oral Daily Gladstone Lighter, MD   1,000 mcg at 05/31/18 0901   Facility-Administered Medications Ordered in Other Encounters  Medication Dose Route Frequency Provider Last Rate Last Dose  . heparin lock flush 100 unit/mL  500 Units Intravenous Once Corcoran, Melissa C, MD      . sodium chloride flush (NS) 0.9 % injection 10 mL  10 mL Intravenous PRN Lequita Asal,  MD   10 mL at 05/28/18 0830     Discharge Medications: Please see discharge summary for a list of discharge medications.  Relevant Imaging Results:  Relevant Lab Results:   Additional Information SSN: [id number]  Annamaria Boots, [place]

## 2018-05-31 NOTE — Progress Notes (Addendum)
[place] [facility] [place] NAME: Jimmy Hill    MR#:  [id number]  DATE OF BIRTH:  1941/05/13  SUBJECTIVE: Patient is seen at bedside, very weak, admitted for syncope, found to have orthostatic hypotension.  Patient blood pressure dropped more than 20 points when he was working with physical therapy.  Physical therapy recommended SNF placement.  CHIEF COMPLAINT:   Chief Complaint  Patient presents with  . Near Syncope    REVIEW OF SYSTEMS:   ROS CONSTITUTIONAL:  leg weakness, patient mentioned that he is getting recurrent syncopes recently.  EYES: No blurred or double vision.  EARS, NOSE, AND THROAT: No tinnitus or ear pain.  RESPIRATORY: No cough, shortness of breath, wheezing or hemoptysis.  CARDIOVASCULAR: No chest pain, orthopnea, edema.  GASTROINTESTINAL: No nausea, vomiting, diarrhea or abdominal pain.  GENITOURINARY: No dysuria, hematuria.  ENDOCRINE: No polyuria, nocturia,  HEMATOLOGY: No anemia, easy bruising or bleeding SKIN: No rash or lesion. MUSCULOSKELETAL: No joint pain or arthritis.   NEUROLOGIC: No tingling, numbness, weakness.  Patient has dizziness. PSYCHIATRY: No anxiety or depression.   DRUG ALLERGIES:   Allergies  Allergen Reactions  . Bupropion Other (See Comments)    Seizure  . Bactrim [Sulfamethoxazole-Trimethoprim] Other (See Comments)  . Cheese     Patient doesn't want any cheese    VITALS:  Blood pressure 98/70, pulse 96, temperature 98.9 F (37.2 C), temperature source Oral, resp. rate 18, height 5\' 8"  (1.727 m), weight 81.6 kg, SpO2 96 %.  PHYSICAL EXAMINATION:  GENERAL:  78 y.o.-year-old patient lying in the bed with no acute distress.  EYES: Pupils equal, round, reactive to light and accommodation. No scleral icterus. Extraocular muscles intact.  HEENT: Head atraumatic, normocephalic. Oropharynx and nasopharynx clear.  NECK:  Supple, no jugular venous distention. No thyroid enlargement, no  tenderness.  LUNGS: Normal breath sounds bilaterally, no wheezing, rales,rhonchi or crepitation. No use of accessory muscles of respiration.  CARDIOVASCULAR: S1, S2 normal. No murmurs, rubs, or gallops.  ABDOMEN: Soft, nontender, nondistended. Bowel sounds present. No organomegaly or mass.  EXTREMITIES: No pedal edema, cyanosis, or clubbing.  NEUROLOGIC: Cranial nerves II through XII are intact. Muscle strength 5/5 in all extremities. Sensation intact. Gait not checked.  PSYCHIATRIC: The patient is alert and oriented x 3.  SKIN: No obvious rash, lesion, or ulcer.    LABORATORY PANEL:   CBC Recent Labs  Lab 05/31/18 0244  WBC 7.4  HGB 12.6*  HCT 38.4*  PLT 184   ------------------------------------------------------------------------------------------------------------------  Chemistries  Recent Labs  Lab 05/28/18 0822 05/30/18 1353 05/31/18 0244  NA 135 138 138  K 5.3* 4.2 3.6  CL 96* 96* 101  CO2 22 30 29   GLUCOSE 319* 121* 140*  BUN 34* 25* 20  CREATININE 1.39* 0.93 0.89  CALCIUM 8.3* 8.7* 7.7*  MG 1.3*  --   --   AST 30 31  --   ALT 21 24  --   ALKPHOS 104 128*  --   BILITOT 0.6 0.7  --    ------------------------------------------------------------------------------------------------------------------  Cardiac Enzymes Recent Labs  Lab 05/30/18 1353  TROPONINI 0.03*   ------------------------------------------------------------------------------------------------------------------  RADIOLOGY:  Dg Chest 2 View  Result Date: 05/30/2018 CLINICAL DATA:  Syncope. EXAM: CHEST - 2 VIEW COMPARISON:  Chest x-ray dated May 21, 2018. FINDINGS: Unchanged right chest wall port catheter. The heart size and mediastinal contours are within normal limits. Normal pulmonary vascularity. No focal consolidation, pleural effusion, or pneumothorax. IMPRESSION: No active cardiopulmonary  disease. Electronically Signed   By: Titus Dubin M.D.   On: 05/30/2018 15:45   Ct Head  Wo Contrast  Result Date: 05/30/2018 CLINICAL DATA:  Found down. Abdominal pain. History of liver cancer and thrombocytopenia. EXAM: CT HEAD WITHOUT CONTRAST TECHNIQUE: Contiguous axial images were obtained from the base of the skull through the vertex without intravenous contrast. COMPARISON:  CT head 05/21/2018 and 12/16/2017. FINDINGS: Brain: There is no evidence of acute intracranial hemorrhage, mass lesion, brain edema or extra-axial fluid collection. There is stable atrophy with diffuse prominence of the ventricles and subarachnoid spaces. There are stable chronic small vessel ischemic changes in the periventricular and subcortical white matter. There is no CT evidence of acute cortical infarction. Vascular: Prominent intracranial vascular calcifications. No hyperdense vessel identified. Skull: Negative for fracture or focal lesion. Sinuses/Orbits: The visualized paranasal sinuses and mastoid air cells are clear. No orbital abnormalities are seen. Other: None. IMPRESSION: Stable examination without acute findings. Chronic atrophy and small vessel ischemic changes. Electronically Signed   By: Richardean Sale M.D.   On: 05/30/2018 14:30   Ct Abdomen Pelvis W Contrast  Result Date: 05/30/2018 CLINICAL DATA:  Initial evaluation for acute generalized abdominal pain. History of hepatocellular carcinoma. EXAM: CT ABDOMEN AND PELVIS WITH CONTRAST TECHNIQUE: Multidetector CT imaging of the abdomen and pelvis was performed using the standard protocol following bolus administration of intravenous contrast. CONTRAST:  174mL OMNIPAQUE IOHEXOL 300 MG/ML  SOLN COMPARISON:  Recent MRI from 04/23/2018. FINDINGS: Lower chest: Emphysematous changes noted. Superimposed mild scattered bibasilar atelectasis. Hepatobiliary: Large infiltrative mass lesion involving the lateral segment of the left hepatic lobe again seen, relatively similar measuring up to approximately 10 cm in size. Multiple additional hypodensities within the  liver, consistent with metastatic disease. Findings relatively similar to prior MRI. Underlying hepatic cirrhosis noted. Gallbladder within normal limits. No biliary dilatation. Pancreas: Pancreas within normal limits. Spleen: Unremarkable. Adrenals/Urinary Tract: Adrenal glands within normal limits. Kidneys equal in size with symmetric enhancement. Scattered bilateral renal cysts noted, stable. Nonobstructive calculi present within the left kidney, largest of which present at the lower pole and measures 6 mm. On the right, there is a 5 mm stone positioned at the right UPJ without significant hydronephrosis (series 5, image 56). No focal enhancing renal mass. No hydroureter. Partially distended bladder within normal limits. Stomach/Bowel: Stomach decompressed without acute abnormality. No evidence for bowel obstruction. Appendix not discretely visualized. No acute inflammatory changes seen about the bowels. Vascular/Lymphatic: Advanced aorto bi-iliac atherosclerotic disease. Intra-abdominal aorta mildly ectatic measuring up to 2.5 cm in diameter. Mesenteric vessels patent proximally. 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 New carotic gastrohepatic adenopathy, largest of which measures 1.8 cm in size, compatible with nodal metastases. Additional scattered paraesophageal nodes measure up to 12 mm. Necrotic periaortic adenopathy noted, largest of which at the celiac nodal station measuring 16 mm in size. Reproductive: Prostate mildly enlarged measuring 5.6 cm in diameter. Other: Trace free fluid seen adjacent to the liver and spleen. No free intraperitoneal air. Musculoskeletal: No acute osseous abnormality. No worrisome lytic or blastic osseous lesions. Prior posterior fusion at L2 through L5. Prior ORIF at the left hip. IMPRESSION: 1. 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 Multiple additional hypodense lesions scattered within both hepatic lobes compatible with metastatic disease. Changes grossly similar relative to recent MRI. 2. Necrotic lower paraesophageal, gastrohepatic,  hepatoduodenal, and para-aortic nodal metastases, also similar. 3. 5 mm stone positioned at the right UPJ without significant hydronephrosis. Additional nonobstructive left renal nephrolithiasis as above. 4. Trace ascites within the upper abdomen. 5. Advanced aorto bi-iliac atherosclerotic disease Electronically Signed   By: Jeannine Boga M.D.   On: 05/30/2018 16:11   US Carotid Bilateral  Result Date: 05/30/2018 CLINICAL DATA:  78 year old who had a syncopal episode earlier today. Current history of hepatocellular carcinoma. EXAM: BILATERAL CAROTID DUPLEX ULTRASOUND TECHNIQUE: Pearline Cables scale imaging, color Doppler and duplex ultrasound were performed of bilateral carotid and vertebral arteries in the neck. COMPARISON:  None. FINDINGS: Criteria: Quantification of carotid stenosis is based on velocity parameters that correlate the residual internal carotid diameter with NASCET-based stenosis levels, using the diameter of the distal internal carotid lumen as the denominator for stenosis measurement. The following velocity measurements were obtained: RIGHT ICA: 95/15 cm/sec CCA: 53/6 cm/sec SYSTOLIC ICA/CCA RATIO:  1.5 ECA: 153/3 cm/sec LEFT ICA: 64/14 cm/sec CCA: 14/43 cm/sec SYSTOLIC ICA/CCA RATIO:  1.2 ECA: 110/10 cm/sec RIGHT CAROTID ARTERY: Calcified and noncalcified plaque involving the distal CCA extending into the carotid bulb and proximal ICA and ECA. Stenosis involving the origin of the ECA. RIGHT VERTEBRAL ARTERY:  Normal antegrade flow. LEFT CAROTID ARTERY: Calcified and noncalcified plaque involving the distal CCA extending into the carotid bulb and proximal ICA and ECA. No visible stenoses. LEFT VERTEBRAL ARTERY:  Normal antegrade flow. IMPRESSION: 1. No evidence of  hemodynamically significant stenosis involving either the RIGHT or LEFT CCA or ICA by Doppler criteria. 2. Stenosis involving the origin of the RIGHT ECA which is of doubtful clinical significance. 3. Antegrade flow in both vertebral arteries. Electronically Signed   By: Evangeline Dakin M.D.   On: 05/30/2018 17:24   Dg Knee Left Port  Result Date: 05/31/2018 CLINICAL DATA:  Fall with left knee pain and abrasion. EXAM: PORTABLE LEFT KNEE - 1-2 VIEW COMPARISON:  None. FINDINGS: Crescentic bony structures lateral to the lateral femoral condyle on the frontal view, likely heterotopic ossification from old injury. No suspected acute fracture. No joint effusion or joint narrowing. Osteopenia and atherosclerosis. IMPRESSION: Crescentic bony structures about the lateral femoral condyle, favor heterotopic ossification from old injury over an acute avulsion fracture. Recommend follow-up if there is focal tenderness in this area. Otherwise negative. Electronically Signed   By: Monte Fantasia M.D.   On: 05/31/2018 04:17    EKG:   Orders placed or performed in visit on 05/30/18  . EKG 12-Lead  . EKG 12-Lead  . 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   #1. recurrent syncope, patient has been having syncopal episodes at home recently once a week at least.  Likely secondary to orthostatic hypotension, physical therapy recommends SNF placement.  No arrhythmias on telemetry.  Echocardiogram pending, carotid ultrasound did not show any hemodynamically significant stenosis.  Increase her the dose of Florinef to 0.3 mg daily, received gentle hydration.  Patient syncope likely due to hypotension, due to underlying cancer and poor p.o. intake.  2.  Cholangiocarcinoma, follows with Dr. Mike Gip, patient also has liver mets.  3 .diabetes mellitus type 2 without complications: Continue home  medicines Lactic acidosis, improving lactic acid, source of sepsis unknown at this time.  Hold metformin because of lactic acidosis.   3.  History of Parkinson disease: Continue Sinemet.  4.  History of Parkinson disease, patient already on Keppra.   #  5 history of COPD: With no wheezing.  Prognosis poor, seen by palliative care.  Patient high risk for recurrent admission.  Followed by pace program requiring SNF.  All the records are reviewed and case discussed with Care   Management/Social Workerr. Management plans discussed with the patient, family and they are in agreement.  CODE STATUS: Full code  TOTAL TIME TAKING CARE OF THIS PATIENT: 38 minutes.   POSSIBLE D/C IN 1-2 DAYS, DEPENDING ON CLINICAL CONDITION.   Epifanio Lesches M.D on 05/31/2018 at 12:42 PM  Between 7am to 6pm - Pager - [phone]  After 6pm go to [email or web address] - password [email or web address] [facility]  [facility]  Office  [phone]  CC: Primary care physician; [employer], [place]   Note: This dictation was prepared with Sales executive along with smaller Company secretary. Any transcriptional errors that result from this process are unintentional.

## 2018-05-31 NOTE — Evaluation (Signed)
Physical Therapy Evaluation Patient Details Name: Jimmy Hill. MRN: [id number] DOB: 03-29-1941 Today's Date: 05/31/2018   History of Present Illness  Pt is a 78 y/o M who presented s/p syncopal episode.  CT head negative.  Pt's PMH includes intrahepatic cholangiocarcinoma with chemotherapy, PDs, autonomic dysreflexia, CKD, COPD, back pain, MI, seizures.     Clinical Impression  Pt admitted with above diagnosis. Pt currently with functional limitations due to the deficits listed below (see PT Problem List). Jimmy Hill was pleasant and agreeable to therapy.  He was +orthostatic but asymptomatic.  BP stabilized after sitting and was able to ambulate a total of 50 ft (25 ft, 47ft), limited by fatigue and chronic LBP.  HR and SpO2 stable throughout session.  Recommending OT eval and treat for safety with ADLs given pt's +orthostasis and instability in standing. Pt will benefit from skilled PT to increase their independence and safety with mobility to allow discharge to the venue listed below.      Follow Up Recommendations Home health PT;Supervision/Assistance - 24 hour    Equipment Recommendations  None recommended by PT    Recommendations for Other Services OT consult(for safety with ADLs)     Precautions / Restrictions Precautions Precautions: Fall;Other (comment) Precaution Comments: +orthostatic Restrictions Weight Bearing Restrictions: No      Mobility  Bed Mobility Overal bed mobility: Needs Assistance Bed Mobility: Supine to Sit     Supine to sit: Min guard;HOB elevated     General bed mobility comments: Pt with heavy use of bed rail.  HOB elevated. Increased effort and time.   Transfers Overall transfer level: Needs assistance Equipment used: Rolling walker (2 wheeled) Transfers: Sit to/from [facility] Sit to Stand: Min guard Stand pivot transfers: Min guard       General transfer comment: Cues for proper hand placement (pt used to using  rollator at baseline).  Flexed posture throughout with little improvement with cues.  Pt remains steady with RW.  +orthostatic  Ambulation/Gait Ambulation/Gait assistance: Min guard;+2 safety/equipment Gait Distance (Feet): 50 Feet(25, 25) Assistive device: Rolling walker (2 wheeled) Gait Pattern/deviations: Decreased step length - right;Decreased step length - left;Trunk flexed Gait velocity: decreased   General Gait Details: Flexed posture throughout, exaggerated as distance increases, ultimately limiting distance ambulated.  Pt remains steady using RW.    Stairs            Wheelchair Mobility    Modified Rankin (Stroke Patients Only)       Balance Overall balance assessment: Needs assistance;History of Falls Sitting-balance support: No upper extremity supported;Feet supported Sitting balance-Leahy Scale: Fair     Standing balance support: Single extremity supported;During functional activity Standing balance-Leahy Scale: Poor Standing balance comment: Pt relies on at least 1UE support for static and dynamic activities.  Pt is able to stand at bedside to use urinal without UE support; however, has Bil LEs braced against side of bed for support.                              Pertinent Vitals/Pain Pain Assessment: Faces Faces Pain Scale: Hurts little more Pain Location: R port site Pain Descriptors / Indicators: Discomfort Pain Intervention(s): Limited activity within patient's tolerance;Monitored during session    Home Living Family/patient expects to be discharged to:: Private residence Living Arrangements: Alone Available Help at Discharge: Personal care attendant;Available PRN/intermittently Type of Home: Apartment Home Access: Ramped entrance     Home  Layout: One level Home Equipment: Walker - 4 wheels;Bedside commode;Shower seat;Electric scooter;Wheelchair - power;Wheelchair - manual;Grab bars - toilet;Other (comment)(lift chair) Additional  Comments: Has aides 5 hrs/day, 7 days/wk.  Also has RN and MD who check on pt daily.    Prior Function Level of Independence: Needs assistance   Gait / Transfers Assistance Needed: Pt reports ambulating short household distances with RW.  Otherwise using mainly manual WC which he can push on his own short distances.  Pt reports syncopal episode ~1x/month which is asymptomatic.    ADL's / Homemaking Assistance Needed: Pt reports he is independent with a sponge bath and dressing.  RN from [facility] reports pt requires assist for bathing.  Aide does the cooking and running errands.  Pt no longer driving.  Goes to [facility] 3x/wk.  Aide does some light cleaning.         Hand Dominance   Dominant Hand: Right    Extremity/Trunk Assessment   Upper Extremity Assessment Upper Extremity Assessment: Generalized weakness    Lower Extremity Assessment Lower Extremity Assessment: Generalized weakness    Cervical / Trunk Assessment Cervical / Trunk Assessment: Other exceptions Cervical / Trunk Exceptions: h/o chronic LBP  Communication   Communication: No difficulties  Cognition Arousal/Alertness: Awake/alert Behavior During Therapy: WFL for tasks assessed/performed Overall Cognitive Status: Within Functional Limits for tasks assessed                                        General Comments General comments (skin integrity, edema, etc.): +orthostatic.  Systolic in 295J in supine, systolic 884 sitting EOB, systolic 83 standing.  Systolic remained in 16S after several minutes standing.  After pt sat for several minutes systolic back up to low 063K.  After ambulating 25 ft systolic remains in low 160F.  Pt asymptomatic throughout.  HR 80s at rest, up to 107 with activity.  SpO289-92% on RA at rest in supine, remains at or above 91% with activity.     Exercises     Assessment/Plan    PT Assessment Patient needs continued PT services  PT Problem List Decreased strength;Decreased  activity tolerance;Decreased balance;Pain;Decreased mobility;Decreased knowledge of use of DME;Decreased safety awareness       PT Treatment Interventions DME instruction;Gait training;Functional mobility training;Therapeutic activities;Therapeutic exercise;Balance training;Neuromuscular re-education;Patient/family education    PT Goals (Current goals can be found in the Care Plan section)  Acute Rehab PT Goals Patient Stated Goal: to return home at d/c PT Goal Formulation: With patient Time For Goal Achievement: 06/14/18 Potential to Achieve Goals: Good    Frequency Min 2X/week   Barriers to discharge Decreased caregiver support No assist/supervision at night    Co-evaluation               AM-PAC PT "6 Clicks" Mobility  Outcome Measure Help needed turning from your back to your side while in a flat bed without using bedrails?: A Little Help needed moving from lying on your back to sitting on the side of a flat bed without using bedrails?: A Little Help needed moving to and from a bed to a chair (including a wheelchair)?: A Little Help needed standing up from a chair using your arms (e.g., wheelchair or bedside chair)?: A Little Help needed to walk in hospital room?: A Little Help needed climbing 3-5 steps with a railing? : A Lot 6 Click Score: 17    End of  Session Equipment Utilized During Treatment: Gait belt Activity Tolerance: Patient tolerated treatment well;Patient limited by fatigue;Patient limited by pain Patient left: in chair;with call bell/phone within reach;with chair alarm set;Other (comment)(MD from [facility] in room) Nurse Communication: Mobility status;Other (comment)(BP) PT Visit Diagnosis: Unsteadiness on feet (R26.81);Other abnormalities of gait and mobility (R26.89);Muscle weakness (generalized) (M62.81);Pain;History of falling (Z91.81) Pain - Right/Left: (lower central) Pain - part of body: (back)    Time: 6002-9847 PT Time Calculation (min) (ACUTE  ONLY): 56 min   Charges:   PT Evaluation $PT Eval Low Complexity: 1 Low PT Treatments $Gait Training: 8-22 mins $Therapeutic Activity: 23-37 mins       Collie Siad PT, DPT 05/31/2018, 1:14 PM

## 2018-06-01 LAB — GLUCOSE, CAPILLARY
Glucose-Capillary: 154 mg/dL — ABNORMAL HIGH (ref 70–99)
Glucose-Capillary: 150 mg/dL — ABNORMAL HIGH (ref 70–99)

## 2018-06-01 MED ORDER — GUAIFENESIN-DM 100-10 MG/5ML PO SYRP
5.0000 mL | ORAL_SOLUTION | ORAL | Status: DC | PRN
  Filled 2018-06-01: qty 5

## 2018-06-01 NOTE — Discharge Summary (Signed)
Jimmy Hill., is a 78 y.o. male  DOB 1940/12/23  MRN [id number].  Admission date:  05/30/2018  Admitting Physician  Gladstone Lighter, MD  Discharge Date:  06/01/2018   Primary MD  [facility], [place]  Recommendations for primary care physician for things to follow:   Followed by [facility] program.   Admission Diagnosis  Syncope [R55] Syncope, unspecified syncope type [R55] Diarrhea, unspecified type [R19.7]   Discharge Diagnosis  Syncope [R55] Syncope, unspecified syncope type [R55] Diarrhea, unspecified type [R19.7]    Active Problems:   Syncope      Past Medical History:  Diagnosis Date  . Allergic rhinitis   . Anginal pain ([place])   . Arthritis   . Autonomic dysreflexia   . Back pain    CHRONIC LBP,STENOSIS  . CHF (congestive heart failure) ([place])   . Chronic kidney disease    STAGE 3  . COPD (chronic obstructive pulmonary disease) ([place])   . Coronary artery disease   . Depression   . Diabetes ([place])   . Diabetes mellitus without complication ([place])   . DJD (degenerative joint disease)   . Dysphagia   . Dysrhythmia    A-fib, now converted  . Edema    FEET/ANKLES  . GERD (gastroesophageal reflux disease)   . Glaucoma   . H/O orthostatic hypotension   . Hypercholesterolemia   . Myocardial infarction ([place]) 2000  . Parkinson's disease ([place])   . Peripheral vascular disease ([place])   . Pneumonia    history of   . Polyneuropathy   . RLS (restless legs syndrome)   . Seizures ([place])    when in rehab for his hip  . Sleep apnea   . Smoker   . Spinal stenosis   . Strabismic amblyopia   . Tinnitus     Past Surgical History:  Procedure Laterality Date  . ANKLE SURGERY    . APPENDECTOMY    . BACK SURGERY    . CATARACT EXTRACTION W/PHACO Left 08/26/2016   Procedure: CATARACT  EXTRACTION PHACO AND INTRAOCULAR LENS PLACEMENT (IOC);  Surgeon: Birder Robson, MD;  Location: [facility] ORS;  Service: Ophthalmology;  Laterality: Left;  [place] 01:37 AP% 19.4 CDE 18.97 fluid pack lot # [id number]  . CORONARY ANGIOPLASTY     STENT  . CTR    . ESOPHAGOGASTRODUODENOSCOPY (EGD) WITH PROPOFOL N/A 03/04/2016   Procedure: ESOPHAGOGASTRODUODENOSCOPY (EGD) WITH PROPOFOL;  Surgeon: [name], MD;  Location: [facility] ENDOSCOPY;  Service: Endoscopy;  Laterality: N/A;  . EYE SURGERY     cataract  . FRACTURE SURGERY    . HIP PINNING,CANNULATED Left 12/04/2015   Procedure: CANNULATED HIP PINNING;  Surgeon: Thornton Park, MD;  Location: [facility] ORS;  Service: Orthopedics;  Laterality: Left;  . 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   Hospital Course  #1. recurrent syncope, patient has been having syncopal  episodes at home, the latest being where he was found covered in feces.  So he is admitted for syncope evaluation, patient syncope work-up has been largely within normal limits except orthostatic hypotension, patient was taking Florinef for autonomic dysreflexia so we increase the  Florinef to 0.3 mg daily.  Patient CT of the head unremarkable, seen by neurology, EEG also did not show any seizure activity.  Patient seen by pace program physicians, spoke with Dr. Demetrio Lapping, recommended that he  finally agreed to go home pace program is trying to arrange hospice through pace.  So patient is eager to go home today.  Also will get improved home health through pace program.  Physical therapy recommended SNF placement but patient refused to go and he wants to go home, pace program is trying to arrange hospice at home. 2.  Lactic acidosis likely due to syncope.  Patient received prophylactic Vanco and Zosyn in the ED.  UA is normal, WBC is within normal range.  No evidence of infection. 3.  Chronic tremors of hands, gait impairment.  Patient does have Parkinson disease, he is on levodopa, on Keppra.  EEG showed generalized slowing. 4.  Intrahepatic cholangiocarcinoma, patient is looking for hospice at home.  P[facility] program to arrange for further therapy with chemo if appropriate.  Patient is on oxycodone,:, Tramadol at home for his chronic pain due to cancer increase risk for falls and also syncope. 5.  Diabetes mellitus type 2: Patient is on metformin, Lantus, at home and held on admission because of lactic acidosis.  Patient should be off metformin for at least 2 more days before restarting.  6 .history of atrial fibrillation, patient is on amiodarone, metoprolol.  Also on Eliquis but stopped due to history of recurrent falls, syncope, high risk for bleeds. History of COPD, no wheezing, continue home meds. Discharge Condition:stable  Follow UP  [place], [place]. Schedule an appointment as soon as possible for a visit in 1 week(s).   Contact information: [place] [address] [phone]             Discharge Instructions  and  Discharge Medications   Stable   Allergies as of 06/01/2018      Reactions   Bupropion Other (See Comments)   Seizure   Bactrim [sulfamethoxazole-trimethoprim] Other (See Comments)   Cheese    Patient doesn't want any cheese      Medication List    STOP taking these medications    metFORMIN 1000 MG tablet Commonly known as:  GLUCOPHAGE     TAKE these medications   acetaminophen 325 MG tablet Commonly known as:  TYLENOL Take 650 mg by mouth every 6 (six) hours as needed for mild pain or fever.   albuterol (2.5 MG/3ML) 0.083% nebulizer solution Commonly known as:  PROVENTIL Take 2.5 mg by nebulization 4 (four) times daily as needed for wheezing or shortness of breath.   albuterol 108 (90 Base) MCG/ACT inhaler Commonly known as:  PROVENTIL HFA;VENTOLIN HFA Inhale into the lungs every 6 (six) hours as needed for wheezing or shortness of breath.   amiodarone 100 MG tablet Commonly known as:  PACERONE Take 100 mg by mouth daily.   atorvastatin 80 MG tablet Commonly known as:  LIPITOR Take 80 mg by mouth at bedtime.   CALCIUM 600+D PO Take 1 tablet by mouth daily.   carbidopa-levodopa 25-250 MG tablet Commonly known as:  SINEMET IR Take  2 tablets by mouth every morning (8AM), 1 tablet at noon, 1 tablet every afternoon (430PM) and 1 tablet at bedtime (10PM)   COMBIGAN 0.2-0.5 % ophthalmic solution Generic drug:  brimonidine-timolol Place 1 drop into both eyes 2 (two) times daily.   dexamethasone 4 MG tablet Commonly known as:  DECADRON Take 2 tablets by mouth once a day on the day after chemotherapy and then take 1 tablet two times a day for 2 days. Take with food.   ferrous sulfate 325 (65 FE) MG tablet Take 325 mg by mouth daily with breakfast.   fludrocortisone 0.1 MG tablet Commonly known as:  FLORINEF Take 3 tablets (0.3 mg total) by mouth daily. What changed:    medication strength  how much to take   GLUTOSE 15 40 % Gel Generic drug:  dextrose Take 1 Tube by mouth. For blood sugar less than 70, repeat in 5 minutes up to 2 doses   ibuprofen 200 MG tablet Commonly known as:  ADVIL,MOTRIN Take 400 mg by mouth every 6 (six) hours as needed for fever or mild pain.   insulin glargine 100 UNIT/ML injection Commonly known as:   LANTUS Inject 20 Units into the skin every morning.   isosorbide mononitrate 10 MG tablet Commonly known as:  ISMO,MONOKET Take 5 mg by mouth at bedtime.   Lenvatinib 12 mg daily dose 3 x 4 MG capsule Commonly known as:  LENVIMA Take 12 mg by mouth daily.   levETIRAcetam 500 MG tablet Commonly known as:  KEPPRA Take 500 mg by mouth 2 (two) times daily.   lidocaine 5 % Commonly known as:  LIDODERM Place 1 patch onto the skin daily. Remove & Discard patch within 12 hours or as directed by MD   lidocaine-prilocaine cream Commonly known as:  EMLA Apply to affected area once   loperamide 2 MG capsule Commonly known as:  IMODIUM Take by mouth as needed for diarrhea or loose stools.   magnesium oxide 400 MG tablet Commonly known as:  MAG-OX Take 400 mg by mouth daily.   metoprolol succinate 25 MG 24 hr tablet Commonly known as:  TOPROL-XL Take 25 mg by mouth daily.   mometasone-formoterol 100-5 MCG/ACT Aero Commonly known as:  DULERA Inhale 2 puffs into the lungs 2 (two) times daily.   MYRBETRIQ 50 MG Tb24 tablet Generic drug:  mirabegron ER Take 50 mg by mouth daily.   naloxone 4 MG/0.1ML Liqd nasal spray kit Commonly known as:  NARCAN Place 1 spray into the nose.   nitroGLYCERIN 0.4 MG SL tablet Commonly known as:  NITROSTAT Place 0.4 mg under the tongue every 5 (five) minutes as needed for chest pain.   ondansetron 8 MG tablet Commonly known as:  ZOFRAN Take 1 tablet (8 mg total) by mouth 2 (two) times daily as needed. Start on the third day after chemotherapy.   Oxycodone HCl 10 MG Tabs Take 10 mg by mouth 3 (three) times daily as needed (severe pain).   pantoprazole 40 MG tablet Commonly known as:  PROTONIX Take 40 mg by mouth daily.   polyethylene glycol packet Commonly known as:  MIRALAX / GLYCOLAX Take 17 g by mouth daily.   potassium chloride 10 MEQ tablet Commonly known as:  K-DUR,KLOR-CON Take 40 mEq by mouth daily.   prochlorperazine 10 MG  tablet Commonly known as:  COMPAZINE Take 1 tablet (10 mg total) by mouth every 6 (six) hours as needed (Nausea or vomiting).   Penelope OP Apply 2  drops to eye every 8 (eight) hours as needed (dry eyes).   senna-docusate 8.6-50 MG tablet Commonly known as:  Senokot-S Take 2 tablets by mouth at bedtime as needed for mild constipation.   traMADol 50 MG tablet Commonly known as:  ULTRAM Take 50 mg by mouth every 6 (six) hours as needed for moderate pain.   TUSSIN DM 10-100 MG/5ML liquid Generic drug:  Dextromethorphan-guaiFENesin Take 5 mLs by mouth every 6 (six) hours as needed (cough).   vitamin A & D ointment Apply 1 application topically as needed for dry skin.   vitamin B-12 1000 MCG tablet Commonly known as:  CYANOCOBALAMIN Take 1,000 mcg by mouth daily.         Diet and Activity recommendation: See Discharge Instructions above   Consults obtained -neurology, physical therapy   Major procedures and Radiology Reports - PLEASE review detailed and final reports for all details, in brief -     Dg Chest 2 View  Result Date: 05/30/2018 CLINICAL DATA:  Syncope. EXAM: CHEST - 2 VIEW COMPARISON:  Chest x-ray dated May 21, 2018. FINDINGS: Unchanged right chest wall port catheter. The heart size and mediastinal contours are within normal limits. Normal pulmonary vascularity. No focal consolidation, pleural effusion, or pneumothorax. IMPRESSION: No active cardiopulmonary disease. Electronically Signed   By: Titus Dubin M.D.   On: 05/30/2018 15:45   Dg Chest 2 View  Result Date: 05/21/2018 CLINICAL DATA:  SOB EXAM: CHEST - 2 VIEW COMPARISON:  PET-CT 01/28/2018.  Chest x-ray 10/13/2017. FINDINGS: PowerPort catheter with tip over superior vena cava. Heart size normal. Mild left base subsegmental atelectasis. No pleural effusion or pneumothorax. Prior lumbar spine fusion. IMPRESSION: 1.  PowerPort catheter with tip over cavoatrial junction. 2.  Mild left base  subsegmental atelectasis. Electronically Signed   By: Marcello Moores  Register   On: 05/21/2018 10:10   Dg Ribs Unilateral Left  Result Date: 05/21/2018 CLINICAL DATA:  78 year old male with a history of shortness of breath after a fall EXAM: LEFT RIBS - 2 VIEW COMPARISON:  None. FINDINGS: No fracture or other bone lesions are seen involving the ribs. Right-sided IJ port catheter. IMPRESSION: No acute displaced rib fracture. Electronically Signed   By: Corrie Mckusick D.O.   On: 05/21/2018 09:59   Dg Ribs Unilateral Right  Result Date: 05/21/2018 CLINICAL DATA:  Pt reports shortness of breath following a fall around midnight last night. Pt states that he has bilateral rib pain "all over" and a general soreness in his ribs. EXAM: RIGHT RIBS - 2 VIEW COMPARISON:  None. FINDINGS: No fracture or other bone lesions are seen involving the ribs. No pneumothorax. Stable right IJ power injectable for catheter. Degenerative spurring at the acromioclavicular joint. Lumbar fixation hardware partially visualized. IMPRESSION: Negative. Electronically Signed   By: Lucrezia Europe M.D.   On: 05/21/2018 10:01   Ct Head Wo Contrast  Result Date: 05/30/2018 CLINICAL DATA:  Found down. Abdominal pain. History of liver cancer and thrombocytopenia. EXAM: CT HEAD WITHOUT CONTRAST TECHNIQUE: Contiguous axial images were obtained from the base of the skull through the vertex without intravenous contrast. COMPARISON:  CT head 05/21/2018 and 12/16/2017. FINDINGS: Brain: There is no evidence of acute intracranial hemorrhage, mass lesion, brain edema or extra-axial fluid collection. There is stable atrophy with diffuse prominence of the ventricles and subarachnoid spaces. There are stable chronic small vessel ischemic changes in the periventricular and subcortical white matter. There is no CT evidence of acute cortical infarction. Vascular: Prominent intracranial vascular calcifications.  No hyperdense vessel identified. Skull: Negative for fracture  or focal lesion. Sinuses/Orbits: The visualized paranasal sinuses and mastoid air cells are clear. No orbital abnormalities are seen. Other: None. IMPRESSION: Stable examination without acute findings. Chronic atrophy and small vessel ischemic changes. Electronically Signed   By: Richardean Sale M.D.   On: 05/30/2018 14:30   Ct Head Wo Contrast  Result Date: 05/21/2018 CLINICAL DATA:  fall this morning, unsure of how he fell out of bed but hit his head on the floor, denies LOC, denies blood thinners. States hx of liver cancer and was at cancer center for blood work. Pt also c/o SOB. EXAM: CT HEAD WITHOUT CONTRAST TECHNIQUE: Contiguous axial images were obtained from the base of the skull through the vertex without intravenous contrast. COMPARISON:  12/16/2017 FINDINGS: Brain: Diffuse parenchymal atrophy. Patchy areas of hypoattenuation in deep and periventricular white matter bilaterally. Negative for acute intracranial hemorrhage, mass lesion, acute infarction, midline shift, or mass-effect. Acute infarct may be inapparent on noncontrast CT. Ventricles and sulci symmetric. Vascular: Atherosclerotic and physiologic intracranial calcifications. Skull: Normal. Negative for fracture or focal lesion. Sinuses/Orbits: No acute finding. Other: None IMPRESSION: 1. Negative for bleed or other acute intracranial process. 2. Atrophy and nonspecific white matter changes. Electronically Signed   By: Lucrezia Europe M.D.   On: 05/21/2018 10:03   Ct Abdomen Pelvis W Contrast  Result Date: 05/30/2018 CLINICAL DATA:  Initial evaluation for acute generalized abdominal pain. History of hepatocellular carcinoma. EXAM: CT ABDOMEN AND PELVIS WITH CONTRAST TECHNIQUE: Multidetector CT imaging of the abdomen and pelvis was performed using the standard protocol following bolus administration of intravenous contrast. CONTRAST:  137m OMNIPAQUE IOHEXOL 300 MG/ML  SOLN COMPARISON:  Recent MRI from 04/23/2018. FINDINGS: Lower chest:  Emphysematous changes noted. Superimposed mild scattered bibasilar atelectasis. Hepatobiliary: Large infiltrative mass lesion involving the lateral segment of the left hepatic lobe again seen, relatively similar measuring up to approximately 10 cm in size. Multiple additional hypodensities within the liver, consistent with metastatic disease. Findings relatively similar to prior MRI. Underlying hepatic cirrhosis noted. Gallbladder within normal limits. No biliary dilatation. Pancreas: Pancreas within normal limits. Spleen: Unremarkable. Adrenals/Urinary Tract: Adrenal glands within normal limits. Kidneys equal in size with symmetric enhancement. Scattered bilateral renal cysts noted, stable. Nonobstructive calculi present within the left kidney, largest of which present at the lower pole and measures 6 mm. On the right, there is a 5 mm stone positioned at the right UPJ without significant hydronephrosis (series 5, image 56). No focal enhancing renal mass. No hydroureter. Partially distended bladder within normal limits. Stomach/Bowel: Stomach decompressed without acute abnormality. No evidence for bowel obstruction. Appendix not discretely visualized. No acute inflammatory changes seen about the bowels. Vascular/Lymphatic: Advanced aorto bi-iliac atherosclerotic disease. Intra-abdominal aorta mildly ectatic measuring up to 2.5 cm in diameter. Mesenteric vessels patent proximally. 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 New carotic gastrohepatic adenopathy, largest of which measures 1.8 cm in size, compatible with nodal metastases. Additional scattered paraesophageal nodes measure up to 12 mm. Necrotic periaortic adenopathy noted, largest of which at the celiac nodal station measuring 16 mm in size. Reproductive: Prostate mildly enlarged measuring 5.6 cm in diameter. Other: Trace free fluid seen adjacent to the liver and spleen. No free intraperitoneal air. Musculoskeletal: No acute  osseous abnormality. No worrisome lytic or blastic osseous lesions. Prior posterior fusion at L2 through L5. Prior ORIF at the left hip. IMPRESSION: 1. 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 Multiple additional hypodense lesions scattered within both hepatic lobes compatible with metastatic disease. Changes grossly similar relative to recent MRI. 2. Necrotic lower paraesophageal, gastrohepatic, hepatoduodenal, and para-aortic nodal metastases, also similar. 3. 5 mm stone positioned at the right UPJ without significant hydronephrosis. Additional nonobstructive left renal nephrolithiasis as above. 4. Trace ascites within the upper abdomen. 5. Advanced aorto bi-iliac atherosclerotic disease Electronically Signed   By: Jeannine Boga M.D.   On: 05/30/2018 16:11   US Carotid Bilateral  Result Date: 05/30/2018 CLINICAL DATA:  78 year old who had a syncopal episode earlier today. Current history of hepatocellular carcinoma. EXAM: BILATERAL CAROTID DUPLEX ULTRASOUND TECHNIQUE: Pearline Cables scale imaging, color Doppler and duplex ultrasound were performed of bilateral carotid and vertebral arteries in the neck. COMPARISON:  None. FINDINGS: Criteria: Quantification of carotid stenosis is based on velocity parameters that correlate the residual internal carotid diameter with NASCET-based stenosis levels, using the diameter of the distal internal carotid lumen as the denominator for stenosis measurement. The following velocity measurements were obtained: RIGHT ICA: 95/15 cm/sec CCA: 93/8 cm/sec SYSTOLIC ICA/CCA RATIO:  1.5 ECA: 153/3 cm/sec LEFT ICA: 64/14 cm/sec CCA: 10/17 cm/sec SYSTOLIC ICA/CCA RATIO:  1.2 ECA: 110/10 cm/sec RIGHT CAROTID ARTERY: Calcified and noncalcified plaque involving the distal CCA extending into the carotid bulb and proximal ICA and ECA. Stenosis involving the origin  of the ECA. RIGHT VERTEBRAL ARTERY:  Normal antegrade flow. LEFT CAROTID ARTERY: Calcified and noncalcified plaque involving the distal CCA extending into the carotid bulb and proximal ICA and ECA. No visible stenoses. LEFT VERTEBRAL ARTERY:  Normal antegrade flow. IMPRESSION: 1. No evidence of hemodynamically significant stenosis involving either the RIGHT or LEFT CCA or ICA by Doppler criteria. 2. Stenosis involving the origin of the RIGHT ECA which is of doubtful clinical significance. 3. Antegrade flow in both vertebral arteries. Electronically Signed   By: Evangeline Dakin M.D.   On: 05/30/2018 17:24   Dg Knee Left Port  Result Date: 05/31/2018 CLINICAL DATA:  Fall with left knee pain and abrasion. EXAM: PORTABLE LEFT KNEE - 1-2 VIEW COMPARISON:  None. FINDINGS: Crescentic bony structures lateral to the lateral femoral condyle on the frontal view, likely heterotopic ossification from old injury. No suspected acute fracture. No joint effusion or joint narrowing. Osteopenia and atherosclerosis. IMPRESSION: Crescentic bony structures about the lateral femoral condyle, favor heterotopic ossification from old injury over an acute avulsion fracture. Recommend follow-up if there is focal tenderness in this area. Otherwise negative. Electronically Signed   By: Monte Fantasia M.D.   On: 05/31/2018 04:17    Micro Results     Recent Results (from the past 240 hour(s))  C difficile quick scan w PCR reflex     Status: None   Collection Time: 05/30/18  1:53 PM  Result Value Ref Range Status   C Diff antigen NEGATIVE NEGATIVE Final   C Diff toxin NEGATIVE NEGATIVE Final   C Diff interpretation No C. difficile detected.  Final    Comment: Performed at [facility], [address] [place], [place] [address]  Blood culture (routine x 2)     Status: None (Preliminary result)   Collection Time: 05/30/18  1:53 PM  Result Value Ref Range Status   Specimen Description BLOOD LAC  Final   Special  Requests   Final    BOTTLES DRAWN AEROBIC AND ANAEROBIC Blood Culture results may not be optimal  due to an excessive volume of blood received in culture bottles   Culture   Final    NO GROWTH 2 DAYS Performed at [facility], [address] [place], [place] [address]    Report Status PENDING  Incomplete  Blood culture (routine x 2)     Status: None (Preliminary result)   Collection Time: 05/30/18  6:49 PM  Result Value Ref Range Status   Specimen Description BLOOD LEFT HAND  Final   Special Requests   Final    BOTTLES DRAWN AEROBIC ONLY Blood Culture results may not be optimal due to an excessive volume of blood received in culture bottles   Culture   Final    NO GROWTH 2 DAYS Performed at [facility], [address] [place], [place] [address]    Report Status PENDING  Incomplete  Gastrointestinal Panel by PCR , Stool     Status: None   Collection Time: 05/30/18 11:19 PM  Result Value Ref Range Status   Campylobacter species NOT DETECTED NOT DETECTED Final   Plesimonas shigelloides NOT DETECTED NOT DETECTED Final   Salmonella species NOT DETECTED NOT DETECTED Final   Yersinia enterocolitica NOT DETECTED NOT DETECTED Final   Vibrio species NOT DETECTED NOT DETECTED Final   Vibrio cholerae NOT DETECTED NOT DETECTED Final   Enteroaggregative E coli (EAEC) NOT DETECTED NOT DETECTED Final   Enteropathogenic E coli (EPEC) NOT DETECTED NOT DETECTED Final   Enterotoxigenic E coli (ETEC) NOT DETECTED NOT DETECTED Final   Shiga like toxin producing E coli (STEC) NOT DETECTED NOT DETECTED Final   Shigella/Enteroinvasive E coli (EIEC) NOT DETECTED NOT DETECTED Final   Cryptosporidium NOT DETECTED NOT DETECTED Final   Cyclospora cayetanensis NOT DETECTED NOT DETECTED Final   Entamoeba histolytica NOT DETECTED NOT DETECTED Final   Giardia lamblia NOT DETECTED NOT DETECTED Final   Adenovirus F40/41 NOT DETECTED NOT DETECTED Final   Astrovirus NOT DETECTED NOT DETECTED  Final   Norovirus GI/GII NOT DETECTED NOT DETECTED Final   Rotavirus A NOT DETECTED NOT DETECTED Final   Sapovirus (I, II, IV, and V) NOT DETECTED NOT DETECTED Final    Comment: Performed at [facility], [address] [place], [place] [address]       Today   Subjective:   Alarik Radu today has no headache,no chest abdominal pain,no new weakness tingling or numbness, feels much better wants to go home today.   Objective:   Blood pressure 137/76, pulse 100, temperature 97.9 F (36.6 C), temperature source Oral, resp. rate 20, height '5\' 8"'  (1.727 m), weight 81.6 kg, SpO2 92 %.   Intake/Output Summary (Last 24 hours) at 06/01/2018 1041 Last data filed at 06/01/2018 1007 Gross per 24 hour  Intake 240 ml  Output 720 ml  Net -480 ml    Exam Awake Alert, Oriented x 3, No new F.N deficits, Normal affect [place].AT,PERRAL Supple Neck,No JVD, No cervical lymphadenopathy appriciated.  Symmetrical Chest wall movement, Good air movement bilaterally, CTAB RRR,No Gallops,Rubs or new Murmurs, No Parasternal Heave +ve B.Sounds, Abd Soft, Non tender, No organomegaly appriciated, No rebound -guarding or rigidity. No Cyanosis, Clubbing or edema, No new Rash or bruise  Data Review   CBC w Diff:  Lab Results  Component Value Date   WBC 7.4 05/31/2018   HGB 12.6 (L) 05/31/2018   HGB 11.2 (L) 12/09/2013   HCT 38.4 (L) 05/31/2018   HCT 35.9 (L) 12/09/2013   PLT 184 05/31/2018   PLT 221  12/09/2013   LYMPHOPCT 18 05/28/2018   LYMPHOPCT 22.1 12/09/2013   MONOPCT 10 05/28/2018   MONOPCT 13.2 12/09/2013   EOSPCT 0 05/28/2018   EOSPCT 1.1 12/09/2013   BASOPCT 1 05/28/2018   BASOPCT 0.8 12/09/2013    CMP:  Lab Results  Component Value Date   NA 138 05/31/2018   NA 138 12/08/2013   K 3.6 05/31/2018   K 3.9 12/08/2013   CL 101 05/31/2018   CL 103 12/08/2013   CO2 29 05/31/2018   CO2 27 12/08/2013   BUN 20 05/31/2018   BUN 12 12/08/2013   CREATININE 0.89 05/31/2018    CREATININE 1.16 12/08/2013   PROT 6.1 (L) 05/30/2018   PROT 6.9 12/07/2013   ALBUMIN 3.1 (L) 05/30/2018   ALBUMIN 3.1 (L) 12/07/2013   BILITOT 0.7 05/30/2018   BILITOT 0.7 12/07/2013   ALKPHOS 128 (H) 05/30/2018   ALKPHOS 57 12/07/2013   AST 31 05/30/2018   AST 23 12/07/2013   ALT 24 05/30/2018   ALT 28 12/07/2013  .   Total Time in preparing paper work, data evaluation and todays exam - 35 minutes  Epifanio Lesches M.D on 06/01/2018 at 10:41 AM    Note: This dictation was prepared with Dragon dictation along with smaller phrase technology. Any transcriptional errors that result from this process are unintentional.

## 2018-06-01 NOTE — Progress Notes (Signed)
Pt discharged home at this time via w/c with [facility] personal. Alert/ no distress. Sl d/cd. And port deaccessed  Per protocol with good blood return.  Instructions discussed with pt. No new presc.  meds for home discussed.  Diet /activity and f/u discussed. Spoke with [facility]  Rep. On phone earlier and they will see pt. Pt verbalized understanding.

## 2018-06-01 NOTE — Plan of Care (Signed)
  Problem: Education: Goal: Knowledge of General Education information will improve Description Including pain rating scale, medication(s)/side effects and non-pharmacologic comfort measures Outcome: Progressing   Problem: Health Behavior/Discharge Planning: Goal: Ability to manage health-related needs will improve Outcome: Progressing   Problem: Clinical Measurements: Goal: Ability to maintain clinical measurements within normal limits will improve Outcome: Progressing Goal: Will remain free from infection Outcome: Progressing Goal: Diagnostic test results will improve Outcome: Progressing Goal: Respiratory complications will improve Outcome: Progressing Goal: Cardiovascular complication will be avoided Outcome: Progressing   Problem: Activity: Goal: Risk for activity intolerance will decrease Outcome: Progressing   Problem: Nutrition: Goal: Adequate nutrition will be maintained Outcome: Progressing   Problem: Coping: Goal: Level of anxiety will decrease Outcome: Progressing   Problem: Elimination: Goal: Will not experience complications related to bowel motility Outcome: Progressing Goal: Will not experience complications related to urinary retention Outcome: Progressing   Problem: Pain Managment: Goal: General experience of comfort will improve Outcome: Progressing   Problem: Safety: Goal: Ability to remain free from injury will improve Outcome: Progressing   Problem: Skin Integrity: Goal: Risk for impaired skin integrity will decrease Outcome: Progressing  [facility] follows pt

## 2018-06-02 ENCOUNTER — Encounter: Payer: Self-pay | Admitting: Urgent Care

## 2018-06-02 NOTE — Progress Notes (Signed)
[place]  06/02/18  Name: Jimmy Hill. MRN: [id number] DOB: 20-Sep-1940  Re: Change in plan of care  Received communication from patient's PCP Meredith Staggers, MD) on the morning of 06/02/2018 (see below) indicating that patient has elected to forego any further chemotherapy treatments.  Patient has opted to transition to hospice services for end-of-life care.     Plan: 1. Cancel all scheduled appointments. 2. Communicate with PCP to advise him to call the clinic should patient change his mind in the future and desire treatment. 3. Update primary attending oncologist Mike Gip, MD) on patient's decision to stop treatment.  Honor Loh, MSN, APRN, FNP-C, CEN Oncology/Hematology Nurse Practitioner  [facility] 06/02/18, 9:07 AM

## 2018-06-04 ENCOUNTER — Ambulatory Visit: Payer: No Typology Code available for payment source

## 2018-06-04 ENCOUNTER — Other Ambulatory Visit: Payer: No Typology Code available for payment source

## 2018-06-04 ENCOUNTER — Ambulatory Visit: Payer: No Typology Code available for payment source | Admitting: Hematology and Oncology

## 2018-06-04 LAB — CULTURE, BLOOD (ROUTINE X 2)
Culture: NO GROWTH
Culture: NO GROWTH

## 2018-06-17 ENCOUNTER — Encounter (INDEPENDENT_AMBULATORY_CARE_PROVIDER_SITE_OTHER): Payer: Self-pay

## 2018-06-22 ENCOUNTER — Other Ambulatory Visit (INDEPENDENT_AMBULATORY_CARE_PROVIDER_SITE_OTHER): Payer: Self-pay | Admitting: Nurse Practitioner

## 2018-06-22 MED ORDER — DEXTROSE 5 % IV SOLN
2.0000 g | Freq: Once | INTRAVENOUS | Status: DC
  Filled 2018-06-22: qty 20

## 2018-06-23 ENCOUNTER — Encounter: Payer: Self-pay | Admitting: Vascular Surgery

## 2018-06-23 ENCOUNTER — Ambulatory Visit
Admission: RE | Admit: 2018-06-23 | Discharge: 2018-06-23 | Disposition: A | Discharge status: Home / Self Care | Payer: Medicare (Managed Care) | Source: Ambulatory Visit | Attending: Vascular Surgery | Admitting: Vascular Surgery

## 2018-06-23 ENCOUNTER — Encounter
Admission: RE | Disposition: A | Discharge status: Home / Self Care | Payer: Self-pay | Source: Ambulatory Visit | Attending: Vascular Surgery

## 2018-06-23 DIAGNOSIS — Z452 Encounter for adjustment and management of vascular access device: Secondary | ICD-10-CM | POA: Insufficient documentation

## 2018-06-23 DIAGNOSIS — C221 Intrahepatic bile duct carcinoma: Secondary | ICD-10-CM

## 2018-06-23 HISTORY — PX: PORTA CATH REMOVAL: CATH118286

## 2018-06-23 SURGERY — PORTA CATH REMOVAL
Anesthesia: Moderate Sedation

## 2018-06-23 MED ORDER — CEFAZOLIN SODIUM-DEXTROSE 2-4 GM/100ML-% IV SOLN
INTRAVENOUS | Status: AC
  Filled 2018-06-23: qty 100

## 2018-06-23 MED ORDER — HYDROMORPHONE HCL 1 MG/ML IJ SOLN: 1.0000 mg | Freq: Once | INTRAMUSCULAR | Status: DC | PRN

## 2018-06-23 MED ORDER — ONDANSETRON HCL 4 MG/2ML IJ SOLN: 4.0000 mg | Freq: Four times a day (QID) | INTRAMUSCULAR | Status: DC | PRN

## 2018-06-23 MED ORDER — LIDOCAINE-EPINEPHRINE (PF) 1 %-1:200000 IJ SOLN
INTRAMUSCULAR | Status: AC
  Filled 2018-06-23: qty 30

## 2018-06-23 MED ORDER — MIDAZOLAM HCL 2 MG/2ML IJ SOLN
INTRAMUSCULAR | Status: AC
  Filled 2018-06-23: qty 2

## 2018-06-23 MED ORDER — MIDAZOLAM HCL 2 MG/ML PO SYRP: 8.0000 mg | ORAL_SOLUTION | Freq: Once | ORAL | Status: DC | PRN

## 2018-06-23 MED ORDER — MIDAZOLAM HCL 2 MG/2ML IJ SOLN
INTRAMUSCULAR | Status: DC | PRN
  Administered 2018-06-23: 1 mg via INTRAVENOUS

## 2018-06-23 MED ORDER — FENTANYL CITRATE (PF) 100 MCG/2ML IJ SOLN
INTRAMUSCULAR | Status: DC | PRN
  Administered 2018-06-23: 50 ug via INTRAVENOUS

## 2018-06-23 MED ORDER — SODIUM CHLORIDE 0.9 % IV SOLN
Freq: Once | INTRAVENOUS | Status: DC
  Filled 2018-06-23: qty 2

## 2018-06-23 MED ORDER — SODIUM CHLORIDE 0.9 % IV SOLN
INTRAVENOUS | Status: DC
  Administered 2018-06-23: 09:00:00 via INTRAVENOUS

## 2018-06-23 MED ORDER — FENTANYL CITRATE (PF) 100 MCG/2ML IJ SOLN
INTRAMUSCULAR | Status: AC
  Filled 2018-06-23: qty 2

## 2018-06-23 MED ORDER — METHYLPREDNISOLONE SODIUM SUCC 125 MG IJ SOLR: 125.0000 mg | Freq: Once | INTRAMUSCULAR | Status: DC | PRN

## 2018-06-23 MED ORDER — DIPHENHYDRAMINE HCL 50 MG/ML IJ SOLN: 50.0000 mg | Freq: Once | INTRAMUSCULAR | Status: DC | PRN

## 2018-06-23 MED ORDER — LIDOCAINE-EPINEPHRINE (PF) 1 %-1:200000 IJ SOLN
INTRAMUSCULAR | Status: DC | PRN
  Administered 2018-06-23: 20 mL

## 2018-06-23 MED ORDER — FAMOTIDINE 20 MG PO TABS: 40.0000 mg | ORAL_TABLET | Freq: Once | ORAL | Status: DC | PRN

## 2018-06-23 SURGICAL SUPPLY — 6 items
DERMABOND ADVANCED (GAUZE/BANDAGES/DRESSINGS) ×2
DERMABOND ADVANCED .7 DNX12 (GAUZE/BANDAGES/DRESSINGS) ×1 IMPLANT
ELECT REM PT RETURN 9FT ADLT (ELECTROSURGICAL) ×3
ELECTRODE REM PT RTRN 9FT ADLT (ELECTROSURGICAL) ×1 IMPLANT
PACK ANGIOGRAPHY (CUSTOM PROCEDURE TRAY) ×3 IMPLANT
PENCIL ELECTRO HAND CTR (MISCELLANEOUS) ×3 IMPLANT

## 2018-06-23 NOTE — Op Note (Signed)
[facility]       Operative Note  Date: 06/23/2018  Preoperative diagnosis:  1. 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 Removal of right jugular port a cath   Surgeon: Leotis Pain, MD  Anesthesia: Local with moderate conscious sedation for approximately 15 minutes using 1 mg of Versed and 50 mcg of Fentanyl  Fluoroscopy time: none  Contrast used: 0  Estimated blood loss: Minimal  Indication for the procedure:  The patient is a 78 y.o. male who has a history of cholangiocarcinoma and no longer needs their Port-A-Cath. The patient desires to have this removed. Risks and benefits including need for potential replacement with recurrent disease were discussed and patient is agreeable to proceed.  Description of procedure: The patient was brought to the vascular and interventional radiology suite. Moderate conscious sedation was administered during a face to face encounter with the patient throughout the procedure with my supervision of the RN administering medicines and monitoring the patient's vital signs, pulse oximetry, telemetry and mental status throughout from the start of the procedure until the patient was taken to the recovery room.  The right neck chest and shoulder were sterilely prepped and draped, and a sterile surgical field was created. The area was then anesthetized with 1% lidocaine copiously. The previous incision was reopened and electrocautery used to dissected down to the port and the catheter. These were dissected free and the catheter was gently removed from the vein in its entirety. The port was dissected out from the fibrous connective tissue and the Prolene sutures were removed. The port was then removed in its entirety including the catheter. The wound was then closed with a 3-0 Vicryl and a 4-0 Monocryl and Dermabond was placed  as a dressing. The patient was then taken to the recovery room in stable condition having tolerated the procedure well.  Complications: none  Condition: stable   Leotis Pain, MD 06/23/2018 9:45 AM   This note was created with Dragon Medical transcription system. Any errors in dictation are purely unintentional.

## 2018-06-23 NOTE — Discharge Instructions (Signed)
Wound Care, Adult  Taking care of your wound properly can help to prevent pain, infection, and scarring. It can also help your wound to heal more quickly.  How to care for your wound  Wound care          Follow instructions from your health care provider about how to take care of your wound. Make sure you:  ? Wash your hands with soap and water before you change the bandage (dressing). If soap and water are not available, use hand sanitizer.  ? Change your dressing as told by your health care provider.  ? Leave stitches (sutures), skin glue, or adhesive strips in place. These skin closures may need to stay in place for 2 weeks or longer. If adhesive strip edges start to loosen and curl up, you may trim the loose edges. Do not remove adhesive strips completely unless your health care provider tells you to do that.   Check your wound area every day for signs of infection. Check for:  ? Redness, swelling, or pain.  ? Fluid or blood.  ? Warmth.  ? Pus or a bad smell.   Ask your health care provider if you should clean the wound with mild soap and water. Doing this may include:  ? Using a clean towel to pat the wound dry after cleaning it. Do not rub or scrub the wound.  ? Applying a cream or ointment. Do this only as told by your health care provider.  ? Covering the incision with a clean dressing.   Ask your health care provider when you can leave the wound uncovered.   Keep the dressing dry until your health care provider says it can be removed. Do not take baths, swim, use a hot tub, or do anything that would put the wound underwater until your health care provider approves. Ask your health care provider if you can take showers. You may only be allowed to take sponge baths.  Medicines     If you were prescribed an antibiotic medicine, cream, or ointment, take or use the antibiotic as told by your health care provider. Do not stop taking or using the antibiotic even if your condition improves.   Take  over-the-counter and prescription medicines only as told by your health care provider. If you were prescribed pain medicine, take it 30 or more minutes before you do any wound care or as told by your health care provider.  General instructions   Return to your normal activities as told by your health care provider. Ask your health care provider what activities are safe.   Do not scratch or pick at the wound.   Do not use any products that contain nicotine or tobacco, such as cigarettes and e-cigarettes. These may delay wound healing. If you need help quitting, ask your health care provider.   Keep all follow-up visits as told by your health care provider. This is important.   Eat a diet that includes protein, vitamin A, vitamin C, and other nutrient-rich foods to help the wound heal.  ? Foods rich in protein include meat, dairy, beans, nuts, and other sources.  ? Foods rich in vitamin A include carrots and dark green, leafy vegetables.  ? Foods rich in vitamin C include citrus, tomatoes, and other fruits and vegetables.  ? Nutrient-rich foods have protein, carbohydrates, fat, vitamins, or minerals. Eat a variety of healthy foods including vegetables, fruits, and whole grains.  Contact a health care provider if:     You received a tetanus shot and you have swelling, severe pain, redness, or bleeding at the injection site.   Your pain is not controlled with medicine.   You have redness, swelling, or pain around the wound.   You have fluid or blood coming from the wound.   Your wound feels warm to the touch.   You have pus or a bad smell coming from the wound.   You have a fever or chills.   You are nauseous or you vomit.   You are dizzy.  Get help right away if:   You have a red streak going away from your wound.   The edges of the wound open up and separate.   Your wound is bleeding, and the bleeding does not stop with gentle pressure.   You have a rash.   You faint.   You have trouble  breathing.  Summary   Always wash your hands with soap and water before changing your bandage (dressing).   To help with healing, eat foods that are rich in protein, vitamin A, vitamin C, and other nutrients.   Check your wound every day for signs of infection. Contact your health care provider if you suspect that your wound is infected.  This information is not intended to replace advice given to you by your health care provider. Make sure you discuss any questions you have with your health care provider.  Document Released: 02/12/2008 Document Revised: 06/16/2017 Document Reviewed: 11/20/2015  Elsevier Interactive Patient Education  2019 [employer]

## 2018-06-23 NOTE — H&P (Signed)
[facility] History & Physical Update  The patient was interviewed and re-examined.  The patient's previous History and Physical has been reviewed and is unchanged.  There is no change in the plan of care. We plan to proceed with the scheduled procedure.  Leotis Pain, MD  06/23/2018, 9:05 AM

## 2018-06-24 ENCOUNTER — Inpatient Hospital Stay: Payer: Medicare (Managed Care)

## 2018-07-15 ENCOUNTER — Encounter: Payer: Self-pay | Admitting: Hematology and Oncology

## 2018-07-18 ENCOUNTER — Encounter: Payer: Self-pay | Admitting: Emergency Medicine

## 2018-07-18 ENCOUNTER — Emergency Department: Payer: Medicare (Managed Care)

## 2018-07-18 ENCOUNTER — Emergency Department
Admission: EM | Admit: 2018-07-18 | Discharge: 2018-07-18 | Disposition: A | Discharge status: Skilled Nursing Facility | Payer: Medicare (Managed Care) | Attending: Emergency Medicine | Admitting: Emergency Medicine

## 2018-07-18 DIAGNOSIS — J449 Chronic obstructive pulmonary disease, unspecified: Secondary | ICD-10-CM | POA: Insufficient documentation

## 2018-07-18 DIAGNOSIS — N183 Chronic kidney disease, stage 3 (moderate): Secondary | ICD-10-CM | POA: Diagnosis not present

## 2018-07-18 DIAGNOSIS — Z79899 Other long term (current) drug therapy: Secondary | ICD-10-CM | POA: Diagnosis not present

## 2018-07-18 DIAGNOSIS — I129 Hypertensive chronic kidney disease with stage 1 through stage 4 chronic kidney disease, or unspecified chronic kidney disease: Secondary | ICD-10-CM | POA: Insufficient documentation

## 2018-07-18 DIAGNOSIS — S0990XA Unspecified injury of head, initial encounter: Secondary | ICD-10-CM | POA: Insufficient documentation

## 2018-07-18 DIAGNOSIS — Z87891 Personal history of nicotine dependence: Secondary | ICD-10-CM | POA: Diagnosis not present

## 2018-07-18 DIAGNOSIS — Y998 Other external cause status: Secondary | ICD-10-CM | POA: Diagnosis not present

## 2018-07-18 DIAGNOSIS — W010XXA Fall on same level from slipping, tripping and stumbling without subsequent striking against object, initial encounter: Secondary | ICD-10-CM | POA: Insufficient documentation

## 2018-07-18 DIAGNOSIS — Y9389 Activity, other specified: Secondary | ICD-10-CM | POA: Diagnosis not present

## 2018-07-18 DIAGNOSIS — Y9289 Other specified places as the place of occurrence of the external cause: Secondary | ICD-10-CM | POA: Diagnosis not present

## 2018-07-18 DIAGNOSIS — W19XXXA Unspecified fall, initial encounter: Secondary | ICD-10-CM

## 2018-07-18 NOTE — ED Provider Notes (Signed)
[facility] Emergency Department Provider Note   ____________________________________________    I have reviewed the triage vital signs and the nursing notes.   HISTORY  Chief Complaint Fall     HPI Jimmy Hill. is a 78 y.o. male who presents after a fall.  Patient reports he lost his balance and fell forward and struck his head this morning.  He states he felt well and is joking about how he decided to test the hardness of the floor and did not want to come into the emergency department but was sent regardless.  He is part of the pace program.  He denies neuro deficits.  No nausea or vomiting.  No chest pain back pain or neck pain.  No lower extremity injuries besides some mild abrasions  Past Medical History:  Diagnosis Date  . Allergic rhinitis   . Anginal pain ([place])   . Arthritis   . Autonomic dysreflexia   . Back pain    CHRONIC LBP,STENOSIS  . CHF (congestive heart failure) ([place])   . Chronic kidney disease    STAGE 3  . COPD (chronic obstructive pulmonary disease) ([place])   . Coronary artery disease   . Depression   . Diabetes ([place])   . Diabetes mellitus without complication ([place])   . DJD (degenerative joint disease)   . Dysphagia   . Dysrhythmia    A-fib, now converted  . Edema    FEET/ANKLES  . GERD (gastroesophageal reflux disease)   . Glaucoma   . H/O orthostatic hypotension   . Hypercholesterolemia   . Myocardial infarction ([place]) 2000  . Parkinson's disease ([place])   . Peripheral vascular disease ([place])   . Pneumonia    history of   . Polyneuropathy   . RLS (restless legs syndrome)   . Seizures ([place])    when in rehab for his hip  . Sleep apnea   . Smoker   . Spinal stenosis   . Strabismic amblyopia   . Tinnitus     Patient Active Problem List   Diagnosis Date Noted  . Encounter for antineoplastic chemotherapy 04/30/2018  . Dehydration 04/30/2018  . 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 Cancer-related pain  03/18/2018  . Goals of care, counseling/discussion 03/18/2018  . Iron deficiency 03/02/2018  . Liver lesion 01/22/2018  . Acute exacerbation of chronic obstructive pulmonary disease (COPD) ([place]) 09/23/2017  . Sepsis ([place]) 05/01/2017  . Syncope 04/30/2017  . New onset atrial fibrillation ([place]) 07/03/2016  . Loss of weight   . Gastritis without bleeding   . Occult GI bleeding   . GI bleed 03/02/2016  . Left-sided weakness 03/02/2016  . Rectal bleeding 01/16/2016  . Colitis presumed infectious 01/10/2016  . Pressure ulcer 12/29/2015  . Altered mental status 12/28/2015  . Fracture of femoral neck, left ([place]) 12/03/2015  . CAD (coronary artery disease) 12/03/2015  . Sleep apnea 12/03/2015  . Femoral neck fracture, left, closed, initial encounter 12/03/2015  . Unstable angina ([place]) 05/14/2015  . COPD (chronic obstructive pulmonary disease) ([place]) 02/05/2015  . Acute bronchitis 02/05/2015  . Type 2 diabetes mellitus ([place]) 02/05/2015  . Chest pain 12/09/2014  . Presence of drug coated stent in LAD coronary artery 07/11/2013  . Disease characterized by destruction of skeletal muscle 09/12/2009  . Encephalopathy 09/12/2009  . Vitamin D deficiency 05/05/2008  . Polyneuropathy in diabetes ([place]) 08/17/2005  . Spinal stenosis in cervical region 03/20/2005  . Essential tremor 01/17/2005  . Hypercholesterolemia 03/21/2004  .  Hypertension, benign 01/08/2004  . 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 Localized osteoarthrosis, lower leg 04/07/2002  . Parkinson's disease ([place]) 09/21/2000  . Peptic ulcer with hemorrhage and perforation ([place]) 04/19/1996  . 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 ANKLE SURGERY    . APPENDECTOMY    . BACK SURGERY    . CATARACT EXTRACTION W/PHACO Left 08/26/2016   Procedure: CATARACT EXTRACTION PHACO AND INTRAOCULAR LENS PLACEMENT (IOC);  Surgeon: Birder Robson, MD;  Location: [facility] ORS;  Service:  Ophthalmology;  Laterality: Left;  [place] 01:37 AP% 19.4 CDE 18.97 fluid pack lot # [id number]  . CORONARY ANGIOPLASTY     STENT  . CTR    . ESOPHAGOGASTRODUODENOSCOPY (EGD) WITH PROPOFOL N/A 03/04/2016   Procedure: ESOPHAGOGASTRODUODENOSCOPY (EGD) WITH PROPOFOL;  Surgeon: [name], MD;  Location: [facility] ENDOSCOPY;  Service: Endoscopy;  Laterality: N/A;  . EYE SURGERY     cataract  . FRACTURE SURGERY    . HIP PINNING,CANNULATED Left 12/04/2015   Procedure: CANNULATED HIP PINNING;  Surgeon: Thornton Park, MD;  Location: [facility] ORS;  Service: Orthopedics;  Laterality: Left;  . PORTA CATH INSERTION N/A 04/28/2018   Procedure: PORTA CATH INSERTION;  Surgeon: Katha Cabal, MD;  Location: [place] [facility];  Service: Cardiovascular;  Laterality: N/A;  . PORTA CATH REMOVAL N/A 06/23/2018   Procedure: PORTA CATH REMOVAL;  Surgeon: Algernon Huxley, MD;  Location: [place] [facility];  Service: Cardiovascular;  Laterality: N/A;    Prior to Admission medications   Medication Sig Start Date End Date Taking? Authorizing Provider  acetaminophen (TYLENOL) 325 MG tablet Take 650 mg by mouth every 6 (six) hours as needed for mild pain or fever.     [provider]  albuterol (PROVENTIL HFA;VENTOLIN HFA) 108 (90 Base) MCG/ACT inhaler Inhale into the lungs every 6 (six) hours as needed for wheezing or shortness of breath.    [provider]  albuterol (PROVENTIL) (2.5 MG/3ML) 0.083% nebulizer solution Take 2.5 mg by nebulization 4 (four) times daily as needed for wheezing or shortness of breath.    [provider]  amiodarone (PACERONE) 100 MG tablet Take 100 mg by mouth daily.    [provider]  atorvastatin (LIPITOR) 80 MG tablet Take 80 mg by mouth at bedtime.    [provider]  brimonidine-timolol (COMBIGAN) 0.2-0.5 % ophthalmic solution Place 1 drop into both eyes 2 (two) times daily.    [provider]  Calcium Carbonate-Vitamin D (CALCIUM 600+D  PO) Take 1 tablet by mouth daily.    [provider]  carbidopa-levodopa (SINEMET IR) 25-250 MG tablet Take 2 tablets by mouth every morning (8AM), 1 tablet at noon, 1 tablet every afternoon (430PM) and 1 tablet at bedtime (10PM)    [provider]  Carboxymethylcellul-Glycerin (REFRESH OPTIVE OP) Apply 2 drops to eye every 8 (eight) hours as needed (dry eyes).    [provider]  dexamethasone (DECADRON) 4 MG tablet Take 2 tablets by mouth once a day on the day after chemotherapy and then take 1 tablet two times a day for 2 days. Take with food. 04/27/18   Lequita Asal, MD  Dextromethorphan-Guaifenesin (TUSSIN DM) 10-100 MG/5ML liquid Take 5 mLs by mouth every 6 (six) hours as needed (cough).     [provider]  dextrose (GLUTOSE 15) 40 % GEL Take 1 Tube by mouth. For blood sugar less than 70, repeat in 5 minutes up  to 2 doses     [provider]  fentaNYL (DURAGESIC) 25 MCG/HR Place onto the skin every 3 (three) days.    [provider]  ferrous sulfate 325 (65 FE) MG tablet Take 325 mg by mouth daily with breakfast.    [provider]  fludrocortisone (FLORINEF) 0.1 MG tablet Take 3 tablets (0.3 mg total) by mouth daily. 06/01/18   Epifanio Lesches, MD  ibuprofen (ADVIL,MOTRIN) 200 MG tablet Take 400 mg by mouth every 6 (six) hours as needed for fever or mild pain.     [provider]  insulin glargine (LANTUS) 100 UNIT/ML injection Inject 20 Units into the skin every morning.     Gareth Morgan, MD  isosorbide mononitrate (ISMO,MONOKET) 10 MG tablet Take 5 mg by mouth at bedtime.     [provider]  Lenvatinib 12 mg daily dose (LENVIMA) 3 x 4 MG capsule Take 12 mg by mouth daily. 03/23/18   Karen Kitchens, NP  levETIRAcetam (KEPPRA) 500 MG tablet Take 500 mg by mouth 2 (two) times daily.    [provider]  lidocaine (LIDODERM) 5 % Place 1 patch onto the skin daily. Remove & Discard patch within 12  hours or as directed by MD    [provider]  lidocaine-prilocaine (EMLA) cream Apply to affected area once 04/27/18   Lequita Asal, MD  loperamide (IMODIUM) 2 MG capsule Take by mouth as needed for diarrhea or loose stools.    [provider]  magnesium oxide (MAG-OX) 400 MG tablet Take 400 mg by mouth daily.    [provider]  metoprolol succinate (TOPROL-XL) 25 MG 24 hr tablet Take 25 mg by mouth daily.    [provider]  mirabegron ER (MYRBETRIQ) 50 MG TB24 tablet Take 50 mg by mouth daily.    [provider]  mometasone-formoterol (DULERA) 100-5 MCG/ACT AERO Inhale 2 puffs into the lungs 2 (two) times daily.    [provider]  naloxone [facility]) nasal spray 4 mg/0.1 mL Place 1 spray into the nose.    [provider]  nitroGLYCERIN (NITROSTAT) 0.4 MG SL tablet Place 0.4 mg under the tongue every 5 (five) minutes as needed for chest pain.    [provider]  ondansetron (ZOFRAN) 8 MG tablet Take 1 tablet (8 mg total) by mouth 2 (two) times daily as needed. Start on the third day after chemotherapy. 04/27/18   Lequita Asal, MD  Oxycodone HCl 10 MG TABS Take 10 mg by mouth 3 (three) times daily as needed (severe pain).     [provider]  pantoprazole (PROTONIX) 40 MG tablet Take 40 mg by mouth daily.    [provider]  polyethylene glycol (MIRALAX / GLYCOLAX) packet Take 17 g by mouth daily.    [provider]  potassium chloride (K-DUR,KLOR-CON) 10 MEQ tablet Take 40 mEq by mouth daily.     [provider]  prochlorperazine (COMPAZINE) 10 MG tablet Take 1 tablet (10 mg total) by mouth every 6 (six) hours as needed (Nausea or vomiting). 04/27/18   Lequita Asal, MD  senna-docusate (SENOKOT-S) 8.6-50 MG tablet Take 2 tablets by mouth at bedtime as needed for mild constipation.     [provider]  traMADol (ULTRAM) 50 MG tablet Take 50 mg by mouth every 6  (six) hours as needed for moderate pain.    [provider]  vitamin B-12 (CYANOCOBALAMIN) 1000 MCG tablet Take 1,000 mcg by  mouth daily.    [provider]  Vitamins A & D (VITAMIN A & D) ointment Apply 1 application topically as needed for dry skin.    [provider]     Allergies Bupropion; Bactrim [sulfamethoxazole-trimethoprim]; and Cheese  Family History  Problem Relation Age of Onset  . Heart disease Father   . Diabetes Mellitus II Other     Social History Social History   Tobacco Use  . Smoking status: Former Smoker    Packs/day: 0.50    Types: Cigarettes    Last attempt to quit: 11/17/2015    Years since quitting: 2.6  . Smokeless tobacco: Never Used  Substance Use Topics  . Alcohol use: No  . Drug use: No    Review of Systems  Constitutional: No fever/chills Eyes: No visual changes.  ENT: No neck pain Cardiovascular: Denies chest pain. Respiratory: Denies shortness of breath. Gastrointestinal: No abdominal pain.    Genitourinary: No groin injury Musculoskeletal: Negative for back pain. Skin: Abrasions Neurological: Negative for headaches, no weakness   ____________________________________________   PHYSICAL EXAM:  VITAL SIGNS: ED Triage Vitals  Enc Vitals Group     BP 07/18/18 0909 108/78     Pulse Rate 07/18/18 0909 86     Resp 07/18/18 0909 18     Temp 07/18/18 0909 97.6 F (36.4 C)     Temp src --      SpO2 07/18/18 0909 97 %     Weight 07/18/18 0910 90.7 kg (200 lb)     Height 07/18/18 0910 1.727 m (5\' 8" )     Head Circumference --      Peak Flow --      Pain Score 07/18/18 0910 8     Pain Loc --      Pain Edu? --      Excl. in [place]? --     Constitutional: Alert and oriented. No acute distress Eyes: Conjunctivae are normal.  Head: Hematoma to the forehead, shallow laceration bleeding controlled Nose: No swelling or epistaxis Mouth/Throat: Mucous membranes are moist.   Neck:  Painless ROM no vertebral  tenderness palpation Cardiovascular: Normal rate, regular rhythm. Grossly normal heart sounds.  Good peripheral circulation. Respiratory: Normal respiratory effort.  No retractions. Lungs CTAB. Gastrointestinal: Soft and nontender. No distention.  Genitourinary: deferred Musculoskeletal: Back: No vertebral tenderness palpation, full range of motion of all extremities.   Warm and well perfused. abrasions to the left knee mild Neurologic:  Normal speech and language. No gross focal neurologic deficits are appreciated.  Skin:  Skin is warm, dry, abrasions and lacerations as above Psychiatric: Mood and affect are normal. Speech and behavior are normal.  ____________________________________________   LABS (all labs ordered are listed, but only abnormal results are displayed)  Labs Reviewed - No data to display ____________________________________________  EKG  None ____________________________________________  RADIOLOGY  CT head unremarkable ____________________________________________   PROCEDURES  Procedure(s) performed: No  Procedures   Critical Care performed: No ____________________________________________   INITIAL IMPRESSION / ASSESSMENT AND PLAN / ED COURSE  Pertinent labs & imaging results that were available during my care of the patient were reviewed by me and considered in my medical decision making (see chart for details).  Well-appearing and in no acute distress, hematoma to the forehead, CT imaging is quite reassuring, otherwise minor abrasions and lacerations no need for suturing.  Wounds dressed and patient discharged back to pace program, discussed with patient's pace physician who agrees with plan    ____________________________________________  FINAL CLINICAL IMPRESSION(S) / ED DIAGNOSES  Final diagnoses:  Fall, initial encounter  Injury of head, initial encounter        Note:  This document was prepared using Dragon voice recognition  software and may include unintentional dictation errors.   Lavonia Drafts, MD 07/18/18 [id number]

## 2018-07-18 NOTE — ED Notes (Signed)
Report given to Jessica RN

## 2018-07-18 NOTE — ED Notes (Signed)
Bandaid placed to abrasion of left knee.  [facility] called to assist with transport home, awaiting callback at this time.

## 2018-07-18 NOTE — ED Triage Notes (Signed)
PT to ER with c/o slipping and falling in [place].  Pt has had multiple falls recently.  EMS reports that pt's BP is "usually high" and that their initial BP was 44W systolic.  Pt denies dizziness, lightheadedness, nausea or LOC prior to fall or now.  Pt hit head on a railing, noted hematoma to left forehead.  Pt with abrasions to bilateral knees.

## 2018-10-14 ENCOUNTER — Encounter: Payer: Self-pay | Admitting: Hematology and Oncology

## 2018-10-18 DEATH — deceased

## 2020-01-26 IMAGING — CT NM PET TUM IMG INITIAL (PI) SKULL BASE T - THIGH
1 of 9 series · 1 of 25 positions shown · non-contrast
Comparison: MR abdomen 01/19/2018 and CT abdomen pelvis 05/02/2017.

CLINICAL DATA: Initial treatment strategy for liver lesion.

EXAM:
NUCLEAR MEDICINE PET SKULL BASE TO THIGH
TECHNIQUE: 10.2 mCi F-18 FDG was injected intravenously. Full-ring PET imaging
was performed from the skull base to thigh after the radiotracer. CT
data was obtained and used for attenuation correction and anatomic
localization.
Fasting blood glucose: 132 mg/dl

[Series 3: ct wb 5.0 b30f · axial · 5.0mm · 0.98mm/px · 1 of 329 slices shown]
[im 329/329  brain]
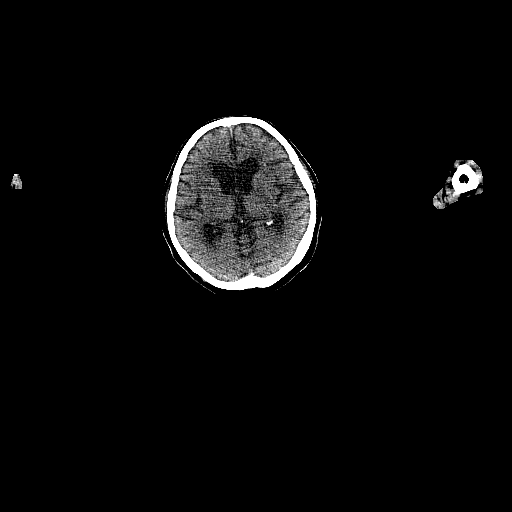

[1 of 25 positions shown; findings below may reference images not displayed]

FINDINGS: Mediastinal blood pool activity: SUV max

NECK: No abnormal hypermetabolism.

Incidental CT findings: None.

CHEST: No abnormal hypermetabolism. Lymph nodes adjacent to the
distal esophagus and at the diaphragmatic hiatus do not show
metabolism above blood pool.

Incidental CT findings: Atherosclerotic calcification of the
arterial vasculature, including three-vessel involvement of the
coronary arteries and aortic valve. Heart is at the upper limits of
normal in size. No pericardial or pleural effusion.

ABDOMEN/PELVIS: There is a large area ill-defined heterogeneous
low-attenuation in the left hepatic lobe, difficult to measure
without IV contrast, with a corresponding SUV max of 7.0.
Hypermetabolic periportal and gastro hepatic ligament lymph nodes
measure up to 1.5 cm and SUV max 4.3. No abnormal hypermetabolism in
the adrenal glands, spleen or pancreas.

Incidental CT findings: Liver is otherwise unremarkable. There may
be sludge in the gallbladder. Adrenal glands are unremarkable.
Stones in the kidneys bilaterally. 4.8 cm low-attenuation lesion off
the interpolar right kidney, characterized as a cyst on 01/19/2018.
Additional smaller low-attenuation lesions are too small to
characterize. Spleen, pancreas, stomach and bowel are otherwise
grossly unremarkable. No free fluid. Atherosclerotic calcification
of the arterial vasculature without abdominal aortic aneurysm.

SKELETON: No abnormal osseous hypermetabolism.

Incidental CT findings: Postoperative changes in the proximal left
femur and lumbar spine. Degenerative changes in the spine.
IMPRESSION: 1. Hypermetabolism associated with an ill-defined left hepatic lobe
mass as well as periportal/gastrohepatic ligament adenopathy.
Findings are consistent with malignancy, likely hepatic in origin
given no additional sites of abnormal hypermetabolism in the neck,
chest, abdomen or pelvis.
2. Aortic atherosclerosis ([id number]-170.0). Three-vessel coronary
artery calcification.
3. Bilateral renal stones.

## 2023-01-27 ENCOUNTER — Other Ambulatory Visit: Payer: Self-pay
